# Patient Record
Sex: Male | Born: 1940 | Race: White | Hispanic: No | Marital: Married | Smoking: Former smoker
Health system: Southern US, Community
[De-identification: ages and names within clinical notes are randomized; demographics above are authoritative.]

## PROBLEM LIST (undated history)

## (undated) DIAGNOSIS — K760 Fatty (change of) liver, not elsewhere classified: Secondary | ICD-10-CM

## (undated) DIAGNOSIS — M47814 Spondylosis without myelopathy or radiculopathy, thoracic region: Secondary | ICD-10-CM

## (undated) DIAGNOSIS — D649 Anemia, unspecified: Secondary | ICD-10-CM

## (undated) DIAGNOSIS — R233 Spontaneous ecchymoses: Secondary | ICD-10-CM

## (undated) DIAGNOSIS — Z923 Personal history of irradiation: Secondary | ICD-10-CM

## (undated) DIAGNOSIS — N200 Calculus of kidney: Secondary | ICD-10-CM

## (undated) DIAGNOSIS — Z460 Encounter for fitting and adjustment of spectacles and contact lenses: Secondary | ICD-10-CM

## (undated) DIAGNOSIS — J449 Chronic obstructive pulmonary disease, unspecified: Secondary | ICD-10-CM

## (undated) DIAGNOSIS — G620 Drug-induced polyneuropathy: Secondary | ICD-10-CM

## (undated) DIAGNOSIS — N529 Male erectile dysfunction, unspecified: Secondary | ICD-10-CM

## (undated) DIAGNOSIS — R911 Solitary pulmonary nodule: Secondary | ICD-10-CM

## (undated) DIAGNOSIS — K625 Hemorrhage of anus and rectum: Secondary | ICD-10-CM

## (undated) DIAGNOSIS — R0602 Shortness of breath: Secondary | ICD-10-CM

## (undated) DIAGNOSIS — R238 Other skin changes: Secondary | ICD-10-CM

## (undated) DIAGNOSIS — E785 Hyperlipidemia, unspecified: Secondary | ICD-10-CM

## (undated) DIAGNOSIS — K5790 Diverticulosis of intestine, part unspecified, without perforation or abscess without bleeding: Secondary | ICD-10-CM

## (undated) DIAGNOSIS — Z86010 Personal history of colonic polyps: Secondary | ICD-10-CM

## (undated) DIAGNOSIS — T451X5A Adverse effect of antineoplastic and immunosuppressive drugs, initial encounter: Secondary | ICD-10-CM

## (undated) DIAGNOSIS — C189 Malignant neoplasm of colon, unspecified: Secondary | ICD-10-CM

## (undated) DIAGNOSIS — C801 Malignant (primary) neoplasm, unspecified: Secondary | ICD-10-CM

## (undated) DIAGNOSIS — C3490 Malignant neoplasm of unspecified part of unspecified bronchus or lung: Secondary | ICD-10-CM

## (undated) HISTORY — PX: SUPERFICIAL PERONEAL NERVE RELEASE: SHX6200

## (undated) HISTORY — DX: Anemia, unspecified: D64.9

## (undated) HISTORY — DX: Personal history of colonic polyps: Z86.010

## (undated) HISTORY — DX: Drug-induced polyneuropathy: G62.0

## (undated) HISTORY — DX: Spondylosis without myelopathy or radiculopathy, thoracic region: M47.814

## (undated) HISTORY — DX: Fatty (change of) liver, not elsewhere classified: K76.0

## (undated) HISTORY — PX: COLONOSCOPY: SHX174

## (undated) HISTORY — DX: Chronic obstructive pulmonary disease, unspecified: J44.9

## (undated) HISTORY — DX: Other skin changes: R23.8

## (undated) HISTORY — DX: Solitary pulmonary nodule: R91.1

## (undated) HISTORY — DX: Male erectile dysfunction, unspecified: N52.9

## (undated) HISTORY — DX: Hyperlipidemia, unspecified: E78.5

## (undated) HISTORY — DX: Shortness of breath: R06.02

## (undated) HISTORY — PX: CORNEAL TRANSPLANT: SHX108

## (undated) HISTORY — PX: POLYPECTOMY: SHX149

## (undated) HISTORY — DX: Encounter for fitting and adjustment of spectacles and contact lenses: Z46.0

## (undated) HISTORY — PX: MOUTH SURGERY: SHX715

## (undated) HISTORY — DX: Malignant neoplasm of colon, unspecified: C18.9

## (undated) HISTORY — DX: Diverticulosis of intestine, part unspecified, without perforation or abscess without bleeding: K57.90

## (undated) HISTORY — PX: TONSILLECTOMY: SUR1361

## (undated) HISTORY — DX: Hemorrhage of anus and rectum: K62.5

## (undated) HISTORY — DX: Adverse effect of antineoplastic and immunosuppressive drugs, initial encounter: T45.1X5A

## (undated) HISTORY — DX: Calculus of kidney: N20.0

## (undated) HISTORY — DX: Spontaneous ecchymoses: R23.3

## (undated) HISTORY — DX: Malignant neoplasm of unspecified part of unspecified bronchus or lung: C34.90

---

## 2006-03-18 ENCOUNTER — Ambulatory Visit (HOSPITAL_COMMUNITY): Admission: RE | Admit: 2006-03-18 | Discharge: 2006-03-18 | Payer: Self-pay | Admitting: Urology

## 2006-03-27 ENCOUNTER — Ambulatory Visit (HOSPITAL_BASED_OUTPATIENT_CLINIC_OR_DEPARTMENT_OTHER): Admission: RE | Admit: 2006-03-27 | Discharge: 2006-03-27 | Payer: Self-pay | Admitting: Urology

## 2006-04-16 ENCOUNTER — Ambulatory Visit (HOSPITAL_COMMUNITY): Admission: RE | Admit: 2006-04-16 | Discharge: 2006-04-16 | Payer: Self-pay | Admitting: Thoracic Surgery

## 2006-04-24 ENCOUNTER — Ambulatory Visit: Payer: Self-pay | Admitting: Oncology

## 2006-04-28 ENCOUNTER — Emergency Department (HOSPITAL_COMMUNITY): Admission: EM | Admit: 2006-04-28 | Discharge: 2006-04-28 | Payer: Self-pay | Admitting: Emergency Medicine

## 2006-04-29 LAB — CBC WITH DIFFERENTIAL/PLATELET
Basophils Absolute: 0 10*3/uL (ref 0.0–0.1)
EOS%: 0 % (ref 0.0–7.0)
Eosinophils Absolute: 0 10*3/uL (ref 0.0–0.5)
HGB: 15.6 g/dL (ref 13.0–17.1)
LYMPH%: 6.2 % — ABNORMAL LOW (ref 14.0–48.0)
MCH: 30.3 pg (ref 28.0–33.4)
MCV: 88.4 fL (ref 81.6–98.0)
MONO%: 4.4 % (ref 0.0–13.0)
NEUT#: 16.3 10*3/uL — ABNORMAL HIGH (ref 1.5–6.5)
Platelets: 271 10*3/uL (ref 145–400)

## 2006-04-29 LAB — COMPREHENSIVE METABOLIC PANEL
ALT: 16 U/L (ref 0–40)
AST: 14 U/L (ref 0–37)
Albumin: 4.3 g/dL (ref 3.5–5.2)
Alkaline Phosphatase: 69 U/L (ref 39–117)
Calcium: 9.3 mg/dL (ref 8.4–10.5)
Chloride: 106 mEq/L (ref 96–112)
Potassium: 4 mEq/L (ref 3.5–5.3)

## 2006-05-14 ENCOUNTER — Ambulatory Visit: Admission: RE | Admit: 2006-05-14 | Discharge: 2006-08-12 | Payer: Self-pay | Admitting: Radiation Oncology

## 2006-05-24 HISTORY — PX: LUNG REMOVAL, PARTIAL: SHX233

## 2006-05-28 ENCOUNTER — Encounter (INDEPENDENT_AMBULATORY_CARE_PROVIDER_SITE_OTHER): Payer: Self-pay | Admitting: *Deleted

## 2006-05-28 ENCOUNTER — Inpatient Hospital Stay (HOSPITAL_COMMUNITY): Admission: RE | Admit: 2006-05-28 | Discharge: 2006-06-02 | Payer: Self-pay | Admitting: Thoracic Surgery

## 2006-05-28 ENCOUNTER — Ambulatory Visit: Payer: Self-pay | Admitting: Emergency Medicine

## 2006-05-28 HISTORY — PX: OTHER SURGICAL HISTORY: SHX169

## 2006-06-06 ENCOUNTER — Ambulatory Visit: Payer: Self-pay | Admitting: Emergency Medicine

## 2006-06-11 ENCOUNTER — Encounter: Admission: RE | Admit: 2006-06-11 | Discharge: 2006-06-11 | Payer: Self-pay | Admitting: Thoracic Surgery

## 2006-07-12 ENCOUNTER — Ambulatory Visit: Payer: Self-pay | Admitting: Emergency Medicine

## 2006-07-23 ENCOUNTER — Ambulatory Visit: Payer: Self-pay | Admitting: Oncology

## 2006-07-24 ENCOUNTER — Encounter: Admission: RE | Admit: 2006-07-24 | Discharge: 2006-07-24 | Payer: Self-pay | Admitting: Thoracic Surgery

## 2006-07-24 HISTORY — PX: OTHER SURGICAL HISTORY: SHX169

## 2006-07-24 LAB — CBC WITH DIFFERENTIAL/PLATELET
BASO%: 0.4 % (ref 0.0–2.0)
Basophils Absolute: 0 10*3/uL (ref 0.0–0.1)
EOS%: 5.6 % (ref 0.0–7.0)
MCH: 30.1 pg (ref 28.0–33.4)
MCHC: 34.2 g/dL (ref 32.0–35.9)
MCV: 87.9 fL (ref 81.6–98.0)
MONO%: 6.1 % (ref 0.0–13.0)
RBC: 5.09 10*6/uL (ref 4.20–5.71)
RDW: 14.7 % — ABNORMAL HIGH (ref 11.2–14.6)
lymph#: 0.9 10*3/uL (ref 0.9–3.3)

## 2006-07-24 LAB — COMPREHENSIVE METABOLIC PANEL
ALT: 13 U/L (ref 0–40)
AST: 12 U/L (ref 0–37)
Albumin: 4.2 g/dL (ref 3.5–5.2)
Alkaline Phosphatase: 84 U/L (ref 39–117)
BUN: 15 mg/dL (ref 6–23)
Calcium: 9.3 mg/dL (ref 8.4–10.5)
Chloride: 107 mEq/L (ref 96–112)
Potassium: 3.6 mEq/L (ref 3.5–5.3)

## 2006-07-26 ENCOUNTER — Encounter: Admission: RE | Admit: 2006-07-26 | Discharge: 2006-07-26 | Payer: Self-pay | Admitting: Oncology

## 2006-08-09 ENCOUNTER — Inpatient Hospital Stay (HOSPITAL_COMMUNITY): Admission: RE | Admit: 2006-08-09 | Discharge: 2006-08-11 | Payer: Self-pay | Admitting: Urology

## 2006-09-06 ENCOUNTER — Ambulatory Visit: Payer: Self-pay | Admitting: Emergency Medicine

## 2006-09-06 LAB — PULMONARY FUNCTION TEST

## 2006-10-23 ENCOUNTER — Encounter: Admission: RE | Admit: 2006-10-23 | Discharge: 2006-10-23 | Payer: Self-pay | Admitting: Thoracic Surgery

## 2006-11-01 ENCOUNTER — Ambulatory Visit: Payer: Self-pay | Admitting: Oncology

## 2006-11-05 ENCOUNTER — Ambulatory Visit (HOSPITAL_COMMUNITY): Admission: RE | Admit: 2006-11-05 | Discharge: 2006-11-05 | Payer: Self-pay | Admitting: Oncology

## 2006-11-05 LAB — COMPREHENSIVE METABOLIC PANEL
AST: 20 U/L (ref 0–37)
BUN: 15 mg/dL (ref 6–23)
Calcium: 9.4 mg/dL (ref 8.4–10.5)
Chloride: 106 mEq/L (ref 96–112)
Creatinine, Ser: 0.97 mg/dL (ref 0.40–1.50)
Total Bilirubin: 0.9 mg/dL (ref 0.3–1.2)

## 2006-11-05 LAB — LACTATE DEHYDROGENASE: LDH: 190 U/L (ref 94–250)

## 2006-11-05 LAB — CBC WITH DIFFERENTIAL/PLATELET
Basophils Absolute: 0 10*3/uL (ref 0.0–0.1)
EOS%: 3 % (ref 0.0–7.0)
HCT: 44.9 % (ref 38.7–49.9)
HGB: 15.8 g/dL (ref 13.0–17.1)
LYMPH%: 12.9 % — ABNORMAL LOW (ref 14.0–48.0)
MCH: 30.3 pg (ref 28.0–33.4)
MCV: 86.3 fL (ref 81.6–98.0)
MONO%: 7.7 % (ref 0.0–13.0)
NEUT%: 76.1 % — ABNORMAL HIGH (ref 40.0–75.0)
Platelets: 242 10*3/uL (ref 145–400)

## 2006-11-19 ENCOUNTER — Ambulatory Visit: Payer: Self-pay | Admitting: Emergency Medicine

## 2007-01-22 ENCOUNTER — Encounter: Admission: RE | Admit: 2007-01-22 | Discharge: 2007-01-22 | Payer: Self-pay | Admitting: Thoracic Surgery

## 2007-01-29 ENCOUNTER — Ambulatory Visit (HOSPITAL_COMMUNITY): Admission: RE | Admit: 2007-01-29 | Discharge: 2007-01-29 | Payer: Self-pay | Admitting: Urology

## 2007-02-04 ENCOUNTER — Ambulatory Visit: Payer: Self-pay | Admitting: Oncology

## 2007-02-07 ENCOUNTER — Ambulatory Visit (HOSPITAL_COMMUNITY): Admission: RE | Admit: 2007-02-07 | Discharge: 2007-02-07 | Payer: Self-pay | Admitting: Oncology

## 2007-02-07 LAB — CBC WITH DIFFERENTIAL/PLATELET
Basophils Absolute: 0 10*3/uL (ref 0.0–0.1)
EOS%: 4.2 % (ref 0.0–7.0)
HGB: 16.5 g/dL (ref 13.0–17.1)
LYMPH%: 12.4 % — ABNORMAL LOW (ref 14.0–48.0)
MCH: 30.3 pg (ref 28.0–33.4)
MCV: 85.2 fL (ref 81.6–98.0)
MONO%: 7.8 % (ref 0.0–13.0)
Platelets: 243 10*3/uL (ref 145–400)
RBC: 5.43 10*6/uL (ref 4.20–5.71)
RDW: 14 % (ref 11.2–14.6)

## 2007-02-07 LAB — COMPREHENSIVE METABOLIC PANEL
AST: 18 U/L (ref 0–37)
Albumin: 4.2 g/dL (ref 3.5–5.2)
Alkaline Phosphatase: 71 U/L (ref 39–117)
BUN: 16 mg/dL (ref 6–23)
Glucose, Bld: 149 mg/dL — ABNORMAL HIGH (ref 70–99)
Potassium: 4.3 mEq/L (ref 3.5–5.3)
Total Bilirubin: 0.8 mg/dL (ref 0.3–1.2)

## 2007-04-16 ENCOUNTER — Ambulatory Visit: Payer: Self-pay | Admitting: Gastroenterology

## 2007-04-30 ENCOUNTER — Ambulatory Visit: Payer: Self-pay | Admitting: Gastroenterology

## 2007-04-30 ENCOUNTER — Encounter: Payer: Self-pay | Admitting: Gastroenterology

## 2007-04-30 DIAGNOSIS — Z860101 Personal history of adenomatous and serrated colon polyps: Secondary | ICD-10-CM

## 2007-04-30 DIAGNOSIS — Z86010 Personal history of colonic polyps: Secondary | ICD-10-CM

## 2007-04-30 HISTORY — DX: Personal history of colonic polyps: Z86.010

## 2007-04-30 HISTORY — DX: Personal history of adenomatous and serrated colon polyps: Z86.0101

## 2007-07-23 ENCOUNTER — Encounter: Admission: RE | Admit: 2007-07-23 | Discharge: 2007-07-23 | Payer: Self-pay | Admitting: Thoracic Surgery

## 2007-07-23 ENCOUNTER — Ambulatory Visit: Payer: Self-pay | Admitting: Thoracic Surgery

## 2007-08-04 ENCOUNTER — Ambulatory Visit: Payer: Self-pay | Admitting: Oncology

## 2007-08-06 ENCOUNTER — Ambulatory Visit (HOSPITAL_COMMUNITY): Admission: RE | Admit: 2007-08-06 | Discharge: 2007-08-06 | Payer: Self-pay | Admitting: Oncology

## 2007-08-06 LAB — CBC WITH DIFFERENTIAL/PLATELET
Basophils Absolute: 0 10*3/uL (ref 0.0–0.1)
Eosinophils Absolute: 0.4 10*3/uL (ref 0.0–0.5)
HCT: 44.3 % (ref 38.7–49.9)
HGB: 16.1 g/dL (ref 13.0–17.1)
LYMPH%: 18.6 % (ref 14.0–48.0)
MCV: 83.9 fL (ref 81.6–98.0)
MONO#: 0.7 10*3/uL (ref 0.1–0.9)
MONO%: 8 % (ref 0.0–13.0)
NEUT#: 5.7 10*3/uL (ref 1.5–6.5)
NEUT%: 68.5 % (ref 40.0–75.0)
Platelets: 236 10*3/uL (ref 145–400)
RBC: 5.28 10*6/uL (ref 4.20–5.71)
WBC: 8.3 10*3/uL (ref 4.0–10.0)

## 2007-08-06 LAB — COMPREHENSIVE METABOLIC PANEL
ALT: 45 U/L (ref 0–53)
AST: 30 U/L (ref 0–37)
Calcium: 8.9 mg/dL (ref 8.4–10.5)
Chloride: 105 mEq/L (ref 96–112)
Creatinine, Ser: 0.86 mg/dL (ref 0.40–1.50)
Potassium: 3.7 mEq/L (ref 3.5–5.3)

## 2007-08-20 ENCOUNTER — Ambulatory Visit (HOSPITAL_COMMUNITY): Admission: RE | Admit: 2007-08-20 | Discharge: 2007-08-20 | Payer: Self-pay | Admitting: Oncology

## 2007-10-14 DIAGNOSIS — J309 Allergic rhinitis, unspecified: Secondary | ICD-10-CM | POA: Insufficient documentation

## 2007-10-14 DIAGNOSIS — Z947 Corneal transplant status: Secondary | ICD-10-CM | POA: Insufficient documentation

## 2007-10-14 DIAGNOSIS — Z87891 Personal history of nicotine dependence: Secondary | ICD-10-CM

## 2007-10-14 DIAGNOSIS — R0902 Hypoxemia: Secondary | ICD-10-CM

## 2007-10-14 DIAGNOSIS — Z87442 Personal history of urinary calculi: Secondary | ICD-10-CM | POA: Insufficient documentation

## 2007-10-14 DIAGNOSIS — J439 Emphysema, unspecified: Secondary | ICD-10-CM

## 2007-10-14 DIAGNOSIS — C3490 Malignant neoplasm of unspecified part of unspecified bronchus or lung: Secondary | ICD-10-CM | POA: Insufficient documentation

## 2007-11-06 ENCOUNTER — Ambulatory Visit: Payer: Self-pay | Admitting: Oncology

## 2007-11-18 ENCOUNTER — Ambulatory Visit: Admission: RE | Admit: 2007-11-18 | Discharge: 2007-12-23 | Payer: Self-pay | Admitting: Radiation Oncology

## 2007-11-26 ENCOUNTER — Ambulatory Visit (HOSPITAL_COMMUNITY): Admission: RE | Admit: 2007-11-26 | Discharge: 2007-11-26 | Payer: Self-pay | Admitting: Urology

## 2007-12-22 ENCOUNTER — Ambulatory Visit: Payer: Self-pay | Admitting: Oncology

## 2007-12-22 LAB — CBC WITH DIFFERENTIAL/PLATELET
Basophils Absolute: 0 10*3/uL (ref 0.0–0.1)
EOS%: 2.6 % (ref 0.0–7.0)
HGB: 15.8 g/dL (ref 13.0–17.1)
LYMPH%: 16.7 % (ref 14.0–48.0)
MCH: 29.4 pg (ref 28.0–33.4)
MCV: 85.1 fL (ref 81.6–98.0)
MONO%: 6.6 % (ref 0.0–13.0)
NEUT%: 73.9 % (ref 40.0–75.0)
RDW: 14.7 % — ABNORMAL HIGH (ref 11.2–14.6)

## 2007-12-22 LAB — COMPREHENSIVE METABOLIC PANEL
AST: 28 U/L (ref 0–37)
Alkaline Phosphatase: 59 U/L (ref 39–117)
BUN: 15 mg/dL (ref 6–23)
Creatinine, Ser: 0.97 mg/dL (ref 0.40–1.50)
Potassium: 4.1 mEq/L (ref 3.5–5.3)
Total Bilirubin: 0.8 mg/dL (ref 0.3–1.2)

## 2007-12-31 ENCOUNTER — Ambulatory Visit (HOSPITAL_COMMUNITY): Admission: RE | Admit: 2007-12-31 | Discharge: 2007-12-31 | Payer: Self-pay | Admitting: Internal Medicine

## 2008-01-14 ENCOUNTER — Ambulatory Visit: Payer: Self-pay | Admitting: Thoracic Surgery

## 2008-06-07 ENCOUNTER — Ambulatory Visit: Payer: Self-pay | Admitting: Oncology

## 2008-06-09 ENCOUNTER — Ambulatory Visit (HOSPITAL_COMMUNITY): Admission: RE | Admit: 2008-06-09 | Discharge: 2008-06-09 | Payer: Self-pay | Admitting: Oncology

## 2008-06-09 LAB — COMPREHENSIVE METABOLIC PANEL
ALT: 31 U/L (ref 0–53)
AST: 20 U/L (ref 0–37)
Creatinine, Ser: 0.89 mg/dL (ref 0.40–1.50)
Sodium: 139 mEq/L (ref 135–145)
Total Bilirubin: 0.7 mg/dL (ref 0.3–1.2)
Total Protein: 6.6 g/dL (ref 6.0–8.3)

## 2008-06-09 LAB — CBC WITH DIFFERENTIAL/PLATELET
BASO%: 0.3 % (ref 0.0–2.0)
EOS%: 4.7 % (ref 0.0–7.0)
HCT: 43.3 % (ref 38.7–49.9)
LYMPH%: 19.1 % (ref 14.0–48.0)
MCH: 30.5 pg (ref 28.0–33.4)
MCHC: 34.9 g/dL (ref 32.0–35.9)
NEUT%: 68.8 % (ref 40.0–75.0)
Platelets: 253 10*3/uL (ref 145–400)
RBC: 4.95 10*6/uL (ref 4.20–5.71)
WBC: 8.8 10*3/uL (ref 4.0–10.0)
lymph#: 1.7 10*3/uL (ref 0.9–3.3)

## 2008-06-16 ENCOUNTER — Ambulatory Visit: Payer: Self-pay | Admitting: Thoracic Surgery

## 2008-09-06 ENCOUNTER — Ambulatory Visit: Payer: Self-pay | Admitting: Oncology

## 2008-09-08 ENCOUNTER — Ambulatory Visit (HOSPITAL_COMMUNITY): Admission: RE | Admit: 2008-09-08 | Discharge: 2008-09-08 | Payer: Self-pay | Admitting: Oncology

## 2008-09-08 LAB — CBC WITH DIFFERENTIAL/PLATELET
BASO%: 0.1 % (ref 0.0–2.0)
Eosinophils Absolute: 0.4 10*3/uL (ref 0.0–0.5)
HCT: 45.3 % (ref 38.7–49.9)
HGB: 16 g/dL (ref 13.0–17.1)
LYMPH%: 15.2 % (ref 14.0–48.0)
MCHC: 35.2 g/dL (ref 32.0–35.9)
MONO#: 0.7 10*3/uL (ref 0.1–0.9)
NEUT#: 6.6 10*3/uL — ABNORMAL HIGH (ref 1.5–6.5)
NEUT%: 72.6 % (ref 40.0–75.0)
Platelets: 216 10*3/uL (ref 145–400)
WBC: 9.1 10*3/uL (ref 4.0–10.0)
lymph#: 1.4 10*3/uL (ref 0.9–3.3)

## 2008-09-08 LAB — COMPREHENSIVE METABOLIC PANEL
ALT: 39 U/L (ref 0–53)
CO2: 24 mEq/L (ref 19–32)
Calcium: 9.9 mg/dL (ref 8.4–10.5)
Chloride: 108 mEq/L (ref 96–112)
Creatinine, Ser: 0.9 mg/dL (ref 0.40–1.50)
Glucose, Bld: 155 mg/dL — ABNORMAL HIGH (ref 70–99)
Total Protein: 6.7 g/dL (ref 6.0–8.3)

## 2008-09-08 LAB — LACTATE DEHYDROGENASE: LDH: 144 U/L (ref 94–250)

## 2008-09-15 ENCOUNTER — Ambulatory Visit: Payer: Self-pay | Admitting: Thoracic Surgery

## 2008-11-29 ENCOUNTER — Ambulatory Visit: Payer: Self-pay | Admitting: Thoracic Surgery

## 2008-12-06 ENCOUNTER — Ambulatory Visit: Payer: Self-pay | Admitting: Oncology

## 2008-12-08 ENCOUNTER — Ambulatory Visit (HOSPITAL_COMMUNITY): Admission: RE | Admit: 2008-12-08 | Discharge: 2008-12-08 | Payer: Self-pay | Admitting: Oncology

## 2008-12-08 LAB — BASIC METABOLIC PANEL
Calcium: 9.7 mg/dL (ref 8.4–10.5)
Glucose, Bld: 167 mg/dL — ABNORMAL HIGH (ref 70–99)
Potassium: 4 mEq/L (ref 3.5–5.3)
Sodium: 140 mEq/L (ref 135–145)

## 2008-12-22 ENCOUNTER — Ambulatory Visit: Payer: Self-pay | Admitting: Thoracic Surgery

## 2009-04-25 ENCOUNTER — Ambulatory Visit: Payer: Self-pay | Admitting: Oncology

## 2009-04-27 ENCOUNTER — Ambulatory Visit (HOSPITAL_COMMUNITY): Admission: RE | Admit: 2009-04-27 | Discharge: 2009-04-27 | Payer: Self-pay | Admitting: Oncology

## 2009-04-27 LAB — COMPREHENSIVE METABOLIC PANEL
Albumin: 3.6 g/dL (ref 3.5–5.2)
Alkaline Phosphatase: 52 U/L (ref 39–117)
BUN: 11 mg/dL (ref 6–23)
CO2: 24 mEq/L (ref 19–32)
Calcium: 9.5 mg/dL (ref 8.4–10.5)
Chloride: 107 mEq/L (ref 96–112)
Glucose, Bld: 125 mg/dL — ABNORMAL HIGH (ref 70–99)
Potassium: 4.1 mEq/L (ref 3.5–5.3)
Sodium: 138 mEq/L (ref 135–145)
Total Protein: 6.4 g/dL (ref 6.0–8.3)

## 2009-04-27 LAB — CBC WITH DIFFERENTIAL/PLATELET
Basophils Absolute: 0 10*3/uL (ref 0.0–0.1)
EOS%: 4.8 % (ref 0.0–7.0)
Eosinophils Absolute: 0.4 10*3/uL (ref 0.0–0.5)
HGB: 15 g/dL (ref 13.0–17.1)
MCV: 86.7 fL (ref 79.3–98.0)
MONO%: 6.5 % (ref 0.0–14.0)
NEUT#: 6.2 10*3/uL (ref 1.5–6.5)
RBC: 5.11 10*6/uL (ref 4.20–5.82)
RDW: 14.3 % (ref 11.0–14.6)
WBC: 8.8 10*3/uL (ref 4.0–10.3)
lymph#: 1.6 10*3/uL (ref 0.9–3.3)

## 2009-04-27 LAB — LACTATE DEHYDROGENASE: LDH: 162 U/L (ref 94–250)

## 2009-05-18 ENCOUNTER — Ambulatory Visit: Payer: Self-pay | Admitting: Thoracic Surgery

## 2009-11-07 ENCOUNTER — Ambulatory Visit: Payer: Self-pay | Admitting: Oncology

## 2009-11-09 ENCOUNTER — Ambulatory Visit (HOSPITAL_COMMUNITY): Admission: RE | Admit: 2009-11-09 | Discharge: 2009-11-09 | Payer: Self-pay | Admitting: Oncology

## 2009-11-09 LAB — CBC WITH DIFFERENTIAL/PLATELET
Basophils Absolute: 0 10*3/uL (ref 0.0–0.1)
EOS%: 3.9 % (ref 0.0–7.0)
Eosinophils Absolute: 0.3 10*3/uL (ref 0.0–0.5)
HCT: 45.2 % (ref 38.4–49.9)
HGB: 15.4 g/dL (ref 13.0–17.1)
MCH: 30.4 pg (ref 27.2–33.4)
MONO#: 0.6 10*3/uL (ref 0.1–0.9)
NEUT#: 5.9 10*3/uL (ref 1.5–6.5)
NEUT%: 69.3 % (ref 39.0–75.0)
RDW: 14.4 % (ref 11.0–14.6)
lymph#: 1.7 10*3/uL (ref 0.9–3.3)

## 2009-11-09 LAB — COMPREHENSIVE METABOLIC PANEL
Albumin: 3.8 g/dL (ref 3.5–5.2)
BUN: 15 mg/dL (ref 6–23)
CO2: 22 mEq/L (ref 19–32)
Calcium: 9.6 mg/dL (ref 8.4–10.5)
Chloride: 107 mEq/L (ref 96–112)
Creatinine, Ser: 1.07 mg/dL (ref 0.40–1.50)
Glucose, Bld: 121 mg/dL — ABNORMAL HIGH (ref 70–99)
Potassium: 4 mEq/L (ref 3.5–5.3)

## 2009-11-09 LAB — LACTATE DEHYDROGENASE: LDH: 167 U/L (ref 94–250)

## 2010-05-10 ENCOUNTER — Ambulatory Visit (HOSPITAL_COMMUNITY): Admission: RE | Admit: 2010-05-10 | Discharge: 2010-05-10 | Payer: Self-pay | Admitting: Oncology

## 2010-05-15 ENCOUNTER — Ambulatory Visit: Payer: Self-pay | Admitting: Oncology

## 2010-07-12 ENCOUNTER — Ambulatory Visit (HOSPITAL_COMMUNITY): Admission: RE | Admit: 2010-07-12 | Discharge: 2010-07-12 | Payer: Self-pay | Admitting: Surgery

## 2010-08-02 ENCOUNTER — Ambulatory Visit (HOSPITAL_COMMUNITY): Admission: RE | Admit: 2010-08-02 | Discharge: 2010-08-02 | Payer: Self-pay | Admitting: Surgery

## 2010-11-20 ENCOUNTER — Ambulatory Visit: Payer: Self-pay | Admitting: Oncology

## 2010-11-22 ENCOUNTER — Ambulatory Visit (HOSPITAL_COMMUNITY)
Admission: RE | Admit: 2010-11-22 | Discharge: 2010-11-22 | Payer: Self-pay | Source: Home / Self Care | Admitting: Oncology

## 2010-11-22 LAB — CBC WITH DIFFERENTIAL/PLATELET
Basophils Absolute: 0 10*3/uL (ref 0.0–0.1)
Eosinophils Absolute: 0.3 10*3/uL (ref 0.0–0.5)
HCT: 41.7 % (ref 38.4–49.9)
HGB: 14.1 g/dL (ref 13.0–17.1)
LYMPH%: 17.8 % (ref 14.0–49.0)
MCHC: 33.7 g/dL (ref 32.0–36.0)
MONO#: 0.6 10*3/uL (ref 0.1–0.9)
NEUT#: 5.8 10*3/uL (ref 1.5–6.5)
NEUT%: 71.1 % (ref 39.0–75.0)
Platelets: 266 10*3/uL (ref 140–400)
WBC: 8.1 10*3/uL (ref 4.0–10.3)

## 2010-11-22 LAB — COMPREHENSIVE METABOLIC PANEL
BUN: 13 mg/dL (ref 6–23)
CO2: 27 mEq/L (ref 19–32)
Calcium: 9.8 mg/dL (ref 8.4–10.5)
Chloride: 105 mEq/L (ref 96–112)
Creatinine, Ser: 1.14 mg/dL (ref 0.40–1.50)
Glucose, Bld: 121 mg/dL — ABNORMAL HIGH (ref 70–99)
Total Bilirubin: 0.7 mg/dL (ref 0.3–1.2)

## 2010-11-22 LAB — LACTATE DEHYDROGENASE: LDH: 135 U/L (ref 94–250)

## 2011-01-12 ENCOUNTER — Other Ambulatory Visit: Payer: Self-pay | Admitting: Oncology

## 2011-01-12 DIAGNOSIS — Z85118 Personal history of other malignant neoplasm of bronchus and lung: Secondary | ICD-10-CM

## 2011-01-14 ENCOUNTER — Encounter: Payer: Self-pay | Admitting: Thoracic Surgery

## 2011-01-14 ENCOUNTER — Encounter: Payer: Self-pay | Admitting: Urology

## 2011-03-09 LAB — BUN: BUN: 11 mg/dL (ref 6–23)

## 2011-03-09 LAB — CREATININE, SERUM
Creatinine, Ser: 1.02 mg/dL (ref 0.4–1.5)
GFR calc non Af Amer: >60 mL/min (ref >60)

## 2011-05-08 NOTE — Letter (Signed)
September 15, 2008   [name]. Cyndie Chime, MD  [address] [facility]  [place], [place] [address]   Re:  [name], HOCEVAR                  DOB:  January 26, 1941   Dear [name]:   I saw the patient in the office today and he and I had a long discussion  regarding his PET and CT scan findings.  I am somewhat concerned that he  may have a recurrence in 2 years, it is usually the time we see  recurrences after this procedure.  Since there is a little more  thickening and with the PET scan showing an uptake of 2.7, I think it is  still not for sure.  Whatever it is, it is probably a slow-going  procedure.  I think waiting 3 months to repeat the CT is the right idea.  Should it increase in size in the next 3 months, then I think we need to  do a needle biopsy and then need to consider him for redo surgery or  just external beam radiation to that area.  I appreciate the opportunity  of seeing the patient, and I discussed this in detail with him, and I  think he agrees with the plan.  His blood pressure is 124/77, pulse 90,  respirations 20, and sats were 95%.   Ines Bloomer, M.D.  Electronically Signed   DPB/MEDQ  D:  09/15/2008  T:  09/16/2008  Job:  [id number]

## 2011-05-08 NOTE — Letter (Signed)
July 23, 2007   Cephas Darby, MD  [address] [facility]  [place], [place] [address]   Dear Rosanne Ashing,   Re:  Consepcion Hearing E                  DOB:  [birth year]   I saw this patient here in the office today and we got a chest x-ray.  It says that there was a questionable new focal opacity in the right  upper lobe that looks like atelectasis or possible scar.  You have a CT  scan scheduled for August 13 so  I will just followup on this finding.  The side that we operated on the left side looks good with no evidence  of recurrence.  I did not see the nodule on his chest x-ray in the right  lower lobe.  I will see him back in 6 months with a chest x-ray.  His  blood pressure is 132/79, pulse 100, respirations 18, sats 92%.  Please  let me know the significant findings of this patient's CT scan.   Ines Bloomer, M.D.  Electronically Signed   DPB/MEDQ  D:  07/23/2007  T:  07/24/2007  Job:  [id number]

## 2011-05-08 NOTE — Letter (Signed)
January 14, 2008   [name]. Cyndie Chime, M.D.  [address] [facility]  [place], [place] [address]   Re:  [name], PURDUM                  DOB:  Oct 14, 1941   Dear Rosanne Ashing:   I saw [name] back in the office today.  His blood pressure is 135/81,  pulse 98, respirations 18, saturation 98%.  CT,  scan is as you thought,  that the left side is stable.  On the right side, the area of concern,  is improved and the other area has not changed.  The right lower lobe  nodular density was actually slightly smaller than previously recorded.   I think we need to continue to follow him with CTs, and I will see him  back again in June when he has his next CT scan.  It is almost two years  now since we did his surgery.   Ines Bloomer, M.D.  Electronically Signed   DPB/MEDQ  D:  01/14/2008  T:  01/14/2008  Job:  [id number]

## 2011-05-08 NOTE — Letter (Signed)
December 22, 2008   [name]. Sean Chime, MD  [address] [facility]  [place], [place] [address]   Re:  [name], [name]                  DOB:  1941-11-01   Dear Rosanne Ashing,   I saw the patient back in the office today and reviewed his CT scan  results with him.  I think on the right side everything appears to be  stable and on the left side there is scarring around the area where the  seeds were little worrisome, but I think, since this has been stable, we  can discontinue to watch it.  I agree with getting another CT scan in  May.  His blood pressure was 129/80, pulse 77, respirations 18, and sats  were 93%.  I appreciate the opportunity of seeing the patient.   Sincerely.   Ines Bloomer, M.D.  Electronically Signed   DPB/MEDQ  D:  12/22/2008  T:  12/23/2008  Job:  [id number]

## 2011-05-08 NOTE — Letter (Signed)
June 16, 2008   [name]. Cyndie Chime, MD  [address] [facility]  [place], [place] [address]   Re:  [name], CARRIGAN                  DOB:  07-11-1941   Dear Rosanne Ashing,   I saw [name] back in the office today after his CT scan, and it  unfortunately shows his right lower lobe lesion, which had a SUV of 2.2,  has increased from 1.5 to 1.5 x 1.9.  So, it is a definite increase in  size.  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  The lesion in the right  upper lobe is not changed, so I have discussed in great detail with [name].  I have recommended he have wedge resection and seed implantation  of the right lower lobe lesion if it is cancer and possibly resection of  the right upper lobe lesion.  He will see you in July.  Just let me know  what your thoughts are and other possibilities to follow this for  another 3 months.   Ines Bloomer, M.D.  Electronically Signed   DPB/MEDQ  D:  06/16/2008  T:  06/16/2008  Job:  [id number]

## 2011-05-08 NOTE — Letter (Signed)
May 18, 2009   [name]. Cyndie Chime, MD  [address] [facility]  [place], [place] [address]   Re:  [name], TENBRINK                  DOB:  06-01-1941   Dear Dr. Cyndie Chime:   The patient returns today.  He is now over 2 years since his last  surgery.  His CT scan is stable with no evidence of recurrence.  The  previous area in the left upper lobe as well as right lower lobe  apparently has improved.  He has another CT scan scheduled in 6 months.  All I saw was just post treatment changes in left upper lobe and stable  ill-defined airspace opacities in the right upper and right lower lobe.  We will refer him back to you for longterm followup, but we are happy to  see him again should any other questions develop regarding his lung  nodules.  The one in the right lower lobe that we are following has not  changed in 2 years, would assume is stable.   Sincerely,   Ines Bloomer, M.D.  Electronically Signed   DPB/MEDQ  D:  05/18/2009  T:  05/19/2009  Job:  [id number]

## 2011-05-11 NOTE — Assessment & Plan Note (Signed)
[facility]                             PULMONARY OFFICE NOTE   NAME:Garraway, WINSON EICHORN                         MRN:          [id number]  DATE:11/19/2006                            DOB:          1941-05-09    SUBJECTIVE:  This is a followup visit for Mr. Lage who has a history of  non-small cell lung cancer, status post wedge resection and radioactive  bead implantation by Dr. Edwyna Shell.  He has been seen in Pulmonary Clinic  for mild COPD.  Part of our evaluation included ambulatory oximetry,  which confirmed ambulatory desaturation.  He refused home oxygen, as he  has been completely asymptomatic.  He feels well without any shortness  of breath.  He has been using Spiriva for just over one month, and does  not believe that it has changed his breathing to any degree.   MEDICATIONS:  1. Fexofenadine 180 mg daily.  2. Cosopt drops to the left eye b.i.d.  3. Alphagan drops to the left eye b.i.d.  4. Travatan drops to the left eye b.i.d.  5. Claritin 10 mg daily.  6. Lotemax drops to the left eye daily.  7. Spiriva 1 inhalation daily.  8. Vytorin 5/20 mg daily.  9. Hydralazine p.r.n.   EXAMINATION:  GENERAL:  This is a pleasant, well-appearing man who is in  no distress on room air.  His weight is 206 pounds.  Temperature is 97.6.  Blood pressure 136/84.  Heart rate 93.  SGO2 93% on room air.  LUNGS:  Clear to auscultation bilaterally.  His exam is unchanged from  our previous visits.  Ambulatory oximetry was performed, and he  desaturated to 85% with just 1 lap around the office on room air.   IMPRESSION:  1. Status post wedge resection and radioactive bead implantation for      non-small cell lung cancer, stable.  He will follow up with Dr.      Edwyna Shell.  2. Mild chronic obstructive pulmonary disease and airflow limitation      without any significant change in his oxygenation or in his      clinical status, on Spiriva.  3. Exertional hypoxemia.   PLAN:  1. I will stop his Spiriva since he does not note any improvement in      symptoms on this medication, and it has not changed his oxygenation      and gas exchange.  2. I would like to prescribe supplemental oxygen for Mr. Harrison and to      initiate a workup for his occult hypoxemia, but he is not      interested in wearing supplemental oxygen at this time, as he feels      fine.  I have encouraged him to return to see me should he change      his mind, or      should any other new symptoms evolve that are consistent with      hypoxemia.  We reviewed these today.  Mr. Feagans will otherwise      follow up with me  on an as-needed basis.     Leslye Peer, MD  Electronically Signed    RSB/MedQ  DD: 11/19/2006  DT: 11/19/2006  Job #: [id number]   cc:   Ines Bloomer, M.D.  [name]. Cyndie Chime, M.D.

## 2011-05-11 NOTE — Assessment & Plan Note (Signed)
[facility]                               PULMONARY OFFICE NOTE   NAME:[name], [name]                         MRN:          [id number]  DATE:07/12/2006                            DOB:          1941/05/06    SUBJECTIVE:  [name] was a 70 year old man who has adenocarcinoma.  He  underwent VATS wedge resection and radioactive node seed implantation.  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  He returns today telling me that he  is having no breathing complaints.  He is not using oxygen with exertion  despite the fact that he desaturated on ambulation at our last visit.   MEDICATIONS:  1. Fexofenadine 180 mg daily.  2. Pred Forte eye drops to the left eye, one daily.  3. Cosopt eye drops to the left eye, one b.i.d.  4. Alphagan eye drops to the left eye b.i.d.  5. Travatan eye drops to the left eye b.i.d.  6. Claritin 10 mg every day.   PHYSICAL EXAMINATION:  GENERAL:  This is a pleasant gentleman in no  distress, on room air.  OROPHARYNX:  Clear.  LUNGS:  Clear to auscultation bilaterally.  HEART:  Regular rate and rhythm without murmur.  ABDOMEN:  Soft, nontender.  Positive bowel sounds.  EXTREMITIES:  No clubbing, cyanosis or edema.  VITAL SIGNS:  Weight 194 pounds, temperature 97.8 degrees, blood pressure  116/78, heart rate 84, SPO2 94% on room air at rest and 87% on room air  while walking.   IMPRESSION:  1. Adenocarcinoma of the lungs, status post wedge resection and      radioactive seed implantation.  2. Biopsy with some surrounding BOOP without any evidence for radiographic      BOOP on his repeat CT scan of the chest.  3. Exertional hypoxemia.   PLAN:  1. Full pulmonary function testing.  2. If his follow up CT scan in August 2007 shows infiltrates then we can      consider  repeat biopsy to rule out BOOP.  I suspect that this      represented a reactive fibrotic process around his adenocarcinoma and      that he will not have recurrence.  3. The patient needs to wear oxygen at 2 liters per minute with all      exertion, although I acknowledge that he is likely to refuse.                                   Leslye Peer, MD   RSB/MedQ  DD:  08/27/2006  DT:  08/28/2006  Job #:  [id number]   cc:   Ines Bloomer, M.D.  [name]. Cyndie Chime, M.D.

## 2011-05-11 NOTE — Op Note (Signed)
[name], [name]                  ACCOUNT NO.:  [id number]   MEDICAL RECORD NO.:  [id number]          PATIENT TYPE:  INP   LOCATION:  3312                         FACILITY:  [facility]   PHYSICIAN:  Ines Bloomer, M.D. DATE OF BIRTH:  [birth year]   DATE OF PROCEDURE:  DATE OF DISCHARGE:                                 OPERATIVE REPORT   PREOPERATIVE DIAGNOSIS:  Left upper lobe mass, right lower lobe mass.   POSTOPERATIVE DIAGNOSIS:  Adenocarcinoma, left upper lobe.   OPERATION PERFORMED:  Left video-assisted thoracoscopic surgery, wedge  resection of left upper lobe lesion with node dissection and node seed  implantation.   SURGEON:  Ines Bloomer, M.D.   FIRST ASSISTANT:  Rowe Clack, P.A.-C.   ANESTHESIA:  General.   After percutaneous insertion of all monitoring lines, the patient underwent  general anesthesia.  Was prepped and draped in the usual sterile manner.  He  was turned to the left lateral thoracotomy position.  This patient had a  previous PET scan which showed a left upper lobe, 16 mm lesion that was  positive, and a 17 mm lesion of the right lower lobe that had an SUV of less  than 2.  Because of the bilateral lesions, it was elected to do a wedge  resection with seed implantation of the left upper lobe and node dissection  and follow the right lower lobe lesion closely.  The patient agreed to the  surgery.  Was brought to the operating room and underwent general  anesthesia.  A dual lumen tube was inserted.  He was prepped and draped in  the usual sterile manner.  The left lung was deflated.  Two trocar sites  were made in the anterior and posterior axial line at the 7th intercostal  space.  Two trocars were inserted.  A 0 degree scope was inserted.  The  cancer was seen in the anterior segment of the left upper lobe.  An anterior  mini thoracotomy was made over the fifth intercostal space, and the serratus  were split.  The fifth intercostal space entered  and a small retractor was  placed to retract the tissues with minimal rib-spreading.  The lesion was  then grasped with a Sonda Primes lung clamp and resected with multiple applications  of the EZ45 stapler.  Then CoSeal was applied to the staple line after  checking it for leaks.  Sutures were then placed along the staple line, one  at the area where the cancer was, and one superior and inferiorly.  Then  dissection was started around the hilum, and the anterior hilum was  dissected out, and the 10R node dissected free.  Then dissecting superiorly,  in the aortopulmonary window, dissected out a 7 node and then another 10L  node.  After this had been done, we went posteriorly and found no nodes of  the posterior hilum or in the fissure line.  The nodes were dissected.  An  On-Q catheter was placed sub pleurally, using the scope as guidance, and  secured in place, after removing the sheath  with Steri-Strips.  Then an  intercostal nerve block was done in the usual fashion, and Marcaine was  injected into the pleural catheter.  Then the frozen section came back  adenocarcinoma, and Dr. Kathrynn Running had prepared the seed mesh, so the mesh was  then sutured to the line, placing the first stitch through the center of the  mesh and tying this down, then tying anteriorly, superiorly, and inferiorly  on the mesh, and then on four corners of the mesh to get the mesh seated.  On the four corners, we used Ligaclips to give at least a 1 cm play when the  lung expanded.  After that had been done, two chest tubes were brought in  through the trocar sites.  Pictures were then taken to document the  procedure, and with sutures in place with 0 silk, the chest was closed with  two paracostals, #1 Vicryl on the muscle layer, 2-0 Vicryl on the  subcutaneous tissue, and Dermabond for the skin.  Patient tolerated the  procedure well, was returned to the recovery room in stable condition.            ______________________________  Ines Bloomer, M.D.     DPB/MEDQ  D:  05/28/2006  T:  05/28/2006  Job:  [id number]   cc:   Artist Pais Kathrynn Running, M.D.  Fax: [phone]   Lajuana Matte, MD  Fax: [phone]   Loraine Leriche A. Perini, M.D.  Fax: [phone]

## 2011-05-11 NOTE — Assessment & Plan Note (Signed)
[facility]                               PULMONARY OFFICE NOTE   NAME:Petersen, COURTNEY FENLON                         MRN:          [id number]  DATE:09/06/2006                            DOB:          12/03/1941    SUBJECTIVE:  Mr. Lazarus is a 70 year old gentleman who is status post a lung  resection and seed implantation for left upper lobe lung cancer.  His biopsy  at that time showed some surrounding BOOP on pathology.  He returns today  after having his pulmonary function testing performed and to review the  results of his CT scan from August 2007.  He tells me that his breathing is  doing fine and he does not have any complaints.  On further questioning, he  does say that he has some exertional shortness of breath unless he paces  himself.   MEDICATIONS:  1. Fexofenadine 180 mg daily.  2. Zyrtec 10 mg daily.  3. Cosopt eye drops.  4. Alphagan eye drops.  5. Travatan eye drops.  6. Lotemax eye drops.  7. Percocet p.r.n.   PHYSICAL EXAMINATION:  GENERAL:  This is a very pleasant gentleman who is in  no distress on room air.  VITAL SIGNS:  His weight is 194 pounds.  Temperature 97.8.  blood pressure  124/82.  Heart rate 98.  SPO2 92% on room air.  LUNGS:  Slightly distant but clear.  He has decreased breath sounds at his  right base.  ABDOMEN:  Soft, nontender, nondistended, positive bowel sounds.  CARDIAC:  Heart has a regular rate and rhythm without murmur.  NEUROLOGIC:  He has a grossly nonfocal exam.   CT scan of his chest was performed on July 26, 2006.  This showed changes  consistent with his left upper lobe resection and seed implantation.  He  also has a new small right-sided effusion with associated atelectasis.  I do  not see any interstitial changes or infiltrates consistent with BOOP.  Pulmonary function testing was performed today.  This showed mild air flow  limitation without a bronchodilator response.  His lung volumes were  normal  and he had a decreased DLCO that did no fully correct for alveolar volume.   IMPRESSION:  1. History of lung cancer, status post resection and radioactive seed      implantation.  2. Probable mild chronic obstructive pulmonary disease based on today's      spirometry.  3. Exertional hypoxemia.  The patient has refused oxygen with ambulation      in the past.  4. Allergic rhinitis.   PLAN:  1. Start Spiriva once daily and albuterol p.r.n.  2. He will follow up with Drs. Edwyna Shell and [place] regarding his lung      cancer and follow-up CT scans.  I do not believe that BOOP will be an      important part of his clinical picture.  3. I offered ambulatory oximetry and he again declined.  4. He is on fexofenadine and Zyrtec, and he could be changed to only one  of these medications.  5. He will follow up with me in 3 months to assess his improvement on      Spiriva or sooner should he have any difficulties.                                   Leslye Peer, MD   RSB/MedQ  DD:  09/06/2006  DT:  09/07/2006  Job #:  [id number]   cc:   [name]. Cyndie Chime, M.D.  Ines Bloomer, M.D.

## 2011-05-11 NOTE — Op Note (Signed)
NAME:  [name], [name] NO.:  [id number]   MEDICAL RECORD NO.:  [id number]          PATIENT TYPE:  OIB   LOCATION:  1402                         FACILITY:  [facility]   PHYSICIAN:  Heloise Purpura, MD      DATE OF BIRTH:  1941-08-15   DATE OF PROCEDURE:  08/08/2006  DATE OF DISCHARGE:                                 OPERATIVE REPORT   PREOPERATIVE DIAGNOSIS:  1. Right renal calculus.  2. Right ureteropelvic junction obstruction.   POSTOPERATIVE DIAGNOSIS:  1. Right renal calculus.  2. Right ureteropelvic junction obstruction.   PROCEDURES:  1. Right percutaneous nephrolithotomy (first stage).  2. Holmium laser incision of ureteropelvic junction obstruction  3. Right ureteral stent placement.  4. Intraoperative fluoroscopy   SURGEON:  Dr. Heloise Purpura.   ASSISTANT:  Dr. Cornelious Bryant.   ANESTHESIA:  General.   COMPLICATIONS:  None.   ESTIMATED BLOOD LOSS:  200 mL.   DRAINS:  1. A 22-French Council tip nephrostomy tube.  2. A 18-French Foley catheter.   INDICATIONS:  [name] is a 70 year old gentleman who was recently diagnosed  with a 2.2 cm right renal calculus on a hematuria evaluation.  He  subsequently presents for definitive treatment.  Various options for  management were discussed with the patient, and he elected to proceed with  the above procedures.  In addition, he was noted to have right-sided  hydronephrosis with a possible ureteropelvic junction obstruction.  Although  there did not appear to be any definitive obstruction on the renogram, due  to the patient's anatomic findings, consistent with the ureteropelvic  junction obstruction, as well as the fact that he had formed a large renal  calculus on that side, it was decided to visually inspect the ureteropelvic  junction and, if there did appear to be an anatomic narrowing, to perform  holmium laser incision of this area.  Potential risks and benefits were  discussed with the patient and  he consented.   DESCRIPTION OF PROCEDURE:  The patient was taken to the operating room and a  general anesthetic was administered.  He was placed in the prone position  with care to pad all potential pressure points, and prepped and draped in  the usual sterile fashion.  Right renal access had been obtained by Dr.  Lowella Dandy, prior that day, in interventional radiology.  Dr. Lowella Dandy then performed  dilation of the right nephrostomy tract with balloon dilation.  This will be  dictated as a separate note.  A 30-French Amplatz sheath was then placed  over the balloon.  The rigid nephroscope was then inserted into the right  renal pelvis without difficulty.  The right renal collecting system was  carefully examined and the renal calculus was identified in the renal  pelvis.  The lithoclast ultra was then used to perform fragmentation of the  calculus with a combination of ultrasonic and lithotrite lithotripsy  fragmentation.  The stone fragments were then removed with the grasper.  Once all stone was removed from the kidney, attention turned to the  ureteropelvic junction.  A flexible  nephroscope was used to examine the  ureteropelvic junction and it did appear to be narrowed.  The 550 micron  fiber was then used to perform holmium laser incision of the ureteropelvic  junction laterally.  Injection of contrast demonstrated extravasation at the  level of the UPJ, consistent with a full-thickness incision.  An 8 x 26  double-J ureteral stent was then advanced over one of the safety wires,  which had been previously placed during percutaneous access.  This was  appropriately positioned in the bladder and the kidney.  A 22-French Council  tip catheter was then placed into the renal pelvis and placement was  confirmed under fluoroscopy.  The safety wires were then removed and the  Council catheter was secured at the level of the skin with silk sutures.  The patient appeared to tolerate the procedure well  without complications.  He was able to be extubated and transferred to the recovery unit in  satisfactory condition.           ______________________________  Heloise Purpura, MD  Electronically Signed     LB/MEDQ  D:  08/08/2006  T:  08/09/2006  Job:  [id number]

## 2011-05-11 NOTE — H&P (Signed)
NAME:  [name], [name]                  ACCOUNT NO.:  [id number]   MEDICAL RECORD NO.:  [id number]          PATIENT TYPE:  INP   LOCATION:  NA                           FACILITY:  [facility]   PHYSICIAN:  Ines Bloomer, M.D. DATE OF BIRTH:  [birth year]   DATE OF ADMISSION:  DATE OF DISCHARGE:                                HISTORY & PHYSICAL   CHIEF COMPLAINT:  Lung nodules.   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 Laverle Patter.  During his examination, he was found to have  nephrolithiasis but felt showed a right lower lobe lesion.  He underwent a  full chest CT scan which showed actually 2 lesions, a right lower lobe 17 mm  lesion medially an a 16 mm lesion in the right upper lobe with mediastinal  adenopathy, also changes of chronic obstructive pulmonary disease as well as  pulmonary fibrosis.  He quit smoking 25 years ago, but his pulmonary  function test showed an FVC of 4.62 and FEV1 of 3.09.  In 1979, he was  admitted for bronchitis.  He has been treated for allergic rhinitis, too.  He also has a history of questionable infiltrate in the right lower lobe in  1979.  He has had no hemoptysis, fever, chill, or weight loss. He has had no  problems as far as pneumonia or infection recently.   PAST MEDICAL HISTORY:  He has had several corneal transplants.  He is not  allergic to any medications.   He is on:  1.  Prednisone forte 1 drop daily.  2.  Travoprost Z 1 drop daily.  3.  Alphagan  P one drop in both eyes daily.  4.  Cosopt 1 drop twice a day.   FAMILY HISTORY:  Positive for asthma, and his father had emphysema.  His  father also had leukemia, a brother that had peptic ulcer disease.   SOCIAL HISTORY:  He is married, has 2 children, working as an Pensions consultant.  Quit smoking 25 years ago.  Does not drink alcohol on a regular basis.   REVIEW OF SYSTEMS:  He is 195 pounds.  He is 5 feet 7 inches.  CARDIAC: No  atrial  fibrillation. PULMONARY: See History of Present Illness.  GI: No  nausea, vomiting, constipation, or diarrhea.  GU: See History of Present  Illness.  He has nephrolithiasis.  VASCULAR: Venous stasis disease.  No  claudication or TIAs.  NEUROLOGIC: No headaches, blackouts, or seizures.  SKIN: He has had hives for many years.  ORTHOPEDIC: No problems with  arthritis.  PSYCHIATRIC: No psychiatric illnesses.  EYES:  See Past Medical  History.  HEMATOLOGIC: No problems with bleeding or anemia.   PHYSICAL EXAMINATION:  GENERAL:  He is a slightly obese Caucasian in no  acute distress.  VITAL SIGNS: Blood pressure 124/60, pulse 92, respirations 18, O2 saturation  92%.  HEAD:  Atraumatic.  EYES: Pupils equal, round, and reactive to light and accommodation.  Extraocular movements are normal.  EARS:  Tympanic membranes are intact.  NOSE:  There is no septal deviation.  THROAT:  Without lesion.  NECK: Supple with no thyromegaly.  CHEST: No supraclavicular or axillary adenopathy.  Clear to auscultation and  percussion.  HEART: Regular sinus rhythm, no murmurs.  ABDOMEN: Soft. There is no hepatosplenomegaly.  EXTREMITIES:  Pulses are 2+.  There are bilateral venous stasis changes.  NEUROLOGIC:  He is oriented x3.  Sensory and motor intact.   IMPRESSION:  1.  Bilateral lesions with left lesion being positive on PET scan, right      lesion being negative.  2.  History of changes of chronic obstructive pulmonary disease  3.  History of corneal transplants.   PLAN:  Left VATS, resection of left lesion, possible seed implantation, and  continued following of right lower lobe lesion.           ______________________________  Ines Bloomer, M.D.     DPB/MEDQ  D:  05/27/2006  T:  05/27/2006  Job:  [id number]

## 2011-05-11 NOTE — Discharge Summary (Signed)
[name], [name]                  ACCOUNT NO.:  [id number]   MEDICAL RECORD NO.:  [id number]          PATIENT TYPE:  INP   LOCATION:  3312                         FACILITY:  [facility]   PHYSICIAN:  Ines Bloomer, M.D. DATE OF BIRTH:  [birth year]   DATE OF ADMISSION:  05/28/2006  DATE OF DISCHARGE:                                 DISCHARGE SUMMARY   PRIMARY DIAGNOSIS:  Left upper lobe mass, adenocarcinoma involving the  visceropleura with T2N0MX.   IN HOSPITAL DIAGNOSES:  1.  Status post several corneal transplants.  2.  History of nephrolithiasis.   ALLERGIES:  No known drug allergies.   OPERATIONS AND PROCEDURES:  1.  Left video-assisted thoracoscopic surgery with wedge resection of left      upper lobe lesion with node dissection, node seeds implantation.   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 Laverle Patter.  During his examination, he was found  to have nephrolithiasis but follow up showed a right lower lobe lesion.  He  was then scheduled for a full chest CT scan which showed actually two  lesions, in the right lower lobe measuring 17 mm medially and a 16 mm lesion  in the right upper lobe with mediastinal adenopathy.  Also, changes of  chronic obstructive pulmonary disease as well pulmonary fibrosis.  He quit  smoking 25 years ago but his pulmonary function tests showed an FVC of 4.16  and FEV-1 of 3.09.  He also has a history of questionable infiltrate in the  right lower lobe in 1979.  He has had no hemoptysis, fevers, chills or  weight loss.  There is also to be seen a left upper lobe lesion on CT scan.  PET scan was done, the left lesion with positive and the right lesion was  negative.  The patient was seen and evaluated by Dr. Edwyna Shell.  Dr. Edwyna Shell  discussed with the patient undergoing surgery.  He discussed risks and  benefits with the patient.  The patient acknowledged understanding and  agreed to  proceed.   The patient was taken to operating room on May 28, 2006, where he underwent  left video-assisted thoracoscopic surgery with wedge resection of the left  upper lobe lesion with node dissection and node seed implantation.  The  patient tolerated this procedure well and was transferred up to the  intensive care unit in stable condition.  The patient was seen to be  hemodynamically stable following surgery.  During the patient's  postoperative course, follow-up chest x-rays were obtained.  Posterior chest  tube was discontinued on postop day one.  Chest x-rays were obtained and  seen to be stable.  The patient was switched to water seal postop day two  and remaining chest tube was discontinued postop day three.  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  Pulmonary was consulted at that  time.  They suggested aggressive pulmonary toilet with incentive spirometer,  albuterol nebs, and ambulation.  The patient had a difficult time being  weaned off oxygen.  If unable to the saturate greater than 90% prior to  discharge home, possibility of discharging the patient home with oxygen.  The patient's incisions were clean, dry and intact and healing well.  He was  out of bed ambulating well.  He was seen to be afebrile during his  postoperative course.  He remained in normal sinus rhythm.  Dr. Cyndie Chime  saw the patient on postop day three.  He recommended that no adjuvant  chemotherapy was indicated.  He will follow up as needed.  The patient  remained hemodynamically stable postoperatively.  Last hemoglobin and  hematocrit were seen to be 15 and 43.7.  The patient's cytology report came  back positive for adenocarcinoma involving the visceropleura with T2N0MX.   The patient is tentatively ready for discharge home in the next 1-2 days.  Follow-up appointment has been arranged with Dr. Edwyna Shell for June 11, 2006,  at 3:30 p.m..  The patient is to obtain PA and lateral chest x-ray one hour  prior to this appointment.  He is to follow up with Dr. Delton Coombes at his  discretion as well as Dr. Cyndie Chime.  [name] received instructions on  diet, activity level, and incisional care.  He was told no driving until  released to do so, no heavy lifting over 10 pounds.  The patient is told he  is allowed to shower washing his incisions using soap and water.  He is to  contact the office if he develops any drainage or opening from any of his  incision sites.  The patient was told to ambulate 3-4 times per day,  progress as tolerated, and to continue breathing exercises.  He was educated  on diet to be low-fat, low-salt.  He acknowledged understanding.   DISCHARGE MEDICATIONS:  1.  Tylox 1-2 tabs q.4-6h. p.r.n. pain.  2.  Prednisone Forte 1 drop daily.  3.  Alphagan one drop in both eyes daily.  4.  Cosopt one drop twice a day.      Theda Belfast, PA    ______________________________  Ines Bloomer, M.D.    KMD/MEDQ  D:  05/31/2006  T:  06/01/2006  Job:  [id number]   cc:   Leslye Peer, M.D.   [name]. Cyndie Chime, M.D.  Fax: [phone]

## 2011-05-11 NOTE — Op Note (Signed)
NAME:  [name], [name] NO.:  [id number]   MEDICAL RECORD NO.:  [id number]          PATIENT TYPE:  AMB   LOCATION:  [facility]                         FACILITY:  [facility]   PHYSICIAN:  Heloise Purpura, MD      DATE OF BIRTH:  Sep 18, 1941   DATE OF PROCEDURE:  03/27/2006  DATE OF DISCHARGE:  03/18/2006                                 OPERATIVE REPORT   PREOPERATIVE DIAGNOSES:  1.  Right renal calculus.  2.  Gross hematuria.  3.  Possible right ureteropelvic junction obstruction.   POSTOPERATIVE DIAGNOSES:  1.  Right renal calculus.  2.  Gross hematuria.  3.  Possible right ureteropelvic junction obstruction.   PROCEDURES:  1.  Cystoscopy.  2.  Right retrograde pyelography.   SURGEON:  Dr. Heloise Purpura.   ANESTHESIA:  General.   COMPLICATIONS:  None.   INDICATIONS:  [name] is a 70-year-old gentleman who was initially evaluated  for gross hematuria.  He underwent a full hematuria evaluation including  cystoscopy as well as a CT IVP.  This demonstrated a large 2 cm right renal  pelvic calculus along with a dilated renal pelvis consistent with a possible  ureteropelvic junction obstruction.  It was therefore recommended that he  undergo retrograde pyelography for further evaluation of his anatomy and as  far as consideration for treatment of his stone and possible correction of  his ureteropelvic junction obstruction.  After discussing the risks and  benefits of the above procedures, the patient consented.   DESCRIPTION OF PROCEDURE:  The patient was taken to the operating room and a  general anesthetic was administered.  He was given preoperative antibiotics,  placed in the dorsal lithotomy position, prepped and draped in the usual  sterile fashion.  Next preoperative time-out was performed.  Cystourethroscopy was then performed with the 22-French cystoscope.  This  demonstrated a normal anterior urethra except for a small area on the  ventral aspect of the bulb  urethra.  There was noted to be a small flap of  tissue posteriorly.  This was examined with the scope as well as probed and  appeared to be a blind ending pouch.  This is potentially related to his  recent flexible cystoscopy as this area was not as seen on his initial  cystoscopy.  The posterior urethra demonstrated bilateral lateral lobe  enlargement of the prostate.  The bladder was then systematically examined  and demonstrated the ureteral orifices to be normal anatomic position and  effluxing clear urine.  The bladder was systematically examined.  There was  no evidence of any bladder tumors, stones, or other mucosal pathology.  The  right ureteral orifice was then intubated with a 6-French ureteral catheter  and contrast was injected.  This demonstrated a normal caliber ureter  without filling defects.  There was an obvious narrowing at the level of the  ureteropelvic junction consistent with a congenital UPJ  narrowing.  The  right renal collecting system appeared normal without evidence of filling  defects or other abnormalities aside from the previously mentioned 2 cm  stone in the upper portion of the right renal pelvis.  The open-ended  catheter was then removed and the bladder was emptied.  The patient appeared  to tolerate procedure well without complications.  He was able to be  awakened and transferred to recovery unit in satisfactory condition.   RADIOLOGIC FINDINGS:  Retrograde pyelography demonstrated a normal caliber  ureter without dilation or filling defects.  There was noted to be a  narrowing at the level of the ureteropelvic junction.  The right renal  pelvis did appear distended.  However, the patient did not appear to have  any caliceal dilation and aside from the area of the right renal pelvis  calculus, there was no evidence of any other filling defects aside from the  patient's known 2 cm renal pelvis stone.           ______________________________   Heloise Purpura, MD  Electronically Signed     LB/MEDQ  D:  03/27/2006  T:  03/28/2006  Job:  [id number]

## 2011-05-11 NOTE — Discharge Summary (Signed)
NAMEHARTFORD, MAULDEN NO.:  [id number]   MEDICAL RECORD NO.:  [id number]          PATIENT TYPE:  INP   LOCATION:  1402                         FACILITY:  [facility]   PHYSICIAN:  Heloise Purpura, MD      DATE OF BIRTH:  August 24, 1941   DATE OF ADMISSION:  08/08/2006  DATE OF DISCHARGE:  08/11/2006                                 DISCHARGE SUMMARY   ADMISSION DIAGNOSES:  1. Right renal calculus.  2. Right ureteropelvic junction obstruction.   DISCHARGE DIAGNOSES:  1. Right renal calculus.  2. Right ureteropelvic junction obstruction.   PROCEDURES:  1. Right percutaneous nephrolithotomy.  2. Holmium laser endopyelotomy.   HISTORY AND PHYSICAL:  For full details please see admission history and  physical.  Briefly, [name] is a 70 year old gentleman who was recently  evaluated for gross hematuria and was found to have a 2 cm right renal  calculus.  He was also found to have an incidentally detected pulmonary  nodule.  This was further evaluated and subsequently required surgical  excision.  Following recovery, the patient was counseled regarding options  for treatment of his renal calculus.  He elected to proceed with the above  procedures.  In addition, he was noted to have significant hydronephrosis on  the right side with findings consistent with a ureteropelvic junction  obstruction.  It was therefore decided to perform an antegrade endopyelotomy  at the time of his percutaneous nephrolithotomy.   HOSPITAL COURSE:  On August 08, 2006, the patient was taken to the operating  room and underwent the above procedures.  He tolerated these procedures well  and without complication.  Postoperatively, he was able to be transferred to  a regular hospital room following recovery from anesthesia.  His diet was  gradually advanced and he was able to begin ambulating.  The patient's stone  did appear to be completely cleared at the time of the procedure.  On  postoperative  day #1, a nephrostogram was performed which demonstrated no  residual stone fragments.  The patient's ureteral stent did appear to be  somewhat proximal with the distal end just into the bladder.  Therefore,  flexible cystoscopy was performed and the stent was positioned with more of  the stent down into the bladder.  During the initial postoperative period,  the patient was noted to be somewhat hypoxic with oxygen saturation levels  in the high 80s on 4 liters of nasal cannula oxygen.  He was therefore  carefully monitored and a chest x-ray demonstrated some atelectasis.  Later  on postoperative day #1, the patient was also noted to be tachycardiac with  heart rate in the 120s.  Therefore, a CT angiogram was performed to ensure  no evidence of a pulmonary embolus.  This did return negative for pulmonary  embolus.  He was monitored carefully over the course of the next couple of  days and subsequently improved his oxygenation.  Of note, the patient also  does have some hypoxia at baseline and is currently undergoing an outpatient  evaluation by Dr. Delton Coombes.  He  was felt to be stable for discharge after his  nephrostomy tube was removed.  He was subsequently discharged home in  excellent condition.   DISPOSITION:  Home.   DISCHARGE MEDICATIONS:  The patient was instructed to resume his regular  home medication.  In addition, he was given Vicodin to take as needed for  pain, Cipro as an antibiotic, and told to use Colace as a stool softener.   DISCHARGE INSTRUCTIONS:  The patient has been instructed to ambulate as  tolerated but to refrain from any heavy lifting or strenuous activity.   FOLLOW UP:  He will follow up in next 2 weeks for further evaluation.           ______________________________  Heloise Purpura, MD  Electronically Signed     LB/MEDQ  D:  08/12/2006  T:  08/12/2006  Job:  [id number]

## 2011-06-06 ENCOUNTER — Other Ambulatory Visit: Payer: Self-pay | Admitting: Oncology

## 2011-06-06 ENCOUNTER — Encounter (HOSPITAL_BASED_OUTPATIENT_CLINIC_OR_DEPARTMENT_OTHER): Payer: Medicare Other | Admitting: Oncology

## 2011-06-06 ENCOUNTER — Ambulatory Visit (HOSPITAL_COMMUNITY)
Admission: RE | Admit: 2011-06-06 | Discharge: 2011-06-06 | Disposition: A | Discharge status: Home / Self Care | Payer: Medicare Other | Source: Ambulatory Visit | Attending: Oncology | Admitting: Oncology

## 2011-06-06 DIAGNOSIS — J984 Other disorders of lung: Secondary | ICD-10-CM

## 2011-06-06 DIAGNOSIS — Z85118 Personal history of other malignant neoplasm of bronchus and lung: Secondary | ICD-10-CM

## 2011-06-06 DIAGNOSIS — C3430 Malignant neoplasm of lower lobe, unspecified bronchus or lung: Secondary | ICD-10-CM

## 2011-06-06 DIAGNOSIS — C3490 Malignant neoplasm of unspecified part of unspecified bronchus or lung: Secondary | ICD-10-CM | POA: Insufficient documentation

## 2011-06-06 DIAGNOSIS — D649 Anemia, unspecified: Secondary | ICD-10-CM

## 2011-06-06 DIAGNOSIS — K7689 Other specified diseases of liver: Secondary | ICD-10-CM | POA: Insufficient documentation

## 2011-06-06 LAB — CMP (CANCER CENTER ONLY)
AST: 22 U/L (ref 11–38)
Albumin: 3.3 g/dL (ref 3.3–5.5)
Alkaline Phosphatase: 77 U/L (ref 26–84)
Potassium: 4.5 mEq/L (ref 3.3–4.7)
Sodium: 137 mEq/L (ref 128–145)
Total Protein: 7.1 g/dL (ref 6.4–8.1)

## 2011-06-06 LAB — CBC WITH DIFFERENTIAL/PLATELET
BASO%: 0.2 % (ref 0.0–2.0)
EOS%: 5.4 % (ref 0.0–7.0)
MCH: 26.6 pg — ABNORMAL LOW (ref 27.2–33.4)
MCV: 79.8 fL (ref 79.3–98.0)
MONO%: 7.7 % (ref 0.0–14.0)
RBC: 4.6 10*6/uL (ref 4.20–5.82)
RDW: 15.6 % — ABNORMAL HIGH (ref 11.0–14.6)

## 2011-06-06 MED ORDER — IOHEXOL 300 MG/ML  SOLN
80.0000 mL | Freq: Once | INTRAMUSCULAR | Status: AC | PRN
  Administered 2011-06-06: 80 mL via INTRAVENOUS

## 2011-06-12 ENCOUNTER — Ambulatory Visit: Payer: Medicare Other | Admitting: Gastroenterology

## 2011-06-12 ENCOUNTER — Other Ambulatory Visit: Payer: Self-pay | Admitting: Oncology

## 2011-06-12 ENCOUNTER — Encounter (HOSPITAL_BASED_OUTPATIENT_CLINIC_OR_DEPARTMENT_OTHER): Payer: Medicare Other | Admitting: Oncology

## 2011-06-12 DIAGNOSIS — D509 Iron deficiency anemia, unspecified: Secondary | ICD-10-CM

## 2011-06-12 DIAGNOSIS — D649 Anemia, unspecified: Secondary | ICD-10-CM

## 2011-06-12 DIAGNOSIS — K6389 Other specified diseases of intestine: Secondary | ICD-10-CM

## 2011-06-12 DIAGNOSIS — C184 Malignant neoplasm of transverse colon: Secondary | ICD-10-CM

## 2011-06-12 DIAGNOSIS — J984 Other disorders of lung: Secondary | ICD-10-CM

## 2011-06-12 DIAGNOSIS — C3490 Malignant neoplasm of unspecified part of unspecified bronchus or lung: Secondary | ICD-10-CM

## 2011-06-13 ENCOUNTER — Ambulatory Visit (AMBULATORY_SURGERY_CENTER): Payer: Medicare Other | Admitting: *Deleted

## 2011-06-13 VITALS — Ht 67.0 in | Wt 194.7 lb

## 2011-06-13 DIAGNOSIS — R933 Abnormal findings on diagnostic imaging of other parts of digestive tract: Secondary | ICD-10-CM

## 2011-06-13 MED ORDER — MOVIPREP 100 G PO SOLR: ORAL | Status: DC

## 2011-06-14 ENCOUNTER — Encounter: Payer: Self-pay | Admitting: Gastroenterology

## 2011-06-20 ENCOUNTER — Encounter: Payer: Self-pay | Admitting: Gastroenterology

## 2011-06-20 ENCOUNTER — Other Ambulatory Visit (HOSPITAL_COMMUNITY): Payer: Medicare Other

## 2011-06-20 ENCOUNTER — Other Ambulatory Visit: Payer: Self-pay | Admitting: Gastroenterology

## 2011-06-20 ENCOUNTER — Ambulatory Visit (AMBULATORY_SURGERY_CENTER): Payer: Medicare Other | Admitting: Gastroenterology

## 2011-06-20 DIAGNOSIS — R935 Abnormal findings on diagnostic imaging of other abdominal regions, including retroperitoneum: Secondary | ICD-10-CM | POA: Insufficient documentation

## 2011-06-20 DIAGNOSIS — Z860100 Personal history of colon polyps, unspecified: Secondary | ICD-10-CM | POA: Insufficient documentation

## 2011-06-20 DIAGNOSIS — D509 Iron deficiency anemia, unspecified: Secondary | ICD-10-CM

## 2011-06-20 DIAGNOSIS — D371 Neoplasm of uncertain behavior of stomach: Secondary | ICD-10-CM

## 2011-06-20 DIAGNOSIS — D375 Neoplasm of uncertain behavior of rectum: Secondary | ICD-10-CM

## 2011-06-20 DIAGNOSIS — R933 Abnormal findings on diagnostic imaging of other parts of digestive tract: Secondary | ICD-10-CM

## 2011-06-20 DIAGNOSIS — R195 Other fecal abnormalities: Secondary | ICD-10-CM

## 2011-06-20 DIAGNOSIS — D378 Neoplasm of uncertain behavior of other specified digestive organs: Secondary | ICD-10-CM

## 2011-06-20 DIAGNOSIS — D500 Iron deficiency anemia secondary to blood loss (chronic): Secondary | ICD-10-CM | POA: Insufficient documentation

## 2011-06-20 DIAGNOSIS — C189 Malignant neoplasm of colon, unspecified: Secondary | ICD-10-CM | POA: Insufficient documentation

## 2011-06-20 MED ORDER — SODIUM CHLORIDE 0.9 % IV SOLN: 500.0000 mL | INTRAVENOUS | Status: DC

## 2011-06-20 NOTE — Patient Instructions (Addendum)
We will make a referral to DR. [name] for you.  He is a Careers adviser.  Dr. Jarold Motto will call you with the pathology results.  They have been rushed to the lab for you.  You may resume your routine medications.   Keep your appointment for your CT scan later on this week.  If you have any questions or concerns, please call us at [phone].   Best of luck to you.

## 2011-06-21 ENCOUNTER — Telehealth: Payer: Self-pay

## 2011-06-21 NOTE — Telephone Encounter (Signed)

## 2011-06-22 ENCOUNTER — Ambulatory Visit (HOSPITAL_COMMUNITY)
Admission: RE | Admit: 2011-06-22 | Discharge: 2011-06-22 | Disposition: A | Discharge status: Home / Self Care | Payer: Medicare Other | Source: Ambulatory Visit | Attending: Oncology | Admitting: Oncology

## 2011-06-22 ENCOUNTER — Encounter (HOSPITAL_COMMUNITY): Payer: Self-pay

## 2011-06-22 DIAGNOSIS — K6389 Other specified diseases of intestine: Secondary | ICD-10-CM

## 2011-06-22 DIAGNOSIS — Z85118 Personal history of other malignant neoplasm of bronchus and lung: Secondary | ICD-10-CM | POA: Insufficient documentation

## 2011-06-22 DIAGNOSIS — K639 Disease of intestine, unspecified: Secondary | ICD-10-CM | POA: Insufficient documentation

## 2011-06-22 DIAGNOSIS — J984 Other disorders of lung: Secondary | ICD-10-CM | POA: Insufficient documentation

## 2011-06-22 DIAGNOSIS — D1779 Benign lipomatous neoplasm of other sites: Secondary | ICD-10-CM | POA: Insufficient documentation

## 2011-06-22 DIAGNOSIS — C3490 Malignant neoplasm of unspecified part of unspecified bronchus or lung: Secondary | ICD-10-CM

## 2011-06-22 DIAGNOSIS — K5730 Diverticulosis of large intestine without perforation or abscess without bleeding: Secondary | ICD-10-CM | POA: Insufficient documentation

## 2011-06-22 MED ORDER — IOHEXOL 300 MG/ML  SOLN
100.0000 mL | Freq: Once | INTRAMUSCULAR | Status: AC | PRN
  Administered 2011-06-22: 100 mL via INTRAVENOUS

## 2011-06-24 HISTORY — PX: COLON SURGERY: SHX602

## 2011-06-25 ENCOUNTER — Encounter (INDEPENDENT_AMBULATORY_CARE_PROVIDER_SITE_OTHER): Payer: Self-pay | Admitting: Surgery

## 2011-06-25 ENCOUNTER — Ambulatory Visit (INDEPENDENT_AMBULATORY_CARE_PROVIDER_SITE_OTHER): Payer: Medicare Other | Admitting: Surgery

## 2011-06-25 VITALS — BP 146/70 | HR 100 | Temp 97.6°F | Wt 194.0 lb

## 2011-06-25 DIAGNOSIS — C189 Malignant neoplasm of colon, unspecified: Secondary | ICD-10-CM

## 2011-06-25 NOTE — Progress Notes (Signed)
HISTORY: The patient is a 70 year old white male well known to my surgical practice. He had previously been evaluated for hyperparathyroidism. Patient underwent a recent followup CT scan for his left lung carcinoma. An incidental finding was a mass at the splenic flexure of the colon. Patient subsequently underwent a CT scan of the abdomen and pelvis confirming a 3 cm mass at the splenic flexure without obvious evidence of metastatic disease. The patient underwent a colonoscopy last week by Dr. [name]. This showed a near obstructing mass at the splenic flexure. Biopsies showed atypia but no definite malignancy. The gastroenterologist however felt that this was definitely a malignant tumor of the colon. Patient has been seen by his oncologist. He is now referred for consideration for partial colectomy.   Past Medical History  Diagnosis Date  . COPD (chronic obstructive pulmonary disease)   . Nephrolithiasis   . Diverticulosis   . Hx of adenomatous colonic polyps 04/30/07  . Glaucoma   . Hyperlipidemia   . ED (erectile dysfunction)   . Lung nodule   . Hepatic steatosis   . Thoracic spondylosis   . Non-small cell lung cancer    Current Outpatient Prescriptions on File Prior to Visit  Medication Sig Dispense Refill  . aspirin 81 MG tablet Take 81 mg by mouth daily.        . Cholecalciferol (VITAMIN D3) 1000 UNITS CAPS Take 2 capsules by mouth daily.        Marland Kitchen ezetimibe-simvastatin (VYTORIN) 10-40 MG per tablet Take 1 tablet by mouth at bedtime.        Marland Kitchen loratadine (CLARITIN) 10 MG tablet Take 10 mg by mouth 2 (two) times daily.        Marland Kitchen loteprednol (LOTEMAX) 0.5 % ophthalmic suspension Place 1 drop into the left eye daily.        . metFORMIN (GLUMETZA) 1000 MG (MOD) 24 hr tablet Take 1,000 mg by mouth 2 (two) times daily with a meal.        . Omega-3 Fatty Acids (FISH OIL) 1000 MG CAPS Take 6 capsules by mouth 2 (two) times daily.        . timolol (BETIMOL) 0.25 % ophthalmic solution  Place 1-2 drops into the left eye 2 (two) times daily.        . Alum & Mag Hydroxide-Simeth (MAALOX ADVANCED PO) Take by mouth as needed.        . linagliptin (TRADJENTA) 5 MG TABS tablet Take 5 mg by mouth daily.         Current Facility-Administered Medications on File Prior to Visit  Medication Dose Route Frequency Provider Last Rate Last Dose  . 0.9 %  sodium chloride infusion  500 mL Intravenous Continuous [name], MD       No Known Allergies Family History  Problem Relation Age of Onset  . Asthma      aunt  . Asthma Cousin   . Emphysema Father   . Leukemia Father   . Ulcers Brother     of the stomach  . Esophageal cancer Mother   . Lung cancer Father    History  Substance Use Topics  . Smoking status: Former Smoker    Types: Cigarettes    Quit date: 01/12/1979  . Smokeless tobacco: Never Used  . Alcohol Use: 0.6 oz/week    1 Cans of beer per week     occasional beer     PERTINENT REVIEW OF SYSTEMS: Patient notes a decrease in the  caliber of his stools. He also notes a recent dark color to the stools. He has not had any significant recent change in weight.   EXAM: HEENT shows him to be normocephalic atraumatic sclerae clear conjunctivae clear pupils equal and reactive dentition is good mucous membranes are moist Neck is supple nontender without masses. No thyroid nodules. No lymphadenopathy. Chest is clear to auscultation bilaterally without rales rhonchi or wheezes. Well-healed surgical wounds left chest wall. Cardiac exam shows regular rate and rhythm without murmur. Abdomen is soft without distention. No palpable masses. No hepatosplenomegaly. No tenderness. No sign of hernia. Extremities are nontender without edema Neurologically the patient is alert and oriented. There are no focal deficits. There is no sign of tumor.   IMPRESSION: Splenic flexure mass worrisome for adenocarcinoma of the colon. No obvious evidence of metastatic disease.   PLAN: I  had a lengthy discussion with the patient and his wife. We discussed the findings on colonoscopy and on CT scan. We discussed the biopsy results. This is almost certainly an adenocarcinoma of the colon. He will require partial colectomy with primary anastomosis. We discussed possible laparoscopic approach versus open surgery. Given the location of the tumor, I prefer open laparotomy. We discussed the risk of surgery including bleeding and infection. We discussed the hospital stay to be anticipated. We discussed his recovery after leaving the hospital. We discussed the potential need for adjuvant chemotherapy. He understands and wishes to proceed.  We will attempt to get him into the surgical schedule at [facility] in the next one to 2 weeks.  The risks and benefits of the procedure have been discussed at length with the patient.  The patient understands the proposed procedure, potential alternatives to surgery, and the course of recovery to be expected.  All of the patient's questions have been answered at this time.  The patient wishes to proceed with surgery and will schedule a date for their procedure through our office staff.

## 2011-06-28 ENCOUNTER — Other Ambulatory Visit: Payer: Self-pay | Admitting: Oncology

## 2011-06-28 DIAGNOSIS — C189 Malignant neoplasm of colon, unspecified: Secondary | ICD-10-CM

## 2011-07-02 ENCOUNTER — Other Ambulatory Visit (INDEPENDENT_AMBULATORY_CARE_PROVIDER_SITE_OTHER): Payer: Self-pay | Admitting: Surgery

## 2011-07-02 ENCOUNTER — Other Ambulatory Visit (HOSPITAL_COMMUNITY): Payer: Medicare Other

## 2011-07-02 ENCOUNTER — Encounter (HOSPITAL_COMMUNITY): Payer: Medicare Other

## 2011-07-02 LAB — CBC
Hemoglobin: 11.3 g/dL — ABNORMAL LOW (ref 13.0–17.0)
MCV: 80.5 fL (ref 78.0–100.0)
Platelets: 299 10*3/uL (ref 150–400)
RBC: 4.57 MIL/uL (ref 4.22–5.81)
WBC: 10.6 10*3/uL — ABNORMAL HIGH (ref 4.0–10.5)

## 2011-07-02 LAB — COMPREHENSIVE METABOLIC PANEL
ALT: 18 U/L (ref 0–53)
Alkaline Phosphatase: 71 U/L (ref 39–117)
CO2: 27 mEq/L (ref 19–32)
GFR calc Af Amer: >60 mL/min (ref >60)
GFR calc non Af Amer: >60 mL/min (ref >60)
Glucose, Bld: 174 mg/dL — ABNORMAL HIGH (ref 70–99)
Potassium: 4.3 mEq/L (ref 3.5–5.1)
Sodium: 135 mEq/L (ref 135–145)

## 2011-07-02 LAB — DIFFERENTIAL
Eosinophils Relative: 4 % (ref 0–5)
Lymphocytes Relative: 11 % — ABNORMAL LOW (ref 12–46)
Lymphs Abs: 1.2 10*3/uL (ref 0.7–4.0)

## 2011-07-02 LAB — URINALYSIS, ROUTINE W REFLEX MICROSCOPIC
Glucose, UA: NEGATIVE mg/dL
Hgb urine dipstick: NEGATIVE
Specific Gravity, Urine: 1.024 (ref 1.005–1.030)
Urobilinogen, UA: 0.2 mg/dL (ref 0.0–1.0)
pH: 5.5 (ref 5.0–8.0)

## 2011-07-02 LAB — SURGICAL PCR SCREEN: MRSA, PCR: NEGATIVE

## 2011-07-03 ENCOUNTER — Encounter (HOSPITAL_COMMUNITY): Payer: Self-pay

## 2011-07-03 ENCOUNTER — Encounter (HOSPITAL_COMMUNITY)
Admission: RE | Admit: 2011-07-03 | Discharge: 2011-07-03 | Disposition: A | Discharge status: Home / Self Care | Payer: Medicare Other | Source: Ambulatory Visit | Attending: Oncology | Admitting: Oncology

## 2011-07-03 ENCOUNTER — Other Ambulatory Visit: Payer: Self-pay | Admitting: Oncology

## 2011-07-03 DIAGNOSIS — C189 Malignant neoplasm of colon, unspecified: Secondary | ICD-10-CM

## 2011-07-03 MED ORDER — FLUDEOXYGLUCOSE F - 18 (FDG) INJECTION
17.9000 | Freq: Once | INTRAVENOUS | Status: AC | PRN
  Administered 2011-07-03: 17.9 via INTRAVENOUS

## 2011-07-09 ENCOUNTER — Other Ambulatory Visit (INDEPENDENT_AMBULATORY_CARE_PROVIDER_SITE_OTHER): Payer: Self-pay | Admitting: Surgery

## 2011-07-09 ENCOUNTER — Inpatient Hospital Stay (HOSPITAL_COMMUNITY)
Admission: RE | Admit: 2011-07-09 | Discharge: 2011-07-14 | DRG: 330 | Disposition: A | Discharge status: Home / Self Care | Payer: Medicare Other | Source: Ambulatory Visit | Attending: Surgery | Admitting: Surgery

## 2011-07-09 DIAGNOSIS — J4489 Other specified chronic obstructive pulmonary disease: Secondary | ICD-10-CM | POA: Diagnosis present

## 2011-07-09 DIAGNOSIS — Z902 Acquired absence of lung [part of]: Secondary | ICD-10-CM

## 2011-07-09 DIAGNOSIS — C786 Secondary malignant neoplasm of retroperitoneum and peritoneum: Secondary | ICD-10-CM | POA: Diagnosis present

## 2011-07-09 DIAGNOSIS — J449 Chronic obstructive pulmonary disease, unspecified: Secondary | ICD-10-CM | POA: Diagnosis present

## 2011-07-09 DIAGNOSIS — E119 Type 2 diabetes mellitus without complications: Secondary | ICD-10-CM | POA: Diagnosis present

## 2011-07-09 DIAGNOSIS — C189 Malignant neoplasm of colon, unspecified: Secondary | ICD-10-CM

## 2011-07-09 DIAGNOSIS — C185 Malignant neoplasm of splenic flexure: Principal | ICD-10-CM | POA: Diagnosis present

## 2011-07-09 DIAGNOSIS — Z85118 Personal history of other malignant neoplasm of bronchus and lung: Secondary | ICD-10-CM

## 2011-07-09 DIAGNOSIS — K560 Paralytic ileus: Secondary | ICD-10-CM | POA: Diagnosis not present

## 2011-07-09 LAB — TYPE AND SCREEN
ABO/RH(D): O POS
Antibody Screen: NEGATIVE

## 2011-07-09 LAB — GLUCOSE, CAPILLARY
Glucose-Capillary: 184 mg/dL — ABNORMAL HIGH (ref 70–99)
Glucose-Capillary: 122 mg/dL — ABNORMAL HIGH (ref 70–99)

## 2011-07-10 DIAGNOSIS — C189 Malignant neoplasm of colon, unspecified: Secondary | ICD-10-CM

## 2011-07-10 LAB — CBC
HCT: 34.9 % — ABNORMAL LOW (ref 39.0–52.0)
Hemoglobin: 11.1 g/dL — ABNORMAL LOW (ref 13.0–17.0)
MCH: 25.1 pg — ABNORMAL LOW (ref 26.0–34.0)
MCHC: 31.8 g/dL (ref 30.0–36.0)
MCV: 78.8 fL (ref 78.0–100.0)

## 2011-07-10 LAB — GLUCOSE, CAPILLARY: Glucose-Capillary: 183 mg/dL — ABNORMAL HIGH (ref 70–99)

## 2011-07-10 LAB — BASIC METABOLIC PANEL
BUN: 6 mg/dL (ref 6–23)
Calcium: 9 mg/dL (ref 8.4–10.5)
GFR calc non Af Amer: >60 mL/min (ref >60)
Glucose, Bld: 173 mg/dL — ABNORMAL HIGH (ref 70–99)

## 2011-07-10 NOTE — Op Note (Signed)
NAMEARIEN, BENINCASA NO.:  [id number]  MEDICAL RECORD NO.:  [id number]  LOCATION:  X006                         FACILITY:  [facility]  PHYSICIAN:  Velora Heckler, MD      DATE OF BIRTH:  1941/10/12  DATE OF PROCEDURE:  07/09/2011                               OPERATIVE REPORT   PREOPERATIVE DIAGNOSIS:  Adenocarcinoma of the colon.  POSTOPERATIVE DIAGNOSIS:  Adenocarcinoma of the colon.  PROCEDURE:  Left colectomy.  SURGEON:  Velora Heckler, MD, FACS  ANESTHESIA:  General per Dr. Eilene Ghazi.  ESTIMATED BLOOD LOSS:  Less than 100 mL.  PREPARATION:  ChloraPrep.  COMPLICATIONS:  None.  INDICATIONS:  The patient is a 70 year old white male recently diagnosed with adenocarcinoma of the colon.  The patient was being followed for a history of left lung carcinoma.  An incidental finding on CT scan of the chest included a mass at the splenic flexure of the colon.  The patient subsequently underwent colonoscopy by Dr. [name].  He was found to have a near obstructing mass at the splenic flexure.  Biopsies showed significant atypia, suspicious for malignancy.  The patient now comes to surgery for resection.  BODY OF REPORT:  Procedure was done in OR #11 at the [facility].  The patient was brought to the operating room and placed in a supine position on the operating room table.  Following administration of general anesthesia, the patient is prepped and draped in the usual strict aseptic fashion.  After ascertaining that an adequate level of anesthesia had been achieved, a midline abdominal incision is made with a #10 blade.  Dissection was carried through subcutaneous tissues and fascia is incised in the midline and the peritoneal cavity was entered cautiously.  Balfour retractor was placed for exposure.  Abdomen was explored.  Liver was grossly normal.  No sign of metastatic disease.  Gallbladder is distended but does not appear  to contain stones.  Stomach appears grossly normal.  Small bowel appears normal.  Palpation in the pelvis shows no abnormality.  There is a large mass in the splenic flexure of the colon.  This is quite firm and consistent with carcinoma.  Omentum was divided with LigaSure.  Dissection was carried down to the transverse colon.  A point in the mid to distal transverse colon was selected and then transected with a GIA stapler.  Mesentery was divided with the LigaSure.  Left colon was mobilized along its lateral peritoneal attachments by incising the white line with electrocautery.  Dissection both from the direction of the transverse colon and the direction of the descending colon allows for complete mobilization of the splenic flexure.  Care was taken to avoid injury to the spleen, pancreas, or stomach.  Splenic flexure is completely mobilized by dividing tissue with the LigaSure. Large vessels were divided between right-angle hemostats and ligated with 2-0 silk ties.  Dissection along the mesentery posterior to the tumor is difficult due to the tumor involvement with the mesentery, shortening of the mesentery, and the presence of the tumor overlying the ligament of Treitz.  Vessels were divided at the level of the ligament  of Treitz between Hagerman clamps and divided with the LigaSure.  They are then ligated with 2-0 silk ties.  Dissection was then carried around the splenic flexure and into the descending colon.  A point in the mid descending colon is selected and transected with a GIA stapler. Remaining mesentery was divided with the LigaSure and the specimen was removed from the peritoneal cavity.  Specimen was marked with a single suture on the transverse colon and a double suture on the descending colon and the specimen was submitted fresh to pathology.  It was examined by Dr. Jimmy Picket.  It appears all margins were widely negative.  There appears to be a large tumor with  possibly a second satellite tumor present.  Next, the colon is aligned for anastomosis.  An end-to-end anastomosis is performed.  This was performed by placing 2-0 silk stay sutures. Staple lines were excised with electrocautery.  An end-to-end anastomosis is created in a single layer with interrupted 3-0 silk sutures.  Good hemostasis was noted.  No tension is present on the anastomosis.  Both ends of the bowel appear to be well vascularized. Packs were removed and gloves were changed.  Peritoneal cavity is copiously irrigated with several liters of warm normal saline.  Good hemostasis was noted throughout.  The omentum is used to cover the anastomosis and small bowel.  Midline incision was closed with interrupted #1 Novafil simple sutures.  Subcutaneous tissues were irrigated.  Abdominal wall was infiltrated around the incision with Exparel for local field block.  Skin was then closed with stainless steel staples.  Wound is washed and dried and sterile dressings were applied.  The patient is awakened from anesthesia and brought to the recovery room.  The patient tolerated the procedure well.   Velora Heckler, MD, FACS     TMG/MEDQ  D:  07/09/2011  T:  07/09/2011  Job:  [id number]  cc:   Loraine Leriche A. Perini, M.D. Fax: [phone]  Vania Rea. Jarold Motto, MD, FACG, FACP, FAGA [address] [place] [address]  Levert Feinstein, M.D., F.A.C.P. Fax: [phone]  Electronically Signed by [name] MD on 07/10/2011 12:50:58 PM

## 2011-07-11 LAB — GLUCOSE, CAPILLARY
Glucose-Capillary: 172 mg/dL — ABNORMAL HIGH (ref 70–99)
Glucose-Capillary: 158 mg/dL — ABNORMAL HIGH (ref 70–99)
Glucose-Capillary: 153 mg/dL — ABNORMAL HIGH (ref 70–99)

## 2011-07-12 LAB — GLUCOSE, CAPILLARY
Glucose-Capillary: 130 mg/dL — ABNORMAL HIGH (ref 70–99)
Glucose-Capillary: 140 mg/dL — ABNORMAL HIGH (ref 70–99)
Glucose-Capillary: 176 mg/dL — ABNORMAL HIGH (ref 70–99)
Glucose-Capillary: 184 mg/dL — ABNORMAL HIGH (ref 70–99)

## 2011-07-12 LAB — HEMOGLOBIN A1C: Mean Plasma Glucose: 180 mg/dL — ABNORMAL HIGH (ref <117)

## 2011-07-13 LAB — GLUCOSE, CAPILLARY
Glucose-Capillary: 153 mg/dL — ABNORMAL HIGH (ref 70–99)
Glucose-Capillary: 178 mg/dL — ABNORMAL HIGH (ref 70–99)
Glucose-Capillary: 306 mg/dL — ABNORMAL HIGH (ref 70–99)
Glucose-Capillary: 155 mg/dL — ABNORMAL HIGH (ref 70–99)

## 2011-07-14 LAB — BASIC METABOLIC PANEL
Chloride: 103 mEq/L (ref 96–112)
Creatinine, Ser: 0.99 mg/dL (ref 0.50–1.35)
GFR calc Af Amer: >60 mL/min (ref >60)

## 2011-07-14 LAB — CBC
MCV: 78.1 fL (ref 78.0–100.0)
Platelets: 258 10*3/uL (ref 150–400)
RDW: 14.8 % (ref 11.5–15.5)
WBC: 7.2 10*3/uL (ref 4.0–10.5)

## 2011-07-14 LAB — GLUCOSE, CAPILLARY: Glucose-Capillary: 143 mg/dL — ABNORMAL HIGH (ref 70–99)

## 2011-07-15 NOTE — Discharge Summary (Signed)
  [name], Sean NO.:  [id number]  MEDICAL RECORD NO.:  [id number]  LOCATION:  1534                         FACILITY:  [facility]  PHYSICIAN:  Thornton Park. Daphine Deutscher, MD  DATE OF BIRTH:  1941/06/03  DATE OF ADMISSION:  07/09/2011 DATE OF DISCHARGE:  07/14/2011                              DISCHARGE SUMMARY   ADMITTING DIAGNOSIS:  Mass in the splenic flexure.  DISCHARGE DIAGNOSIS:  Poorly-differentiated adenocarcinoma of the colon with negative nodes but a transmural penetration of the pericolonic tissue and a separate 1.2 cm satellite nodule in the mesentery.  (T4, N0).  COURSE IN HOSPITAL:  This 70 year old white male was admitted and underwent left colectomy by Dr. Gerrit Friends on July 09, 2011. Postoperatively he was on Entereg.  His diabetes was managed.  He began with flatus and bowel function by postop day #3, was advanced to a low carbohydrate and full liquid diet which was subsequently advanced on postop day #4 to a solid diet.  CBGs were controlled.  He was seen by Dr. Cyndie Chime in the hospital and arrangements were made for his postoperative chemotherapy.  He was discharged on postop day #5 doing well, given Vicodin for pain.  His staples were removed.  He was tolerating room air and appeared to be back to his baseline with regard to that.  CONDITION:  Improved.     Thornton Park Daphine Deutscher, MD     MBM/MEDQ  D:  07/14/2011  T:  07/14/2011  Job:  [id number]  cc:   Velora Heckler, MD [address] [place] [address]  Levert Feinstein, M.D., F.A.C.P. Fax: [phone]  Loraine Leriche A. Perini, M.D. Fax: [phone]  Ines Bloomer, M.D. [address] [place], [place] [address]  Barry Dienes. Eloise Harman, M.D. Fax: [phone]  Oneita Hurt, M.D. Fax: [phone]  Heloise Purpura, MD Fax: [phone]  Electronically Signed by Luretha Murphy MD on 07/15/2011 10:43:36 PM

## 2011-07-16 NOTE — Consult Note (Signed)
[name], [name] NO.:  [id number]  MEDICAL RECORD NO.:  [id number]  LOCATION:  1534                         FACILITY:  [facility]  PHYSICIAN:  Levert Feinstein, M.D., F.A.C.P.DATE OF BIRTH: 1941/10/02  DATE OF CONSULTATION:  07/12/2011 DATE OF DISCHARGE:                                CONSULTATION   HISTORY:  This is a medical oncology consultation for this 70 year old man with newly diagnosed colon cancer.  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  He has been followed since that time with observation alone. He has a very slowly enlarging right lower lobe nodule.  He was seen in our office for a routine visit on June 12, 2011.  CT scan done in anticipation of that visit to follow his lung nodule incidentally showed a suspicious mass in the transverse colon at the splenic flexure.  This was confirmed on a CT scan of the abdomen and pelvis.  Also of note, his hemoglobin which was 14 just a few months ago had fallen to 12 g at time of the June 12, 2011, visit.  On exam stool was guaiac positive.  A CEA tumor marker was elevated at 33 units.  A PET scan was done on July 03, 2011.  This showed significant uptake in the area of the mass in the splenic flexure SUV 12.7.  No evidence of any other intra-abdominal disease including local lymph nodes or liver. The right lower lobe lung nodule did have low-grade uptake at 3.2.  He was referred to Dr. Gerrit Friends and underwent a left colectomy on July 09, 2011.  Operative findings were a large primary tumor at the splenic flexure.  Possible adjacent satellite nodule.  Liver appeared grossly normal.  No evidence of any intra-abdominal spread of tumor.  Pathology shows a  4.5-cm moderate to poorly differentiated adenocarcinoma.  No gross perforation, transmural penetration with involvement of pericolonic adipose tissue and the visceral peritoneum.  A separate 1.2 cm satellite nodule in the mesentery also invading the visceral peritoneum.  No vascular or lymphatic invasion but positive perineural involvement.  None of 9 lymph nodes involved.  IMPRESSION:  It is difficult to assign a precise stage to this tumor. Primary tumor is T4, N0.  The satellite nodule must be considered an incontinuity metastasis although there is no gross spread of tumor outside the local resection margins.  I would consider this to be higher than a stage II.  I reviewed the pathology results with the patient and his wife.  I do think that he would benefit from adjuvant chemotherapy.  I have set up a meeting to discuss this in more detail next week when he has healed from his surgery.  Thank you for this consultation.     Levert Feinstein, M.D., F.A.C.P.     JMG/MEDQ  D:  07/12/2011  T:  07/12/2011  Job:  [id number]  cc:   Velora Heckler, MD [address]  [place] [address]  Loraine Leriche A. Perini, M.D. Fax: [phone]  Vania Rea. Jarold Motto, MD, FACG, FACP, FAGA [address] [place] [address]  Ines Bloomer, M.D. [address] [place], [place] [address]  Oneita Hurt, M.D. Fax: [phone]  Heloise Purpura, MD Fax: [phone]  Electronically Signed by Cephas Darby M.D. on 07/16/2011 01:52:19 PM

## 2011-07-20 ENCOUNTER — Encounter (INDEPENDENT_AMBULATORY_CARE_PROVIDER_SITE_OTHER): Payer: Self-pay | Admitting: Surgery

## 2011-07-23 ENCOUNTER — Ambulatory Visit (INDEPENDENT_AMBULATORY_CARE_PROVIDER_SITE_OTHER): Payer: Medicare Other | Admitting: Surgery

## 2011-07-23 ENCOUNTER — Encounter (INDEPENDENT_AMBULATORY_CARE_PROVIDER_SITE_OTHER): Payer: Self-pay | Admitting: Surgery

## 2011-07-23 VITALS — BP 124/70 | HR 84 | Temp 97.0°F

## 2011-07-23 DIAGNOSIS — C189 Malignant neoplasm of colon, unspecified: Secondary | ICD-10-CM

## 2011-07-23 NOTE — Progress Notes (Signed)
HISTORY: Patient returns for followup having undergone resection of the splenic flexure of the colon for adenocarcinoma. Primary tumor measured 4.5 cm. There was a satellite tumor measuring 1.2 cm. 9 lymph nodes were negative for metastatic disease. Surgical margins were negative.  Patient has done well since discharge. He is tolerating a regular diet. He is having one to 2 bowel movements daily. He is having minimal pain.   PERTINENT REVIEW OF SYSTEMS: Patient denies nausea or vomiting. He denies significant pain. He is having 1-2 soft bowel movements daily. He has not seen any bleeding.   EXAM: Abdomen is soft nontender without distention. Steri-Strips are removed. Surgical wound is healing nicely without complication. Upper abdomen is soft and nontender without mass.   IMPRESSION: Status post resection of splenic flexure of colon, adenocarcinoma.   PLAN: Patient will see his medical oncologist tomorrow. He is considering adjuvant chemotherapy. Patient will begin applying topical creams to his incision. He is restricted to 25 pounds lifting. He will return to see me in 6 weeks for followup.

## 2011-07-24 ENCOUNTER — Other Ambulatory Visit: Payer: Self-pay | Admitting: Oncology

## 2011-07-24 ENCOUNTER — Encounter (HOSPITAL_BASED_OUTPATIENT_CLINIC_OR_DEPARTMENT_OTHER): Payer: Medicare Other | Admitting: Oncology

## 2011-07-24 DIAGNOSIS — C3430 Malignant neoplasm of lower lobe, unspecified bronchus or lung: Secondary | ICD-10-CM

## 2011-07-24 DIAGNOSIS — D509 Iron deficiency anemia, unspecified: Secondary | ICD-10-CM

## 2011-07-24 DIAGNOSIS — J984 Other disorders of lung: Secondary | ICD-10-CM

## 2011-07-24 DIAGNOSIS — C184 Malignant neoplasm of transverse colon: Secondary | ICD-10-CM

## 2011-07-24 LAB — COMPREHENSIVE METABOLIC PANEL WITH GFR
ALT: 16 U/L (ref 0–53)
AST: 16 U/L (ref 0–37)
Albumin: 3.5 g/dL (ref 3.5–5.2)
Alkaline Phosphatase: 71 U/L (ref 39–117)
BUN: 12 mg/dL (ref 6–23)
CO2: 27 meq/L (ref 19–32)
Calcium: 10.3 mg/dL (ref 8.4–10.5)
Chloride: 101 meq/L (ref 96–112)
Creatinine, Ser: 0.98 mg/dL (ref 0.50–1.35)
Glucose, Bld: 178 mg/dL — ABNORMAL HIGH (ref 70–99)
Potassium: 4.2 meq/L (ref 3.5–5.3)
Sodium: 136 meq/L (ref 135–145)
Total Bilirubin: 0.3 mg/dL (ref 0.3–1.2)
Total Protein: 6.7 g/dL (ref 6.0–8.3)

## 2011-07-24 LAB — CBC WITH DIFFERENTIAL/PLATELET
BASO%: 0.4 % (ref 0.0–2.0)
Basophils Absolute: 0 10*3/uL (ref 0.0–0.1)
EOS%: 9.5 % — ABNORMAL HIGH (ref 0.0–7.0)
HGB: 11.3 g/dL — ABNORMAL LOW (ref 13.0–17.1)
MCH: 25.2 pg — ABNORMAL LOW (ref 27.2–33.4)
MCHC: 32.7 g/dL (ref 32.0–36.0)
RDW: 15.2 % — ABNORMAL HIGH (ref 11.0–14.6)
lymph#: 1.2 10*3/uL (ref 0.9–3.3)

## 2011-08-13 ENCOUNTER — Encounter (HOSPITAL_BASED_OUTPATIENT_CLINIC_OR_DEPARTMENT_OTHER): Payer: Medicare Other | Admitting: Oncology

## 2011-08-13 ENCOUNTER — Other Ambulatory Visit: Payer: Self-pay | Admitting: Oncology

## 2011-08-13 DIAGNOSIS — C184 Malignant neoplasm of transverse colon: Secondary | ICD-10-CM

## 2011-08-13 DIAGNOSIS — D509 Iron deficiency anemia, unspecified: Secondary | ICD-10-CM

## 2011-08-13 DIAGNOSIS — C3430 Malignant neoplasm of lower lobe, unspecified bronchus or lung: Secondary | ICD-10-CM

## 2011-08-13 DIAGNOSIS — J984 Other disorders of lung: Secondary | ICD-10-CM

## 2011-08-13 DIAGNOSIS — D649 Anemia, unspecified: Secondary | ICD-10-CM

## 2011-08-13 DIAGNOSIS — C3490 Malignant neoplasm of unspecified part of unspecified bronchus or lung: Secondary | ICD-10-CM

## 2011-08-13 LAB — COMPREHENSIVE METABOLIC PANEL
Alkaline Phosphatase: 45 U/L (ref 39–117)
Creatinine, Ser: 1.16 mg/dL (ref 0.50–1.35)
Glucose, Bld: 164 mg/dL — ABNORMAL HIGH (ref 70–99)
Sodium: 139 mEq/L (ref 135–145)
Total Bilirubin: 0.5 mg/dL (ref 0.3–1.2)
Total Protein: 6 g/dL (ref 6.0–8.3)

## 2011-08-13 LAB — CBC WITH DIFFERENTIAL/PLATELET
BASO%: 0.4 % (ref 0.0–2.0)
Eosinophils Absolute: 0.7 10*3/uL — ABNORMAL HIGH (ref 0.0–0.5)
LYMPH%: 16.5 % (ref 14.0–49.0)
MCHC: 32.9 g/dL (ref 32.0–36.0)
MCV: 77.6 fL — ABNORMAL LOW (ref 79.3–98.0)
MONO%: 7.5 % (ref 0.0–14.0)
NEUT%: 65.5 % (ref 39.0–75.0)
Platelets: 209 10*3/uL (ref 140–400)
RBC: 4.35 10*6/uL (ref 4.20–5.82)

## 2011-08-13 LAB — CEA: CEA: 4.4 ng/mL (ref 0.0–5.0)

## 2011-09-12 ENCOUNTER — Other Ambulatory Visit: Payer: Self-pay | Admitting: Oncology

## 2011-09-12 ENCOUNTER — Encounter (HOSPITAL_BASED_OUTPATIENT_CLINIC_OR_DEPARTMENT_OTHER): Payer: Medicare Other | Admitting: Oncology

## 2011-09-12 DIAGNOSIS — D509 Iron deficiency anemia, unspecified: Secondary | ICD-10-CM

## 2011-09-12 DIAGNOSIS — C184 Malignant neoplasm of transverse colon: Secondary | ICD-10-CM

## 2011-09-12 DIAGNOSIS — J984 Other disorders of lung: Secondary | ICD-10-CM

## 2011-09-12 DIAGNOSIS — C3430 Malignant neoplasm of lower lobe, unspecified bronchus or lung: Secondary | ICD-10-CM

## 2011-09-12 LAB — CBC WITH DIFFERENTIAL/PLATELET
Basophils Absolute: 0 10*3/uL (ref 0.0–0.1)
EOS%: 5 % (ref 0.0–7.0)
Eosinophils Absolute: 0.3 10*3/uL (ref 0.0–0.5)
HCT: 38.8 % (ref 38.4–49.9)
HGB: 12.9 g/dL — ABNORMAL LOW (ref 13.0–17.1)
MCH: 27.1 pg — ABNORMAL LOW (ref 27.2–33.4)
MONO#: 0.5 10*3/uL (ref 0.1–0.9)
NEUT#: 4.6 10*3/uL (ref 1.5–6.5)
NEUT%: 72.8 % (ref 39.0–75.0)
RDW: 26 % — ABNORMAL HIGH (ref 11.0–14.6)
WBC: 6.3 10*3/uL (ref 4.0–10.3)
lymph#: 0.8 10*3/uL — ABNORMAL LOW (ref 0.9–3.3)

## 2011-09-12 LAB — COMPREHENSIVE METABOLIC PANEL
AST: 18 U/L (ref 0–37)
Albumin: 4.2 g/dL (ref 3.5–5.2)
BUN: 14 mg/dL (ref 6–23)
CO2: 19 mEq/L (ref 19–32)
Calcium: 9.8 mg/dL (ref 8.4–10.5)
Chloride: 106 mEq/L (ref 96–112)
Creatinine, Ser: 1.11 mg/dL (ref 0.50–1.35)
Potassium: 4.3 mEq/L (ref 3.5–5.3)

## 2011-09-12 LAB — LACTATE DEHYDROGENASE: LDH: 178 U/L (ref 94–250)

## 2011-09-19 ENCOUNTER — Ambulatory Visit (INDEPENDENT_AMBULATORY_CARE_PROVIDER_SITE_OTHER): Payer: Medicare Other | Admitting: Surgery

## 2011-09-19 ENCOUNTER — Encounter (INDEPENDENT_AMBULATORY_CARE_PROVIDER_SITE_OTHER): Payer: Self-pay | Admitting: Surgery

## 2011-09-19 VITALS — BP 112/68 | HR 80 | Temp 97.4°F | Resp 20 | Ht 67.0 in | Wt 187.6 lb

## 2011-09-19 DIAGNOSIS — C189 Malignant neoplasm of colon, unspecified: Secondary | ICD-10-CM

## 2011-09-19 NOTE — Progress Notes (Signed)
Visit Diagnoses: 1. Colon cancer     HISTORY: Patient returns for second postoperative visit having undergone segmental colectomy in July 2012 for adenocarcinoma of the colon. He has been evaluated by his medical oncologist. He is taking Xeloda for the next 6 months. He has a followup CT scan of the chest abdomen and pelvis scheduled for November 2012.  Patient has no complaints. He is tolerating a regular diet. He is having one to 2 formed bowel movements daily. He denies abdominal pain. He denies any problems with his incision. He is taking iron so his stools are dark. His hemoglobin levels have returned to normal.   EXAM: Chest is clear to auscultation. Cardiac exam shows regular rate and rhythm. Abdomen is soft without distention. Midline surgical wound is well healed. No sign of herniation with Valsalva. No palpable masses. No tenderness.   IMPRESSION: Status post segmental colectomy for adenocarcinoma of the colon   PLAN: Patient will continue on adjuvant chemotherapy with Xeloda under the direction of his medical oncologist. He will have followup CT scans later this fall. He will require a followup colonoscopy in the late spring or early summer of 2013  Patient will return to see me for followup in 6 months.   Velora Heckler, MD, FACS General & Endocrine Surgery [facility]

## 2011-09-20 LAB — CREATININE, SERUM: GFR calc Af Amer: >60

## 2011-09-20 LAB — BUN: BUN: 10

## 2011-09-24 LAB — GLUCOSE, CAPILLARY: Glucose-Capillary: 138 — ABNORMAL HIGH

## 2011-10-15 ENCOUNTER — Other Ambulatory Visit: Payer: Self-pay | Admitting: Oncology

## 2011-10-15 ENCOUNTER — Ambulatory Visit (HOSPITAL_COMMUNITY)
Admission: RE | Admit: 2011-10-15 | Discharge: 2011-10-15 | Disposition: A | Discharge status: Home / Self Care | Payer: Medicare Other | Source: Ambulatory Visit | Attending: Oncology | Admitting: Oncology

## 2011-10-15 ENCOUNTER — Encounter (HOSPITAL_BASED_OUTPATIENT_CLINIC_OR_DEPARTMENT_OTHER): Payer: Medicare Other | Admitting: Oncology

## 2011-10-15 DIAGNOSIS — K439 Ventral hernia without obstruction or gangrene: Secondary | ICD-10-CM | POA: Insufficient documentation

## 2011-10-15 DIAGNOSIS — C3430 Malignant neoplasm of lower lobe, unspecified bronchus or lung: Secondary | ICD-10-CM

## 2011-10-15 DIAGNOSIS — C189 Malignant neoplasm of colon, unspecified: Secondary | ICD-10-CM | POA: Insufficient documentation

## 2011-10-15 DIAGNOSIS — N400 Enlarged prostate without lower urinary tract symptoms: Secondary | ICD-10-CM | POA: Insufficient documentation

## 2011-10-15 DIAGNOSIS — J984 Other disorders of lung: Secondary | ICD-10-CM

## 2011-10-15 DIAGNOSIS — D509 Iron deficiency anemia, unspecified: Secondary | ICD-10-CM

## 2011-10-15 DIAGNOSIS — C184 Malignant neoplasm of transverse colon: Secondary | ICD-10-CM

## 2011-10-15 DIAGNOSIS — C3490 Malignant neoplasm of unspecified part of unspecified bronchus or lung: Secondary | ICD-10-CM

## 2011-10-15 LAB — CBC WITH DIFFERENTIAL/PLATELET
BASO%: 0.2 % (ref 0.0–2.0)
EOS%: 3.9 % (ref 0.0–7.0)
HCT: 41.5 % (ref 38.4–49.9)
LYMPH%: 18.8 % (ref 14.0–49.0)
MCH: 29.9 pg (ref 27.2–33.4)
MCHC: 33.9 g/dL (ref 32.0–36.0)
MCV: 88.1 fL (ref 79.3–98.0)
MONO%: 8.1 % (ref 0.0–14.0)
NEUT%: 69 % (ref 39.0–75.0)
Platelets: 210 10*3/uL (ref 140–400)
lymph#: 1.5 10*3/uL (ref 0.9–3.3)

## 2011-10-15 LAB — CMP (CANCER CENTER ONLY)
ALT(SGPT): 37 U/L (ref 10–47)
AST: 35 U/L (ref 11–38)
Albumin: 3.6 g/dL (ref 3.3–5.5)
Alkaline Phosphatase: 59 U/L (ref 26–84)
BUN, Bld: 17 mg/dL (ref 7–22)
Calcium: 9.3 mg/dL (ref 8.0–10.3)
Chloride: 101 mEq/L (ref 98–108)
Potassium: 4.3 mEq/L (ref 3.3–4.7)
Sodium: 139 mEq/L (ref 128–145)
Total Protein: 7.1 g/dL (ref 6.4–8.1)

## 2011-10-15 MED ORDER — IOHEXOL 300 MG/ML  SOLN
100.0000 mL | Freq: Once | INTRAMUSCULAR | Status: AC | PRN
  Administered 2011-10-15: 100 mL via INTRAVENOUS

## 2011-10-19 ENCOUNTER — Encounter (HOSPITAL_BASED_OUTPATIENT_CLINIC_OR_DEPARTMENT_OTHER): Payer: Medicare Other | Admitting: Oncology

## 2011-10-19 ENCOUNTER — Other Ambulatory Visit: Payer: Self-pay | Admitting: Oncology

## 2011-10-19 DIAGNOSIS — D509 Iron deficiency anemia, unspecified: Secondary | ICD-10-CM

## 2011-10-19 DIAGNOSIS — C189 Malignant neoplasm of colon, unspecified: Secondary | ICD-10-CM

## 2011-10-30 ENCOUNTER — Ambulatory Visit (HOSPITAL_COMMUNITY)
Admission: RE | Admit: 2011-10-30 | Discharge: 2011-10-30 | Disposition: A | Discharge status: Home / Self Care | Payer: Medicare Other | Source: Ambulatory Visit | Attending: Oncology | Admitting: Oncology

## 2011-10-30 DIAGNOSIS — R911 Solitary pulmonary nodule: Secondary | ICD-10-CM | POA: Insufficient documentation

## 2011-10-30 DIAGNOSIS — C189 Malignant neoplasm of colon, unspecified: Secondary | ICD-10-CM | POA: Insufficient documentation

## 2011-10-30 DIAGNOSIS — Z79899 Other long term (current) drug therapy: Secondary | ICD-10-CM | POA: Insufficient documentation

## 2011-10-30 DIAGNOSIS — D509 Iron deficiency anemia, unspecified: Secondary | ICD-10-CM

## 2011-10-30 DIAGNOSIS — Z980 Intestinal bypass and anastomosis status: Secondary | ICD-10-CM | POA: Insufficient documentation

## 2011-10-30 DIAGNOSIS — R1909 Other intra-abdominal and pelvic swelling, mass and lump: Secondary | ICD-10-CM | POA: Insufficient documentation

## 2011-10-30 DIAGNOSIS — K669 Disorder of peritoneum, unspecified: Secondary | ICD-10-CM | POA: Insufficient documentation

## 2011-10-30 LAB — GLUCOSE, CAPILLARY: Glucose-Capillary: 117 mg/dL — ABNORMAL HIGH (ref 70–99)

## 2011-10-30 MED ORDER — FLUDEOXYGLUCOSE F - 18 (FDG) INJECTION
16.6000 | Freq: Once | INTRAVENOUS | Status: AC | PRN
  Administered 2011-10-30: 16.6 via INTRAVENOUS

## 2011-11-02 ENCOUNTER — Other Ambulatory Visit (HOSPITAL_BASED_OUTPATIENT_CLINIC_OR_DEPARTMENT_OTHER): Payer: Medicare Other | Admitting: Lab

## 2011-11-02 ENCOUNTER — Other Ambulatory Visit: Payer: Self-pay | Admitting: Oncology

## 2011-11-02 ENCOUNTER — Ambulatory Visit (HOSPITAL_BASED_OUTPATIENT_CLINIC_OR_DEPARTMENT_OTHER): Payer: Medicare Other | Admitting: Oncology

## 2011-11-02 VITALS — BP 136/78 | HR 86 | Temp 97.7°F | Ht 67.0 in | Wt 187.7 lb

## 2011-11-02 DIAGNOSIS — C185 Malignant neoplasm of splenic flexure: Secondary | ICD-10-CM

## 2011-11-02 DIAGNOSIS — D509 Iron deficiency anemia, unspecified: Secondary | ICD-10-CM

## 2011-11-02 DIAGNOSIS — C189 Malignant neoplasm of colon, unspecified: Secondary | ICD-10-CM

## 2011-11-02 DIAGNOSIS — E119 Type 2 diabetes mellitus without complications: Secondary | ICD-10-CM | POA: Insufficient documentation

## 2011-11-02 DIAGNOSIS — D649 Anemia, unspecified: Secondary | ICD-10-CM

## 2011-11-02 DIAGNOSIS — C3430 Malignant neoplasm of lower lobe, unspecified bronchus or lung: Secondary | ICD-10-CM | POA: Insufficient documentation

## 2011-11-02 DIAGNOSIS — J984 Other disorders of lung: Secondary | ICD-10-CM

## 2011-11-02 DIAGNOSIS — C3410 Malignant neoplasm of upper lobe, unspecified bronchus or lung: Secondary | ICD-10-CM | POA: Insufficient documentation

## 2011-11-02 DIAGNOSIS — R935 Abnormal findings on diagnostic imaging of other abdominal regions, including retroperitoneum: Secondary | ICD-10-CM

## 2011-11-02 DIAGNOSIS — C184 Malignant neoplasm of transverse colon: Secondary | ICD-10-CM

## 2011-11-02 LAB — HEPATIC FUNCTION PANEL
ALT: 34 U/L (ref 0–53)
AST: 26 U/L (ref 0–37)
Alkaline Phosphatase: 54 U/L (ref 39–117)
Bilirubin, Direct: 0.3 mg/dL (ref 0.0–0.3)
Indirect Bilirubin: 0.7 mg/dL (ref 0.0–0.9)
Total Protein: 6.5 g/dL (ref 6.0–8.3)

## 2011-11-02 NOTE — Progress Notes (Signed)
CC: [name], M.D.  [name], M.D.  Sean Heckler, MD  [name], M.D.  Sean Rea. Jarold Motto, MD, [name], FAGA   FOLLOWUP VISIT  70 year old man initially followed here for bronchioloalveolar carcinoma of the lung.  He underwent a left upper lobe wedge resection with brachytherapy to the tumor bed in June 2007 by Dr. Edwyna Shell and Dr. Kathrynn Running.  He had an additional ill-defined area of abnormality in his right lower lung, which was actually visible on chest x-ray and the first thing that brought him to medical attention.  This area was not PET-avid.  It has been followed over time.  There were some indications from the most recent CT lung when compared to multiple prior studies that this area was slowly enlarging.  Unfortunately, right around that same time, he was incidentally found to have a suspicious mass on cuts through the upper abdomen on a CT scan of the chest done 06/06/2011.  Further evaluation showed a microcytic anemia, guaiac positive stool and elevated CEA.  He was found to have a lesion at the splenic flexure of his colon.  He underwent surgical resection by Dr. [name] 07/09/2011.  Findings were a 4.5 cm moderately to poorly differentiated adenocarcinoma with transmural penetration.  Although 9 lymph nodes were negative, there was a 1.2 cm satellite nodule 3 cm from the main tumor mass and technically this was a stage III lesion, T4 N1c.   He was started on an adjuvant chemotherapy program with single-agent Xeloda at time of visit here in late July.  Postoperative CEA normalized down from preop value of 33 units to 4.4 units by 08/13/2011.     He had a routine restaging evaluation prior to this visit.  Unfortunately, there are some suspicious changes in the abdomen with at least 2 small focal soft tissue areas of abnormality, one in the left upper quadrant of the abdomen between the liver and spleen and one anterior to the distal stomach.  Impossible to tell  whether these are reactive from recent surgery or malignant but they appear more conspicuous than they were on the preop CT scans of 06/06/2011 and a PET scan of 07/03/2011.  Laboratory data now shows a rise in the CEA to 10.5 units as of 10/15/2011.     [name] is asymptomatic.  There are no new findings on physical examination today.  Head and neck:  Normal.  Lungs:  Clear.  Heart:  Regular cardiac rhythm.  No lymphadenopathy.  No mass, tenderness or organomegaly in the abdomen.  Extremities:  No edema.     Additional lab remarkable for a new mild elevation of serum LDH of 290, normal up to 250, which compares with 178 done on 09/12/2011.  Remainder of liver functions are normal.  IMPRESSION:  Unexplained rise in CEA and early soft tissue abnormalities on CT abdomen in a 70 year old man who is just 3 months post resection of a T4 N1c lesion of the transverse colon.  Situation further complicated by a persistent right lower lobe infiltrate in a man with known bronchoalveolar carcinoma.   Disease in his lung is stable and although CEA can be up in lung cancer it is more likely we are seeing some changes related to the recent colon cancer.  This is particularly distressing since they have occurred while he is on adjuvant Xeloda chemotherapy.   I discussed his situation with Dr. Edwyna Shell.  We had originally planned to have Dr. Edwyna Shell do an  interval right lower lobe resection.  I think we have to re-evaluate that decision at present.  I am going to go ahead and get a PET scan to see if in fact we are dealing with more disease than is obvious from the CT scan and to assist in making a clinical decision about what we do next.  I think if the PET scan is positive in the abdomen and his CEA continues to rise, I would certainly defer any lung cancer surgery and initiate a more aggressive chemotherapy regimen.   This is a most unfortunate occurrence in this very nice  man.    ______________________________ Levert Feinstein, M.D., F.A.C.P. JMG/MEDQ  D:  10/20/2011  T:  10/20/2011  Job:  [id number]  A progress note on [name] spelled [id number] date 11/02/2011  Short interval follow up visit for this 70 year old man initially diagnosed with stage I lung cancer status post left upper lobe resection. We have been following a patchy area in the right lower lung likely a small focus of bronchial lower alveolar carcinoma. He was incidentally found to have a mass in the transverse colon on a routine CT scan of the chest which included the upper abdomen. He underwent surgical resection. (07/09/2011). He was found to have a T4 lesion and a small tumor nodule distant from the primary with a final stage III. T4N1c.  He was started on single agent Xeloda current dose 2000 mg twice a day. Please see recent notes for full details.  Recent CT scan showed 2 small areas of abnormality both less than 1.5 cm one adjacent to the stomach likely involving the peritoneum and the other adjacent to the liver. CEA which had normalized after surgery it began to rise to 10 units.  I ordered a PET scan for further evaluation and I reviewed this in person with the patient today.  There is significant uptake and a hypermetabolic lymph node versus peritoneal implant along the greater curvature of the stomach SUV 6.9 with a smaller 6 mm nodule adjacent to the spleen which also has increased metabolic activity. No additional areas of abnormal abnormality in the abdomen or pelvis. The area we have been following in the right lung base has mild activity at 3.0.   Impression  early progression versus minimal residual disease 4 months post resection of the primary tumor in the transverse colon with these changes occurring while he is on adjuvant chemotherapy.    Plan  treatment strategies discussed at length with [name] today. Peritoneal lesions are very small and I don't think it would be  worthwhile to put him through another laparotomy at this time. I will discuss this possibility however with his surgeon. I am inclined to intensify his chemotherapy regimen with the addition of either oxaliplatin or irinotecan Avastin. We discussed the use of both of these drugs with the all of their potential side effects today. He is a diabetic but to date has not had any signs of neuropathy. I'm going to check and see if he would qualify for the maverick trial. This trial randomizes patients to receive either oxaliplatin and or Irinotecan . Both arms of the study received Avastin.  This is a most unfortunate situation for this very nice gentleman.

## 2011-11-12 ENCOUNTER — Telehealth: Payer: Self-pay | Admitting: *Deleted

## 2011-11-16 ENCOUNTER — Other Ambulatory Visit: Payer: Self-pay | Admitting: Oncology

## 2011-11-16 DIAGNOSIS — C189 Malignant neoplasm of colon, unspecified: Secondary | ICD-10-CM

## 2011-11-19 ENCOUNTER — Other Ambulatory Visit: Payer: Self-pay | Admitting: *Deleted

## 2011-11-19 ENCOUNTER — Ambulatory Visit: Payer: Medicare Other

## 2011-11-19 ENCOUNTER — Telehealth: Payer: Self-pay | Admitting: *Deleted

## 2011-11-19 ENCOUNTER — Other Ambulatory Visit: Payer: Medicare Other

## 2011-11-19 ENCOUNTER — Other Ambulatory Visit: Payer: Self-pay | Admitting: Oncology

## 2011-11-19 NOTE — Telephone Encounter (Signed)
Called pt. [phone].  He c/o aches, pain, fever of 103.   His whole family has had this and he is the last one to get it. . It seems to last about 24hrs.   He is on xeloda and is scheduled for his first chemotherapy this am.   He has not been to chemo class.   Let pt. Know he does not need to come to get chemotherapy today.  Will discuss with Dr. Cyndie Chime and r/s lab and chemotherapy and try and get him into a chemo class before his first chemotherapy.

## 2011-11-21 ENCOUNTER — Telehealth: Payer: Self-pay | Admitting: Oncology

## 2011-11-21 ENCOUNTER — Other Ambulatory Visit: Payer: Self-pay | Admitting: *Deleted

## 2011-11-21 ENCOUNTER — Telehealth: Payer: Self-pay | Admitting: *Deleted

## 2011-11-21 NOTE — Telephone Encounter (Signed)
Talked to pt gave him appt for chemo class

## 2011-11-22 ENCOUNTER — Other Ambulatory Visit: Payer: Medicare Other

## 2011-11-22 ENCOUNTER — Encounter: Payer: Self-pay | Admitting: *Deleted

## 2011-11-26 ENCOUNTER — Other Ambulatory Visit (HOSPITAL_BASED_OUTPATIENT_CLINIC_OR_DEPARTMENT_OTHER): Payer: Medicare Other | Admitting: Oncology

## 2011-11-26 ENCOUNTER — Other Ambulatory Visit: Payer: Self-pay

## 2011-11-26 ENCOUNTER — Other Ambulatory Visit: Payer: Self-pay | Admitting: *Deleted

## 2011-11-26 ENCOUNTER — Ambulatory Visit (HOSPITAL_BASED_OUTPATIENT_CLINIC_OR_DEPARTMENT_OTHER): Payer: Medicare Other

## 2011-11-26 VITALS — BP 128/73 | HR 81 | Temp 97.2°F

## 2011-11-26 DIAGNOSIS — C189 Malignant neoplasm of colon, unspecified: Secondary | ICD-10-CM

## 2011-11-26 DIAGNOSIS — C185 Malignant neoplasm of splenic flexure: Secondary | ICD-10-CM

## 2011-11-26 DIAGNOSIS — C3430 Malignant neoplasm of lower lobe, unspecified bronchus or lung: Secondary | ICD-10-CM

## 2011-11-26 DIAGNOSIS — Z5111 Encounter for antineoplastic chemotherapy: Secondary | ICD-10-CM

## 2011-11-26 DIAGNOSIS — Z5112 Encounter for antineoplastic immunotherapy: Secondary | ICD-10-CM

## 2011-11-26 LAB — CBC WITH DIFFERENTIAL/PLATELET
BASO%: 0.2 % (ref 0.0–2.0)
HCT: 36.8 % — ABNORMAL LOW (ref 38.4–49.9)
LYMPH%: 20.8 % (ref 14.0–49.0)
MCHC: 35.3 g/dL (ref 32.0–36.0)
MCV: 89.8 fL (ref 79.3–98.0)
MONO#: 0.5 10*3/uL (ref 0.1–0.9)
MONO%: 5.8 % (ref 0.0–14.0)
NEUT%: 68.8 % (ref 39.0–75.0)
Platelets: 170 10*3/uL (ref 140–400)
RBC: 4.1 10*6/uL — ABNORMAL LOW (ref 4.20–5.82)
WBC: 8.1 10*3/uL (ref 4.0–10.3)

## 2011-11-26 MED ORDER — OXALIPLATIN CHEMO INJECTION 100 MG/20ML
85.0000 mg/m2 | Freq: Once | INTRAVENOUS | Status: AC
  Administered 2011-11-26: 170 mg via INTRAVENOUS
  Filled 2011-11-26: qty 34

## 2011-11-26 MED ORDER — ONDANSETRON 8 MG/50ML IVPB (CHCC)
8.0000 mg | Freq: Once | INTRAVENOUS | Status: AC
  Administered 2011-11-26: 8 mg via INTRAVENOUS

## 2011-11-26 MED ORDER — CAPECITABINE 500 MG PO TABS: 2000.0000 mg | ORAL_TABLET | Freq: Two times a day (BID) | ORAL | Status: DC

## 2011-11-26 MED ORDER — SODIUM CHLORIDE 0.9 % IV SOLN
5.0000 mg/kg | Freq: Once | INTRAVENOUS | Status: AC
  Administered 2011-11-26: 425 mg via INTRAVENOUS
  Filled 2011-11-26: qty 17

## 2011-11-26 MED ORDER — DEXAMETHASONE SODIUM PHOSPHATE 10 MG/ML IJ SOLN
10.0000 mg | Freq: Once | INTRAMUSCULAR | Status: AC
  Administered 2011-11-26: 10 mg via INTRAVENOUS

## 2011-11-26 MED ORDER — DEXTROSE 5 % IV SOLN
Freq: Once | INTRAVENOUS | Status: AC
  Administered 2011-11-26: 15:00:00 via INTRAVENOUS

## 2011-11-26 NOTE — Patient Instructions (Signed)
Pt education regarding s/s oxaliplatin and avastin and when to notify nurse during infusion and when to call after discharge.PT voices understanding

## 2011-11-26 NOTE — Telephone Encounter (Signed)
RECEIVED A FAX FROM [employer] CONCERNING A PRESCRIPTION REFILL REQUEST FOR XELODA. THIS REQUEST WAS PLACED IN DR.GRANFORTUNA'S ACTIVE WORK BOX.

## 2011-11-29 ENCOUNTER — Encounter: Payer: Self-pay | Admitting: *Deleted

## 2011-11-29 NOTE — Progress Notes (Signed)
Received fax confirmation from [employer] that xeloda was shipped on 11/28/11 for scheduled delivery next business day.

## 2011-12-03 ENCOUNTER — Other Ambulatory Visit: Payer: Self-pay | Admitting: Certified Registered Nurse Anesthetist

## 2011-12-05 ENCOUNTER — Ambulatory Visit (HOSPITAL_BASED_OUTPATIENT_CLINIC_OR_DEPARTMENT_OTHER): Payer: Medicare Other | Admitting: Oncology

## 2011-12-05 ENCOUNTER — Other Ambulatory Visit (HOSPITAL_BASED_OUTPATIENT_CLINIC_OR_DEPARTMENT_OTHER): Payer: Medicare Other | Admitting: Lab

## 2011-12-05 VITALS — BP 123/75 | HR 88 | Temp 96.7°F | Ht 67.0 in | Wt 184.4 lb

## 2011-12-05 DIAGNOSIS — E119 Type 2 diabetes mellitus without complications: Secondary | ICD-10-CM

## 2011-12-05 DIAGNOSIS — R970 Elevated carcinoembryonic antigen [CEA]: Secondary | ICD-10-CM

## 2011-12-05 DIAGNOSIS — C185 Malignant neoplasm of splenic flexure: Secondary | ICD-10-CM

## 2011-12-05 DIAGNOSIS — C3430 Malignant neoplasm of lower lobe, unspecified bronchus or lung: Secondary | ICD-10-CM

## 2011-12-05 DIAGNOSIS — C189 Malignant neoplasm of colon, unspecified: Secondary | ICD-10-CM

## 2011-12-05 DIAGNOSIS — Z85118 Personal history of other malignant neoplasm of bronchus and lung: Secondary | ICD-10-CM

## 2011-12-05 DIAGNOSIS — C3410 Malignant neoplasm of upper lobe, unspecified bronchus or lung: Secondary | ICD-10-CM

## 2011-12-05 LAB — COMPREHENSIVE METABOLIC PANEL
Albumin: 4 g/dL (ref 3.5–5.2)
Alkaline Phosphatase: 58 U/L (ref 39–117)
BUN: 11 mg/dL (ref 6–23)
Calcium: 9.8 mg/dL (ref 8.4–10.5)
Chloride: 105 mEq/L (ref 96–112)
Creatinine, Ser: 1.01 mg/dL (ref 0.50–1.35)
Glucose, Bld: 172 mg/dL — ABNORMAL HIGH (ref 70–99)
Potassium: 4.3 mEq/L (ref 3.5–5.3)

## 2011-12-05 LAB — CBC WITH DIFFERENTIAL/PLATELET
Basophils Absolute: 0 10*3/uL (ref 0.0–0.1)
EOS%: 4.3 % (ref 0.0–7.0)
Eosinophils Absolute: 0.4 10*3/uL (ref 0.0–0.5)
HCT: 41.2 % (ref 38.4–49.9)
HGB: 14.1 g/dL (ref 13.0–17.1)
MCH: 33.1 pg (ref 27.2–33.4)
MONO#: 0.8 10*3/uL (ref 0.1–0.9)
NEUT#: 6.1 10*3/uL (ref 1.5–6.5)
NEUT%: 72.4 % (ref 39.0–75.0)
RDW: 23.8 % — ABNORMAL HIGH (ref 11.0–14.6)
WBC: 8.4 10*3/uL (ref 4.0–10.3)
lymph#: 1.2 10*3/uL (ref 0.9–3.3)

## 2011-12-05 LAB — CEA: CEA: 10.5 ng/mL — ABNORMAL HIGH (ref 0.0–5.0)

## 2011-12-05 NOTE — Progress Notes (Signed)
Hematology and Oncology Follow Up Visit  [name] [id number] 02-Mar-1941 70 y.o. 12/05/2011 1:09 PM   Principle Diagnosis: 1. Metastatic Colon cancer  Interim History:   Due to a rise in CEA tumor marker and CT scan and PET scan findings of 2 areas of peritoneal disease, I changed his chemotherapy program from single agent Xeloda to a combination of Xeloda, . Avastin, and oxaliplatin. We are still out of sync with the Xeloda. I am going to change the schedule to a 1 week on 1 week off to coincide with day 1 and day 14 of oxaliplatin and Avastin. He would like to move his treatments to Wednesdays so her next scheduled treatment will be on 12/19. He is getting a sore throat from the combined chemotherapy drugs but no oral ulcers. He has lost about 4 pounds but appetite is still good. Low grade diarrhea not requiring medications. No skin changes. No paresthesias. He denies any dyspnea or cough. No pain. He did experience local pain running up his forearm at the site of the oxaliplatin infusion mild after the first dose but more significant after the second dose and his symptoms lasted for a few days. He is not getting any cold intolerance on the oxaliplatin.  Medications: I have reviewed the patient's current medications.  Allergies: No Known Allergies  Past Medical History, Surgical history, Social history, and Family History were reviewed and updated.  Review of Systems: Constitutional:  Negative for fever, chills, night sweats, anorexia, weight loss, pain. Cardiovascular: no chest pain or dyspnea on exertion Respiratory: no cough, shortness of breath, or wheezing Neurological: no TIA or stroke symptoms Dermatological: negative ENT: negative Skin Gastrointestinal: no abdominal pain, change in bowel habits, or black or bloody stools Genito-Urinary: no dysuria, trouble voiding, or hematuria Hematological and Lymphatic: negative Breast: not examined Musculoskeletal: negative Remaining  ROS negative.  Physical Exam: Blood pressure 123/75, pulse 88, temperature 96.7 F (35.9 C), temperature source Oral, height 5\' 7"  (1.702 m), weight 184 lb 6.4 oz (83.643 kg). ECOG:  General appearance: alert, cooperative and appears stated age Head: atraumatic Neck: no adenopathy, no carotid bruit, no JVD, supple, symmetrical, trachea midline and thyroid not enlarged, symmetric, no tenderness/mass/nodules Lymph nodes: Cervical, supraclavicular, and axillary nodes normal. Lung exam: Clear to auscultation resonant to percussion. Cardiac exam: Regular cardiac rhythm no murmur gallop or rub. Abdominal exam: Soft nontender no mass no organomegaly. Neurologic exam: Chronic visual changes from the corneal transplants. Chronic injection of the conjunctivae. Motor strength is 5 over 5. Reflexes absent symmetric at the knees 1+ symmetric at the biceps. Extremities: No edema or calf tenderness.  Lab Results: Lab Results  Component Value Date   WBC 8.4 12/05/2011   HGB 14.1 12/05/2011   HCT 41.2 12/05/2011   MCV 96.8 12/05/2011   PLT 231 12/05/2011     Chemistry      Component Value Date/Time   NA 139 10/15/2011 1601   NA 140 09/12/2011 1034   NA 140 09/12/2011 1034   K 4.3 10/15/2011 1601   K 4.3 09/12/2011 1034   K 4.3 09/12/2011 1034   CL 101 10/15/2011 1601   CL 106 09/12/2011 1034   CL 106 09/12/2011 1034   CO2 30 10/15/2011 1601   CO2 19 09/12/2011 1034   CO2 19 09/12/2011 1034   BUN 17 10/15/2011 1601   BUN 14 09/12/2011 1034   BUN 14 09/12/2011 1034   CREATININE 1.1 10/15/2011 1601   CREATININE 1.11 09/12/2011 1034  CREATININE 1.11 09/12/2011 1034      Component Value Date/Time   CALCIUM 9.3 10/15/2011 1601   CALCIUM 9.8 09/12/2011 1034   CALCIUM 9.8 09/12/2011 1034   ALKPHOS 54 11/02/2011 1136   ALKPHOS 54 11/02/2011 1136   ALKPHOS 59 10/15/2011 1601   AST 26 11/02/2011 1136   AST 26 11/02/2011 1136   AST 35 10/15/2011 1601   ALT 34 11/02/2011 1136   ALT 34 11/02/2011 1136    BILITOT 1.0 11/02/2011 1136   BILITOT 1.0 11/02/2011 1136   BILITOT 1.30 10/15/2011 1601       Radiological Studies: chest X-ray   Impression and Plan: 1.  colon cancer initial stage III with a single area of disease in the soft tissues outside the primary with otherwise negative lymph nodes. Rise in CEA and development of 2 small areas of intra-abdominal involvement on CT and PET scan while on oral Xeloda chemotherapy for about 2 months. More aggressive chemotherapy regimen now and progress as outlined above.  2. status post resected adenocarcinoma left upper lobe with postoperative chemotherapy.  #3. 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   #4. Type 2 diabetes  #5. History of kidney stones.  #6. Status post bilateral corneal transplants.   Levert Feinstein, MD 12/12/20121:09 PM

## 2011-12-06 ENCOUNTER — Other Ambulatory Visit: Payer: Self-pay | Admitting: Oncology

## 2011-12-10 ENCOUNTER — Ambulatory Visit: Payer: Medicare Other

## 2011-12-11 ENCOUNTER — Ambulatory Visit: Payer: Medicare Other

## 2011-12-12 ENCOUNTER — Ambulatory Visit (HOSPITAL_BASED_OUTPATIENT_CLINIC_OR_DEPARTMENT_OTHER): Payer: Medicare Other

## 2011-12-12 ENCOUNTER — Other Ambulatory Visit (HOSPITAL_BASED_OUTPATIENT_CLINIC_OR_DEPARTMENT_OTHER): Payer: Medicare Other | Admitting: Lab

## 2011-12-12 VITALS — BP 130/79 | HR 79 | Temp 97.5°F

## 2011-12-12 DIAGNOSIS — C772 Secondary and unspecified malignant neoplasm of intra-abdominal lymph nodes: Secondary | ICD-10-CM

## 2011-12-12 DIAGNOSIS — C189 Malignant neoplasm of colon, unspecified: Secondary | ICD-10-CM

## 2011-12-12 DIAGNOSIS — Z5112 Encounter for antineoplastic immunotherapy: Secondary | ICD-10-CM

## 2011-12-12 DIAGNOSIS — Z5111 Encounter for antineoplastic chemotherapy: Secondary | ICD-10-CM

## 2011-12-12 DIAGNOSIS — C185 Malignant neoplasm of splenic flexure: Secondary | ICD-10-CM

## 2011-12-12 LAB — COMPREHENSIVE METABOLIC PANEL
ALT: 48 U/L (ref 0–53)
CO2: 20 mEq/L (ref 19–32)
Calcium: 9.8 mg/dL (ref 8.4–10.5)
Chloride: 105 mEq/L (ref 96–112)
Creatinine, Ser: 0.96 mg/dL (ref 0.50–1.35)
Glucose, Bld: 121 mg/dL — ABNORMAL HIGH (ref 70–99)
Sodium: 140 mEq/L (ref 135–145)
Total Protein: 6.2 g/dL (ref 6.0–8.3)

## 2011-12-12 LAB — CBC WITH DIFFERENTIAL/PLATELET
BASO%: 0.4 % (ref 0.0–2.0)
Eosinophils Absolute: 0.4 10*3/uL (ref 0.0–0.5)
HCT: 40.2 % (ref 38.4–49.9)
MCHC: 35.1 g/dL (ref 32.0–36.0)
MONO#: 0.6 10*3/uL (ref 0.1–0.9)
NEUT#: 4.8 10*3/uL (ref 1.5–6.5)
NEUT%: 63.4 % (ref 39.0–75.0)
Platelets: 166 10*3/uL (ref 140–400)
WBC: 7.5 10*3/uL (ref 4.0–10.3)
lymph#: 1.7 10*3/uL (ref 0.9–3.3)

## 2011-12-12 LAB — LACTATE DEHYDROGENASE: LDH: 253 U/L — ABNORMAL HIGH (ref 94–250)

## 2011-12-12 MED ORDER — DEXTROSE 5 % IV SOLN: Freq: Once | INTRAVENOUS | Status: DC

## 2011-12-12 MED ORDER — DEXAMETHASONE SODIUM PHOSPHATE 10 MG/ML IJ SOLN
10.0000 mg | Freq: Once | INTRAMUSCULAR | Status: AC
  Administered 2011-12-12: 10 mg via INTRAVENOUS

## 2011-12-12 MED ORDER — OXALIPLATIN CHEMO INJECTION 100 MG/20ML
85.0000 mg/m2 | Freq: Once | INTRAVENOUS | Status: AC
  Administered 2011-12-12: 170 mg via INTRAVENOUS
  Filled 2011-12-12: qty 34

## 2011-12-12 MED ORDER — SODIUM CHLORIDE 0.9 % IV SOLN
5.0000 mg/kg | Freq: Once | INTRAVENOUS | Status: AC
  Administered 2011-12-12: 425 mg via INTRAVENOUS
  Filled 2011-12-12: qty 17

## 2011-12-12 MED ORDER — ONDANSETRON 8 MG/50ML IVPB (CHCC)
8.0000 mg | Freq: Once | INTRAVENOUS | Status: AC
  Administered 2011-12-12: 8 mg via INTRAVENOUS

## 2011-12-12 MED ORDER — SODIUM CHLORIDE 0.9 % IV SOLN: Freq: Once | INTRAVENOUS | Status: DC

## 2011-12-13 ENCOUNTER — Other Ambulatory Visit: Payer: Self-pay | Admitting: Oncology

## 2011-12-13 DIAGNOSIS — C189 Malignant neoplasm of colon, unspecified: Secondary | ICD-10-CM

## 2011-12-17 ENCOUNTER — Other Ambulatory Visit: Payer: Self-pay | Admitting: Certified Registered Nurse Anesthetist

## 2011-12-20 ENCOUNTER — Telehealth: Payer: Self-pay | Admitting: Oncology

## 2011-12-20 ENCOUNTER — Telehealth: Payer: Self-pay | Admitting: *Deleted

## 2011-12-20 ENCOUNTER — Ambulatory Visit: Payer: Medicare Other | Admitting: Nurse Practitioner

## 2011-12-20 ENCOUNTER — Encounter: Payer: Self-pay | Admitting: *Deleted

## 2011-12-20 NOTE — Telephone Encounter (Signed)
Received call from pt. Stating that he does not have a f/u appt with MD & has called twice but no one has called him back.  He also reports hoarseness without other symptoms & wants to know if this could be from his chemo.  He is on xeloda, avastin, & oxaliplatin. Message given to scheduler/Rose to schedule appt with Lonna Cobb NP for 01/08/11 with chemo per order & to notify pt.  Will discuss hoarseness with Lonna Cobb NP.

## 2011-12-20 NOTE — Progress Notes (Signed)
Received confirmation of prescription shipment on 12/19/11 for delivery next business day for xeloda from [facility].

## 2011-12-20 NOTE — Telephone Encounter (Signed)
Called pt,left message on cell phone regarding appt for 12/26/11 and ML visit on 01/09/12

## 2011-12-21 ENCOUNTER — Telehealth: Payer: Self-pay | Admitting: *Deleted

## 2011-12-24 ENCOUNTER — Other Ambulatory Visit: Payer: Self-pay | Admitting: Certified Registered Nurse Anesthetist

## 2011-12-24 ENCOUNTER — Ambulatory Visit: Payer: Medicare Other

## 2011-12-25 ENCOUNTER — Other Ambulatory Visit: Payer: Self-pay | Admitting: Oncology

## 2011-12-25 DIAGNOSIS — C189 Malignant neoplasm of colon, unspecified: Secondary | ICD-10-CM

## 2011-12-26 ENCOUNTER — Other Ambulatory Visit: Payer: Medicare Other | Admitting: Lab

## 2011-12-26 ENCOUNTER — Ambulatory Visit (HOSPITAL_BASED_OUTPATIENT_CLINIC_OR_DEPARTMENT_OTHER): Payer: Medicare Other

## 2011-12-26 ENCOUNTER — Other Ambulatory Visit: Payer: Self-pay

## 2011-12-26 DIAGNOSIS — C189 Malignant neoplasm of colon, unspecified: Secondary | ICD-10-CM

## 2011-12-26 DIAGNOSIS — Z5111 Encounter for antineoplastic chemotherapy: Secondary | ICD-10-CM

## 2011-12-26 DIAGNOSIS — C801 Malignant (primary) neoplasm, unspecified: Secondary | ICD-10-CM

## 2011-12-26 LAB — CBC WITH DIFFERENTIAL/PLATELET
BASO%: 0.4 % (ref 0.0–2.0)
EOS%: 5.3 % (ref 0.0–7.0)
HGB: 14.4 g/dL (ref 13.0–17.1)
MCH: 32.7 pg (ref 27.2–33.4)
MCV: 92.5 fL (ref 79.3–98.0)
MONO%: 11.4 % (ref 0.0–14.0)
RBC: 4.41 10*6/uL (ref 4.20–5.82)
RDW: 18.1 % — ABNORMAL HIGH (ref 11.0–14.6)
lymph#: 1.2 10*3/uL (ref 0.9–3.3)
nRBC: 0 % (ref 0–0)

## 2011-12-26 LAB — COMPREHENSIVE METABOLIC PANEL
ALT: 34 U/L (ref 0–53)
AST: 34 U/L (ref 0–37)
Albumin: 4.4 g/dL (ref 3.5–5.2)
Alkaline Phosphatase: 54 U/L (ref 39–117)
Potassium: 4.6 mEq/L (ref 3.5–5.3)
Sodium: 140 mEq/L (ref 135–145)
Total Bilirubin: 1.2 mg/dL (ref 0.3–1.2)
Total Protein: 6 g/dL (ref 6.0–8.3)

## 2011-12-26 LAB — CEA: CEA: 9.8 ng/mL — ABNORMAL HIGH (ref 0.0–5.0)

## 2011-12-26 MED ORDER — SODIUM CHLORIDE 0.9 % IV SOLN
5.0000 mg/kg | Freq: Once | INTRAVENOUS | Status: AC
  Administered 2011-12-26: 425 mg via INTRAVENOUS
  Filled 2011-12-26: qty 17

## 2011-12-26 MED ORDER — SODIUM CHLORIDE 0.9 % IV SOLN
Freq: Once | INTRAVENOUS | Status: AC
  Administered 2011-12-26: 14:00:00 via INTRAVENOUS

## 2011-12-26 MED ORDER — ONDANSETRON 8 MG/50ML IVPB (CHCC)
8.0000 mg | Freq: Once | INTRAVENOUS | Status: AC
  Administered 2011-12-26: 8 mg via INTRAVENOUS

## 2011-12-26 MED ORDER — DEXTROSE 5 % IV SOLN
Freq: Once | INTRAVENOUS | Status: AC
  Administered 2011-12-26: 50 mL via INTRAVENOUS

## 2011-12-26 MED ORDER — OXALIPLATIN CHEMO INJECTION 100 MG/20ML
85.0000 mg/m2 | Freq: Once | INTRAVENOUS | Status: AC
  Administered 2011-12-26: 170 mg via INTRAVENOUS
  Filled 2011-12-26: qty 34

## 2011-12-26 MED ORDER — DEXAMETHASONE SODIUM PHOSPHATE 10 MG/ML IJ SOLN
10.0000 mg | Freq: Once | INTRAMUSCULAR | Status: AC
  Administered 2011-12-26: 10 mg via INTRAVENOUS

## 2011-12-28 ENCOUNTER — Telehealth: Payer: Self-pay | Admitting: *Deleted

## 2011-12-28 DIAGNOSIS — C189 Malignant neoplasm of colon, unspecified: Secondary | ICD-10-CM

## 2011-12-28 MED ORDER — AMOXICILLIN-POT CLAVULANATE 500-125 MG PO TABS: 1.0000 | ORAL_TABLET | Freq: Two times a day (BID) | ORAL | Status: AC

## 2011-12-28 NOTE — Telephone Encounter (Addendum)
Pt. Called stating that he is going to have some dental work/remove crown/fix cavity & wants to know if he is cleared for this.  Discussed with Dr. Cyndie Chime & pt will need to have a cbc & may need ATB before any dental work.  Pt. was notified of this. ATB sent to pt's pharmacy & pt. Notified to start.  We will see him 01/04/12 & see what counts are.  In the meantime he can contact his dentist but no dental work until we know what counts are.

## 2012-01-04 ENCOUNTER — Ambulatory Visit (HOSPITAL_BASED_OUTPATIENT_CLINIC_OR_DEPARTMENT_OTHER): Payer: Medicare Other | Admitting: Oncology

## 2012-01-04 ENCOUNTER — Telehealth: Payer: Self-pay | Admitting: Oncology

## 2012-01-04 ENCOUNTER — Other Ambulatory Visit (HOSPITAL_BASED_OUTPATIENT_CLINIC_OR_DEPARTMENT_OTHER): Payer: Medicare Other | Admitting: Lab

## 2012-01-04 DIAGNOSIS — C189 Malignant neoplasm of colon, unspecified: Secondary | ICD-10-CM

## 2012-01-04 DIAGNOSIS — J984 Other disorders of lung: Secondary | ICD-10-CM

## 2012-01-04 DIAGNOSIS — Z79899 Other long term (current) drug therapy: Secondary | ICD-10-CM

## 2012-01-04 DIAGNOSIS — C3430 Malignant neoplasm of lower lobe, unspecified bronchus or lung: Secondary | ICD-10-CM

## 2012-01-04 DIAGNOSIS — C779 Secondary and unspecified malignant neoplasm of lymph node, unspecified: Secondary | ICD-10-CM

## 2012-01-04 LAB — COMPREHENSIVE METABOLIC PANEL
ALT: 29 U/L (ref 0–53)
CO2: 25 mEq/L (ref 19–32)
Calcium: 9.7 mg/dL (ref 8.4–10.5)
Chloride: 106 mEq/L (ref 96–112)
Creatinine, Ser: 1.01 mg/dL (ref 0.50–1.35)
Glucose, Bld: 201 mg/dL — ABNORMAL HIGH (ref 70–99)
Total Bilirubin: 0.7 mg/dL (ref 0.3–1.2)
Total Protein: 6.1 g/dL (ref 6.0–8.3)

## 2012-01-04 LAB — CBC WITH DIFFERENTIAL/PLATELET
BASO%: 0.1 % (ref 0.0–2.0)
EOS%: 5.7 % (ref 0.0–7.0)
MCH: 33.4 pg (ref 27.2–33.4)
MCV: 96.6 fL (ref 79.3–98.0)
MONO%: 10.1 % (ref 0.0–14.0)
RBC: 4.21 10*6/uL (ref 4.20–5.82)
RDW: 19.5 % — ABNORMAL HIGH (ref 11.0–14.6)
lymph#: 1.3 10*3/uL (ref 0.9–3.3)

## 2012-01-04 LAB — CEA: CEA: 8.6 ng/mL — ABNORMAL HIGH (ref 0.0–5.0)

## 2012-01-04 NOTE — Progress Notes (Signed)
Hematology and Oncology Follow Up Visit  [name] [id number] [birth year] 71 y.o. 01/04/2012 5:48 PM   Principle Diagnosis: Encounter Diagnoses  Name Primary?  . Colon adenocarcinoma   . Lung cancer, lower lobe      Interim History:   [name] tolerated the first 2 doses of oxaliplatin with no side effects but the third dose car up with him. He had nausea, diarrhea for a few days, distal paresthesias more pronounced in his thumbs. Increased fatigue. No mucositis. We have not seen a significant decline in his CEA tumor marker yet although trend is in the right direction. Initial preop value 7.8 in July. Felt to 4.4 postop. Started to rise in October to 10.5 with subsequent rise to 12.6 on November 9. Evaluation at that time showing some early peritoneal nodules. Regimen changed to Xelox. Of note CEA started to rise while he was on single agent Xeloda. First third 2012.dose of Xeloda given December third.most recent. third dose given 12/26/2011.   Medications: reviewed  Allergies: No Known Allergies  Review of Systems: Constitutional:    Increased side effects with recent dose of chemotherapy Respiratory: no dyspnea or cough  Cardiovascular:   no chest pain pressure or  Gastrointestinal: low-grade diarrhea from recent chemotherapy resolved no abdominal pain  Genito-Urinary:  No dysuria or frequent Musculoskeletal:no bone pain  Neurologic: no headache or change in vision Skin: Remaining ROS negative.  Physical Exam: Blood pressure 138/80, pulse 85, temperature 96.9 F (36.1 C), temperature source Oral, height 5\' 7"  (1.702 m), weight 182 lb 9.6 oz (82.827 kg). Wt Readings from Last 3 Encounters:  01/04/12 182 lb 9.6 oz (82.827 kg)  12/05/11 184 lb 6.4 oz (83.643 kg)  11/02/11 187 lb 11.2 oz (85.14 kg)     General appearance:  Well-nourished HENNT:  no erythema exudate or ulcer in the pharynx Lymph nodes:  no adenopathy  Breasts: Lungs: Clear to auscultation resonant to  percussion Heart: regular cardiac rhythm no murmur  Abdomen: soft nontender no mass no organomegaly  Extremities:No edema no calf tenderness Vascular: no cyanosis  Neurologic: mental status intact cranial nerves grossly normal motor strength 5 over 5 reflexes 2+ symmetric  Skin:No rash or ecchymosis   Lab Results: Lab Results  Component Value Date   WBC 9.1 01/04/2012   HGB 14.1 01/04/2012   HCT 40.7 01/04/2012   MCV 96.6 01/04/2012   PLT 170 01/04/2012     Chemistry      Component Value Date/Time   NA 140 12/26/2011 1315   NA 139 10/15/2011 1601   K 4.6 12/26/2011 1315   K 4.3 10/15/2011 1601   CL 106 12/26/2011 1315   CL 101 10/15/2011 1601   CO2 21 12/26/2011 1315   CO2 30 10/15/2011 1601   BUN 16 12/26/2011 1315   BUN 17 10/15/2011 1601   CREATININE 1.09 12/26/2011 1315   CREATININE 1.1 10/15/2011 1601      Component Value Date/Time   CALCIUM 9.7 12/26/2011 1315   CALCIUM 9.3 10/15/2011 1601   ALKPHOS 54 12/26/2011 1315   ALKPHOS 59 10/15/2011 1601   AST 34 12/26/2011 1315   AST 35 10/15/2011 1601   ALT 34 12/26/2011 1315   BILITOT 1.2 12/26/2011 1315   BILITOT 1.30 10/15/2011 1601       Radiological Studies:    Impression and Plan: #1 stage I adenocarcinoma left lung status post left upper lobectomy with brachytherapy radiation June 2007.  #2. 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 We are currently just monitoring this in view of the active chemotherapy treatments for his colon cancer.  #3. Initial stage III T4 N1c no gross nodal metastases but soft tissue involvement adjacent to lymph nodes. Primary tumor at splenic flexure surgically resected 07/09/2011. 4.5 cm moderate to poorly differentiated adenocarcinoma with transmural penetration. 1.2 cm satellite nodule in soft tissue 3 cm from the primary tumor. Rise in CEA with CT findings of 2 small focal soft tissue densities left upper quadrant between liver and spleen and distal stomach with  increased PET activity. Plan: Continue current chemotherapy regimen with Xeloda plus oxaliplatin. Toxicities may become rapidly dose limiting.  #4. Status post bilateral corneal transplants  #5. Type 2 diabetes.  #5. History of kidney stones   CC:. [name]; [name]; [name]; [name]; [name]   Sean Feinstein, MD 1/11/20135:48 PM

## 2012-01-04 NOTE — Telephone Encounter (Signed)
gve the pt his jan,feb 2013 appt calendar °

## 2012-01-07 ENCOUNTER — Telehealth: Payer: Self-pay

## 2012-01-07 NOTE — Telephone Encounter (Signed)
Message copied by Albertha Ghee on Mon Jan 07, 2012 11:38 AM ------      Message from: Levert Feinstein      Created: Sun Jan 06, 2012 10:31 AM       Call pt CEA slowly coming down now 8.6 from peak 12.6

## 2012-01-07 NOTE — Telephone Encounter (Signed)
Message left for pt to call our office for lab results per Dr Patsy Lager note.  dph

## 2012-01-08 ENCOUNTER — Telehealth: Payer: Self-pay | Admitting: *Deleted

## 2012-01-08 ENCOUNTER — Telehealth: Payer: Self-pay | Admitting: Internal Medicine

## 2012-01-08 NOTE — Telephone Encounter (Signed)
Pt left message to call . I called him back and left him a message to return my call.

## 2012-01-08 NOTE — Telephone Encounter (Signed)
Pt returned call today & reported to pt that cea is gradually coming down from 12.6 at peak to 8.6 per Dr. Cyndie Chime.

## 2012-01-08 NOTE — Telephone Encounter (Signed)
Error, no note.

## 2012-01-09 ENCOUNTER — Ambulatory Visit: Payer: Medicare Other | Admitting: Nurse Practitioner

## 2012-01-09 ENCOUNTER — Ambulatory Visit (HOSPITAL_BASED_OUTPATIENT_CLINIC_OR_DEPARTMENT_OTHER): Payer: Medicare Other

## 2012-01-09 ENCOUNTER — Other Ambulatory Visit: Payer: Self-pay | Admitting: Pharmacist

## 2012-01-09 ENCOUNTER — Other Ambulatory Visit: Payer: Medicare Other | Admitting: Lab

## 2012-01-09 VITALS — BP 134/81 | HR 87 | Temp 97.0°F

## 2012-01-09 DIAGNOSIS — C189 Malignant neoplasm of colon, unspecified: Secondary | ICD-10-CM

## 2012-01-09 DIAGNOSIS — Z5111 Encounter for antineoplastic chemotherapy: Secondary | ICD-10-CM

## 2012-01-09 LAB — TECHNOLOGIST REVIEW

## 2012-01-09 LAB — CBC WITH DIFFERENTIAL/PLATELET
BASO%: 0.4 % (ref 0.0–2.0)
EOS%: 5.2 % (ref 0.0–7.0)
HGB: 13.6 g/dL (ref 13.0–17.1)
MCH: 32.9 pg (ref 27.2–33.4)
MCHC: 35.6 g/dL (ref 32.0–36.0)
MONO#: 0.7 10*3/uL (ref 0.1–0.9)
RDW: 17.2 % — ABNORMAL HIGH (ref 11.0–14.6)
WBC: 9.1 10*3/uL (ref 4.0–10.3)
lymph#: 1.5 10*3/uL (ref 0.9–3.3)

## 2012-01-09 LAB — BASIC METABOLIC PANEL
CO2: 25 mEq/L (ref 19–32)
Calcium: 9.5 mg/dL (ref 8.4–10.5)
Creatinine, Ser: 1.1 mg/dL (ref 0.50–1.35)
Sodium: 138 mEq/L (ref 135–145)

## 2012-01-09 MED ORDER — DEXTROSE 5 % IV SOLN
Freq: Once | INTRAVENOUS | Status: AC
  Administered 2012-01-09: 14:00:00 via INTRAVENOUS

## 2012-01-09 MED ORDER — ONDANSETRON 8 MG/50ML IVPB (CHCC)
8.0000 mg | Freq: Once | INTRAVENOUS | Status: AC
  Administered 2012-01-09: 8 mg via INTRAVENOUS

## 2012-01-09 MED ORDER — SODIUM CHLORIDE 0.9 % IV SOLN
Freq: Once | INTRAVENOUS | Status: AC
  Administered 2012-01-09: 50 mL via INTRAVENOUS

## 2012-01-09 MED ORDER — OXALIPLATIN CHEMO INJECTION 100 MG/20ML
85.0000 mg/m2 | Freq: Once | INTRAVENOUS | Status: AC
  Administered 2012-01-09: 170 mg via INTRAVENOUS
  Filled 2012-01-09: qty 34

## 2012-01-09 MED ORDER — DEXAMETHASONE SODIUM PHOSPHATE 10 MG/ML IJ SOLN
10.0000 mg | Freq: Once | INTRAMUSCULAR | Status: AC
  Administered 2012-01-09: 10 mg via INTRAVENOUS

## 2012-01-09 MED ORDER — SODIUM CHLORIDE 0.9 % IV SOLN
5.0000 mg/kg | Freq: Once | INTRAVENOUS | Status: AC
  Administered 2012-01-09: 425 mg via INTRAVENOUS
  Filled 2012-01-09: qty 17

## 2012-01-09 NOTE — Patient Instructions (Addendum)
[facility] Discharge Instructions for Patients Receiving Chemotherapy  Today you received the following chemotherapy agents  Oxaliplatin/Avastin To help prevent nausea and vomiting after your treatment, we encourage you to take your nausea medication per Dr. Arbutus Ped.  If you develop nausea and vomiting that is not controlled by your nausea medication, call the clinic. If it is after clinic hours your family physician or the after hours number for the clinic or go to the Emergency Department.   BELOW ARE SYMPTOMS THAT SHOULD BE REPORTED IMMEDIATELY:  *FEVER GREATER THAN 100.5 F  *CHILLS WITH OR WITHOUT FEVER  NAUSEA AND VOMITING THAT IS NOT CONTROLLED WITH YOUR NAUSEA MEDICATION  *UNUSUAL SHORTNESS OF BREATH  *UNUSUAL BRUISING OR BLEEDING  TENDERNESS IN MOUTH AND THROAT WITH OR WITHOUT PRESENCE OF ULCERS  *URINARY PROBLEMS  *BOWEL PROBLEMS  UNUSUAL RASH Items with * indicate a potential emergency and should be followed up as soon as possible.   Please let the nurse know about any problems that you may have experienced. Feel free to call the clinic you have any questions or concerns. The clinic phone number is [phone].   I have been informed and understand all the instructions given to me. I know to contact the clinic, my physician, or go to the Emergency Department if any problems should occur. I do not have any questions at this time, but understand that I may call the clinic during office hours   should I have any questions or need assistance in obtaining follow up care.    __________________________________________  _____________  __________ Signature of Patient or Authorized Representative            Date                   Time    __________________________________________ Nurse's Signature   Pt aware of next appt.  Ambulates out of dept without difficulty.  dph

## 2012-01-10 ENCOUNTER — Telehealth: Payer: Self-pay | Admitting: *Deleted

## 2012-01-10 NOTE — Telephone Encounter (Signed)
Received confirmation of xeloda shipment 01/09/12 for delivery next business day from [facility].

## 2012-01-11 ENCOUNTER — Other Ambulatory Visit: Payer: Self-pay | Admitting: Certified Registered Nurse Anesthetist

## 2012-01-23 ENCOUNTER — Other Ambulatory Visit (HOSPITAL_BASED_OUTPATIENT_CLINIC_OR_DEPARTMENT_OTHER): Payer: Medicare Other | Admitting: Lab

## 2012-01-23 ENCOUNTER — Other Ambulatory Visit: Payer: Self-pay | Admitting: Oncology

## 2012-01-23 ENCOUNTER — Ambulatory Visit (HOSPITAL_BASED_OUTPATIENT_CLINIC_OR_DEPARTMENT_OTHER): Payer: Medicare Other

## 2012-01-23 VITALS — BP 120/69 | HR 97 | Temp 97.1°F

## 2012-01-23 DIAGNOSIS — Z5112 Encounter for antineoplastic immunotherapy: Secondary | ICD-10-CM

## 2012-01-23 DIAGNOSIS — C185 Malignant neoplasm of splenic flexure: Secondary | ICD-10-CM

## 2012-01-23 DIAGNOSIS — Z5111 Encounter for antineoplastic chemotherapy: Secondary | ICD-10-CM

## 2012-01-23 DIAGNOSIS — C189 Malignant neoplasm of colon, unspecified: Secondary | ICD-10-CM

## 2012-01-23 DIAGNOSIS — C3430 Malignant neoplasm of lower lobe, unspecified bronchus or lung: Secondary | ICD-10-CM

## 2012-01-23 LAB — COMPREHENSIVE METABOLIC PANEL
ALT: 21 U/L (ref 0–53)
AST: 23 U/L (ref 0–37)
Albumin: 4.1 g/dL (ref 3.5–5.2)
Alkaline Phosphatase: 57 U/L (ref 39–117)
BUN: 17 mg/dL (ref 6–23)
Calcium: 9.7 mg/dL (ref 8.4–10.5)
Glucose, Bld: 166 mg/dL — ABNORMAL HIGH (ref 70–99)
Sodium: 141 mEq/L (ref 135–145)
Total Bilirubin: 1.2 mg/dL (ref 0.3–1.2)
Total Protein: 6 g/dL (ref 6.0–8.3)

## 2012-01-23 LAB — CBC WITH DIFFERENTIAL/PLATELET
BASO%: 0.4 % (ref 0.0–2.0)
Basophils Absolute: 0 10*3/uL (ref 0.0–0.1)
EOS%: 4.9 % (ref 0.0–7.0)
HGB: 13.5 g/dL (ref 13.0–17.1)
MCH: 32.5 pg (ref 27.2–33.4)
MCHC: 35.2 g/dL (ref 32.0–36.0)
MCV: 92.1 fL (ref 79.3–98.0)
MONO%: 7.9 % (ref 0.0–14.0)
NEUT%: 66.3 % (ref 39.0–75.0)
RDW: 16.9 % — ABNORMAL HIGH (ref 11.0–14.6)

## 2012-01-23 LAB — LACTATE DEHYDROGENASE: LDH: 227 U/L (ref 94–250)

## 2012-01-23 MED ORDER — ONDANSETRON 8 MG/50ML IVPB (CHCC)
8.0000 mg | Freq: Once | INTRAVENOUS | Status: AC
  Administered 2012-01-23: 8 mg via INTRAVENOUS

## 2012-01-23 MED ORDER — DEXAMETHASONE SODIUM PHOSPHATE 10 MG/ML IJ SOLN
10.0000 mg | Freq: Once | INTRAMUSCULAR | Status: AC
  Administered 2012-01-23: 10 mg via INTRAVENOUS

## 2012-01-23 MED ORDER — OXALIPLATIN CHEMO INJECTION 100 MG/20ML
85.0000 mg/m2 | Freq: Once | INTRAVENOUS | Status: AC
  Administered 2012-01-23: 170 mg via INTRAVENOUS
  Filled 2012-01-23: qty 34

## 2012-01-23 MED ORDER — DEXTROSE 5 % IV SOLN
Freq: Once | INTRAVENOUS | Status: AC
  Administered 2012-01-23: 15:00:00 via INTRAVENOUS

## 2012-01-23 MED ORDER — SODIUM CHLORIDE 0.9 % IV SOLN
5.0000 mg/kg | Freq: Once | INTRAVENOUS | Status: AC
  Administered 2012-01-23: 425 mg via INTRAVENOUS
  Filled 2012-01-23: qty 17

## 2012-01-23 MED ORDER — SODIUM CHLORIDE 0.9 % IV SOLN
Freq: Once | INTRAVENOUS | Status: AC
  Administered 2012-01-23: 14:00:00 via INTRAVENOUS

## 2012-01-29 ENCOUNTER — Other Ambulatory Visit: Payer: Self-pay | Admitting: *Deleted

## 2012-01-29 NOTE — Telephone Encounter (Signed)
THIS REQUEST WAS GIVEN TO DR.GRANFORTUNA'S NURSE, MYRTLE HARDIN,RN. 

## 2012-02-03 ENCOUNTER — Other Ambulatory Visit: Payer: Self-pay | Admitting: Oncology

## 2012-02-03 DIAGNOSIS — C189 Malignant neoplasm of colon, unspecified: Secondary | ICD-10-CM

## 2012-02-04 ENCOUNTER — Other Ambulatory Visit: Payer: Self-pay

## 2012-02-04 DIAGNOSIS — C189 Malignant neoplasm of colon, unspecified: Secondary | ICD-10-CM

## 2012-02-04 MED ORDER — CAPECITABINE 500 MG PO TABS: 2000.0000 mg | ORAL_TABLET | Freq: Two times a day (BID) | ORAL | Status: DC

## 2012-02-05 ENCOUNTER — Encounter: Payer: Self-pay | Admitting: *Deleted

## 2012-02-05 NOTE — Progress Notes (Signed)
ON 02/04/12 RECEIVED A FAX FROM [facility] CONCERNING A CONFIRMATION OF FACSIMILE RECEIPT. 

## 2012-02-06 ENCOUNTER — Other Ambulatory Visit: Payer: Self-pay | Admitting: Oncology

## 2012-02-06 ENCOUNTER — Ambulatory Visit (HOSPITAL_BASED_OUTPATIENT_CLINIC_OR_DEPARTMENT_OTHER): Payer: Medicare Other | Admitting: Nurse Practitioner

## 2012-02-06 ENCOUNTER — Ambulatory Visit (HOSPITAL_BASED_OUTPATIENT_CLINIC_OR_DEPARTMENT_OTHER): Payer: Medicare Other

## 2012-02-06 ENCOUNTER — Other Ambulatory Visit (HOSPITAL_BASED_OUTPATIENT_CLINIC_OR_DEPARTMENT_OTHER): Payer: Medicare Other | Admitting: Lab

## 2012-02-06 ENCOUNTER — Other Ambulatory Visit: Payer: Self-pay | Admitting: Nurse Practitioner

## 2012-02-06 ENCOUNTER — Telehealth: Payer: Self-pay | Admitting: Oncology

## 2012-02-06 VITALS — BP 138/82 | HR 91 | Temp 96.7°F | Ht 67.0 in | Wt 179.0 lb

## 2012-02-06 VITALS — BP 123/72 | HR 93

## 2012-02-06 DIAGNOSIS — C3410 Malignant neoplasm of upper lobe, unspecified bronchus or lung: Secondary | ICD-10-CM

## 2012-02-06 DIAGNOSIS — C189 Malignant neoplasm of colon, unspecified: Secondary | ICD-10-CM

## 2012-02-06 DIAGNOSIS — C3430 Malignant neoplasm of lower lobe, unspecified bronchus or lung: Secondary | ICD-10-CM

## 2012-02-06 DIAGNOSIS — C185 Malignant neoplasm of splenic flexure: Secondary | ICD-10-CM

## 2012-02-06 DIAGNOSIS — Z5111 Encounter for antineoplastic chemotherapy: Secondary | ICD-10-CM

## 2012-02-06 LAB — CBC WITH DIFFERENTIAL/PLATELET
Basophils Absolute: 0 10*3/uL (ref 0.0–0.1)
EOS%: 3.4 % (ref 0.0–7.0)
HCT: 38 % — ABNORMAL LOW (ref 38.4–49.9)
HGB: 13.7 g/dL (ref 13.0–17.1)
MCH: 33.2 pg (ref 27.2–33.4)
MONO#: 0.6 10*3/uL (ref 0.1–0.9)
NEUT%: 68.7 % (ref 39.0–75.0)
lymph#: 1.3 10*3/uL (ref 0.9–3.3)

## 2012-02-06 LAB — BASIC METABOLIC PANEL
BUN: 15 mg/dL (ref 6–23)
CO2: 23 mEq/L (ref 19–32)
Chloride: 106 mEq/L (ref 96–112)
Glucose, Bld: 154 mg/dL — ABNORMAL HIGH (ref 70–99)
Potassium: 4 mEq/L (ref 3.5–5.3)

## 2012-02-06 MED ORDER — DEXTROSE 5 % IV SOLN
Freq: Once | INTRAVENOUS | Status: AC
  Administered 2012-02-06: 14:00:00 via INTRAVENOUS

## 2012-02-06 MED ORDER — SODIUM CHLORIDE 0.9 % IJ SOLN
3.0000 mL | INTRAMUSCULAR | Status: DC | PRN
  Filled 2012-02-06: qty 10

## 2012-02-06 MED ORDER — DEXAMETHASONE SODIUM PHOSPHATE 10 MG/ML IJ SOLN
10.0000 mg | Freq: Once | INTRAMUSCULAR | Status: AC
Start: 2012-02-06 — End: 2012-02-06
  Administered 2012-02-06: 10 mg via INTRAVENOUS

## 2012-02-06 MED ORDER — ONDANSETRON 8 MG/50ML IVPB (CHCC)
8.0000 mg | Freq: Once | INTRAVENOUS | Status: AC
  Administered 2012-02-06: 8 mg via INTRAVENOUS

## 2012-02-06 MED ORDER — SODIUM CHLORIDE 0.9 % IV SOLN
5.0000 mg/kg | Freq: Once | INTRAVENOUS | Status: AC
  Administered 2012-02-06: 425 mg via INTRAVENOUS
  Filled 2012-02-06: qty 17

## 2012-02-06 MED ORDER — OXALIPLATIN CHEMO INJECTION 100 MG/20ML
85.0000 mg/m2 | Freq: Once | INTRAVENOUS | Status: AC
  Administered 2012-02-06: 170 mg via INTRAVENOUS
  Filled 2012-02-06: qty 34

## 2012-02-06 MED ORDER — SODIUM CHLORIDE 0.9 % IV SOLN
Freq: Once | INTRAVENOUS | Status: AC
  Administered 2012-02-06: 17:00:00 via INTRAVENOUS

## 2012-02-06 NOTE — Patient Instructions (Signed)
[facility] Discharge Instructions for Patients Receiving Chemotherapy  Today you received the following chemotherapy agents OXALIPLATIN, AVASTIN To help prevent nausea and vomiting after your treatment, we encourage you to take your nausea medication Begin taking it at  as prescribed   If you develop nausea and vomiting that is not controlled by your nausea medication, call the clinic. If it is after clinic hours your family physician or the after hours number for the clinic or go to the Emergency Department.   BELOW ARE SYMPTOMS THAT SHOULD BE REPORTED IMMEDIATELY:  *FEVER GREATER THAN 100.5 F  *CHILLS WITH OR WITHOUT FEVER  NAUSEA AND VOMITING THAT IS NOT CONTROLLED WITH YOUR NAUSEA MEDICATION  *UNUSUAL SHORTNESS OF BREATH  *UNUSUAL BRUISING OR BLEEDING  TENDERNESS IN MOUTH AND THROAT WITH OR WITHOUT PRESENCE OF ULCERS  *URINARY PROBLEMS  *BOWEL PROBLEMS  UNUSUAL RASH Items with * indicate a potential emergency and should be followed up as soon as possible.  One of the nurses will contact you 24 hours after your treatment. Please let the nurse know about any problems that you may have experienced. Feel free to call the clinic you have any questions or concerns. The clinic phone number is [phone].   I have been informed and understand all the instructions given to me. I know to contact the clinic, my physician, or go to the Emergency Department if any problems should occur. I do not have any questions at this time, but understand that I may call the clinic during office hours   should I have any questions or need assistance in obtaining follow up care.    __________________________________________  _____________  __________ Signature of Patient or Authorized Representative            Date                   Time    __________________________________________ Nurse's Signature

## 2012-02-06 NOTE — Telephone Encounter (Signed)
gv pt appt schedule for feb thru march including ct for 2/20.

## 2012-02-06 NOTE — Progress Notes (Signed)
OFFICE PROGRESS NOTE  Interval history:  [name] is a 71 year old man with colon cancer. Restaging CT evaluation 10/15/2011 showed stable focal groundglass opacity in the right lower lobe; slight enlargement of a few soft tissue nodules in the left upper quadrant. There was no evidence of hepatic metastatic disease. PET scan 10/30/2011 showed the nodule at the right lung base with mild to moderate FDG activity concerning for a low-grade adenocarcinoma; uptake at the colonic anastomosis felt to be postsurgical inflammation; intensely enhancing peritoneal nodule along the greater curvature of the stomach concerning for residual active metastasis; a small nodule adjacent to the spleen also concerning for metastasis. CEA on 11/02/2011 was elevated at 12.6.  He began treatment with XELOX plus Avastin on a 2 week schedule 11/26/2011. The CEA has steadily declined since beginning this regimen to a most recent value of 5.2 on 01/23/2012.  [name] reports cold sensitivity lasting approximately 10 days after the most recent chemotherapy treatment. He denies persistent neuropathy symptoms. He did note tingling in the left arm for approximately 2 days after the oxaliplatin was administered. The chemotherapy is administered peripherally. He does not have a Port-A-Cath. He denies mouth sores. No diarrhea. No hand or foot pain or redness. He notes an alteration in taste. He denies bleeding. No abdominal pain. No shortness of breath or chest pain. He denies leg swelling. He has occasional cramps in the calves.   Objective: Blood pressure 138/82, pulse 91, temperature 96.7 F (35.9 C), temperature source Oral, height 5\' 7"  (1.702 m), weight 179 lb (81.194 kg).  Oropharynx is without thrush or ulceration. Lungs are clear. No wheezes or rales. Regular cardiac rhythm. Abdomen is soft and nontender. No hepatomegaly. Extremities are without edema. Calves soft and nontender. Motor strength is 5 over 5. Vibratory sense is  mildly decreased over the fingertips bilaterally.  Lab Results: Lab Results  Component Value Date   WBC 7.0 02/06/2012   HGB 13.7 02/06/2012   HCT 38.0* 02/06/2012   MCV 92.0 02/06/2012   PLT 135* 02/06/2012    Chemistry:    Chemistry      Component Value Date/Time   NA 141 01/23/2012 1310   NA 139 10/15/2011 1601   K 4.0 01/23/2012 1310   K 4.3 10/15/2011 1601   CL 106 01/23/2012 1310   CL 101 10/15/2011 1601   CO2 21 01/23/2012 1310   CO2 30 10/15/2011 1601   BUN 17 01/23/2012 1310   BUN 17 10/15/2011 1601   CREATININE 1.05 01/23/2012 1310   CREATININE 1.1 10/15/2011 1601      Component Value Date/Time   CALCIUM 9.7 01/23/2012 1310   CALCIUM 9.3 10/15/2011 1601   ALKPHOS 57 01/23/2012 1310   ALKPHOS 59 10/15/2011 1601   AST 23 01/23/2012 1310   AST 35 10/15/2011 1601   ALT 21 01/23/2012 1310   BILITOT 1.2 01/23/2012 1310   BILITOT 1.30 10/15/2011 1601       Studies/Results: No results found.  Medications: I have reviewed the patient's current medications.  Assessment/Plan:  1. Stage III colon cancer (T4 N1c) status post left colectomy 07/09/2011.  He began adjuvant Xeloda chemotherapy 07/30/2011.  He had a rise in the CEA while on Xeloda. Restaging CT scans 10/15/2011 showed stable focal groundglass opacity in the right lower lobe; a few soft tissue nodules in the left upper quadrant of the abdomen had slightly enlarged compared with the prior studies; a small nodule medial to the spleen also appeared slightly more prominent. PET scan  10/30/2011 showed an intensely enhancing peritoneal nodule along the greater curvature of the stomach concerning for residual active metastasis; the small nodule adjacent to the spleen was also concerning for metastasis; uptake at the colonic anastomosis was felt to be postsurgical inflammation; a nodule at the right lung base showed mild to moderate FDG uptake and was concerning for a low-grade adenocarcinoma. He began XELOX plus Avastin on a 2 week  schedule on 11/26/2011. He has completed 5 cycles. The CEA was improved on 01/23/2012. 2. 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 3. 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 Stable on chest CT 10/15/2011. 4. Type 2 diabetes.  5.  History of kidney stones. 6. Status post bilateral corneal transplants. 7. IV access. We discussed placement of a Port-A-Cath. He will consider this and let us know his decision.  Disposition-[name] appears stable. Plan to proceed with cycle 6 XELOX plus Avastin today as scheduled. We are referring him for a restaging CT evaluation in the next 2 weeks. We will tentatively schedule him for the next cycle of chemotherapy 02/20/2012. He will return for a followup visit with Dr. Cyndie Chime 03/14/2012. He will contact the office the interim with any problems.  Plan reviewed with Dr. Cyndie Chime.  Lonna Cobb ANP/GNP-BC

## 2012-02-06 NOTE — Progress Notes (Signed)
Reports skin sensitivity to touch along vein that oxaliplatin is infusing. States arm tingles along vein above IV site. He states this happens with each IV oxaliplatin treatment. I decreased rate of infusion. No sign of infiltration . He still feels symptom with decreased rate.  He also reports he gets hoarse at end of oxaliplatin.

## 2012-02-12 NOTE — Progress Notes (Signed)
Received fax notification from [facility] that pt's Xeloda shipped on 2/18 for delivery today 2/19.  dph

## 2012-02-13 ENCOUNTER — Ambulatory Visit (HOSPITAL_COMMUNITY)
Admission: RE | Admit: 2012-02-13 | Discharge: 2012-02-13 | Disposition: A | Discharge status: Home / Self Care | Payer: Medicare Other | Source: Ambulatory Visit | Attending: Nurse Practitioner | Admitting: Nurse Practitioner

## 2012-02-13 DIAGNOSIS — Z79899 Other long term (current) drug therapy: Secondary | ICD-10-CM | POA: Insufficient documentation

## 2012-02-13 DIAGNOSIS — K4090 Unilateral inguinal hernia, without obstruction or gangrene, not specified as recurrent: Secondary | ICD-10-CM | POA: Insufficient documentation

## 2012-02-13 DIAGNOSIS — R911 Solitary pulmonary nodule: Secondary | ICD-10-CM | POA: Insufficient documentation

## 2012-02-13 DIAGNOSIS — K5730 Diverticulosis of large intestine without perforation or abscess without bleeding: Secondary | ICD-10-CM | POA: Insufficient documentation

## 2012-02-13 DIAGNOSIS — C786 Secondary malignant neoplasm of retroperitoneum and peritoneum: Secondary | ICD-10-CM | POA: Insufficient documentation

## 2012-02-13 DIAGNOSIS — C189 Malignant neoplasm of colon, unspecified: Secondary | ICD-10-CM | POA: Insufficient documentation

## 2012-02-13 DIAGNOSIS — I712 Thoracic aortic aneurysm, without rupture, unspecified: Secondary | ICD-10-CM | POA: Insufficient documentation

## 2012-02-13 DIAGNOSIS — J438 Other emphysema: Secondary | ICD-10-CM | POA: Insufficient documentation

## 2012-02-13 DIAGNOSIS — Z85118 Personal history of other malignant neoplasm of bronchus and lung: Secondary | ICD-10-CM | POA: Insufficient documentation

## 2012-02-13 DIAGNOSIS — Z902 Acquired absence of lung [part of]: Secondary | ICD-10-CM | POA: Insufficient documentation

## 2012-02-13 MED ORDER — IOHEXOL 300 MG/ML  SOLN
100.0000 mL | Freq: Once | INTRAMUSCULAR | Status: AC | PRN
  Administered 2012-02-13: 100 mL via INTRAVENOUS

## 2012-02-14 ENCOUNTER — Telehealth: Payer: Self-pay | Admitting: *Deleted

## 2012-02-14 NOTE — Telephone Encounter (Signed)
Notified pt that recent scan good per Dr. Cyndie Chime & abd. Nodules which were small to begin with are smaller, no new nodules, & nodules in lungs have not grown either.  He expressed understanding & appreciation.

## 2012-02-14 NOTE — Telephone Encounter (Signed)
Message copied by Sabino Snipes on Thu Feb 14, 2012 12:41 PM ------      Message from: Levert Feinstein      Created: Thu Feb 14, 2012 10:30 AM       Call pt - scan good - abdominal nodules which were small too begin with, are smaller.  No new nodules.  Nodule in lungs has not grown either.

## 2012-02-19 ENCOUNTER — Other Ambulatory Visit: Payer: Self-pay | Admitting: Oncology

## 2012-02-20 ENCOUNTER — Other Ambulatory Visit: Payer: Medicare Other

## 2012-02-20 ENCOUNTER — Ambulatory Visit (HOSPITAL_BASED_OUTPATIENT_CLINIC_OR_DEPARTMENT_OTHER): Payer: Medicare Other

## 2012-02-20 VITALS — BP 120/76 | HR 84 | Temp 97.0°F

## 2012-02-20 DIAGNOSIS — C189 Malignant neoplasm of colon, unspecified: Secondary | ICD-10-CM

## 2012-02-20 DIAGNOSIS — Z5111 Encounter for antineoplastic chemotherapy: Secondary | ICD-10-CM

## 2012-02-20 DIAGNOSIS — Z5112 Encounter for antineoplastic immunotherapy: Secondary | ICD-10-CM

## 2012-02-20 LAB — CBC WITH DIFFERENTIAL/PLATELET
BASO%: 0.6 % (ref 0.0–2.0)
Eosinophils Absolute: 0.3 10*3/uL (ref 0.0–0.5)
LYMPH%: 17.8 % (ref 14.0–49.0)
MCHC: 35.5 g/dL (ref 32.0–36.0)
MONO#: 0.9 10*3/uL (ref 0.1–0.9)
NEUT#: 4.5 10*3/uL (ref 1.5–6.5)
Platelets: 132 10*3/uL — ABNORMAL LOW (ref 140–400)
RBC: 4.13 10*6/uL — ABNORMAL LOW (ref 4.20–5.82)
WBC: 7 10*3/uL (ref 4.0–10.3)
lymph#: 1.2 10*3/uL (ref 0.9–3.3)
nRBC: 0 % (ref 0–0)

## 2012-02-20 LAB — COMPREHENSIVE METABOLIC PANEL
ALT: 20 U/L (ref 0–53)
AST: 25 U/L (ref 0–37)
Albumin: 3.3 g/dL — ABNORMAL LOW (ref 3.5–5.2)
CO2: 27 mEq/L (ref 19–32)
Calcium: 9.9 mg/dL (ref 8.4–10.5)
Chloride: 104 mEq/L (ref 96–112)
Potassium: 4.1 mEq/L (ref 3.5–5.3)
Sodium: 138 mEq/L (ref 135–145)
Total Protein: 6.3 g/dL (ref 6.0–8.3)

## 2012-02-20 LAB — UA PROTEIN, DIPSTICK - CHCC: Protein, Urine: <30 mg/dL

## 2012-02-20 MED ORDER — DEXTROSE 5 % IV SOLN
85.0000 mg/m2 | Freq: Once | INTRAVENOUS | Status: AC
  Administered 2012-02-20: 170 mg via INTRAVENOUS
  Filled 2012-02-20: qty 34

## 2012-02-20 MED ORDER — ONDANSETRON 8 MG/50ML IVPB (CHCC)
8.0000 mg | Freq: Once | INTRAVENOUS | Status: AC
  Administered 2012-02-20: 8 mg via INTRAVENOUS

## 2012-02-20 MED ORDER — SODIUM CHLORIDE 0.9 % IV SOLN
5.0000 mg/kg | Freq: Once | INTRAVENOUS | Status: AC
  Administered 2012-02-20: 425 mg via INTRAVENOUS
  Filled 2012-02-20: qty 17

## 2012-02-20 MED ORDER — DEXTROSE 5 % IV SOLN
Freq: Once | INTRAVENOUS | Status: AC
  Administered 2012-02-20: 14:00:00 via INTRAVENOUS

## 2012-02-20 MED ORDER — DEXAMETHASONE SODIUM PHOSPHATE 10 MG/ML IJ SOLN
10.0000 mg | Freq: Once | INTRAMUSCULAR | Status: AC
  Administered 2012-02-20: 10 mg via INTRAVENOUS

## 2012-02-20 MED ORDER — SODIUM CHLORIDE 0.9 % IJ SOLN
10.0000 mL | INTRAMUSCULAR | Status: DC | PRN
  Filled 2012-02-20: qty 10

## 2012-02-20 MED ORDER — SODIUM CHLORIDE 0.9 % IV SOLN
Freq: Once | INTRAVENOUS | Status: AC
  Administered 2012-02-20: 14:00:00 via INTRAVENOUS

## 2012-02-20 NOTE — Patient Instructions (Signed)
Pt aware of future appts.  Ambulatory, no complaints.  SLJ

## 2012-02-20 NOTE — Progress Notes (Signed)
During assessment pt c/o continued cold sensitivity that has lingered longer then usual.  Denies neuropathy.  Per Dr Cyndie Chime, okay to proceed with treatment.  SLJ

## 2012-02-21 ENCOUNTER — Other Ambulatory Visit: Payer: Self-pay | Admitting: Certified Registered Nurse Anesthetist

## 2012-03-05 ENCOUNTER — Other Ambulatory Visit (HOSPITAL_BASED_OUTPATIENT_CLINIC_OR_DEPARTMENT_OTHER): Payer: Medicare Other | Admitting: Lab

## 2012-03-05 ENCOUNTER — Ambulatory Visit (HOSPITAL_BASED_OUTPATIENT_CLINIC_OR_DEPARTMENT_OTHER): Payer: Medicare Other

## 2012-03-05 VITALS — BP 125/70 | HR 87 | Temp 97.9°F

## 2012-03-05 DIAGNOSIS — Z5111 Encounter for antineoplastic chemotherapy: Secondary | ICD-10-CM

## 2012-03-05 DIAGNOSIS — C189 Malignant neoplasm of colon, unspecified: Secondary | ICD-10-CM

## 2012-03-05 DIAGNOSIS — C185 Malignant neoplasm of splenic flexure: Secondary | ICD-10-CM

## 2012-03-05 LAB — COMPREHENSIVE METABOLIC PANEL
ALT: 19 U/L (ref 0–53)
AST: 23 U/L (ref 0–37)
Albumin: 3.4 g/dL — ABNORMAL LOW (ref 3.5–5.2)
Alkaline Phosphatase: 43 U/L (ref 39–117)
Calcium: 10.1 mg/dL (ref 8.4–10.5)
Chloride: 107 mEq/L (ref 96–112)
Creatinine, Ser: 1.13 mg/dL (ref 0.50–1.35)
Potassium: 3.9 mEq/L (ref 3.5–5.3)

## 2012-03-05 LAB — CBC WITH DIFFERENTIAL/PLATELET
BASO%: 0.5 % (ref 0.0–2.0)
EOS%: 4.5 % (ref 0.0–7.0)
MCH: 33.4 pg (ref 27.2–33.4)
MCHC: 35.8 g/dL (ref 32.0–36.0)
MONO%: 9.3 % (ref 0.0–14.0)
RDW: 17.1 % — ABNORMAL HIGH (ref 11.0–14.6)
lymph#: 1.2 10*3/uL (ref 0.9–3.3)

## 2012-03-05 MED ORDER — SODIUM CHLORIDE 0.9 % IV SOLN
5.0000 mg/kg | Freq: Once | INTRAVENOUS | Status: AC
  Administered 2012-03-05: 425 mg via INTRAVENOUS
  Filled 2012-03-05: qty 17

## 2012-03-05 MED ORDER — DEXTROSE 5 % IV SOLN
Freq: Once | INTRAVENOUS | Status: AC
  Administered 2012-03-05: 15:00:00 via INTRAVENOUS

## 2012-03-05 MED ORDER — OXALIPLATIN CHEMO INJECTION 100 MG/20ML
85.0000 mg/m2 | Freq: Once | INTRAVENOUS | Status: AC
  Administered 2012-03-05: 170 mg via INTRAVENOUS
  Filled 2012-03-05: qty 34

## 2012-03-05 MED ORDER — SODIUM CHLORIDE 0.9 % IV SOLN
Freq: Once | INTRAVENOUS | Status: AC
  Administered 2012-03-05: 14:00:00 via INTRAVENOUS

## 2012-03-05 MED ORDER — DEXAMETHASONE SODIUM PHOSPHATE 10 MG/ML IJ SOLN
10.0000 mg | Freq: Once | INTRAMUSCULAR | Status: AC
  Administered 2012-03-05: 10 mg via INTRAVENOUS

## 2012-03-05 MED ORDER — ONDANSETRON 8 MG/50ML IVPB (CHCC)
8.0000 mg | Freq: Once | INTRAVENOUS | Status: AC
  Administered 2012-03-05: 8 mg via INTRAVENOUS

## 2012-03-05 NOTE — Progress Notes (Signed)
Patient reports pain in arms near end of Oxaliplatin infusion each treatment. Requests RN start 2nd IV in his other arm and infuse 2nd half of oxaliplatin into this site to see if this helps the pain. RN agreed to this plan and also wrapped arm in warm towel during infusion. When infusion completed he reports feeling slight discomfort in both arms, but much less than with prior infusions. Strongly encouraged him to pursue the [facility].

## 2012-03-05 NOTE — Patient Instructions (Addendum)
[facility] Discharge Instructions for Patients Receiving Chemotherapy  Today you received the following chemotherapy agents: Avastin & Oxaliplatin   To help prevent nausea and vomiting after your treatment, we encourage you to take your nausea medication as needed- Zofran 8 mg ODT every 8 hours if needed    If you develop nausea and vomiting that is not controlled by your nausea medication, call the clinic. If it is after clinic hours your family physician or the after hours number for the clinic or go to the Emergency Department.   BELOW ARE SYMPTOMS THAT SHOULD BE REPORTED IMMEDIATELY:  *FEVER GREATER THAN 100.5 F  *CHILLS WITH OR WITHOUT FEVER  NAUSEA AND VOMITING THAT IS NOT CONTROLLED WITH YOUR NAUSEA MEDICATION  *UNUSUAL SHORTNESS OF BREATH  *UNUSUAL BRUISING OR BLEEDING  TENDERNESS IN MOUTH AND THROAT WITH OR WITHOUT PRESENCE OF ULCERS  *URINARY PROBLEMS  *BOWEL PROBLEMS  UNUSUAL RASH Items with * indicate a potential emergency and should be followed up as soon as possible.  Remember to avoid cold  Feel free to call the clinic you have any questions or concerns. The clinic phone number is [phone].   I have been informed and understand all the instructions given to me. I know to contact the clinic, my physician, or go to the Emergency Department if any problems should occur. I do not have any questions at this time, but understand that I may call the clinic during office hours   should I have any questions or need assistance in obtaining follow up care.    __________________________________________  _____________  __________ Signature of Patient or Authorized Representative            Date                   Time    __________________________________________ Nurse's Signature

## 2012-03-13 ENCOUNTER — Encounter (INDEPENDENT_AMBULATORY_CARE_PROVIDER_SITE_OTHER): Payer: Self-pay | Admitting: Surgery

## 2012-03-13 DIAGNOSIS — Z460 Encounter for fitting and adjustment of spectacles and contact lenses: Secondary | ICD-10-CM | POA: Insufficient documentation

## 2012-03-14 ENCOUNTER — Other Ambulatory Visit (HOSPITAL_BASED_OUTPATIENT_CLINIC_OR_DEPARTMENT_OTHER): Payer: Medicare Other

## 2012-03-14 ENCOUNTER — Encounter: Payer: Self-pay | Admitting: *Deleted

## 2012-03-14 ENCOUNTER — Telehealth: Payer: Self-pay | Admitting: Oncology

## 2012-03-14 ENCOUNTER — Ambulatory Visit (HOSPITAL_BASED_OUTPATIENT_CLINIC_OR_DEPARTMENT_OTHER): Payer: Medicare Other | Admitting: Oncology

## 2012-03-14 VITALS — BP 110/70 | HR 85 | Temp 96.6°F | Ht 67.0 in | Wt 170.7 lb

## 2012-03-14 DIAGNOSIS — C185 Malignant neoplasm of splenic flexure: Secondary | ICD-10-CM

## 2012-03-14 DIAGNOSIS — C189 Malignant neoplasm of colon, unspecified: Secondary | ICD-10-CM

## 2012-03-14 DIAGNOSIS — R911 Solitary pulmonary nodule: Secondary | ICD-10-CM

## 2012-03-14 DIAGNOSIS — C342 Malignant neoplasm of middle lobe, bronchus or lung: Secondary | ICD-10-CM

## 2012-03-14 DIAGNOSIS — Z85038 Personal history of other malignant neoplasm of large intestine: Secondary | ICD-10-CM

## 2012-03-14 DIAGNOSIS — C3410 Malignant neoplasm of upper lobe, unspecified bronchus or lung: Secondary | ICD-10-CM

## 2012-03-14 DIAGNOSIS — E119 Type 2 diabetes mellitus without complications: Secondary | ICD-10-CM

## 2012-03-14 DIAGNOSIS — D649 Anemia, unspecified: Secondary | ICD-10-CM

## 2012-03-14 LAB — CBC WITH DIFFERENTIAL/PLATELET
Eosinophils Absolute: 0.2 10*3/uL (ref 0.0–0.5)
LYMPH%: 18.3 % (ref 14.0–49.0)
MONO#: 0.7 10*3/uL (ref 0.1–0.9)
NEUT#: 3.9 10*3/uL (ref 1.5–6.5)
Platelets: 145 10*3/uL (ref 140–400)
RBC: 3.94 10*6/uL — ABNORMAL LOW (ref 4.20–5.82)
RDW: 16.9 % — ABNORMAL HIGH (ref 11.0–14.6)
WBC: 6 10*3/uL (ref 4.0–10.3)

## 2012-03-14 LAB — COMPREHENSIVE METABOLIC PANEL
Albumin: 3.5 g/dL (ref 3.5–5.2)
CO2: 23 mEq/L (ref 19–32)
Glucose, Bld: 128 mg/dL — ABNORMAL HIGH (ref 70–99)
Potassium: 3.9 mEq/L (ref 3.5–5.3)
Sodium: 137 mEq/L (ref 135–145)
Total Protein: 6.7 g/dL (ref 6.0–8.3)

## 2012-03-14 LAB — LACTATE DEHYDROGENASE: LDH: 245 U/L (ref 94–250)

## 2012-03-14 NOTE — Telephone Encounter (Signed)
gv pt appt schedule for march/may including ct for 5/21. Lm for michelle to add tx for 4/10. Pt aware and will get appt for 4/10 when he comes in 3/27.

## 2012-03-14 NOTE — Progress Notes (Signed)
Received fax from [employer] that pt's rx has been shipped on 03/13/12.  SLJ 

## 2012-03-15 ENCOUNTER — Other Ambulatory Visit: Payer: Self-pay | Admitting: Oncology

## 2012-03-15 NOTE — Progress Notes (Signed)
Hematology and Oncology Follow Up Visit  [name] [id number] 04-Apr-1941 71 y.o. 03/15/2012 1:53 PM   Principle Diagnosis: Encounter Diagnoses  Name Primary?  . H/O colon cancer, stage IV Yes  . Lung cancer, upper lobe   . Lung cancer, middle lobe   . Colon adenocarcinoma      Interim History:   A followup visit for this 71 year old man with local intra-abdominal recurrence of colon cancer initial stage III and diagnosed in in July 2012 status post left colectomy 07/09/2011. He developed a rise in CEA tumor marker and 2 intra-abdominal soft tissue densities on followup CT scan done in November 2012 while on adjuvant Xeloda chemotherapy. Oxaliplatin and Avastin were added to his regimen. Oxaliplatinum was started 11/26/2011. He has had 8 every 2 week treatments through 03/05/2012. Overall he is tolerating treatments well with minimal nausea, no significant neuropathy, no paresthesias, no diarrhea, no mucositis. His main side effect has been cold intolerance and a peculiar infusion reaction where he gets significant pain in his arm where the drug is infused which radiates for the entire arm and is not influenced by slowing down the infusion. He asked the nurses to split the infusion for his last treatment and give one hour and 1 arm one hour and the other arm. This did seem to make a significant difference and minimize the discomfort.  Medications: reviewed  Allergies: No Known Allergies  Review of Systems: Constitutional:   Some fatigue but he is still working full-time Respiratory: No cough or dyspnea Cardiovascular:  No chest pain or palpitations Gastrointestinal: Occasional loose bowel movement but no diarrhea no hematochezia Genito-Urinary: No urinary tract symptoms Musculoskeletal: No muscle or bone pain Neurologic: No headache or change in vision Skin: No rash Remaining ROS negative.  Physical Exam: Blood pressure 110/70, pulse 85, temperature 96.6 F (35.9 C), temperature  source Oral, height 5\' 7"  (1.702 m), weight 170 lb 11.2 oz (77.429 kg). Wt Readings from Last 3 Encounters:  03/14/12 170 lb 11.2 oz (77.429 kg)  02/06/12 179 lb (81.194 kg)  01/04/12 182 lb 9.6 oz (82.827 kg)     General appearance: He has lost some weight HENNT: Pharynx no erythema exudate or ulcer Lymph nodes: No adenopathy Breasts: Lungs: Clear to auscultation resonant to percussion Heart: Regular rhythm no murmur Abdomen: Soft nontender no mass no organomegaly Extremities: No edema no calf tenderness Vascular: No cyanosis Neurologic: No focal deficit. Vibration sense is intact over the fingertips by tuning fork exam Skin: No rash or ecchymosis  Lab Results: Lab Results  Component Value Date   WBC 6.0 03/14/2012   HGB 13.4 03/14/2012   HCT 38.8 03/14/2012   MCV 98.5* 03/14/2012   PLT 145 03/14/2012     Chemistry      Component Value Date/Time   NA 137 03/14/2012 1533   NA 139 10/15/2011 1601   K 3.9 03/14/2012 1533   K 4.3 10/15/2011 1601   CL 104 03/14/2012 1533   CL 101 10/15/2011 1601   CO2 23 03/14/2012 1533   CO2 30 10/15/2011 1601   BUN 14 03/14/2012 1533   BUN 17 10/15/2011 1601   CREATININE 1.06 03/14/2012 1533   CREATININE 1.1 10/15/2011 1601      Component Value Date/Time   CALCIUM 10.2 03/14/2012 1533   CALCIUM 9.3 10/15/2011 1601   ALKPHOS 47 03/14/2012 1533   ALKPHOS 59 10/15/2011 1601   AST 26 03/14/2012 1533   AST 35 10/15/2011 1601   ALT 23 03/14/2012 1533  BILITOT 0.6 03/14/2012 1533   BILITOT 1.30 10/15/2011 1601    CEA: Has now normalized in his 4 units!   Impression and Plan: #1. Intra-abdominal recurrence of colon cancer. Objective and chemical response to a combination of Xeloda, oxaliplatin him, and Avastin. In view of the fact that he is diabetic and that I anticipate additive neurotoxicity from oxaliplatin him, I plan to give 4 additional cycles with the oxaliplatin him and then delete this drug from the regimen and just continue Xeloda plus  Avastin maintenance.  #2. 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 Stable on chest CT 10/15/2011.   #3.Type 2 diabetes.   #4.History of kidney stones.  #5. Status post bilateral corneal transplants.    CC:.    Levert Feinstein, MD 3/23/20131:53 PM

## 2012-03-18 ENCOUNTER — Encounter (INDEPENDENT_AMBULATORY_CARE_PROVIDER_SITE_OTHER): Payer: Self-pay | Admitting: Surgery

## 2012-03-18 ENCOUNTER — Ambulatory Visit (INDEPENDENT_AMBULATORY_CARE_PROVIDER_SITE_OTHER): Payer: Medicare Other | Admitting: Surgery

## 2012-03-18 VITALS — BP 134/78 | HR 92 | Temp 97.8°F | Resp 18 | Ht 67.0 in | Wt 171.4 lb

## 2012-03-18 DIAGNOSIS — C189 Malignant neoplasm of colon, unspecified: Secondary | ICD-10-CM

## 2012-03-18 NOTE — Progress Notes (Signed)
Visit Diagnoses: 1. Colon cancer     HISTORY: The patient is a 70 year old white male followed for metastatic colorectal carcinoma. Patient had undergone a segmental resection of it it locally advanced colon cancer. Shortly following his procedure he was identified with 2 intra-abdominal metastases. Patient has been receiving adjuvant chemotherapy. There has been a nice response with a significant fall in his CEA level and radiographic evidence of response to chemotherapy. No new intra-abdominal lesions have been identified.  Patient remains active. He continues to Audiological scientist.  He is currently on chemotherapy every other week. Plans are to complete chemotherapy in May 2013 and then restage radiographically at that point.  PERTINENT REVIEW OF SYSTEMS: Patient has noted significant weight loss. His energy level has been good. He denies abdominal pain. He notes regular bowel movements. He has not seen any sign of hernia.  EXAM: HEENT: normocephalic; pupils equal and reactive; sclerae clear; dentition good; mucous membranes moist NECK:  symmetric on extension; no palpable anterior or posterior cervical lymphadenopathy; no supraclavicular masses; no tenderness CHEST: clear to auscultation bilaterally without rales, rhonchi, or wheezes ABDOMEN: Abdomen is soft without distention. Midline incision is well-healed. With Valsalva there is no sign of herniation. Palpation in the upper abdomen shows no palpable masses. There is no hepatosplenomegaly. There is no tenderness. CARDIAC: regular rate and rhythm without significant murmur; peripheral pulses are full EXT:  non-tender without edema; no deformity NEURO: no gross focal deficits; no sign of tremor   IMPRESSION: Colorectal carcinoma with two intra-abdominal metastases  PLAN: The patient and I had a lengthy discussion regarding all of the above findings. Certainly it is good news that he has had a significant response to chemotherapy and that  there have been no new lesions identified in the abdomen. Patient is scheduled to complete adjuvant chemotherapy in May 2013. He will have staging radiographic studies after that time. After these studies are complete, I would like to present his case at the gastroenterology oncology conference in late May. At this point we have not ruled out the possibility of additional surgery to resect the 2 remaining metastases. Patient my discuss this today at length and he is open to that idea. I will contact him in late May or early June after I have had a chance to discuss this further with my colleagues.  Velora Heckler, MD, FACS General & Endocrine Surgery [facility]

## 2012-03-19 ENCOUNTER — Other Ambulatory Visit (HOSPITAL_BASED_OUTPATIENT_CLINIC_OR_DEPARTMENT_OTHER): Payer: Medicare Other | Admitting: Lab

## 2012-03-19 ENCOUNTER — Ambulatory Visit (HOSPITAL_BASED_OUTPATIENT_CLINIC_OR_DEPARTMENT_OTHER): Payer: Medicare Other

## 2012-03-19 VITALS — BP 107/69 | HR 93 | Temp 97.1°F

## 2012-03-19 DIAGNOSIS — C801 Malignant (primary) neoplasm, unspecified: Secondary | ICD-10-CM

## 2012-03-19 DIAGNOSIS — Z5112 Encounter for antineoplastic immunotherapy: Secondary | ICD-10-CM

## 2012-03-19 DIAGNOSIS — C189 Malignant neoplasm of colon, unspecified: Secondary | ICD-10-CM

## 2012-03-19 DIAGNOSIS — Z5111 Encounter for antineoplastic chemotherapy: Secondary | ICD-10-CM

## 2012-03-19 LAB — COMPREHENSIVE METABOLIC PANEL
ALT: 19 U/L (ref 0–53)
CO2: 26 mEq/L (ref 19–32)
Calcium: 9.9 mg/dL (ref 8.4–10.5)
Chloride: 110 mEq/L (ref 96–112)
Glucose, Bld: 137 mg/dL — ABNORMAL HIGH (ref 70–99)
Sodium: 143 mEq/L (ref 135–145)
Total Bilirubin: 1.1 mg/dL (ref 0.3–1.2)
Total Protein: 6.4 g/dL (ref 6.0–8.3)

## 2012-03-19 LAB — CBC WITH DIFFERENTIAL/PLATELET
Basophils Absolute: 0 10*3/uL (ref 0.0–0.1)
Eosinophils Absolute: 0.3 10*3/uL (ref 0.0–0.5)
HGB: 13.9 g/dL (ref 13.0–17.1)
LYMPH%: 16 % (ref 14.0–49.0)
MCV: 94.2 fL (ref 79.3–98.0)
MONO#: 0.6 10*3/uL (ref 0.1–0.9)
MONO%: 6.8 % (ref 0.0–14.0)
NEUT#: 6 10*3/uL (ref 1.5–6.5)
Platelets: 112 10*3/uL — ABNORMAL LOW (ref 140–400)
RBC: 4.14 10*6/uL — ABNORMAL LOW (ref 4.20–5.82)
RDW: 17 % — ABNORMAL HIGH (ref 11.0–14.6)
WBC: 8.2 10*3/uL (ref 4.0–10.3)
nRBC: 0 % (ref 0–0)

## 2012-03-19 MED ORDER — SODIUM CHLORIDE 0.9 % IV SOLN
5.0000 mg/kg | Freq: Once | INTRAVENOUS | Status: AC
  Administered 2012-03-19: 425 mg via INTRAVENOUS
  Filled 2012-03-19: qty 17

## 2012-03-19 MED ORDER — OXALIPLATIN CHEMO INJECTION 100 MG/20ML
85.0000 mg/m2 | Freq: Once | INTRAVENOUS | Status: AC
  Administered 2012-03-19: 170 mg via INTRAVENOUS
  Filled 2012-03-19: qty 34

## 2012-03-19 MED ORDER — DEXTROSE 5 % IV SOLN: Freq: Once | INTRAVENOUS | Status: DC

## 2012-03-19 MED ORDER — DEXAMETHASONE SODIUM PHOSPHATE 10 MG/ML IJ SOLN
10.0000 mg | Freq: Once | INTRAMUSCULAR | Status: AC
  Administered 2012-03-19: 10 mg via INTRAVENOUS

## 2012-03-19 MED ORDER — ONDANSETRON 8 MG/50ML IVPB (CHCC)
8.0000 mg | Freq: Once | INTRAVENOUS | Status: AC
  Administered 2012-03-19: 8 mg via INTRAVENOUS

## 2012-03-19 NOTE — Patient Instructions (Signed)
[facility] Discharge Instructions for Patients Receiving Chemotherapy  Today you received the following chemotherapy agents oxacilaplatin/ avastin  To help prevent nausea and vomiting after your treatment, we encourage you to take your nausea medication * and take it as often as prescribed for the next  If you develop nausea and vomiting that is not controlled by your nausea medication, call the clinic. If it is after clinic hours your family physician or the after hours number for the clinic or go to the Emergency Department.   BELOW ARE SYMPTOMS THAT SHOULD BE REPORTED IMMEDIATELY:  *FEVER GREATER THAN 100.5 F  *CHILLS WITH OR WITHOUT FEVER  NAUSEA AND VOMITING THAT IS NOT CONTROLLED WITH YOUR NAUSEA MEDICATION  *UNUSUAL SHORTNESS OF BREATH  *UNUSUAL BRUISING OR BLEEDING  TENDERNESS IN MOUTH AND THROAT WITH OR WITHOUT PRESENCE OF ULCERS  *URINARY PROBLEMS  *BOWEL PROBLEMS  UNUSUAL RASH Items with * indicate a potential emergency and should be followed up as soon as possible.  One of the nurses will contact you 24 hours after your treatment. Please let the nurse know about any problems that you may have experienced. Feel free to call the clinic you have any questions or concerns. The clinic phone number is [phone].   I have been informed and understand all the instructions given to me. I know to contact the clinic, my physician, or go to the Emergency Department if any problems should occur. I do not have any questions at this time, but understand that I may call the clinic during office hours   should I have any questions or need assistance in obtaining follow up care.    __________________________________________  _____________  __________ Signature of Patient or Authorized Representative            Date                   Time    __________________________________________ Nurse's Signature

## 2012-03-20 ENCOUNTER — Telehealth: Payer: Self-pay | Admitting: Oncology

## 2012-03-20 NOTE — Telephone Encounter (Signed)
Pt called today to see if appt that was moved from 4/10 to 4/11 could be moved back to 4/10 as he cannot rearrange his schedule on 4/11 as he thought he would and will come 4/10 in the AM if he has to. Marcelino Duster has changed appt and I added lb and gv pt next appt for 4/10 @ 12:30 pm. Also added appts for 5/29 and 6/12. Pt to get schedule when he comes in.

## 2012-03-25 ENCOUNTER — Encounter: Payer: Self-pay | Admitting: Oncology

## 2012-03-25 NOTE — Progress Notes (Signed)
Patient stop by today,because he had questions about his bills and needed copies of his EOB to mail to cancer care co-pay [employer],so we talk with Synetta Fail at [facility] and she said she would mail them out to the patient this week,and then he would bring them to me to fax or mail.

## 2012-04-02 ENCOUNTER — Ambulatory Visit (HOSPITAL_BASED_OUTPATIENT_CLINIC_OR_DEPARTMENT_OTHER): Payer: Medicare Other

## 2012-04-02 ENCOUNTER — Other Ambulatory Visit (HOSPITAL_BASED_OUTPATIENT_CLINIC_OR_DEPARTMENT_OTHER): Payer: Medicare Other | Admitting: Lab

## 2012-04-02 VITALS — BP 113/69 | HR 80 | Temp 97.7°F

## 2012-04-02 DIAGNOSIS — C189 Malignant neoplasm of colon, unspecified: Secondary | ICD-10-CM

## 2012-04-02 DIAGNOSIS — C3410 Malignant neoplasm of upper lobe, unspecified bronchus or lung: Secondary | ICD-10-CM

## 2012-04-02 DIAGNOSIS — C185 Malignant neoplasm of splenic flexure: Secondary | ICD-10-CM

## 2012-04-02 DIAGNOSIS — Z5112 Encounter for antineoplastic immunotherapy: Secondary | ICD-10-CM

## 2012-04-02 DIAGNOSIS — Z85038 Personal history of other malignant neoplasm of large intestine: Secondary | ICD-10-CM

## 2012-04-02 DIAGNOSIS — Z5111 Encounter for antineoplastic chemotherapy: Secondary | ICD-10-CM

## 2012-04-02 DIAGNOSIS — C342 Malignant neoplasm of middle lobe, bronchus or lung: Secondary | ICD-10-CM

## 2012-04-02 LAB — CBC WITH DIFFERENTIAL/PLATELET
BASO%: 0.2 % (ref 0.0–2.0)
EOS%: 4.3 % (ref 0.0–7.0)
MCH: 33.4 pg (ref 27.2–33.4)
MCHC: 35.4 g/dL (ref 32.0–36.0)
NEUT%: 72.6 % (ref 39.0–75.0)
RDW: 16.7 % — ABNORMAL HIGH (ref 11.0–14.6)
lymph#: 1.1 10*3/uL (ref 0.9–3.3)

## 2012-04-02 MED ORDER — DEXTROSE 5 % IV SOLN
Freq: Once | INTRAVENOUS | Status: AC
  Administered 2012-04-02: 14:00:00 via INTRAVENOUS

## 2012-04-02 MED ORDER — SODIUM CHLORIDE 0.9 % IV SOLN
5.0000 mg/kg | Freq: Once | INTRAVENOUS | Status: AC
  Administered 2012-04-02: 425 mg via INTRAVENOUS
  Filled 2012-04-02: qty 17

## 2012-04-02 MED ORDER — SODIUM CHLORIDE 0.9 % IV SOLN
Freq: Once | INTRAVENOUS | Status: AC
  Administered 2012-04-02: 13:00:00 via INTRAVENOUS

## 2012-04-02 MED ORDER — ONDANSETRON 8 MG/50ML IVPB (CHCC)
8.0000 mg | Freq: Once | INTRAVENOUS | Status: AC
  Administered 2012-04-02: 8 mg via INTRAVENOUS

## 2012-04-02 MED ORDER — DEXAMETHASONE SODIUM PHOSPHATE 10 MG/ML IJ SOLN
10.0000 mg | Freq: Once | INTRAMUSCULAR | Status: AC
  Administered 2012-04-02: 10 mg via INTRAVENOUS

## 2012-04-02 MED ORDER — OXALIPLATIN CHEMO INJECTION 100 MG/20ML
85.0000 mg/m2 | Freq: Once | INTRAVENOUS | Status: AC
  Administered 2012-04-02: 170 mg via INTRAVENOUS
  Filled 2012-04-02: qty 34

## 2012-04-02 NOTE — Patient Instructions (Signed)
Patient aware of next appointment; discharged home with no complaints. 

## 2012-04-03 ENCOUNTER — Telehealth: Payer: Self-pay | Admitting: *Deleted

## 2012-04-03 ENCOUNTER — Ambulatory Visit: Payer: Medicare Other

## 2012-04-03 LAB — COMPREHENSIVE METABOLIC PANEL
AST: 23 U/L (ref 0–37)
Albumin: 4.2 g/dL (ref 3.5–5.2)
Alkaline Phosphatase: 39 U/L (ref 39–117)
BUN: 17 mg/dL (ref 6–23)
Potassium: 4.2 mEq/L (ref 3.5–5.3)
Total Bilirubin: 1 mg/dL (ref 0.3–1.2)

## 2012-04-03 NOTE — Telephone Encounter (Signed)
Pt notified of CEA result per Dr. Cyndie Chime.

## 2012-04-03 NOTE — Telephone Encounter (Signed)
Message copied by Sabino Snipes on Thu Apr 03, 2012 10:48 AM ------      Message from: Levert Feinstein      Created: Thu Apr 03, 2012  9:58 AM       Call pt CEA now normal

## 2012-04-07 ENCOUNTER — Other Ambulatory Visit: Payer: Self-pay | Admitting: Certified Registered Nurse Anesthetist

## 2012-04-11 ENCOUNTER — Encounter: Payer: Self-pay | Admitting: *Deleted

## 2012-04-11 NOTE — Progress Notes (Signed)
RECEIVED A FAX FROM [employer] CONCERNING A CONFIRMATION OF PRESCRIPTION SHIPMENT FOR XELODA. 

## 2012-04-16 ENCOUNTER — Encounter: Payer: Self-pay | Admitting: Oncology

## 2012-04-16 NOTE — Progress Notes (Signed)
Fax patient EOB to cancer care fax# [phone].

## 2012-04-23 ENCOUNTER — Ambulatory Visit (HOSPITAL_BASED_OUTPATIENT_CLINIC_OR_DEPARTMENT_OTHER): Payer: Medicare Other

## 2012-04-23 ENCOUNTER — Other Ambulatory Visit: Payer: Self-pay | Admitting: Oncology

## 2012-04-23 ENCOUNTER — Other Ambulatory Visit (HOSPITAL_BASED_OUTPATIENT_CLINIC_OR_DEPARTMENT_OTHER): Payer: Medicare Other

## 2012-04-23 VITALS — BP 118/65 | HR 89 | Temp 97.0°F

## 2012-04-23 DIAGNOSIS — Z5111 Encounter for antineoplastic chemotherapy: Secondary | ICD-10-CM

## 2012-04-23 DIAGNOSIS — C3410 Malignant neoplasm of upper lobe, unspecified bronchus or lung: Secondary | ICD-10-CM

## 2012-04-23 DIAGNOSIS — C189 Malignant neoplasm of colon, unspecified: Secondary | ICD-10-CM

## 2012-04-23 DIAGNOSIS — C185 Malignant neoplasm of splenic flexure: Secondary | ICD-10-CM

## 2012-04-23 LAB — CBC WITH DIFFERENTIAL/PLATELET
Eosinophils Absolute: 0.3 10*3/uL (ref 0.0–0.5)
HCT: 36.2 % — ABNORMAL LOW (ref 38.4–49.9)
LYMPH%: 18.1 % (ref 14.0–49.0)
MONO#: 0.4 10*3/uL (ref 0.1–0.9)
NEUT#: 4.7 10*3/uL (ref 1.5–6.5)
NEUT%: 70.9 % (ref 39.0–75.0)
Platelets: 149 10*3/uL (ref 140–400)
WBC: 6.6 10*3/uL (ref 4.0–10.3)
lymph#: 1.2 10*3/uL (ref 0.9–3.3)
nRBC: 0 % (ref 0–0)

## 2012-04-23 LAB — COMPREHENSIVE METABOLIC PANEL
ALT: 14 U/L (ref 0–53)
AST: 22 U/L (ref 0–37)
Albumin: 4.1 g/dL (ref 3.5–5.2)
CO2: 25 mEq/L (ref 19–32)
Calcium: 9.9 mg/dL (ref 8.4–10.5)
Chloride: 107 mEq/L (ref 96–112)
Creatinine, Ser: 1.09 mg/dL (ref 0.50–1.35)
Potassium: 4.2 mEq/L (ref 3.5–5.3)

## 2012-04-23 MED ORDER — SODIUM CHLORIDE 0.9 % IV SOLN
5.0000 mg/kg | Freq: Once | INTRAVENOUS | Status: AC
  Administered 2012-04-23: 425 mg via INTRAVENOUS
  Filled 2012-04-23: qty 17

## 2012-04-23 MED ORDER — ONDANSETRON 8 MG/50ML IVPB (CHCC)
8.0000 mg | Freq: Once | INTRAVENOUS | Status: AC
  Administered 2012-04-23: 8 mg via INTRAVENOUS

## 2012-04-23 MED ORDER — SODIUM CHLORIDE 0.9 % IV SOLN
Freq: Once | INTRAVENOUS | Status: AC
  Administered 2012-04-23: 18:00:00 via INTRAVENOUS

## 2012-04-23 MED ORDER — DEXTROSE 5 % IV SOLN
Freq: Once | INTRAVENOUS | Status: AC
  Administered 2012-04-23: 15:00:00 via INTRAVENOUS

## 2012-04-23 MED ORDER — OXALIPLATIN CHEMO INJECTION 100 MG/20ML
85.0000 mg/m2 | Freq: Once | INTRAVENOUS | Status: AC
  Administered 2012-04-23: 170 mg via INTRAVENOUS
  Filled 2012-04-23: qty 34

## 2012-04-23 MED ORDER — DEXAMETHASONE SODIUM PHOSPHATE 10 MG/ML IJ SOLN
10.0000 mg | Freq: Once | INTRAMUSCULAR | Status: AC
  Administered 2012-04-23: 10 mg via INTRAVENOUS

## 2012-04-23 NOTE — Progress Notes (Signed)
oxali began in IV on rt arm, then switched to IV in lt arm per patient request. dph

## 2012-04-23 NOTE — Patient Instructions (Signed)
[facility] Discharge Instructions for Patients Receiving Chemotherapy  Today you received the following chemotherapy agents; Oxaliplatin and Avastin. To help prevent nausea and vomiting after your treatment, we encourage you to take your nausea medication. Begin taking it as often as prescribed.   If you develop nausea and vomiting that is not controlled by your nausea medication, call the clinic  [phone]. If it is after clinic hours your family physician or the after hours number for the clinic or go to the Emergency Department.   BELOW ARE SYMPTOMS THAT SHOULD BE REPORTED IMMEDIATELY:  *FEVER GREATER THAN 100.5 F  *CHILLS WITH OR WITHOUT FEVER  NAUSEA AND VOMITING THAT IS NOT CONTROLLED WITH YOUR NAUSEA MEDICATION  *UNUSUAL SHORTNESS OF BREATH  *UNUSUAL BRUISING OR BLEEDING  TENDERNESS IN MOUTH AND THROAT WITH OR WITHOUT PRESENCE OF ULCERS  *URINARY PROBLEMS  *BOWEL PROBLEMS  UNUSUAL RASH Items with * indicate a potential emergency and should be followed up as soon as possible.  One of the nurses will contact you 24 hours after your treatment. Please let the nurse know about any problems that you may have experienced. Feel free to call the clinic you have any questions or concerns. The clinic phone number is [phone].   I have been informed and understand all the instructions given to me. I know to contact the clinic, my physician, or go to the Emergency Department if any problems should occur. I do not have any questions at this time, but understand that I may call the clinic during office hours   should I have any questions or need assistance in obtaining follow up care.    __________________________________________  _____________  __________ Signature of Patient or Authorized Representative            Date                   Time    __________________________________________ Nurse's Signature

## 2012-05-05 ENCOUNTER — Other Ambulatory Visit: Payer: Self-pay

## 2012-05-05 ENCOUNTER — Telehealth: Payer: Self-pay

## 2012-05-05 NOTE — Telephone Encounter (Signed)
Received call from pt reporting neuropathy to bilateral fingertips and feet.  Pt reports sx started suddenly "about 4 days after my last treatment on 5/1."  Pt reports it is difficult to button his shirt, but "not impossible."  Was on vacation last week and states "when I was fishing it was difficult to bait the hook, but not impossible."  Pt is able to hold a pen and write without problem, but states "typing is aggravating."  Reports numbness to bottoms of both feet, but denies tripping or difficulty walking.  Denies pain.  Sx do NOT seem to be decreasing with time. Pt dnies having neuropathy with any past tx.    Pt is scheduled for 12th Oxaliplatin tx on Wednesday 5/15.  Questions if he should keep this appt or if dose needs to be changed.  Also questions if there is "anything I can do about this substantial numbness." Note to Dr Cyndie Chime

## 2012-05-05 NOTE — Telephone Encounter (Signed)
Pt notified per Dr Cyndie Chime - Oxaliplatin will be held at appt on 5/15.  Pt will still get Avastin.   Pt is aware and verbalizes understanding to keep infusion appt on Wednesday, but that he will only receive Avastin.  Herbert Seta [facility] pharmacist notified. dph

## 2012-05-07 ENCOUNTER — Other Ambulatory Visit (HOSPITAL_BASED_OUTPATIENT_CLINIC_OR_DEPARTMENT_OTHER): Payer: Medicare Other | Admitting: Lab

## 2012-05-07 ENCOUNTER — Other Ambulatory Visit: Payer: Self-pay | Admitting: Oncology

## 2012-05-07 ENCOUNTER — Ambulatory Visit (HOSPITAL_BASED_OUTPATIENT_CLINIC_OR_DEPARTMENT_OTHER): Payer: Medicare Other

## 2012-05-07 ENCOUNTER — Other Ambulatory Visit: Payer: Self-pay

## 2012-05-07 VITALS — BP 102/63 | HR 82 | Temp 98.7°F

## 2012-05-07 DIAGNOSIS — C3410 Malignant neoplasm of upper lobe, unspecified bronchus or lung: Secondary | ICD-10-CM

## 2012-05-07 DIAGNOSIS — C189 Malignant neoplasm of colon, unspecified: Secondary | ICD-10-CM

## 2012-05-07 DIAGNOSIS — Z5112 Encounter for antineoplastic immunotherapy: Secondary | ICD-10-CM

## 2012-05-07 DIAGNOSIS — C185 Malignant neoplasm of splenic flexure: Secondary | ICD-10-CM

## 2012-05-07 LAB — CBC WITH DIFFERENTIAL/PLATELET
Eosinophils Absolute: 0.6 10*3/uL — ABNORMAL HIGH (ref 0.0–0.5)
HCT: 38.3 % — ABNORMAL LOW (ref 38.4–49.9)
HGB: 13.4 g/dL (ref 13.0–17.1)
LYMPH%: 15.6 % (ref 14.0–49.0)
MONO#: 0.6 10*3/uL (ref 0.1–0.9)
NEUT#: 6.1 10*3/uL (ref 1.5–6.5)
Platelets: 161 10*3/uL (ref 140–400)
RBC: 4.05 10*6/uL — ABNORMAL LOW (ref 4.20–5.82)
WBC: 8.7 10*3/uL (ref 4.0–10.3)
nRBC: 0 % (ref 0–0)

## 2012-05-07 MED ORDER — SODIUM CHLORIDE 0.9 % IV SOLN
Freq: Once | INTRAVENOUS | Status: AC
  Administered 2012-05-07: 14:00:00 via INTRAVENOUS

## 2012-05-07 MED ORDER — SODIUM CHLORIDE 0.9 % IV SOLN
5.0000 mg/kg | Freq: Once | INTRAVENOUS | Status: AC
  Administered 2012-05-07: 425 mg via INTRAVENOUS
  Filled 2012-05-07: qty 17

## 2012-05-07 NOTE — Patient Instructions (Signed)
[facility] Discharge Instructions for Patients Receiving Chemotherapy  Today you received the following chemotherapy agents Avastin To help prevent nausea and vomiting after your treatment, we encourage you to take your nausea medication as prescribed.  If you develop nausea and vomiting that is not controlled by your nausea medication, call the clinic. If it is after clinic hours your family physician or the after hours number for the clinic or go to the Emergency Department.   BELOW ARE SYMPTOMS THAT SHOULD BE REPORTED IMMEDIATELY:  *FEVER GREATER THAN 100.5 F  *CHILLS WITH OR WITHOUT FEVER  NAUSEA AND VOMITING THAT IS NOT CONTROLLED WITH YOUR NAUSEA MEDICATION  *UNUSUAL SHORTNESS OF BREATH  *UNUSUAL BRUISING OR BLEEDING  TENDERNESS IN MOUTH AND THROAT WITH OR WITHOUT PRESENCE OF ULCERS  *URINARY PROBLEMS  *BOWEL PROBLEMS  UNUSUAL RASH Items with * indicate a potential emergency and should be followed up as soon as possible.  One of the nurses will contact you 24 hours after your treatment. Please let the nurse know about any problems that you may have experienced. Feel free to call the clinic you have any questions or concerns. The clinic phone number is [phone].   I have been informed and understand all the instructions given to me. I know to contact the clinic, my physician, or go to the Emergency Department if any problems should occur. I do not have any questions at this time, but understand that I may call the clinic during office hours   should I have any questions or need assistance in obtaining follow up care.    __________________________________________  _____________  __________ Signature of Patient or Authorized Representative            Date                   Time    __________________________________________ Nurse's Signature    

## 2012-05-13 ENCOUNTER — Ambulatory Visit (HOSPITAL_COMMUNITY)
Admission: RE | Admit: 2012-05-13 | Discharge: 2012-05-13 | Disposition: A | Discharge status: Home / Self Care | Payer: Medicare Other | Source: Ambulatory Visit | Attending: Oncology | Admitting: Oncology

## 2012-05-13 DIAGNOSIS — C342 Malignant neoplasm of middle lobe, bronchus or lung: Secondary | ICD-10-CM | POA: Insufficient documentation

## 2012-05-13 DIAGNOSIS — R1909 Other intra-abdominal and pelvic swelling, mass and lump: Secondary | ICD-10-CM | POA: Insufficient documentation

## 2012-05-13 DIAGNOSIS — Z85038 Personal history of other malignant neoplasm of large intestine: Secondary | ICD-10-CM

## 2012-05-13 DIAGNOSIS — C3410 Malignant neoplasm of upper lobe, unspecified bronchus or lung: Secondary | ICD-10-CM

## 2012-05-13 DIAGNOSIS — C189 Malignant neoplasm of colon, unspecified: Secondary | ICD-10-CM | POA: Insufficient documentation

## 2012-05-13 MED ORDER — IOHEXOL 300 MG/ML  SOLN
100.0000 mL | Freq: Once | INTRAMUSCULAR | Status: AC | PRN
  Administered 2012-05-13: 100 mL via INTRAVENOUS

## 2012-05-15 ENCOUNTER — Other Ambulatory Visit: Payer: Self-pay | Admitting: Oncology

## 2012-05-16 ENCOUNTER — Ambulatory Visit (HOSPITAL_BASED_OUTPATIENT_CLINIC_OR_DEPARTMENT_OTHER): Payer: Medicare Other | Admitting: Oncology

## 2012-05-16 ENCOUNTER — Encounter: Payer: Self-pay | Admitting: Oncology

## 2012-05-16 ENCOUNTER — Telehealth: Payer: Self-pay | Admitting: Oncology

## 2012-05-16 VITALS — BP 121/70 | HR 89 | Temp 96.7°F | Ht 67.0 in | Wt 167.9 lb

## 2012-05-16 DIAGNOSIS — C3410 Malignant neoplasm of upper lobe, unspecified bronchus or lung: Secondary | ICD-10-CM

## 2012-05-16 DIAGNOSIS — C787 Secondary malignant neoplasm of liver and intrahepatic bile duct: Secondary | ICD-10-CM

## 2012-05-16 DIAGNOSIS — G620 Drug-induced polyneuropathy: Secondary | ICD-10-CM

## 2012-05-16 DIAGNOSIS — T451X5A Adverse effect of antineoplastic and immunosuppressive drugs, initial encounter: Secondary | ICD-10-CM | POA: Insufficient documentation

## 2012-05-16 DIAGNOSIS — C189 Malignant neoplasm of colon, unspecified: Secondary | ICD-10-CM

## 2012-05-16 HISTORY — DX: Drug-induced polyneuropathy: G62.0

## 2012-05-16 NOTE — Telephone Encounter (Signed)
gv pt appt for [id number] 

## 2012-05-16 NOTE — Progress Notes (Signed)
Hematology and Oncology Follow Up Visit  [name] [id number] [birth year] 71 y.o. 05/16/2012 3:30 PM   Principle Diagnosis: Encounter Diagnoses  Name Primary?  . Colon cancer metastasized to liver Yes  . Lung cancer, right   . Cancer of lung, upper lobe, left   . Neuropathy due to chemotherapeutic drug      Interim History:   A followup visit for this 71 year old man with local intra-abdominal recurrence of colon cancer initial stage III  diagnosed in in July 2012 status post left colectomy 07/09/2011. He developed a rise in CEA tumor marker and 2 intra-abdominal soft tissue densities on followup CT scan done in November 2012 while on adjuvant Xeloda chemotherapy. Oxaliplatin and Avastin were added to his regimen. Oxaliplatinum was started 11/26/2011. He has had a study fall in his CEA tumor marker from peak value of 12.6 on 11/02/2011 2 most recent value of 2.3 on 05/07/2012. Unfortunately, he has developed cumulative neurotoxicity from the Oxaliplatinum. We were following his symptoms closely but recently he had a sudden worsening of neuropathy, hands greater than feet, and was having trouble buttoning buttons and uncomfortable dysesthesias. I had to stop the Oxaliplatinum.  CT scans done in anticipation of today's visit on 5/21 which I personally reviewed, continues to show multiple tiny up to 5 mm peritoneal nodules. I don't think the extent of these nodules was ever reported on previous scans but I went back and looked at the study done in October at time of peritoneal nodules were first mentioned. At that time only 2 soft tissue nodules weren't reported but there  clearly were more small lesions which the radiologist did not mention. On the current study, none of these lesions is  larger and the majority are smaller. No new obvious areas of abnormality.  Of interest is the fact that since we added  Avastin to his regimen, the right lower lobe lung lesion has decreased in size and is  barely obvious on the current study. This represents a second primary lung cancer unrelated to his colon cancer.  He has lost considerable strength and weight. I attribute this primarily to the addition of the Oxaliplatinum. He has no obvious side effects from the Avastin. He denies any mouth sores, no diarrhea.   Medications: reviewed  Allergies: No Known Allergies  Review of Systems: Constitutional:  Progressive fatigue and weight loss  Respiratory: No cough or dyspnea Cardiovascular:  No chest pain or palpitations Gastrointestinal: No abdominal pain no change in bowel habit Genito-Urinary: No urinary tract symptoms Musculoskeletal: No bone pain Neurologic: See above Skin: No rash Remaining ROS negative.  Physical Exam: Blood pressure 121/70, pulse 89, temperature 96.7 F (35.9 C), temperature source Oral, height 5\' 7"  (1.702 m), weight 167 lb 14.4 oz (76.159 kg). Wt Readings from Last 3 Encounters:  05/16/12 167 lb 14.4 oz (76.159 kg)  03/18/12 171 lb 6.4 oz (77.747 kg)  03/14/12 170 lb 11.2 oz (77.429 kg)     General appearance: He is now starting to appear chronically ill HENNT: Pharynx no erythema exudate or ulcer Lymph nodes: No adenopathy Breasts: Lungs: Clear to auscultation resonant to percussion Heart: Regular rhythm no gallop no murmur Abdomen: Soft nontender no mass no organomegaly Extremities: No edema no calf tenderness Vascular: No cyanosis Neurologic: Moderate to severe decrease in vibration sensation over the fingertips by tuning fork exam; motor strength remains 5 over 5 all extremities Skin: No rash or ecchymosis  Lab Results: Lab Results  Component Value Date  WBC 8.7 05/07/2012   HGB 13.4 05/07/2012   HCT 38.3* 05/07/2012   MCV 94.6 05/07/2012   PLT 161 05/07/2012     Chemistry      Component Value Date/Time   NA 140 04/23/2012 1308   NA 139 10/15/2011 1601   K 4.2 04/23/2012 1308   K 4.3 10/15/2011 1601   CL 107 04/23/2012 1308   CL 101  10/15/2011 1601   CO2 25 04/23/2012 1308   CO2 30 10/15/2011 1601   BUN 15 04/23/2012 1308   BUN 17 10/15/2011 1601   CREATININE 1.09 04/23/2012 1308   CREATININE 1.1 10/15/2011 1601      Component Value Date/Time   CALCIUM 9.9 04/23/2012 1308   CALCIUM 9.3 10/15/2011 1601   ALKPHOS 51 04/23/2012 1308   ALKPHOS 59 10/15/2011 1601   AST 22 04/23/2012 1308   AST 35 10/15/2011 1601   ALT 14 04/23/2012 1308   BILITOT 0.8 04/23/2012 1308   BILITOT 1.30 10/15/2011 1601       Radiological Studies: See discussion above    Impression and Plan: #1. 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 Laboratory and radiographic evidence that we have disease under control at present with normalization of CEA and decreased prominence of multiple small peritoneal nodules. We have had to stop his Oxaliplatinum due to neuropathy. Due to overall decline in his performance status concomitant with the development of neuropathy, I'm going to continue Xeloda plus A. Avastin but not add  Irenotecan. I will reserve this drug for the future if disease progresses.  #2. Oxaliplatinum induced neuropathy. Hopefully this will improve over time. He is not having any pain at present so I will not put him on a lyrica.  #3. 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 We have been following a right lower lobe lesion now for a few years and it appears to be shrinking with addition of Avastin to his colon cancer regimen.  #4. Type 2 diabetes  #5. History of nephrolithiasis.  #6. Status post bilateral corneal transplants.   CC:. Dr. Serita Grit; Dr. [name];   Levert Feinstein, MD 5/24/20133:30 PM

## 2012-05-21 ENCOUNTER — Telehealth: Payer: Self-pay | Admitting: *Deleted

## 2012-05-21 ENCOUNTER — Other Ambulatory Visit (HOSPITAL_BASED_OUTPATIENT_CLINIC_OR_DEPARTMENT_OTHER): Payer: Medicare Other | Admitting: Lab

## 2012-05-21 ENCOUNTER — Ambulatory Visit (HOSPITAL_BASED_OUTPATIENT_CLINIC_OR_DEPARTMENT_OTHER): Payer: Medicare Other

## 2012-05-21 VITALS — BP 118/67 | HR 83 | Temp 97.7°F

## 2012-05-21 DIAGNOSIS — Z5112 Encounter for antineoplastic immunotherapy: Secondary | ICD-10-CM

## 2012-05-21 DIAGNOSIS — C801 Malignant (primary) neoplasm, unspecified: Secondary | ICD-10-CM

## 2012-05-21 DIAGNOSIS — C189 Malignant neoplasm of colon, unspecified: Secondary | ICD-10-CM

## 2012-05-21 LAB — CBC WITH DIFFERENTIAL/PLATELET
Basophils Absolute: 0.1 10*3/uL (ref 0.0–0.1)
Eosinophils Absolute: 0.3 10*3/uL (ref 0.0–0.5)
HCT: 35.9 % — ABNORMAL LOW (ref 38.4–49.9)
HGB: 12.5 g/dL — ABNORMAL LOW (ref 13.0–17.1)
LYMPH%: 38.1 % (ref 14.0–49.0)
MCV: 94.5 fL (ref 79.3–98.0)
MONO%: 8.8 % (ref 0.0–14.0)
NEUT#: 3 10*3/uL (ref 1.5–6.5)
NEUT%: 47.3 % (ref 39.0–75.0)
Platelets: 106 10*3/uL — ABNORMAL LOW (ref 140–400)
RBC: 3.8 10*6/uL — ABNORMAL LOW (ref 4.20–5.82)

## 2012-05-21 LAB — COMPREHENSIVE METABOLIC PANEL
Alkaline Phosphatase: 57 U/L (ref 39–117)
BUN: 16 mg/dL (ref 6–23)
CO2: 24 mEq/L (ref 19–32)
Creatinine, Ser: 0.91 mg/dL (ref 0.50–1.35)
Glucose, Bld: 129 mg/dL — ABNORMAL HIGH (ref 70–99)
Total Bilirubin: 1.3 mg/dL — ABNORMAL HIGH (ref 0.3–1.2)

## 2012-05-21 MED ORDER — SODIUM CHLORIDE 0.9 % IV SOLN
400.0000 mg | Freq: Once | INTRAVENOUS | Status: AC
  Administered 2012-05-21: 400 mg via INTRAVENOUS
  Filled 2012-05-21: qty 16

## 2012-05-21 MED ORDER — SODIUM CHLORIDE 0.9 % IV SOLN
Freq: Once | INTRAVENOUS | Status: AC
  Administered 2012-05-21: 14:00:00 via INTRAVENOUS

## 2012-05-21 NOTE — Telephone Encounter (Signed)
[employer] faxed confirmation of Xeloda shipment.  Shipped Xeloda on 05-20-12 with next business day delivery.

## 2012-05-22 ENCOUNTER — Telehealth: Payer: Self-pay

## 2012-05-22 NOTE — Telephone Encounter (Signed)
Called and spoke with [name], [facility] for Dr Waynard Edwards. She states that Mr. [name] needs to re evaluated for his COPD asap. He has seen RB in the past, but not since 07' so will be new consult. He is having increased DOE. Pt's schedule only allows him to come in for appts on Wed am only due to chemo therapy. She would like to have him seen next wk if possible. No opening with any provider. VS, you are in the office on next Wed 05/29/12 am- would you be willing to [facility] your schedule to see this pt? Please advise, thanks!

## 2012-05-22 NOTE — Telephone Encounter (Signed)
That is okay with me 

## 2012-05-22 NOTE — Telephone Encounter (Signed)
Called, spoke with Malachi Bonds with Dr. Laurey Morale office.  Pt scheduled to see Dr. Craige Cotta on May 28, 2012 at 9 am.  Pt to arrive at 8:45 am and bring all medications with him to visit.  Malachi Bonds will inform pt and fax records to [phone].

## 2012-05-26 ENCOUNTER — Other Ambulatory Visit: Payer: Self-pay | Admitting: Internal Medicine

## 2012-05-26 DIAGNOSIS — K831 Obstruction of bile duct: Secondary | ICD-10-CM

## 2012-05-28 ENCOUNTER — Encounter: Payer: Self-pay | Admitting: Pulmonary Disease

## 2012-05-28 ENCOUNTER — Telehealth: Payer: Self-pay | Admitting: Pulmonary Disease

## 2012-05-28 ENCOUNTER — Ambulatory Visit (INDEPENDENT_AMBULATORY_CARE_PROVIDER_SITE_OTHER): Payer: Medicare Other | Admitting: Pulmonary Disease

## 2012-05-28 VITALS — BP 120/68 | HR 88 | Temp 97.4°F | Ht 67.0 in | Wt 165.2 lb

## 2012-05-28 DIAGNOSIS — J439 Emphysema, unspecified: Secondary | ICD-10-CM

## 2012-05-28 DIAGNOSIS — R0600 Dyspnea, unspecified: Secondary | ICD-10-CM | POA: Insufficient documentation

## 2012-05-28 DIAGNOSIS — C3490 Malignant neoplasm of unspecified part of unspecified bronchus or lung: Secondary | ICD-10-CM

## 2012-05-28 DIAGNOSIS — R0902 Hypoxemia: Secondary | ICD-10-CM

## 2012-05-28 DIAGNOSIS — R0989 Other specified symptoms and signs involving the circulatory and respiratory systems: Secondary | ICD-10-CM

## 2012-05-28 DIAGNOSIS — J438 Other emphysema: Secondary | ICD-10-CM

## 2012-05-28 MED ORDER — TIOTROPIUM BROMIDE MONOHYDRATE 18 MCG IN CAPS: 18.0000 ug | ORAL_CAPSULE | Freq: Every day | RESPIRATORY_TRACT | Status: DC

## 2012-05-28 NOTE — Assessment & Plan Note (Signed)
He has prior history of smoking.  PFT from 2007 showed mild obstruction with moderate diffusion defect.  He has progressive dyspnea and fatigue.  He reports have low oxygen with exertion.  He had ONO through PCP.  Will give therapeutic trial of spiriva.  He is up to date with his vaccinations.  Will repeat PFT.

## 2012-05-28 NOTE — Assessment & Plan Note (Signed)
He has borderline low oxygen level with exertion in office today (SpO2 88% with 3 laps).  He had recent ONO with PCP.  Will get copy of this, and then determine if he needs supplemental oxygen.

## 2012-05-28 NOTE — Assessment & Plan Note (Signed)
Followed by Dr. Cyndie Chime for hx of NSCLC and colon cancer.

## 2012-05-28 NOTE — Progress Notes (Signed)
Chief Complaint  Patient presents with  . Pulmonary Consult    Pt c/o increase SOB w/ exertion and fatigue--saw RB in 2000's    History of Present Illness: [name] is a 71 y.o. male former smoker for evaluation of COPD and progressive dyspnea.  Hx of NSCLC s/p Lt upper lobe wedge resection 2007, and second primary lung cancer Rt lower lobe 2012.  He was previously seen by Dr. Delton Coombes in 2007 prior to surgery of lung cancer.  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  He is currently under therapy for metastatic colon cancer with Dr. Cyndie Chime.  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  He has also been on avastin for second primary lung cancer in right lower lobe with good response on f/u CT chest.  He has noticed more trouble with his breathing over the past several weeks.  He now feels totally wiped out if he does any kind of activity.  He did not notice any improvement after his chemotherapy regimen was changed.  He can keep up on level ground, but going up stairs and lifting anything is a problem.  He denies much cough.  He is not having sputum, hemoptysis, chest tightness, or wheezing.  He develops idiopathic hives, but is not aware of any other allergies.  He denies leg swelling, or thrombo-embolic disease.  He has lost about 40 lbs since starting chemotherapy for his colon cancer.  He gets episodes of hoarseness after his chemotherapy sessions, but his usually gets better after a few weeks.  He was tried on spiriva in 2007, but did not feel like this helped.  He has not tried any other inhaler medicine.  He was told that his oxygen level may be low.  He had an overnight oximetry through his PCP earlier this week.  He has not heard the results of this yet.  In review of records from 2007, he was advised to start supplemental oxygen with exertion then, but declined this.   Past Medical History  Diagnosis  Date  . COPD (chronic obstructive pulmonary disease)   . Nephrolithiasis   . Diverticulosis   . Hx of adenomatous colonic polyps 04/30/07  . Glaucoma   . Hyperlipidemia   . ED (erectile dysfunction)   . Lung nodule   . Hepatic steatosis   . Thoracic spondylosis   . Non-small cell lung cancer   . SOB (shortness of breath)   . Diabetes mellitus     type II  . Bruises easily   . Anemia   . Hearing loss     slight  . Contact lens/glasses fitting   . Rectal bleeding   . Hemorrhoids   . Glaucoma   . 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 Colon cancer     Past Surgical History  Procedure Date  . 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 Lung removal, partial 05/2006  . Tonsillectomy   . Kidney stone removal 07/2006  . Colonoscopy   . Polypectomy   . Colon surgery 06/2011    Current Outpatient Prescriptions on File Prior to Visit  Medication Sig Dispense Refill  . Alum & Mag Hydroxide-Simeth (MAALOX ADVANCED PO) Take by mouth as needed.        Marland Kitchen aspirin 81 MG tablet Take 81 mg by mouth daily.        . Bevacizumab (AVASTIN IV)  Inject into the vein.      . capecitabine (XELODA) 500 MG tablet Take 4 tablets (2,000 mg total) by mouth 2 (two) times daily after a meal. 7 days on; 7 days off.  Fax to [facility]  112 tablet  6  . Cholecalciferol (VITAMIN D3) 2000 UNITS TABS Take by mouth daily.        . cyanocobalamin 500 MCG tablet Take 1,000 mcg by mouth daily. Also taking B12 injections through PCP office.      Marland Kitchen dextrose 5 % SOLN 500 mL with oxaliplatin 100 MG/20ML SOLN Inject into the vein once.      . ezetimibe-simvastatin (VYTORIN) 10-40 MG per tablet 1/2 tab 2-3 times per week      . loratadine (CLARITIN) 10 MG tablet Take 10 mg by mouth 2 (two) times daily.        Marland Kitchen loteprednol (LOTEMAX) 0.5 % ophthalmic suspension Place 1 drop into the left eye daily.        . metFORMIN (GLUMETZA) 1000 MG (MOD) 24 hr tablet Take 1,000 mg by mouth 2 (two) times daily  with a meal.        . Omega-3 Fatty Acids (FISH OIL) 1000 MG CAPS Take 6 capsules by mouth 2 (two) times daily.        . sitaGLIPtin (JANUVIA) 100 MG tablet Take 50 mg by mouth daily. Dose needs to be updated on next office visit.      Marland Kitchen timolol (BETIMOL) 0.25 % ophthalmic solution Place 1 drop into the left eye 2 (two) times daily.       Marland Kitchen tiotropium (SPIRIVA) 18 MCG inhalation capsule Place 1 capsule (18 mcg total) into inhaler and inhale daily.  30 capsule  6   Current Facility-Administered Medications on File Prior to Visit  Medication Dose Route Frequency Provider Last Rate Last Dose  . 0.9 %  sodium chloride infusion   Intravenous Once Levert Feinstein, MD      . dextrose 5 % solution   Intravenous Once Levert Feinstein, MD        No Known Allergies  Family History  Problem Relation Age of Onset  . Asthma      aunt  . Asthma Cousin   . Emphysema Father   . Leukemia Father   . Lung cancer Father   . Ulcers Brother     of the stomach  . Esophageal cancer Mother   . Cancer Paternal Aunt     breast  . Cancer Daughter     History  Substance Use Topics  . Smoking status: Former Smoker -- 2.0 packs/day for 25 years    Types: Cigarettes    Quit date: 03/13/1981  . Smokeless tobacco: Never Used  . Alcohol Use: 0.6 oz/week    1 Cans of beer per week     occasional beer   Review of Systems  Constitutional: Positive for appetite change, fatigue and unexpected weight change. Negative for fever.  HENT: Positive for congestion. Negative for ear pain, sore throat, sneezing, trouble swallowing and dental problem.   Respiratory: Positive for shortness of breath. Negative for cough.   Cardiovascular: Negative for chest pain, palpitations and leg swelling.  Gastrointestinal: Negative for anal bleeding.  Musculoskeletal: Negative for joint swelling.  Skin: Positive for rash.  Neurological: Negative for numbness.  Psychiatric/Behavioral: Negative for dysphoric mood. The patient  is not nervous/anxious.     Physical Exam: Filed Vitals:   05/28/12 0912 05/28/12 0913  BP:  120/68  Pulse:  88  Temp: 97.4 F (36.3 C)   TempSrc: Oral   Height: 5\' 7"  (1.702 m)   Weight: 165 lb 3.2 oz (74.934 kg)   SpO2:  92%  ,  Current Encounter SPO2  05/28/12 0913 92%  06/20/11 1605 87%   Wt Readings from Last 3 Encounters:  05/28/12 165 lb 3.2 oz (74.934 kg)  05/16/12 167 lb 14.4 oz (76.159 kg)  03/18/12 171 lb 6.4 oz (77.747 kg)   Body mass index is 25.87 kg/(m^2).  General - No distress, thin ENT - Ears normal. Throat and pharynx normal. Neck supple. No adenopathy or masses in the neck or supraclavicular regions. Nasal septum midline, no deformities, nares patent, normal mucosa without swelling, no polyps, no bleeding. Paranasal sinuses are normal. No tenderness to the maxillary, frontal, ethmoids or mastoids. Cardiac - S1 and S2 normal, no murmurs, clicks, gallops or rubs. Regular rate and rhythm.  No edema or JVD. Chest - Chest is clear, no wheezing or rales. Normal symmetric air entry throughout both lung fields. No chest wall deformities or tenderness. Back - Cervical, thoracic and lumbar spine exam is normal without tenderness, masses or kyphoscoliosis. Full range of motion without pain is noted. Abd - soft without tenderness, guarding, mass, rebound or organomegaly. Bowel sounds are normal. No CVA tenderness noted. Ext - good pulses, motor strength and sensation. Neuro - Cranial nerves are normal. PERLA. EOM's intact. Skin - no discernible active dermatitis, erythema, urticaria or inflammatory process. Psych - normal mood, and behavior.  PFT 09/06/06>>FEV1 3.24(115%), FEV1% 63, TLC 6.70(114%), DLCO 53%, no BD  Ct Chest W Contrast  05/13/2012  *RADIOLOGY REPORT*  Clinical Data:  Metastatic colon cancer.  Additional history of lung cancer.   CT CHEST, ABDOMEN AND PELVIS WITH CONTRAST   Technique:  Multidetector CT imaging of the chest, abdomen and pelvis was  performed following the standard protocol during bolus administration of intravenous contrast.   Contrast: OMNIPAQUE IOHEXOL 300 MG/ML  SOLN   Comparison:  CT 02/13/2012  CT CHEST   Findings:  No axillary or supraclavicular lymphadenopathy.  10 mm subcarinal node and 10 mm left  adjacent inferior bronchial node measure slightly larger than 7 mm and 8 mm on prior (image 31 and 34).  A small AP window node is 10 mm (image 25) compared to 9 mm on prior.  Small 5 mm prevascular node is unchanged (image 16).  Review of the lung parenchyma demonstrates central lobular emphysema.  Postsurgical change in left upper lobe is stable without new nodularity.  Right lower lobe ground-glass nodule is decreased in conspicuity and is difficult to detect on the background of peripheral reticular fibrosis seen throughout the lungs. IMPRESSION:  1.  Minimal increase in subcarinal lymph nodes.  Recommend attention on follow-up to exclude progression of metastatic adenopathy. 2.  Continued decrease in conspicuity of right lower lobe ground- glass nodule. 3.  Stable postoperative change in the left upper lobe.    CT ABDOMEN AND PELVIS   Findings:  No focal hepatic lesion.  The gallbladder, pancreas, spleen, adrenal glands are normal.  Kidneys are normal.  The stomach, small bowel, appendix, cecum are normal.  No evidence of colon obstruction nodularity.  There is again demonstrated peritoneal nodularity.  These small nodules are essentially stable compared to prior including 5 mm nodule in the greater curvature of the stomach (image 53), 5 mm nodule adjacent the spleen image 54, and 5 mm nodule adjacent to the IVC and  left renal vein (image 65).  There is nodular along the inferior aspect the right hepatic lobe (image 70) unchanged.   One nodule may be increased adjacent to the celiac trunk measuring 5 mm (image 58).  No new nodules present.  Within the pelvis, no new nodularity.  Prostate gland bladder normal.  No pelvic  lymphadenopathy. Review of  bone windows demonstrates no aggressive osseous lesions.   IMPRESSION:  1.  Stable peritoneal studding with small nodules within the left and right upper abdomen. 2.  One nodule adjacent to the celiac trunk measures slightly larger.  Recommend attention on follow-up.   Original Report Authenticated By: Genevive Bi, M.D.     CBC    Component Value Date/Time   WBC 6.3 05/21/2012 1313   WBC 7.2 07/14/2011 0406   RBC 3.80* 05/21/2012 1313   RBC 4.15* 07/14/2011 0406   HGB 12.5* 05/21/2012 1313   HGB 10.1* 07/14/2011 0406   HCT 35.9* 05/21/2012 1313   HCT 32.4* 07/14/2011 0406   PLT 106* 05/21/2012 1313   PLT 258 07/14/2011 0406   MCV 94.5 05/21/2012 1313   MCV 78.1 07/14/2011 0406   MCH 32.9 05/21/2012 1313   MCH 24.3* 07/14/2011 0406   MCHC 34.8 05/21/2012 1313   MCHC 31.2 07/14/2011 0406   RDW 17.1* 05/21/2012 1313   RDW 14.8 07/14/2011 0406   LYMPHSABS 2.4 05/21/2012 1313   LYMPHSABS PENDING 07/02/2011 1100   LYMPHSABS 1.2 07/02/2011 1100   MONOABS 0.6 05/21/2012 1313   MONOABS PENDING 07/02/2011 1100   MONOABS 0.5 07/02/2011 1100   EOSABS 0.3 05/21/2012 1313   EOSABS PENDING 07/02/2011 1100   EOSABS 0.4 07/02/2011 1100   BASOSABS 0.1 05/21/2012 1313   BASOSABS PENDING 07/02/2011 1100   BASOSABS 0.0 07/02/2011 1100    BMET    Component Value Date/Time   NA 140 05/21/2012 1313   NA 139 10/15/2011 1601   K 4.3 05/21/2012 1313   K 4.3 10/15/2011 1601   CL 107 05/21/2012 1313   CL 101 10/15/2011 1601   CO2 24 05/21/2012 1313   CO2 30 10/15/2011 1601   GLUCOSE 129* 05/21/2012 1313   GLUCOSE 122* 10/15/2011 1601   BUN 16 05/21/2012 1313   BUN 17 10/15/2011 1601   CREATININE 0.91 05/21/2012 1313   CREATININE 1.1 10/15/2011 1601   CALCIUM 9.7 05/21/2012 1313   CALCIUM 9.3 10/15/2011 1601   GFRNONAA >60 07/14/2011 0406   GFRAA >60 07/14/2011 0406    Lab Results  Component Value Date   ALT 17 05/21/2012   AST 21 05/21/2012   ALKPHOS 57 05/21/2012   BILITOT 1.3* 05/21/2012     Assessment/Plan:  Coralyn Helling, MD [facility] Pager:  [phone] 05/28/2012, 9:56 AM  Patient Instructions  Spiriva one puff daily Will arrange for breathing test (PFT) Will get report of overnight oxygen test from Dr. Waynard Edwards Follow up in one month

## 2012-05-28 NOTE — Patient Instructions (Addendum)
Spiriva one puff daily Will arrange for breathing test (PFT) Will get report of overnight oxygen test from Dr. Waynard Edwards Follow up in one month

## 2012-05-28 NOTE — Progress Notes (Deleted)
  Subjective:    Patient ID: [name], male    DOB: [birth year], 71 y.o.   MRN: [id number]  HPI    Review of Systems  Constitutional: Positive for appetite change, fatigue and unexpected weight change. Negative for fever.  HENT: Positive for congestion. Negative for ear pain, sore throat, sneezing, trouble swallowing and dental problem.   Respiratory: Positive for shortness of breath. Negative for cough.   Cardiovascular: Negative for chest pain, palpitations and leg swelling.  Gastrointestinal: Negative for anal bleeding.  Musculoskeletal: Negative for joint swelling.  Skin: Positive for rash.  Neurological: Negative for numbness.  Psychiatric/Behavioral: Negative for dysphoric mood. The patient is not nervous/anxious.        Objective:   Physical Exam        Assessment & Plan:

## 2012-05-28 NOTE — Assessment & Plan Note (Addendum)
This is likely related to COPD/emphysema possible hypoxemia.  If his symptoms do not improve with inhaler therapy and supplemental oxygen, then he may need to have further assessment for pulmonary hypertension and cardiac disease.  There is nothing in history or exam to suggest thrombo-embolic disease.

## 2012-05-28 NOTE — Telephone Encounter (Signed)
I called [facility] and spoke with Amy and advised that rx was sent for spiriva today at time of OV but this was in error and needs to be cancelled. She states it has not come through yet but she will make a note to cancel once it does come through. I have sent to refills to pt local pharmacy. Pt is aware. Carron Curie, CMA

## 2012-06-02 ENCOUNTER — Other Ambulatory Visit: Payer: Medicare Other

## 2012-06-03 ENCOUNTER — Ambulatory Visit
Admission: RE | Admit: 2012-06-03 | Discharge: 2012-06-03 | Disposition: A | Discharge status: Home / Self Care | Payer: Medicare Other | Source: Ambulatory Visit | Attending: Internal Medicine | Admitting: Internal Medicine

## 2012-06-03 ENCOUNTER — Encounter: Payer: Self-pay | Admitting: Pulmonary Disease

## 2012-06-03 ENCOUNTER — Telehealth: Payer: Self-pay | Admitting: Pulmonary Disease

## 2012-06-03 ENCOUNTER — Encounter: Payer: Self-pay | Admitting: Emergency Medicine

## 2012-06-03 DIAGNOSIS — K831 Obstruction of bile duct: Secondary | ICD-10-CM

## 2012-06-03 NOTE — Telephone Encounter (Signed)
Room air ONO 05/28/12>>Test time 7 hrs 39 min.  Baseline SpO2 85%, low SpO2 70%.  Spent 5 hrs 21 min with SpO2 < 88%.  D/w pt.  He has been started on oxygen by PCP.

## 2012-06-04 ENCOUNTER — Other Ambulatory Visit (HOSPITAL_BASED_OUTPATIENT_CLINIC_OR_DEPARTMENT_OTHER): Payer: Medicare Other | Admitting: Lab

## 2012-06-04 ENCOUNTER — Other Ambulatory Visit: Payer: Medicare Other | Admitting: Lab

## 2012-06-04 ENCOUNTER — Ambulatory Visit: Payer: Medicare Other

## 2012-06-04 ENCOUNTER — Ambulatory Visit (HOSPITAL_BASED_OUTPATIENT_CLINIC_OR_DEPARTMENT_OTHER): Payer: Medicare Other

## 2012-06-04 VITALS — BP 114/66 | HR 81 | Temp 97.2°F

## 2012-06-04 DIAGNOSIS — Z5112 Encounter for antineoplastic immunotherapy: Secondary | ICD-10-CM

## 2012-06-04 DIAGNOSIS — C3410 Malignant neoplasm of upper lobe, unspecified bronchus or lung: Secondary | ICD-10-CM

## 2012-06-04 DIAGNOSIS — C185 Malignant neoplasm of splenic flexure: Secondary | ICD-10-CM

## 2012-06-04 DIAGNOSIS — C189 Malignant neoplasm of colon, unspecified: Secondary | ICD-10-CM

## 2012-06-04 LAB — CBC WITH DIFFERENTIAL/PLATELET
BASO%: 0.6 % (ref 0.0–2.0)
MCHC: 35 g/dL (ref 32.0–36.0)
MONO#: 0.5 10*3/uL (ref 0.1–0.9)
RBC: 3.82 10*6/uL — ABNORMAL LOW (ref 4.20–5.82)
WBC: 6.7 10*3/uL (ref 4.0–10.3)
lymph#: 2 10*3/uL (ref 0.9–3.3)

## 2012-06-04 LAB — COMPREHENSIVE METABOLIC PANEL
ALT: 15 U/L (ref 0–53)
CO2: 25 mEq/L (ref 19–32)
Calcium: 10.4 mg/dL (ref 8.4–10.5)
Chloride: 104 mEq/L (ref 96–112)
Sodium: 137 mEq/L (ref 135–145)
Total Protein: 6.9 g/dL (ref 6.0–8.3)

## 2012-06-04 MED ORDER — SODIUM CHLORIDE 0.9 % IV SOLN
Freq: Once | INTRAVENOUS | Status: AC
  Administered 2012-06-04: 16:00:00 via INTRAVENOUS

## 2012-06-04 MED ORDER — SODIUM CHLORIDE 0.9 % IV SOLN
4.7000 mg/kg | Freq: Once | INTRAVENOUS | Status: AC
  Administered 2012-06-04: 400 mg via INTRAVENOUS
  Filled 2012-06-04: qty 16

## 2012-06-04 NOTE — Patient Instructions (Signed)
[facility] Discharge Instructions for Patients Receiving Chemotherapy  Today you received the following chemotherapy agents Avastin.  To help prevent nausea and vomiting after your treatment, we encourage you to take your nausea medication.  If you develop nausea and vomiting that is not controlled by your nausea medication, call the clinic. If it is after clinic hours your family physician or the after hours number for the clinic or go to the Emergency Department.   BELOW ARE SYMPTOMS THAT SHOULD BE REPORTED IMMEDIATELY:  *FEVER GREATER THAN 100.5 F  *CHILLS WITH OR WITHOUT FEVER  NAUSEA AND VOMITING THAT IS NOT CONTROLLED WITH YOUR NAUSEA MEDICATION  *UNUSUAL SHORTNESS OF BREATH  *UNUSUAL BRUISING OR BLEEDING  TENDERNESS IN MOUTH AND THROAT WITH OR WITHOUT PRESENCE OF ULCERS  *URINARY PROBLEMS  *BOWEL PROBLEMS  UNUSUAL RASH Items with * indicate a potential emergency and should be followed up as soon as possible.  One of the nurses will contact you 24 hours after your treatment. Please let the nurse know about any problems that you may have experienced. Feel free to call the clinic you have any questions or concerns. The clinic phone number is [phone].   I have been informed and understand all the instructions given to me. I know to contact the clinic, my physician, or go to the Emergency Department if any problems should occur. I do not have any questions at this time, but understand that I may call the clinic during office hours   should I have any questions or need assistance in obtaining follow up care.    __________________________________________  _____________  __________ Signature of Patient or Authorized Representative            Date                   Time    __________________________________________ Nurse's Signature    

## 2012-06-11 ENCOUNTER — Other Ambulatory Visit: Payer: Self-pay | Admitting: *Deleted

## 2012-06-11 DIAGNOSIS — J439 Emphysema, unspecified: Secondary | ICD-10-CM

## 2012-06-11 MED ORDER — TIOTROPIUM BROMIDE MONOHYDRATE 18 MCG IN CAPS: 18.0000 ug | ORAL_CAPSULE | Freq: Every day | RESPIRATORY_TRACT | Status: DC

## 2012-06-12 ENCOUNTER — Telehealth: Payer: Self-pay | Admitting: Pulmonary Disease

## 2012-06-12 NOTE — Telephone Encounter (Signed)
I spoke with pt and advised him his sprivia was sent for 90 day supply. Nothing further was needed

## 2012-06-17 ENCOUNTER — Other Ambulatory Visit: Payer: Self-pay | Admitting: Pharmacist

## 2012-06-17 ENCOUNTER — Telehealth: Payer: Self-pay | Admitting: *Deleted

## 2012-06-17 DIAGNOSIS — C189 Malignant neoplasm of colon, unspecified: Secondary | ICD-10-CM

## 2012-06-17 NOTE — Telephone Encounter (Signed)
[facility] faxed confirmation of prescription shipment.  Shipped Xeloda 06-16-2012 with next business day delivery.

## 2012-06-18 ENCOUNTER — Ambulatory Visit (HOSPITAL_BASED_OUTPATIENT_CLINIC_OR_DEPARTMENT_OTHER): Payer: Medicare Other

## 2012-06-18 ENCOUNTER — Other Ambulatory Visit (HOSPITAL_BASED_OUTPATIENT_CLINIC_OR_DEPARTMENT_OTHER): Payer: Medicare Other | Admitting: Lab

## 2012-06-18 VITALS — BP 113/63 | HR 69 | Temp 97.0°F

## 2012-06-18 DIAGNOSIS — Z5112 Encounter for antineoplastic immunotherapy: Secondary | ICD-10-CM

## 2012-06-18 DIAGNOSIS — C185 Malignant neoplasm of splenic flexure: Secondary | ICD-10-CM

## 2012-06-18 DIAGNOSIS — C189 Malignant neoplasm of colon, unspecified: Secondary | ICD-10-CM

## 2012-06-18 DIAGNOSIS — C3410 Malignant neoplasm of upper lobe, unspecified bronchus or lung: Secondary | ICD-10-CM

## 2012-06-18 LAB — CBC WITH DIFFERENTIAL/PLATELET
Basophils Absolute: 0 10*3/uL (ref 0.0–0.1)
EOS%: 6.5 % (ref 0.0–7.0)
Eosinophils Absolute: 0.5 10*3/uL (ref 0.0–0.5)
HCT: 38.1 % — ABNORMAL LOW (ref 38.4–49.9)
HGB: 13.4 g/dL (ref 13.0–17.1)
MCH: 33.3 pg (ref 27.2–33.4)
MCV: 94.8 fL (ref 79.3–98.0)
NEUT#: 4.5 10*3/uL (ref 1.5–6.5)
NEUT%: 59.1 % (ref 39.0–75.0)
RDW: 17.8 % — ABNORMAL HIGH (ref 11.0–14.6)
lymph#: 2.1 10*3/uL (ref 0.9–3.3)

## 2012-06-18 LAB — COMPREHENSIVE METABOLIC PANEL
Albumin: 3.6 g/dL (ref 3.5–5.2)
Alkaline Phosphatase: 49 U/L (ref 39–117)
BUN: 13 mg/dL (ref 6–23)
CO2: 23 mEq/L (ref 19–32)
Calcium: 10.2 mg/dL (ref 8.4–10.5)
Chloride: 106 mEq/L (ref 96–112)
Glucose, Bld: 87 mg/dL (ref 70–99)
Potassium: 4 mEq/L (ref 3.5–5.3)
Sodium: 140 mEq/L (ref 135–145)
Total Protein: 7.1 g/dL (ref 6.0–8.3)

## 2012-06-18 LAB — UA PROTEIN, DIPSTICK - CHCC: Protein, ur: NEGATIVE mg/dL

## 2012-06-18 MED ORDER — SODIUM CHLORIDE 0.9 % IV SOLN
4.7000 mg/kg | Freq: Once | INTRAVENOUS | Status: AC
  Administered 2012-06-18: 400 mg via INTRAVENOUS
  Filled 2012-06-18: qty 16

## 2012-06-18 NOTE — Patient Instructions (Addendum)
[facility] Discharge Instructions for Patients Receiving Chemotherapy  Today you received the following chemotherapy agents avastin  To help prevent nausea and vomiting after your treatment, we encourage you to take your nausea medication      If you develop nausea and vomiting that is not controlled by your nausea medication, call the clinic. If it is after clinic hours your family physician or the after hours number for the clinic or go to the Emergency Department.   BELOW ARE SYMPTOMS THAT SHOULD BE REPORTED IMMEDIATELY:  *FEVER GREATER THAN 100.5 F  *CHILLS WITH OR WITHOUT FEVER  NAUSEA AND VOMITING THAT IS NOT CONTROLLED WITH YOUR NAUSEA MEDICATION  *UNUSUAL SHORTNESS OF BREATH  *UNUSUAL BRUISING OR BLEEDING  TENDERNESS IN MOUTH AND THROAT WITH OR WITHOUT PRESENCE OF ULCERS  *URINARY PROBLEMS  *BOWEL PROBLEMS  UNUSUAL RASH Items with * indicate a potential emergency and should be followed up as soon as possible.   . The clinic phone number is [phone].   I have been informed and understand all the instructions given to me. I know to contact the clinic, my physician, or go to the Emergency Department if any problems should occur. I do not have any questions at this time, but understand that I may call the clinic during office hours   should I have any questions or need assistance in obtaining follow up care.    __________________________________________  _____________  __________ Signature of Patient or Authorized Representative            Date                   Time    __________________________________________ Nurse's Signature    

## 2012-06-26 ENCOUNTER — Other Ambulatory Visit: Payer: Self-pay | Admitting: Oncology

## 2012-07-02 ENCOUNTER — Encounter: Payer: Self-pay | Admitting: Pulmonary Disease

## 2012-07-02 ENCOUNTER — Other Ambulatory Visit (HOSPITAL_BASED_OUTPATIENT_CLINIC_OR_DEPARTMENT_OTHER): Payer: Medicare Other | Admitting: Lab

## 2012-07-02 ENCOUNTER — Ambulatory Visit: Payer: Medicare Other

## 2012-07-02 ENCOUNTER — Ambulatory Visit (HOSPITAL_BASED_OUTPATIENT_CLINIC_OR_DEPARTMENT_OTHER): Payer: Medicare Other

## 2012-07-02 VITALS — BP 107/61 | HR 71 | Temp 97.8°F

## 2012-07-02 DIAGNOSIS — Z5112 Encounter for antineoplastic immunotherapy: Secondary | ICD-10-CM

## 2012-07-02 DIAGNOSIS — C185 Malignant neoplasm of splenic flexure: Secondary | ICD-10-CM

## 2012-07-02 DIAGNOSIS — C189 Malignant neoplasm of colon, unspecified: Secondary | ICD-10-CM

## 2012-07-02 DIAGNOSIS — C787 Secondary malignant neoplasm of liver and intrahepatic bile duct: Secondary | ICD-10-CM

## 2012-07-02 DIAGNOSIS — C3410 Malignant neoplasm of upper lobe, unspecified bronchus or lung: Secondary | ICD-10-CM

## 2012-07-02 LAB — CBC WITH DIFFERENTIAL/PLATELET
BASO%: 0.5 % (ref 0.0–2.0)
Eosinophils Absolute: 0.4 10*3/uL (ref 0.0–0.5)
LYMPH%: 25 % (ref 14.0–49.0)
MCHC: 34.8 g/dL (ref 32.0–36.0)
MONO#: 0.5 10*3/uL (ref 0.1–0.9)
NEUT#: 4.1 10*3/uL (ref 1.5–6.5)
Platelets: 136 10*3/uL — ABNORMAL LOW (ref 140–400)
RBC: 4.02 10*6/uL — ABNORMAL LOW (ref 4.20–5.82)
RDW: 17.6 % — ABNORMAL HIGH (ref 11.0–14.6)
WBC: 6.6 10*3/uL (ref 4.0–10.3)
lymph#: 1.7 10*3/uL (ref 0.9–3.3)
nRBC: 0 % (ref 0–0)

## 2012-07-02 LAB — COMPREHENSIVE METABOLIC PANEL
CO2: 24 mEq/L (ref 19–32)
Creatinine, Ser: 1.04 mg/dL (ref 0.50–1.35)
Glucose, Bld: 103 mg/dL — ABNORMAL HIGH (ref 70–99)
Total Bilirubin: 0.8 mg/dL (ref 0.3–1.2)

## 2012-07-02 LAB — LACTATE DEHYDROGENASE: LDH: 206 U/L (ref 94–250)

## 2012-07-02 MED ORDER — SODIUM CHLORIDE 0.9 % IV SOLN
4.7000 mg/kg | Freq: Once | INTRAVENOUS | Status: AC
  Administered 2012-07-02: 400 mg via INTRAVENOUS
  Filled 2012-07-02: qty 16

## 2012-07-02 MED ORDER — SODIUM CHLORIDE 0.9 % IV SOLN
Freq: Once | INTRAVENOUS | Status: AC
  Administered 2012-07-02: 16:00:00 via INTRAVENOUS

## 2012-07-02 NOTE — Patient Instructions (Signed)
[facility] Discharge Instructions for Patients Receiving Chemotherapy  Today you received the following chemotherapy agents Avastin.  To help prevent nausea and vomiting after your treatment, we encourage you to take your nausea medication.   s.If you develop nausea and vomiting that is not controlled by your nausea medication, call the clinic. If it is after clinic hours your family physician or the after hours number for the clinic or go to the Emergency Department.   BELOW ARE SYMPTOMS THAT SHOULD BE REPORTED IMMEDIATELY:  *FEVER GREATER THAN 100.5 F  *CHILLS WITH OR WITHOUT FEVER  NAUSEA AND VOMITING THAT IS NOT CONTROLLED WITH YOUR NAUSEA MEDICATION  *UNUSUAL SHORTNESS OF BREATH  *UNUSUAL BRUISING OR BLEEDING  TENDERNESS IN MOUTH AND THROAT WITH OR WITHOUT PRESENCE OF ULCERS  *URINARY PROBLEMS  *BOWEL PROBLEMS  UNUSUAL RASH Items with * indicate a potential emergency and should be followed up as soon as possible.  One of the nurses will contact you 24 hours after your treatment. Please let the nurse know about any problems that you may have experienced. Feel free to call the clinic you have any questions or concerns. The clinic phone number is [phone].   I have been informed and understand all the instructions given to me. I know to contact the clinic, my physician, or go to the Emergency Department if any problems should occur. I do not have any questions at this time, but understand that I may call the clinic during office hours   should I have any questions or need assistance in obtaining follow up care.    __________________________________________  _____________  __________ Signature of Patient or Authorized Representative            Date                   Time    __________________________________________ Nurse's Signature

## 2012-07-03 LAB — CEA: CEA: 3.1 ng/mL (ref 0.0–5.0)

## 2012-07-04 ENCOUNTER — Telehealth: Payer: Self-pay | Admitting: Oncology

## 2012-07-04 ENCOUNTER — Ambulatory Visit: Payer: Medicare Other

## 2012-07-04 ENCOUNTER — Ambulatory Visit (HOSPITAL_BASED_OUTPATIENT_CLINIC_OR_DEPARTMENT_OTHER): Payer: Medicare Other | Admitting: Nurse Practitioner

## 2012-07-04 ENCOUNTER — Other Ambulatory Visit: Payer: Medicare Other | Admitting: Lab

## 2012-07-04 VITALS — BP 122/68 | HR 74 | Temp 97.0°F | Ht 67.0 in | Wt 171.1 lb

## 2012-07-04 DIAGNOSIS — C189 Malignant neoplasm of colon, unspecified: Secondary | ICD-10-CM

## 2012-07-04 DIAGNOSIS — C801 Malignant (primary) neoplasm, unspecified: Secondary | ICD-10-CM

## 2012-07-04 NOTE — Progress Notes (Signed)
OFFICE PROGRESS NOTE  Interval history:  [name] is a 71 year old man with colon cancer metastatic to the peritoneum. He is on active treatment with Xeloda 7 days on/7 days off and every 2 week Avastin. He is seen today for scheduled followup.  [name] reports that overall he is feeling well. His energy level and appetite have improved. He denies nausea/vomiting. No mouth sores. No diarrhea. No hand or foot pain or redness. He denies bleeding. Abdominal pain. No leg swelling or calf pain. No shortness of breath or chest pain.  He notes "hoarseness" for several days after each treatment with Avastin.  Neuropathy symptoms in the hands and feet are unchanged.  Over the past 2-3 weeks he has had intermittent low back pain. He thinks he may have strained a muscle. He takes Aleve with relief of the pain.   Objective: Blood pressure 122/68, pulse 74, temperature 97 F (36.1 C), temperature source Oral, height 5\' 7"  (1.702 m), weight 171 lb 1.6 oz (77.61 kg).  Oropharynx is without thrush or ulceration. Mucous membranes are pink and moist. No palpable cervical, supraclavicular or axillary lymph nodes. Lungs are clear. No wheezes or rales. Regular cardiac rhythm. Abdomen is soft and nontender. Extremities are without edema. Motor strength 5 over 5. Palms are without erythema.  Lab Results: Lab Results  Component Value Date   WBC 6.6 07/02/2012   HGB 13.2 07/02/2012   HCT 37.9* 07/02/2012   MCV 94.3 07/02/2012   PLT 136* 07/02/2012    Chemistry:    Chemistry      Component Value Date/Time   NA 139 07/02/2012 1538   NA 139 10/15/2011 1601   K 3.9 07/02/2012 1538   K 4.3 10/15/2011 1601   CL 106 07/02/2012 1538   CL 101 10/15/2011 1601   CO2 24 07/02/2012 1538   CO2 30 10/15/2011 1601   BUN 17 07/02/2012 1538   BUN 17 10/15/2011 1601   CREATININE 1.04 07/02/2012 1538   CREATININE 1.1 10/15/2011 1601      Component Value Date/Time   CALCIUM 10.1 07/02/2012 1538   CALCIUM 9.3 10/15/2011 1601    ALKPHOS 42 07/02/2012 1538   ALKPHOS 59 10/15/2011 1601   AST 17 07/02/2012 1538   AST 35 10/15/2011 1601   ALT 12 07/02/2012 1538   BILITOT 0.8 07/02/2012 1538   BILITOT 1.30 10/15/2011 1601     CEA 3.1 on 07/02/2012.  Studies/Results: No results found.  Medications: I have reviewed the patient's current medications.  Assessment/Plan:  1. 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 He is currently being treated with Xeloda on a 7 day on/7 day off schedule and every 2 week Avastin. The CEA remains in normal range. 2. Oxaliplatin neuropathy. Unchanged. 3. 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  4. Right lower lobe lung nodule. Less conspicuous on CT 05/13/2012. 5. 2-3 week history of intermittent low back pain. He will contact the office if the pain worsens or he develops any new symptoms. 6. Type 2 diabetes. 7. History of nephrolithiasis. 8. Status post bilateral corneal transplants.  Disposition-[name] appears stable. Plan to continue Xeloda and Avastin as above. Next restaging CT evaluation will be scheduled at a three-month interval from the previous. He will return for a followup visit with Dr. Cyndie Chime 08/08/2012. He will contact the office in the interim with any problems.  Lonna Cobb ANP/GNP-BC

## 2012-07-04 NOTE — Telephone Encounter (Signed)
appts made and printed for pt pt aware that i will call when me adds tx,email to mw

## 2012-07-09 ENCOUNTER — Encounter: Payer: Self-pay | Admitting: Pulmonary Disease

## 2012-07-09 ENCOUNTER — Ambulatory Visit (INDEPENDENT_AMBULATORY_CARE_PROVIDER_SITE_OTHER): Payer: Medicare Other | Admitting: Pulmonary Disease

## 2012-07-09 ENCOUNTER — Telehealth: Payer: Self-pay | Admitting: Oncology

## 2012-07-09 VITALS — BP 110/70 | HR 86 | Temp 97.3°F | Ht 67.0 in | Wt 166.0 lb

## 2012-07-09 DIAGNOSIS — R0600 Dyspnea, unspecified: Secondary | ICD-10-CM

## 2012-07-09 DIAGNOSIS — J438 Other emphysema: Secondary | ICD-10-CM

## 2012-07-09 DIAGNOSIS — C3490 Malignant neoplasm of unspecified part of unspecified bronchus or lung: Secondary | ICD-10-CM

## 2012-07-09 DIAGNOSIS — R0609 Other forms of dyspnea: Secondary | ICD-10-CM

## 2012-07-09 DIAGNOSIS — J439 Emphysema, unspecified: Secondary | ICD-10-CM

## 2012-07-09 DIAGNOSIS — R0902 Hypoxemia: Secondary | ICD-10-CM

## 2012-07-09 LAB — PULMONARY FUNCTION TEST

## 2012-07-09 MED ORDER — ALBUTEROL SULFATE HFA 108 (90 BASE) MCG/ACT IN AERS: 2.0000 | INHALATION_SPRAY | Freq: Four times a day (QID) | RESPIRATORY_TRACT | Status: DC | PRN

## 2012-07-09 NOTE — Assessment & Plan Note (Signed)
He is followed by Dr. Cyndie Chime.

## 2012-07-09 NOTE — Assessment & Plan Note (Signed)
He has been reluctant to use nocturnal oxygen due to the inconvenience.  I have d/w him the rationale for home oxygen use in relation to his cardiovascular health.  He will consider this, and discuss further with his PCP.

## 2012-07-09 NOTE — Assessment & Plan Note (Signed)
He has mild obstruction with moderate diffusion defect on PFT today.  He has not noticed improvement with spiriva.  Will have him stop this.  I have given him a script for proair to use as needed.  He would like to have further follow up with his PCP, and return to pulmonary only as needed.  I think this is reasonable.

## 2012-07-09 NOTE — Patient Instructions (Addendum)
Follow up with pulmonary as needed 

## 2012-07-09 NOTE — Progress Notes (Signed)
PFT done today. 

## 2012-07-09 NOTE — Telephone Encounter (Signed)
l/m for him to call for appt  sch  aom

## 2012-07-09 NOTE — Progress Notes (Signed)
Chief Complaint  Patient presents with  . Follow-up    w/ PFT. Breathing has unchanged since last OV. slight hoarseness and cough w/ chemo tx. denies any wheezing, chest tx    History of Present Illness: [name] is a 71 y.o. male male former smoker for evaluation of COPD and progressive dyspnea. Hx of NSCLC s/p Lt upper lobe wedge resection 2007, and second primary lung cancer Rt lower lobe 2012.  He is here to review his PFT and response to spiriva.  He does not feel spiriva helped any.  He ran out of his script 3 days ago, and has not felt any worsening of his symptoms.  He thinks his dyspnea was related to fatigue from chemotherapy.  This has improved.  He was sent up for home oxygen, but his PCP.  He has been reluctant to wear this.  The tubing gets tangled, and causes trouble with his sleep.    Past Medical History  Diagnosis Date  . COPD (chronic obstructive pulmonary disease)   . Nephrolithiasis   . Diverticulosis   . Hx of adenomatous colonic polyps 04/30/07  . Glaucoma   . Hyperlipidemia   . ED (erectile dysfunction)   . Lung nodule   . Hepatic steatosis   . Thoracic spondylosis   . Non-small cell lung cancer   . SOB (shortness of breath)   . Diabetes mellitus     type II  . Bruises easily   . Anemia   . Hearing loss     slight  . Contact lens/glasses fitting   . Rectal bleeding   . Hemorrhoids   . Glaucoma   . 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 Colon cancer     Past Surgical History  Procedure Date  . 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 Lung removal, partial 05/2006    Lt upper lobe wedge resection  . Tonsillectomy   . Kidney stone removal 07/2006  . Colonoscopy   . Polypectomy   . Colon surgery 06/2011    Outpatient Encounter Prescriptions as of 07/09/2012  Medication Sig Dispense Refill  . Alum & Mag Hydroxide-Simeth (MAALOX ADVANCED PO) Take by mouth as needed.        Marland Kitchen aspirin 81 MG tablet Take 81 mg by  mouth daily.        . Bevacizumab (AVASTIN IV) Inject into the vein. Every 2 weeks      . capecitabine (XELODA) 500 MG tablet Take 4 tablets (2,000 mg total) by mouth 2 (two) times daily after a meal. 7 days on; 7 days off.  Fax to [facility]  112 tablet  6  . Cholecalciferol (VITAMIN D3) 2000 UNITS TABS Take by mouth daily.        . cyanocobalamin 500 MCG tablet Take 1,000 mcg by mouth daily.       Marland Kitchen ezetimibe-simvastatin (VYTORIN) 10-40 MG per tablet 1/2 tab 2-3 times per week      . loratadine (CLARITIN) 10 MG tablet Take 10 mg by mouth 2 (two) times daily.        Marland Kitchen loteprednol (LOTEMAX) 0.5 % ophthalmic suspension Place 1 drop into the left eye daily.        . metFORMIN (GLUMETZA) 1000 MG (MOD) 24 hr tablet Take 1,000 mg by mouth 2 (two) times daily with a meal.        . Omega-3 Fatty Acids (FISH OIL) 1000 MG CAPS Take 6 capsules by mouth  2 (two) times daily.        . sitaGLIPtin (JANUVIA) 100 MG tablet Take 50 mg by mouth daily. Dose needs to be updated on next office visit.      Marland Kitchen testosterone enanthate (DELATESTRYL) 200 MG/ML injection Every 3 weeks currently      . timolol (BETIMOL) 0.25 % ophthalmic solution Place 1 drop into the left eye 2 (two) times daily.       Marland Kitchen tiotropium (SPIRIVA) 18 MCG inhalation capsule Place 1 capsule (18 mcg total) into inhaler and inhale daily.  90 capsule  3   Facility-Administered Encounter Medications as of 07/09/2012  Medication Dose Route Frequency Provider Last Rate Last Dose  . 0.9 %  sodium chloride infusion   Intravenous Once Levert Feinstein, MD      . dextrose 5 % solution   Intravenous Once Levert Feinstein, MD        No Known Allergies  Physical Exam:  Blood pressure 110/70, pulse 86, temperature 97.3 F (36.3 C), temperature source Oral, height 5\' 7"  (1.702 m), weight 166 lb (75.297 kg), SpO2 94.00%. Body mass index is 26.00 kg/(m^2). Wt Readings from Last 2 Encounters:  07/09/12 166 lb (75.297 kg)  07/04/12 171 lb 1.6 oz (77.61  kg)    General - No distress, thin  ENT - Ears normal. Throat and pharynx normal. Neck supple. No adenopathy or masses in the neck or supraclavicular regions. Nasal septum midline, no deformities, nares patent, normal mucosa without swelling, no polyps, no bleeding. Paranasal sinuses are normal. No tenderness to the maxillary, frontal, ethmoids or mastoids.  Cardiac - S1 and S2 normal, no murmurs, clicks, gallops or rubs. Regular rate and rhythm. No edema or JVD.  Chest - Chest is clear, no wheezing or rales. Normal symmetric air entry throughout both lung fields. No chest wall deformities or tenderness.  Back - Cervical, thoracic and lumbar spine exam is normal without tenderness, masses or kyphoscoliosis. Full range of motion without pain is noted.  Abd - soft without tenderness, guarding, mass, rebound or organomegaly. Bowel sounds are normal. No CVA tenderness noted.  Ext - good pulses, motor strength and sensation.  Neuro - Cranial nerves are normal. PERLA. EOM's intact.  Skin - no discernible active dermatitis, erythema, urticaria or inflammatory process.  Psych - normal mood, and behavior.  Room air ONO 05/28/12>>Test time 7 hrs 39 min. Baseline SpO2 85%, low SpO2 70%. Spent 5 hrs 21 min with SpO2 < 88%.  PFT 07/09/12>>FEV1 3.04 (116%), FEV1% 59, TLC 7.19 (124%), DLCO 44%, no BD  Assessment/Plan:  Coralyn Helling, MD [facility] Pager:  [phone] 07/09/2012, 11:21 AM

## 2012-07-09 NOTE — Telephone Encounter (Signed)
pt called and appts were given to pt aom

## 2012-07-09 NOTE — Assessment & Plan Note (Signed)
He thinks his dyspnea and fatigue were related to chemotherapy.  He feels this has improved.

## 2012-07-16 ENCOUNTER — Other Ambulatory Visit (HOSPITAL_BASED_OUTPATIENT_CLINIC_OR_DEPARTMENT_OTHER): Payer: Medicare Other | Admitting: Lab

## 2012-07-16 ENCOUNTER — Ambulatory Visit (HOSPITAL_BASED_OUTPATIENT_CLINIC_OR_DEPARTMENT_OTHER): Payer: Medicare Other

## 2012-07-16 VITALS — BP 111/72 | HR 79 | Temp 97.5°F

## 2012-07-16 DIAGNOSIS — C801 Malignant (primary) neoplasm, unspecified: Secondary | ICD-10-CM

## 2012-07-16 DIAGNOSIS — C189 Malignant neoplasm of colon, unspecified: Secondary | ICD-10-CM

## 2012-07-16 DIAGNOSIS — Z5112 Encounter for antineoplastic immunotherapy: Secondary | ICD-10-CM

## 2012-07-16 DIAGNOSIS — Z5111 Encounter for antineoplastic chemotherapy: Secondary | ICD-10-CM

## 2012-07-16 DIAGNOSIS — C3410 Malignant neoplasm of upper lobe, unspecified bronchus or lung: Secondary | ICD-10-CM

## 2012-07-16 DIAGNOSIS — C185 Malignant neoplasm of splenic flexure: Secondary | ICD-10-CM

## 2012-07-16 LAB — COMPREHENSIVE METABOLIC PANEL
AST: 16 U/L (ref 0–37)
Alkaline Phosphatase: 45 U/L (ref 39–117)
BUN: 14 mg/dL (ref 6–23)
Glucose, Bld: 88 mg/dL (ref 70–99)
Total Bilirubin: 0.8 mg/dL (ref 0.3–1.2)

## 2012-07-16 LAB — CBC WITH DIFFERENTIAL/PLATELET
Basophils Absolute: 0 10*3/uL (ref 0.0–0.1)
EOS%: 4.8 % (ref 0.0–7.0)
Eosinophils Absolute: 0.4 10*3/uL (ref 0.0–0.5)
HGB: 13.9 g/dL (ref 13.0–17.1)
LYMPH%: 20.3 % (ref 14.0–49.0)
MCH: 33 pg (ref 27.2–33.4)
MCV: 93.1 fL (ref 79.3–98.0)
MONO%: 5.7 % (ref 0.0–14.0)
NEUT#: 5.6 10*3/uL (ref 1.5–6.5)
Platelets: 161 10*3/uL (ref 140–400)
RBC: 4.21 10*6/uL (ref 4.20–5.82)
RDW: 17.6 % — ABNORMAL HIGH (ref 11.0–14.6)

## 2012-07-16 MED ORDER — SODIUM CHLORIDE 0.9 % IV SOLN
4.7000 mg/kg | Freq: Once | INTRAVENOUS | Status: AC
  Administered 2012-07-16: 400 mg via INTRAVENOUS
  Filled 2012-07-16: qty 16

## 2012-07-16 MED ORDER — SODIUM CHLORIDE 0.9 % IV SOLN
Freq: Once | INTRAVENOUS | Status: AC
  Administered 2012-07-16: 15:00:00 via INTRAVENOUS

## 2012-07-16 NOTE — Patient Instructions (Signed)
[facility] Discharge Instructions for Patients Receiving Chemotherapy  Today you received the following chemotherapy agents Avastin To help prevent nausea and vomiting after your treatment, we encourage you to take your nausea medication as prescribed.  If you develop nausea and vomiting that is not controlled by your nausea medication, call the clinic. If it is after clinic hours your family physician or the after hours number for the clinic or go to the Emergency Department.   BELOW ARE SYMPTOMS THAT SHOULD BE REPORTED IMMEDIATELY:  *FEVER GREATER THAN 100.5 F  *CHILLS WITH OR WITHOUT FEVER  NAUSEA AND VOMITING THAT IS NOT CONTROLLED WITH YOUR NAUSEA MEDICATION  *UNUSUAL SHORTNESS OF BREATH  *UNUSUAL BRUISING OR BLEEDING  TENDERNESS IN MOUTH AND THROAT WITH OR WITHOUT PRESENCE OF ULCERS  *URINARY PROBLEMS  *BOWEL PROBLEMS  UNUSUAL RASH Items with * indicate a potential emergency and should be followed up as soon as possible.  One of the nurses will contact you 24 hours after your treatment. Please let the nurse know about any problems that you may have experienced. Feel free to call the clinic you have any questions or concerns. The clinic phone number is [phone].   I have been informed and understand all the instructions given to me. I know to contact the clinic, my physician, or go to the Emergency Department if any problems should occur. I do not have any questions at this time, but understand that I may call the clinic during office hours   should I have any questions or need assistance in obtaining follow up care.    __________________________________________  _____________  __________ Signature of Patient or Authorized Representative            Date                   Time    __________________________________________ Nurse's Signature    

## 2012-07-17 ENCOUNTER — Encounter: Payer: Self-pay | Admitting: *Deleted

## 2012-07-17 NOTE — Progress Notes (Signed)
RECEIVED A FAX FROM [facility] CONCERNING A CONFIRMATION OF PRESCRIPTION SHIPMENT FOR XELODA ON 07/16/12.

## 2012-07-30 ENCOUNTER — Other Ambulatory Visit (HOSPITAL_BASED_OUTPATIENT_CLINIC_OR_DEPARTMENT_OTHER): Payer: Medicare Other

## 2012-07-30 ENCOUNTER — Ambulatory Visit (HOSPITAL_BASED_OUTPATIENT_CLINIC_OR_DEPARTMENT_OTHER): Payer: Medicare Other

## 2012-07-30 VITALS — BP 109/68 | HR 78 | Temp 97.2°F | Resp 20

## 2012-07-30 DIAGNOSIS — C189 Malignant neoplasm of colon, unspecified: Secondary | ICD-10-CM

## 2012-07-30 DIAGNOSIS — Z5112 Encounter for antineoplastic immunotherapy: Secondary | ICD-10-CM

## 2012-07-30 DIAGNOSIS — C3410 Malignant neoplasm of upper lobe, unspecified bronchus or lung: Secondary | ICD-10-CM

## 2012-07-30 DIAGNOSIS — C185 Malignant neoplasm of splenic flexure: Secondary | ICD-10-CM

## 2012-07-30 LAB — CBC WITH DIFFERENTIAL/PLATELET
Basophils Absolute: 0 10*3/uL (ref 0.0–0.1)
EOS%: 6.4 % (ref 0.0–7.0)
Eosinophils Absolute: 0.5 10*3/uL (ref 0.0–0.5)
HGB: 15.1 g/dL (ref 13.0–17.1)
LYMPH%: 21.9 % (ref 14.0–49.0)
MCH: 32.8 pg (ref 27.2–33.4)
MCV: 92.2 fL (ref 79.3–98.0)
MONO%: 8.7 % (ref 0.0–14.0)
NEUT#: 4.6 10*3/uL (ref 1.5–6.5)
Platelets: 106 10*3/uL — ABNORMAL LOW (ref 140–400)
RBC: 4.6 10*6/uL (ref 4.20–5.82)
RDW: 17.8 % — ABNORMAL HIGH (ref 11.0–14.6)

## 2012-07-30 LAB — UA PROTEIN, DIPSTICK - CHCC: Protein, ur: NEGATIVE mg/dL

## 2012-07-30 MED ORDER — SODIUM CHLORIDE 0.9 % IV SOLN
Freq: Once | INTRAVENOUS | Status: AC
  Administered 2012-07-30: 15:00:00 via INTRAVENOUS

## 2012-07-30 MED ORDER — SODIUM CHLORIDE 0.9 % IV SOLN
4.7000 mg/kg | Freq: Once | INTRAVENOUS | Status: AC
  Administered 2012-07-30: 400 mg via INTRAVENOUS
  Filled 2012-07-30: qty 16

## 2012-07-30 NOTE — Patient Instructions (Signed)
[facility] Discharge Instructions for Patients Receiving Chemotherapy  Today you received the following chemotherapy agents Avastin  To help prevent nausea and vomiting after your treatment, we encourage you to take your nausea medication  Begin taking it at 7 pm and take it as often as prescribed for the next 24 to 72 hours.   If you develop nausea and vomiting that is not controlled by your nausea medication, call the clinic. If it is after clinic hours your family physician or the after hours number for the clinic or go to the Emergency Department.   BELOW ARE SYMPTOMS THAT SHOULD BE REPORTED IMMEDIATELY:  *FEVER GREATER THAN 100.5 F  *CHILLS WITH OR WITHOUT FEVER  NAUSEA AND VOMITING THAT IS NOT CONTROLLED WITH YOUR NAUSEA MEDICATION  *UNUSUAL SHORTNESS OF BREATH  *UNUSUAL BRUISING OR BLEEDING  TENDERNESS IN MOUTH AND THROAT WITH OR WITHOUT PRESENCE OF ULCERS  *URINARY PROBLEMS  *BOWEL PROBLEMS  UNUSUAL RASH Items with * indicate a potential emergency and should be followed up as soon as possible.  One of the nurses will contact you 24 hours after your treatment. Please let the nurse know about any problems that you may have experienced. Feel free to call the clinic you have any questions or concerns. The clinic phone number is [phone].   I have been informed and understand all the instructions given to me. I know to contact the clinic, my physician, or go to the Emergency Department if any problems should occur. I do not have any questions at this time, but understand that I may call the clinic during office hours   should I have any questions or need assistance in obtaining follow up care.    __________________________________________  _____________  __________ Signature of Patient or Authorized Representative            Date                   Time    __________________________________________ Nurse's Signature    

## 2012-08-06 ENCOUNTER — Ambulatory Visit (HOSPITAL_COMMUNITY)
Admission: RE | Admit: 2012-08-06 | Discharge: 2012-08-06 | Disposition: A | Discharge status: Home / Self Care | Payer: Medicare Other | Source: Ambulatory Visit | Attending: Nurse Practitioner | Admitting: Nurse Practitioner

## 2012-08-06 DIAGNOSIS — Z79899 Other long term (current) drug therapy: Secondary | ICD-10-CM | POA: Insufficient documentation

## 2012-08-06 DIAGNOSIS — C189 Malignant neoplasm of colon, unspecified: Secondary | ICD-10-CM | POA: Insufficient documentation

## 2012-08-06 DIAGNOSIS — K669 Disorder of peritoneum, unspecified: Secondary | ICD-10-CM | POA: Insufficient documentation

## 2012-08-06 DIAGNOSIS — Z85118 Personal history of other malignant neoplasm of bronchus and lung: Secondary | ICD-10-CM | POA: Insufficient documentation

## 2012-08-06 MED ORDER — IOHEXOL 300 MG/ML  SOLN
100.0000 mL | Freq: Once | INTRAMUSCULAR | Status: AC | PRN
  Administered 2012-08-06: 100 mL via INTRAVENOUS

## 2012-08-08 ENCOUNTER — Ambulatory Visit (HOSPITAL_BASED_OUTPATIENT_CLINIC_OR_DEPARTMENT_OTHER): Payer: Medicare Other | Admitting: Oncology

## 2012-08-08 VITALS — BP 108/69 | HR 76 | Temp 97.8°F | Resp 18 | Ht 67.0 in | Wt 169.7 lb

## 2012-08-08 DIAGNOSIS — C3410 Malignant neoplasm of upper lobe, unspecified bronchus or lung: Secondary | ICD-10-CM

## 2012-08-08 DIAGNOSIS — C779 Secondary and unspecified malignant neoplasm of lymph node, unspecified: Secondary | ICD-10-CM

## 2012-08-08 DIAGNOSIS — C185 Malignant neoplasm of splenic flexure: Secondary | ICD-10-CM

## 2012-08-08 DIAGNOSIS — C786 Secondary malignant neoplasm of retroperitoneum and peritoneum: Secondary | ICD-10-CM

## 2012-08-08 DIAGNOSIS — C3490 Malignant neoplasm of unspecified part of unspecified bronchus or lung: Secondary | ICD-10-CM

## 2012-08-08 DIAGNOSIS — C189 Malignant neoplasm of colon, unspecified: Secondary | ICD-10-CM

## 2012-08-08 NOTE — Progress Notes (Signed)
Hematology and Oncology Follow Up Visit  [name] [id number] 1941-05-26 71 y.o. 08/08/2012 6:49 PM   Principle Diagnosis: Encounter Diagnoses  Name Primary?  . 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 Lung cancer      Interim History:   A followup visit for this 71 year old man with local intra-abdominal recurrence of colon cancer initial stage III diagnosed in in July 2012 status post left colectomy 07/09/2011. He developed a rise in CEA tumor marker and 2 intra-abdominal soft tissue densities on followup CT scan done in November 2012 while on adjuvant Xeloda chemotherapy. Oxaliplatin and Avastin were added to his regimen. Oxaliplatinum was started 11/26/2011. He has had a steady fall in his CEA tumor marker from peak value of 12.6 on 11/02/2011 to   2.3 on 05/07/2012. Most recent value 3.1 on July 10. 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 We were following his symptoms closely but recently he had a sudden worsening of neuropathy, hands greater than feet, and was having trouble buttoning buttons and uncomfortable dysesthesias. I had to stop the Oxaliplatinum. He continues on Xeloda one week on 1 week off and every two-week Avastin. .  Of interest is the fact that since we added Avastin to his regimen, the right lower lobe lung lesion has decreased in size and is no longer obvious on the current study. This represents a second primary lung cancer unrelated to his colon cancer.  Clinically he is stable. Neuropathy has not progressed but has not reversed either. Appetite has improved. He has gained back some of the weight that he lost up from 166 pounds in July to 170 pounds today since stopping Oxaliplatinum.   Medications: reviewed  Allergies: No Known Allergies  Review of Systems: Constitutional:   Improving constitutional symptoms Respiratory: He denies cough or dyspnea she Cardiovascular:  No chest pain or palpitations Gastrointestinal: No  abdominal pain, no change in bowel habit, no hematochezia or melena Genito-Urinary: No urinary tract symptoms Musculoskeletal: No muscle or bone pain Neurologic: Persistent neuropathy hands and soles of his feet Skin: No rash Remaining ROS negative.  Physical Exam: Blood pressure 108/69, pulse 76, temperature 97.8 F (36.6 C), temperature source Oral, resp. rate 18, height 5\' 7"  (1.702 m), weight 169 lb 11.2 oz (76.975 kg). Wt Readings from Last 3 Encounters:  08/08/12 169 lb 11.2 oz (76.975 kg)  07/09/12 166 lb (75.297 kg)  07/04/12 171 lb 1.6 oz (77.61 kg)     General appearance: Thin Caucasian man HENNT: Pharynx no erythema exudate or ulcer Lymph nodes: No adenopathy Breasts: Lungs: Clear to auscultation resonant to percussion Heart: Regular rhythm no murmur Abdomen: Soft nontender no mass no organomegaly Extremities: No edema no calf tenderness Vascular: No cyanosis Neurologic: No focal deficit. Sensory exam not done today. Skin: No rash or ecchymosis. Feet not examined.  Lab Results: Lab Results  Component Value Date   WBC 7.4 07/30/2012   HGB 15.1 07/30/2012   HCT 42.4 07/30/2012   MCV 92.2 07/30/2012   PLT 106* 07/30/2012     Chemistry      Component Value Date/Time   NA 141 07/16/2012 1354   NA 139 10/15/2011 1601   K 4.3 07/16/2012 1354   K 4.3 10/15/2011 1601   CL 105 07/16/2012 1354   CL 101 10/15/2011 1601   CO2 24 07/16/2012 1354   CO2 30 10/15/2011 1601   BUN 14 07/16/2012 1354   BUN 17 10/15/2011 1601   CREATININE 0.92 07/16/2012 1354  CREATININE 1.1 10/15/2011 1601      Component Value Date/Time   CALCIUM 10.3 07/16/2012 1354   CALCIUM 9.3 10/15/2011 1601   ALKPHOS 45 07/16/2012 1354   ALKPHOS 59 10/15/2011 1601   AST 16 07/16/2012 1354   AST 35 10/15/2011 1601   ALT 10 07/16/2012 1354   BILITOT 0.8 07/16/2012 1354   BILITOT 1.30 10/15/2011 1601       Radiological Studies: Ct Chest W Contrast  08/06/2012  *RADIOLOGY REPORT*  Clinical Data:  Colon  cancer diagnosed 2012.  History lung cancer. Chemotherapy ongoing.  CT CHEST, ABDOMEN AND PELVIS WITH CONTRAST  Technique:  Multidetector CT imaging of the chest, abdomen and pelvis was performed following the standard protocol during bolus administration of intravenous contrast.  Contrast: OMNIPAQUE IOHEXOL 300 MG/ML  SOLN  Comparison:   CT 05/13/2012  CT CHEST  Findings:  No axillary or supraclavicular lymphadenopathy.  There are small mediastinal lymph nodes not appreciably changed in size compared to prior.  For example subcarinal 10 mm lymph node is not changed from 10 mm on prior.  No new adenopathy.  Review lung parenchyma demonstrates postsurgical change in the left upper lobe with scarring and thickening.  There is severe emphysematous change within both lungs.  There are no new or suspicious pulmonary nodules.  IMPRESSION:  No evidence of metastasis or local recurrence within the thorax.  CT ABDOMEN AND PELVIS  Findings:  There are multiple peritoneal nodules some which are slightly more prominent than prior.  For example nodule adjacent transverse colon measuring 5 mm (image 63)  compared to approximately 3 mm on prior.  Nodule adjacent to the splenic flexure of the  transverse colon measures 9 mm compared to approximately 6 mm on prior.  Nodule adjacent to the right hepatic lobe measures 7 mm (image 69) not changed 7 mm on prior.  Small celiac lymph node measuring 5 mm is stable.  Central mesentery 11 mm lymph node (image 69 ) may be increased from 10 mm on prior.  No focal hepatic lesion.  The gallbladder, pancreas, spleen, adrenal glands and kidneys are normal.  The stomach demonstrates diffuse wall thickening which may relate to nondistension.  The bowel obstruction.  Moderate volume stool throughout the colon without obstructing lesion.  Abdominal aorta normal caliber.  No retroperitoneal periportal lymphadenopathy.  No free fluid the pelvis.  Prostate gland bladder normal.  No pelvic  lymphadenopathy. Review of  bone windows demonstrates no aggressive osseous lesions.  IMPRESSION:  1.  Mild progression of nodular implants within the peritoneal space associated with the transverse colon and along the liver margin. 2.  Small central mesenteric lymph nodes are stable to slightly increased. 3.  Moderate volume stool without obstructing lesion.  Original Report Authenticated By: Genevive Bi, M.D.   Ct Abdomen Pelvis W Contrast  08/06/2012  *RADIOLOGY REPORT*  Clinical Data:  Colon cancer diagnosed 2012.  History lung cancer. Chemotherapy ongoing.  CT CHEST, ABDOMEN AND PELVIS WITH CONTRAST  Technique:  Multidetector CT imaging of the chest, abdomen and pelvis was performed following the standard protocol during bolus administration of intravenous contrast.  Contrast: OMNIPAQUE IOHEXOL 300 MG/ML  SOLN  Comparison:   CT 05/13/2012  CT CHEST  Findings:  No axillary or supraclavicular lymphadenopathy.  There are small mediastinal lymph nodes not appreciably changed in size compared to prior.  For example subcarinal 10 mm lymph node is not changed from 10 mm on prior.  No new adenopathy.  Review lung  parenchyma demonstrates postsurgical change in the left upper lobe with scarring and thickening.  There is severe emphysematous change within both lungs.  There are no new or suspicious pulmonary nodules.  IMPRESSION:  No evidence of metastasis or local recurrence within the thorax.  CT ABDOMEN AND PELVIS  Findings:  There are multiple peritoneal nodules some which are slightly more prominent than prior.  For example nodule adjacent transverse colon measuring 5 mm (image 63)  compared to approximately 3 mm on prior.  Nodule adjacent to the splenic flexure of the  transverse colon measures 9 mm compared to approximately 6 mm on prior.  Nodule adjacent to the right hepatic lobe measures 7 mm (image 69) not changed 7 mm on prior.  Small celiac lymph node measuring 5 mm is stable.  Central mesentery  11 mm lymph node (image 69 ) may be increased from 10 mm on prior.  No focal hepatic lesion.  The gallbladder, pancreas, spleen, adrenal glands and kidneys are normal.  The stomach demonstrates diffuse wall thickening which may relate to nondistension.  The bowel obstruction.  Moderate volume stool throughout the colon without obstructing lesion.  Abdominal aorta normal caliber.  No retroperitoneal periportal lymphadenopathy.  No free fluid the pelvis.  Prostate gland bladder normal.  No pelvic lymphadenopathy. Review of  bone windows demonstrates no aggressive osseous lesions.  IMPRESSION:  1.  Mild progression of nodular implants within the peritoneal space associated with the transverse colon and along the liver margin. 2.  Small central mesenteric lymph nodes are stable to slightly increased. 3.  Moderate volume stool without obstructing lesion.  Original Report Authenticated By: Genevive Bi, M.D.    Impression and Plan: #1. 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 I personally reviewed current CT scans. He has multiple tiny subcentimeter peritoneal nodules. Although scan read as slight progression by the radiologist, I am not impressed that any lesion has grown more than 25% and there are still no lesions larger than 9 mm. His CEA remains in the normal range. He is tolerating the Avastin and Xeloda. I am going to continue the current regimen until there are clear signs of progression.  #2. Status post resection of a stage I non-small cell cancer left upper lung with postop brachitherapy June 2007.  #3. Vague, alveolar density right lower lung felt to be a second primary bronchioloalveolar lung cancer found at the same time as the initial cancer in the left upper lung no longer visible on current CT and likely represents therapeutic effect of the Avastin.  #4. Oxaliplatinum related neuropathy  #5. Type 2 diabetes  #6. History of nephrolithiasis  #7. Status post bilateral corneal  transplants   CC:. Dr. Rodrigo Ran; Dr. Forde Radon, MD 8/16/20136:49 PM

## 2012-08-09 ENCOUNTER — Telehealth: Payer: Self-pay | Admitting: *Deleted

## 2012-08-09 NOTE — Telephone Encounter (Signed)
Per staff message and POF I have schedueld appts.  JMW  

## 2012-08-11 ENCOUNTER — Telehealth: Payer: Self-pay | Admitting: Oncology

## 2012-08-11 NOTE — Telephone Encounter (Signed)
Talked to pt, he is aware of appt and he will pick up oral contrast for CT on 08/13/12

## 2012-08-11 NOTE — Telephone Encounter (Signed)
Talked to pt , gave him appt for CT, lab and chemo, he wants to r/s CT and gave him number to radiology scheduling, also left Marcelino Duster a message to change appt for later in the day

## 2012-08-13 ENCOUNTER — Other Ambulatory Visit: Payer: Medicare Other | Admitting: Lab

## 2012-08-13 ENCOUNTER — Telehealth: Payer: Self-pay | Admitting: Oncology

## 2012-08-13 ENCOUNTER — Other Ambulatory Visit: Payer: Self-pay | Admitting: *Deleted

## 2012-08-13 ENCOUNTER — Other Ambulatory Visit (HOSPITAL_BASED_OUTPATIENT_CLINIC_OR_DEPARTMENT_OTHER): Payer: Medicare Other | Admitting: Lab

## 2012-08-13 ENCOUNTER — Ambulatory Visit: Payer: Medicare Other

## 2012-08-13 ENCOUNTER — Ambulatory Visit (HOSPITAL_BASED_OUTPATIENT_CLINIC_OR_DEPARTMENT_OTHER): Payer: Medicare Other

## 2012-08-13 VITALS — BP 111/67 | HR 75 | Temp 97.1°F | Resp 18

## 2012-08-13 DIAGNOSIS — C3410 Malignant neoplasm of upper lobe, unspecified bronchus or lung: Secondary | ICD-10-CM

## 2012-08-13 DIAGNOSIS — C7951 Secondary malignant neoplasm of bone: Secondary | ICD-10-CM

## 2012-08-13 DIAGNOSIS — C189 Malignant neoplasm of colon, unspecified: Secondary | ICD-10-CM

## 2012-08-13 DIAGNOSIS — C185 Malignant neoplasm of splenic flexure: Secondary | ICD-10-CM

## 2012-08-13 DIAGNOSIS — Z5112 Encounter for antineoplastic immunotherapy: Secondary | ICD-10-CM

## 2012-08-13 DIAGNOSIS — C3490 Malignant neoplasm of unspecified part of unspecified bronchus or lung: Secondary | ICD-10-CM

## 2012-08-13 DIAGNOSIS — Z5111 Encounter for antineoplastic chemotherapy: Secondary | ICD-10-CM

## 2012-08-13 LAB — CBC WITH DIFFERENTIAL/PLATELET
BASO%: 0.4 % (ref 0.0–2.0)
EOS%: 5.5 % (ref 0.0–7.0)
MCH: 32.6 pg (ref 27.2–33.4)
MCHC: 35 g/dL (ref 32.0–36.0)
MONO#: 0.4 10*3/uL (ref 0.1–0.9)
RBC: 4.48 10*6/uL (ref 4.20–5.82)
RDW: 17.6 % — ABNORMAL HIGH (ref 11.0–14.6)
WBC: 7.6 10*3/uL (ref 4.0–10.3)
lymph#: 1.7 10*3/uL (ref 0.9–3.3)
nRBC: 0 % (ref 0–0)

## 2012-08-13 LAB — COMPREHENSIVE METABOLIC PANEL
ALT: 11 U/L (ref 0–53)
AST: 16 U/L (ref 0–37)
Albumin: 3.5 g/dL (ref 3.5–5.2)
Alkaline Phosphatase: 42 U/L (ref 39–117)
BUN: 14 mg/dL (ref 6–23)
Calcium: 10.1 mg/dL (ref 8.4–10.5)
Chloride: 105 mEq/L (ref 96–112)
Potassium: 4.3 mEq/L (ref 3.5–5.3)
Sodium: 138 mEq/L (ref 135–145)
Total Protein: 6.6 g/dL (ref 6.0–8.3)

## 2012-08-13 LAB — UA PROTEIN, DIPSTICK - CHCC: Protein, ur: NEGATIVE mg/dL

## 2012-08-13 MED ORDER — BEVACIZUMAB CHEMO INJECTION 400 MG/16ML
4.7000 mg/kg | Freq: Once | INTRAVENOUS | Status: AC
  Administered 2012-08-13: 400 mg via INTRAVENOUS
  Filled 2012-08-13: qty 16

## 2012-08-13 MED ORDER — SODIUM CHLORIDE 0.9 % IV SOLN
Freq: Once | INTRAVENOUS | Status: AC
  Administered 2012-08-13: 16:00:00 via INTRAVENOUS

## 2012-08-13 NOTE — Patient Instructions (Signed)
[facility] Discharge Instructions for Patients Receiving Chemotherapy  Today you received the following chemotherapy agents Avastin  To help prevent nausea and vomiting after your treatment, we encourage you to take your nausea medication  Begin taking it at 7 pm and take it as often as prescribed for the next 24 to 72 hours.   If you develop nausea and vomiting that is not controlled by your nausea medication, call the clinic. If it is after clinic hours your family physician or the after hours number for the clinic or go to the Emergency Department.   BELOW ARE SYMPTOMS THAT SHOULD BE REPORTED IMMEDIATELY:  *FEVER GREATER THAN 100.5 F  *CHILLS WITH OR WITHOUT FEVER  NAUSEA AND VOMITING THAT IS NOT CONTROLLED WITH YOUR NAUSEA MEDICATION  *UNUSUAL SHORTNESS OF BREATH  *UNUSUAL BRUISING OR BLEEDING  TENDERNESS IN MOUTH AND THROAT WITH OR WITHOUT PRESENCE OF ULCERS  *URINARY PROBLEMS  *BOWEL PROBLEMS  UNUSUAL RASH Items with * indicate a potential emergency and should be followed up as soon as possible.  One of the nurses will contact you 24 hours after your treatment. Please let the nurse know about any problems that you may have experienced. Feel free to call the clinic you have any questions or concerns. The clinic phone number is [phone].   I have been informed and understand all the instructions given to me. I know to contact the clinic, my physician, or go to the Emergency Department if any problems should occur. I do not have any questions at this time, but understand that I may call the clinic during office hours   should I have any questions or need assistance in obtaining follow up care.    __________________________________________  _____________  __________ Signature of Patient or Authorized Representative            Date                   Time    __________________________________________ Nurse's Signature    

## 2012-08-13 NOTE — Telephone Encounter (Signed)
Gave pt oral contrast for CT, NPO 4 hours prior to CT, also gave pt appt calendar for September 2013 lab, chemo and ML

## 2012-08-14 ENCOUNTER — Encounter: Payer: Self-pay | Admitting: *Deleted

## 2012-08-14 NOTE — Progress Notes (Signed)
RECEIVED A FAX FROM [employer] CONCERNING A CONFIRMATION OF PRESCRIPTION SHIPMENT FOR XELODA ON 08/12/12.

## 2012-08-19 ENCOUNTER — Ambulatory Visit: Payer: Medicare Other | Admitting: Oncology

## 2012-08-21 ENCOUNTER — Other Ambulatory Visit: Payer: Self-pay | Admitting: Orthopedic Surgery

## 2012-08-27 ENCOUNTER — Encounter (HOSPITAL_BASED_OUTPATIENT_CLINIC_OR_DEPARTMENT_OTHER): Payer: Self-pay | Admitting: *Deleted

## 2012-08-27 ENCOUNTER — Other Ambulatory Visit (HOSPITAL_BASED_OUTPATIENT_CLINIC_OR_DEPARTMENT_OTHER): Payer: Medicare Other | Admitting: Lab

## 2012-08-27 ENCOUNTER — Ambulatory Visit (HOSPITAL_BASED_OUTPATIENT_CLINIC_OR_DEPARTMENT_OTHER): Payer: Medicare Other

## 2012-08-27 VITALS — BP 116/65 | HR 76 | Temp 97.8°F

## 2012-08-27 DIAGNOSIS — C189 Malignant neoplasm of colon, unspecified: Secondary | ICD-10-CM

## 2012-08-27 DIAGNOSIS — Z5111 Encounter for antineoplastic chemotherapy: Secondary | ICD-10-CM

## 2012-08-27 DIAGNOSIS — C3410 Malignant neoplasm of upper lobe, unspecified bronchus or lung: Secondary | ICD-10-CM

## 2012-08-27 DIAGNOSIS — C185 Malignant neoplasm of splenic flexure: Secondary | ICD-10-CM

## 2012-08-27 DIAGNOSIS — Z5112 Encounter for antineoplastic immunotherapy: Secondary | ICD-10-CM

## 2012-08-27 LAB — CBC WITH DIFFERENTIAL/PLATELET
Basophils Absolute: 0 10*3/uL (ref 0.0–0.1)
Eosinophils Absolute: 0.5 10*3/uL (ref 0.0–0.5)
HCT: 41.1 % (ref 38.4–49.9)
HGB: 14.6 g/dL (ref 13.0–17.1)
MCV: 91.5 fL (ref 79.3–98.0)
MONO%: 7.2 % (ref 0.0–14.0)
NEUT#: 4.8 10*3/uL (ref 1.5–6.5)
NEUT%: 64.9 % (ref 39.0–75.0)
RDW: 18.2 % — ABNORMAL HIGH (ref 11.0–14.6)

## 2012-08-27 MED ORDER — SODIUM CHLORIDE 0.9 % IV SOLN
Freq: Once | INTRAVENOUS | Status: AC
  Administered 2012-08-27: 16:00:00 via INTRAVENOUS

## 2012-08-27 MED ORDER — SODIUM CHLORIDE 0.9 % IV SOLN
4.7000 mg/kg | Freq: Once | INTRAVENOUS | Status: AC
  Administered 2012-08-27: 400 mg via INTRAVENOUS
  Filled 2012-08-27: qty 16

## 2012-08-27 NOTE — Patient Instructions (Signed)
[facility] Discharge Instructions for Patients Receiving Chemotherapy  Today you received the following chemotherapy agents Avastin To help prevent nausea and vomiting after your treatment, we encourage you to take your nausea medication as prescribed.  If you develop nausea and vomiting that is not controlled by your nausea medication, call the clinic. If it is after clinic hours your family physician or the after hours number for the clinic or go to the Emergency Department.   BELOW ARE SYMPTOMS THAT SHOULD BE REPORTED IMMEDIATELY:  *FEVER GREATER THAN 100.5 F  *CHILLS WITH OR WITHOUT FEVER  NAUSEA AND VOMITING THAT IS NOT CONTROLLED WITH YOUR NAUSEA MEDICATION  *UNUSUAL SHORTNESS OF BREATH  *UNUSUAL BRUISING OR BLEEDING  TENDERNESS IN MOUTH AND THROAT WITH OR WITHOUT PRESENCE OF ULCERS  *URINARY PROBLEMS  *BOWEL PROBLEMS  UNUSUAL RASH Items with * indicate a potential emergency and should be followed up as soon as possible.  One of the nurses will contact you 24 hours after your treatment. Please let the nurse know about any problems that you may have experienced. Feel free to call the clinic you have any questions or concerns. The clinic phone number is [phone].   I have been informed and understand all the instructions given to me. I know to contact the clinic, my physician, or go to the Emergency Department if any problems should occur. I do not have any questions at this time, but understand that I may call the clinic during office hours   should I have any questions or need assistance in obtaining follow up care.    __________________________________________  _____________  __________ Signature of Patient or Authorized Representative            Date                   Time    __________________________________________ Nurse's Signature    

## 2012-08-27 NOTE — Progress Notes (Signed)
Pre-Procedure: Pt states oncology recommended calling Dr. Stark Jock office to ensure Dr. Teressa Senter was aware of pt's Avastatin treatment taken today. Recommended to pt that he inform Dr. Teressa Senter that he takes ASA 81 mg.

## 2012-08-28 ENCOUNTER — Encounter (HOSPITAL_BASED_OUTPATIENT_CLINIC_OR_DEPARTMENT_OTHER): Payer: Self-pay | Admitting: *Deleted

## 2012-08-28 ENCOUNTER — Encounter (HOSPITAL_BASED_OUTPATIENT_CLINIC_OR_DEPARTMENT_OTHER)
Admission: RE | Admit: 2012-08-28 | Discharge: 2012-08-28 | Disposition: A | Discharge status: Home / Self Care | Payer: Medicare Other | Source: Ambulatory Visit | Attending: Orthopedic Surgery | Admitting: Orthopedic Surgery

## 2012-08-28 ENCOUNTER — Other Ambulatory Visit: Payer: Self-pay

## 2012-08-28 LAB — BASIC METABOLIC PANEL
BUN: 10 mg/dL (ref 6–23)
Chloride: 104 mEq/L (ref 96–112)
Creatinine, Ser: 1.01 mg/dL (ref 0.50–1.35)
Glucose, Bld: 106 mg/dL — ABNORMAL HIGH (ref 70–99)
Potassium: 4.3 mEq/L (ref 3.5–5.1)

## 2012-08-28 NOTE — H&P (Signed)
[name] is an 71 y.o. male.   Chief Complaint: C/o chronic and progressive STS symptoms left long finger. HPI: .  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  He has had triggering of his left long finger dating back to the first week in November. His past history is complex.  He is 5'7" tall and weighs 187 pounds.  He has had multiple cancer episodes and is under the care of Riley Churches at the [facility].  He had lung cancer diagnosed in 2007 with lung surgery. He subsequently had colon cancer treated in 2012.  He has had visual impairment, he has had corneal transplants.   After informed consent during which we discussed transient elevation of blood glucose he is injected with Depo Medrol and Lidocaine into his left long finger flexor sheath.  This was well tolerated.    [name] returned in mid-August for follow-up examination of his locking left long finger.  He had complete relief following injection in mid-December 2012.  After several months he developed recurrent triggering. He now has 10 degree flexion contracture of the long finger PIP joint.  He does not show signs of significant stenosing tenosynovitis of his thumb or other fingers, left or right.   I have recommended that [name] proceed with release of the A-1 pulley under local anesthesia and sedation.  The surgery and aftercare were described in detail.    Past Medical History  Diagnosis Date  . COPD (chronic obstructive pulmonary disease)   . Nephrolithiasis   . Diverticulosis   . Hx of adenomatous colonic polyps 04/30/07  . Glaucoma   . Hyperlipidemia   . ED (erectile dysfunction)   . Lung nodule   . Hepatic steatosis   . Thoracic spondylosis   . Non-small cell lung cancer   . SOB (shortness of breath)   . Bruises easily   . Hearing loss     slight  . Contact lens/glasses fitting   . Rectal bleeding   . Hemorrhoids   . Glaucoma   . 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 Colon cancer   .  Diabetes mellitus     type II  . Anemia     Past Surgical History  Procedure Date  . 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 Lung removal, partial 05/2006    Lt upper lobe wedge resection  . Tonsillectomy   . Kidney stone removal 07/2006  . Colonoscopy   . Polypectomy   . Colon surgery 06/2011  . Corneal transplant 7-8 yrs ago    X 3; Lt eye    Family History  Problem Relation Age of Onset  . Asthma      aunt  . Asthma Cousin   . Emphysema Father   . Leukemia Father   . Lung cancer Father   . Ulcers Brother     of the stomach  . Esophageal cancer Mother   . Cancer Paternal Aunt     breast  . Cancer Daughter    Social History:  reports that he quit smoking about 31 years ago. His smoking use included Cigarettes. He has a 50 pack-year smoking history. He has never used smokeless tobacco. He reports that he drinks about .6 ounces of alcohol per week. He reports that he does not use illicit drugs.  Allergies: No Known Allergies  No prescriptions prior to admission    Results for orders placed in visit on 08/27/12 (from  the past 48 hour(s))  UA PROTEIN, DIPSTICK - [facility]     Status: Normal   Collection Time   08/27/12  3:32 PM      Component Value Range Comment   Protein, ur < 30  Negative- <30 mg/dL   CBC WITH DIFFERENTIAL     Status: Abnormal   Collection Time   08/27/12  3:32 PM      Component Value Range Comment   WBC 7.4  4.0 - 10.3 10e3/uL    NEUT# 4.8  1.5 - 6.5 10e3/uL    HGB 14.6  13.0 - 17.1 g/dL    HCT 11.9  14.7 - 82.9 %    Platelets 138 (*) 140 - 400 10e3/uL    MCV 91.5  79.3 - 98.0 fL    MCH 32.5  27.2 - 33.4 pg    MCHC 35.5  32.0 - 36.0 g/dL    RBC 5.62  1.30 - 8.65 10e6/uL    RDW 18.2 (*) 11.0 - 14.6 %    lymph# 1.6  0.9 - 3.3 10e3/uL    MONO# 0.5  0.1 - 0.9 10e3/uL    Eosinophils Absolute 0.5  0.0 - 0.5 10e3/uL    Basophils Absolute 0.0  0.0 - 0.1 10e3/uL    NEUT% 64.9  39.0 - 75.0 %    LYMPH% 21.4  14.0 - 49.0 %    MONO% 7.2  0.0 -  14.0 %    EOS% 6.1  0.0 - 7.0 %    BASO% 0.4  0.0 - 2.0 %     No results found.   Pertinent items are noted in HPI.  Height 5\' 7"  (1.702 m), weight 75.751 kg (167 lb).  General appearance: alert Head: Normocephalic, without obvious abnormality Neck: supple, symmetrical, trachea midline Resp: clear to auscultation bilaterally Cardio: regular rate and rhythm GI: normal findings: bowel sounds normal Extremities:  Inspection of his hand reveals no swelling or rubor. He has a 10 degree flexion contracture of his left long finger PIP joint vs. hyperextension of the right long finger PIP joint of 3 degrees.  He has active further flexion bringing all fingertips to the palm except the left long finger which lacks contact by one centimeter due to less than full flexion with locking of his stenosing tenosynovitis.  He is tender over the A-1 pulley.  He has stigmata of osteoarthritis including mild Heberden's nodes.  Pulse and cap refill are intact.   X-ray of his finger, four views, demonstrates mild narrowing at the PIP joint. He has squaring of the condyles of his proximal phalangeal head.    Pulses: 2+ and symmetric Skin: normal Neurologic: Grossly normal    Assessment/Plan Impression:Chronic STS left long finger  Plan: To the OR for release A-1 pulley left long finger.The procedure, risks,benefits and post-op course were discussed with the patient at length and they were in agreement with the plan.   DASNOIT,[name] 08/28/2012, 4:20 PM    H&P documentation: 08/29/2012  -History and Physical Reviewed  -Patient has been re-examined  -No change in the plan of care  Sean Forster, MD

## 2012-08-29 ENCOUNTER — Encounter (HOSPITAL_BASED_OUTPATIENT_CLINIC_OR_DEPARTMENT_OTHER): Payer: Self-pay | Admitting: Anesthesiology

## 2012-08-29 ENCOUNTER — Ambulatory Visit (HOSPITAL_BASED_OUTPATIENT_CLINIC_OR_DEPARTMENT_OTHER): Payer: Medicare Other | Admitting: Anesthesiology

## 2012-08-29 ENCOUNTER — Encounter (HOSPITAL_BASED_OUTPATIENT_CLINIC_OR_DEPARTMENT_OTHER)
Admission: RE | Disposition: A | Discharge status: Home / Self Care | Payer: Self-pay | Source: Ambulatory Visit | Attending: Orthopedic Surgery

## 2012-08-29 ENCOUNTER — Encounter (HOSPITAL_BASED_OUTPATIENT_CLINIC_OR_DEPARTMENT_OTHER): Payer: Self-pay | Admitting: Certified Registered"

## 2012-08-29 ENCOUNTER — Encounter (HOSPITAL_BASED_OUTPATIENT_CLINIC_OR_DEPARTMENT_OTHER): Payer: Self-pay | Admitting: *Deleted

## 2012-08-29 ENCOUNTER — Ambulatory Visit (HOSPITAL_BASED_OUTPATIENT_CLINIC_OR_DEPARTMENT_OTHER)
Admission: RE | Admit: 2012-08-29 | Discharge: 2012-08-29 | Disposition: A | Discharge status: Home / Self Care | Payer: Medicare Other | Source: Ambulatory Visit | Attending: Orthopedic Surgery | Admitting: Orthopedic Surgery

## 2012-08-29 DIAGNOSIS — M659 Unspecified synovitis and tenosynovitis, unspecified site: Secondary | ICD-10-CM | POA: Insufficient documentation

## 2012-08-29 DIAGNOSIS — Z85038 Personal history of other malignant neoplasm of large intestine: Secondary | ICD-10-CM | POA: Insufficient documentation

## 2012-08-29 DIAGNOSIS — J449 Chronic obstructive pulmonary disease, unspecified: Secondary | ICD-10-CM | POA: Insufficient documentation

## 2012-08-29 DIAGNOSIS — M47814 Spondylosis without myelopathy or radiculopathy, thoracic region: Secondary | ICD-10-CM | POA: Insufficient documentation

## 2012-08-29 DIAGNOSIS — E119 Type 2 diabetes mellitus without complications: Secondary | ICD-10-CM | POA: Insufficient documentation

## 2012-08-29 DIAGNOSIS — Z87442 Personal history of urinary calculi: Secondary | ICD-10-CM | POA: Insufficient documentation

## 2012-08-29 DIAGNOSIS — E785 Hyperlipidemia, unspecified: Secondary | ICD-10-CM | POA: Insufficient documentation

## 2012-08-29 DIAGNOSIS — H409 Unspecified glaucoma: Secondary | ICD-10-CM | POA: Insufficient documentation

## 2012-08-29 DIAGNOSIS — M6530 Trigger finger, unspecified finger: Secondary | ICD-10-CM | POA: Insufficient documentation

## 2012-08-29 DIAGNOSIS — J4489 Other specified chronic obstructive pulmonary disease: Secondary | ICD-10-CM | POA: Insufficient documentation

## 2012-08-29 DIAGNOSIS — Z85118 Personal history of other malignant neoplasm of bronchus and lung: Secondary | ICD-10-CM | POA: Insufficient documentation

## 2012-08-29 HISTORY — PX: TRIGGER FINGER RELEASE: SHX641

## 2012-08-29 LAB — POCT HEMOGLOBIN-HEMACUE: Hemoglobin: 15.1 g/dL (ref 13.0–17.0)

## 2012-08-29 SURGERY — RELEASE, A1 PULLEY, FOR TRIGGER FINGER
Anesthesia: Monitor Anesthesia Care | Site: Hand | Laterality: Left | Wound class: Clean

## 2012-08-29 MED ORDER — ONDANSETRON HCL 4 MG/2ML IJ SOLN
INTRAMUSCULAR | Status: DC | PRN
  Administered 2012-08-29: 4 mg via INTRAVENOUS

## 2012-08-29 MED ORDER — FENTANYL CITRATE 0.05 MG/ML IJ SOLN: 50.0000 ug | INTRAMUSCULAR | Status: DC | PRN

## 2012-08-29 MED ORDER — LIDOCAINE HCL (CARDIAC) 20 MG/ML IV SOLN
INTRAVENOUS | Status: DC | PRN
  Administered 2012-08-29: 40 mg via INTRAVENOUS

## 2012-08-29 MED ORDER — PROMETHAZINE HCL 25 MG/ML IJ SOLN: 6.2500 mg | INTRAMUSCULAR | Status: DC | PRN

## 2012-08-29 MED ORDER — CHLORHEXIDINE GLUCONATE 4 % EX LIQD: 60.0000 mL | Freq: Once | CUTANEOUS | Status: DC

## 2012-08-29 MED ORDER — PROPOFOL 10 MG/ML IV EMUL
INTRAVENOUS | Status: DC | PRN
  Administered 2012-08-29: 50 ug/kg/min via INTRAVENOUS

## 2012-08-29 MED ORDER — DEXAMETHASONE SODIUM PHOSPHATE 10 MG/ML IJ SOLN
INTRAMUSCULAR | Status: DC | PRN
  Administered 2012-08-29: 10 mg via INTRAVENOUS

## 2012-08-29 MED ORDER — SODIUM CHLORIDE 0.9 % IV SOLN
INTRAVENOUS | Status: DC
  Administered 2012-08-29: 10:00:00 via INTRAVENOUS

## 2012-08-29 MED ORDER — LACTATED RINGERS IV SOLN
INTRAVENOUS | Status: DC | PRN
  Administered 2012-08-29: 11:00:00 via INTRAVENOUS

## 2012-08-29 MED ORDER — MIDAZOLAM HCL 2 MG/2ML IJ SOLN: 1.0000 mg | INTRAMUSCULAR | Status: DC | PRN

## 2012-08-29 MED ORDER — FENTANYL CITRATE 0.05 MG/ML IJ SOLN
INTRAMUSCULAR | Status: DC | PRN
  Administered 2012-08-29: 50 ug via INTRAVENOUS

## 2012-08-29 MED ORDER — TRAMADOL HCL 50 MG PO TABS: ORAL_TABLET | ORAL | Status: AC

## 2012-08-29 MED ORDER — MIDAZOLAM HCL 5 MG/5ML IJ SOLN
INTRAMUSCULAR | Status: DC | PRN
  Administered 2012-08-29: 1 mg via INTRAVENOUS

## 2012-08-29 MED ORDER — LIDOCAINE HCL 2 % IJ SOLN
INTRAMUSCULAR | Status: DC | PRN
  Administered 2012-08-29: 3 mL

## 2012-08-29 MED ORDER — FENTANYL CITRATE 0.05 MG/ML IJ SOLN: 25.0000 ug | INTRAMUSCULAR | Status: DC | PRN

## 2012-08-29 SURGICAL SUPPLY — 39 items
BLADE SURG 15 STRL LF DISP TIS (BLADE) ×1 IMPLANT
BLADE SURG 15 STRL SS (BLADE) ×2
BNDG CMPR 9X4 STRL LF SNTH (GAUZE/BANDAGES/DRESSINGS) ×1
BNDG CMPR MD 5X2 ELC HKLP STRL (GAUZE/BANDAGES/DRESSINGS) ×1
BNDG ELASTIC 2 VLCR STRL LF (GAUZE/BANDAGES/DRESSINGS) ×2 IMPLANT
BNDG ESMARK 4X9 LF (GAUZE/BANDAGES/DRESSINGS) ×1 IMPLANT
BRUSH SCRUB EZ PLAIN DRY (MISCELLANEOUS) ×2 IMPLANT
CLOTH BEACON ORANGE TIMEOUT ST (SAFETY) ×2 IMPLANT
CORDS BIPOLAR (ELECTRODE) ×1 IMPLANT
COVER MAYO STAND STRL (DRAPES) ×2 IMPLANT
COVER TABLE BACK 60X90 (DRAPES) ×2 IMPLANT
CUFF TOURNIQUET SINGLE 18IN (TOURNIQUET CUFF) ×2 IMPLANT
DECANTER SPIKE VIAL GLASS SM (MISCELLANEOUS) IMPLANT
DRAPE EXTREMITY T 121X128X90 (DRAPE) ×2 IMPLANT
DRAPE SURG 17X23 STRL (DRAPES) ×2 IMPLANT
GAUZE SPONGE 4X4 12PLY STRL LF (GAUZE/BANDAGES/DRESSINGS) IMPLANT
GAUZE XEROFORM 1X8 LF (GAUZE/BANDAGES/DRESSINGS) IMPLANT
GLOVE BIO SURGEON STRL SZ 6.5 (GLOVE) ×2 IMPLANT
GLOVE BIOGEL M STRL SZ7.5 (GLOVE) ×2 IMPLANT
GLOVE BIOGEL PI IND STRL 7.0 (GLOVE) ×1 IMPLANT
GLOVE BIOGEL PI INDICATOR 7.0 (GLOVE) ×1
GLOVE EXAM NITRILE PF MED BLUE (GLOVE) ×2 IMPLANT
GLOVE ORTHO TXT STRL SZ7.5 (GLOVE) ×2 IMPLANT
GOWN PREVENTION PLUS XLARGE (GOWN DISPOSABLE) ×2 IMPLANT
GOWN STRL REIN XL XLG (GOWN DISPOSABLE) ×4 IMPLANT
NDL SAFETY ECLIPSE 18X1.5 (NEEDLE) IMPLANT
NEEDLE 27GAX1X1/2 (NEEDLE) ×2 IMPLANT
NEEDLE HYPO 18GX1.5 SHARP (NEEDLE) ×2
PACK BASIN DAY SURGERY FS (CUSTOM PROCEDURE TRAY) ×2 IMPLANT
PAD CAST 4YDX4 CTTN HI CHSV (CAST SUPPLIES) ×1 IMPLANT
PADDING CAST COTTON 4X4 STRL (CAST SUPPLIES) ×2
SPONGE GAUZE 4X4 12PLY (GAUZE/BANDAGES/DRESSINGS) ×1 IMPLANT
STOCKINETTE 4X48 STRL (DRAPES) ×2 IMPLANT
STRIP CLOSURE SKIN 1/2X4 (GAUZE/BANDAGES/DRESSINGS) ×1 IMPLANT
SUT PROLENE 4 0 P 3 18 (SUTURE) ×1 IMPLANT
SYR CONTROL 10ML LL (SYRINGE) ×2 IMPLANT
TOWEL OR 17X24 6PK STRL BLUE (TOWEL DISPOSABLE) ×2 IMPLANT
UNDERPAD 30X30 INCONTINENT (UNDERPADS AND DIAPERS) ×2 IMPLANT
WATER STERILE IRR 1000ML POUR (IV SOLUTION) IMPLANT

## 2012-08-29 NOTE — Op Note (Signed)
[id number] 

## 2012-08-29 NOTE — Brief Op Note (Signed)
08/29/2012  11:42 AM  PATIENT:  [name]  71 y.o. male  PRE-OPERATIVE DIAGNOSIS:  trigger finger left long finger  POST-OPERATIVE DIAGNOSIS:  trigger finger left long finger  PROCEDURE:  Procedure(s) (LRB) with comments: RELEASE TRIGGER FINGER/A-1 PULLEY (Left) - release a1 pulley left long  SURGEON:  Surgeon(s) and Role:    * Wyn Forster., MD - Primary  PHYSICIAN ASSISTANT:   ASSISTANTS:Kmarion Rawl Dasnoit,P.A-C   ANESTHESIA:   MAC  EBL:     BLOOD ADMINISTERED:none  DRAINS: none   LOCAL MEDICATIONS USED:  XYLOCAINE   SPECIMEN:  No Specimen  DISPOSITION OF SPECIMEN:  N/A  COUNTS:  YES  TOURNIQUET:  * Missing tourniquet times found for documented tourniquets in log:  [id number] *  DICTATION: .Other Dictation: Dictation Number [id number]  PLAN OF CARE: Discharge to home after PACU  PATIENT DISPOSITION:  PACU - hemodynamically stable.

## 2012-08-29 NOTE — Anesthesia Postprocedure Evaluation (Signed)
  Anesthesia Post-op Note  Patient: [name]  Procedure(s) Performed: Procedure(s) (LRB) with comments: RELEASE TRIGGER FINGER/A-1 PULLEY (Left) - release a1 pulley left long  Patient Location: PACU  Anesthesia Type: MAC  Level of Consciousness: awake  Airway and Oxygen Therapy: Patient Spontanous Breathing  Post-op Pain: mild   Post-op Assessment: Post-op Vital signs reviewed, Patient's Cardiovascular Status Stable, Respiratory Function Stable, Patent Airway, No signs of Nausea or vomiting and Pain level controlled  Post-op Vital Signs: stable  Complications: No apparent anesthesia complications

## 2012-08-29 NOTE — Anesthesia Procedure Notes (Signed)
Procedure Name: MAC Performed by: Gabriele Zwilling W Pre-anesthesia Checklist: Patient identified, Timeout performed, Emergency Drugs available, Suction available and Patient being monitored Patient Re-evaluated:Patient Re-evaluated prior to induction Oxygen Delivery Method: Simple face mask       

## 2012-08-29 NOTE — Transfer of Care (Signed)
Immediate Anesthesia Transfer of Care Note  Patient: [name]  Procedure(s) Performed: Procedure(s) (LRB) with comments: RELEASE TRIGGER FINGER/A-1 PULLEY (Left) - release a1 pulley left long  Patient Location: PACU  Anesthesia Type: MAC  Level of Consciousness: awake, alert , oriented and patient cooperative  Airway & Oxygen Therapy: Patient Spontanous Breathing and Patient connected to face mask oxygen  Post-op Assessment: Report given to PACU RN and Post -op Vital signs reviewed and stable  Post vital signs: Reviewed and stable  Complications: No apparent anesthesia complications

## 2012-08-29 NOTE — Discharge Instructions (Signed)

## 2012-08-29 NOTE — Anesthesia Preprocedure Evaluation (Signed)
Anesthesia Evaluation  Patient identified by MRN, date of birth, ID band Patient awake    Reviewed: Allergy & Precautions, H&P , NPO status , Patient's Chart, lab work & pertinent test results  Airway Mallampati: I TM Distance: >3 FB Neck ROM: Full    Dental   Pulmonary shortness of breath, COPD Lung ca + rhonchi         Cardiovascular Rhythm:Regular Rate:Normal     Neuro/Psych    GI/Hepatic H/o colon ca   Endo/Other  diabetes  Renal/GU      Musculoskeletal   Abdominal   Peds  Hematology   Anesthesia Other Findings   Reproductive/Obstetrics                           Anesthesia Physical Anesthesia Plan  ASA: III  Anesthesia Plan: MAC   Post-op Pain Management:    Induction: Intravenous  Airway Management Planned: Simple Face Mask  Additional Equipment:   Intra-op Plan:   Post-operative Plan:   Informed Consent: I have reviewed the patients History and Physical, chart, labs and discussed the procedure including the risks, benefits and alternatives for the proposed anesthesia with the patient or authorized representative who has indicated his/her understanding and acceptance.     Plan Discussed with: CRNA and Surgeon  Anesthesia Plan Comments:         Anesthesia Quick Evaluation

## 2012-09-01 ENCOUNTER — Encounter (HOSPITAL_BASED_OUTPATIENT_CLINIC_OR_DEPARTMENT_OTHER): Payer: Self-pay | Admitting: Orthopedic Surgery

## 2012-09-01 NOTE — Op Note (Signed)
NAMERATHANA, VIVEROS NO.:  [id number]  MEDICAL RECORD NO.:  [id number]  LOCATION:                                 FACILITY:  PHYSICIAN:  Katy Fitch. Deetta Siegmann, M.D. 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  OPERATION:  Release of left long finger A1 pulley with synovectomy of superficialis profundus tendon.  OPERATING SURGEON:  Katy Fitch. Stephanee Barcomb, MD  ASSISTANT:  Jonni Sanger, PA-C  SUPERVISING ANESTHESIOLOGIST:  Bedelia Person, MD  INDICATIONS:  Laksh Hinners is a 71 year old gentleman referred through the courtesy of Dr. Rodrigo Ran for evaluation and management of chronically locked left long trigger finger.  Mr. Mcduffee has multiple background medical problems including diabetes and several cancer diagnoses.  He has had chronic stenosing tenosynovitis of his left long finger for months.  He developed a flexion contracture at the PIP joint.  He requested a Hand Surgery consult for correction of this predicament.  After informed consent, he is brought to the operating room at this time.  PROCEDURE:  Marquail Bradwell is brought to room #1 of the [facility] and placed in supine position on the operating table.  Following IV sedation, the left arm was prepped with Betadine soap and solution, sterilely draped.  A 2% lidocaine was infiltrated in the path intended incision and flexor sheath of the left long finger.  The left hand and arm were then prepped with  Betadine soap and solution, sterilely draped.  Following exsanguination of the left arm with Esmarch bandage, an arterial tourniquet on the proximal left brachium was inflated to 220 mmHg.  Following routine surgical time-out, procedure commenced with a short oblique incision in the distal palmar crease. Subcutaneous tissues were carefully divided releasing subcutaneous fibrotic tissues consistent with early Dupuytren's palmar fibromatosis. The A1 pulley was isolated and split with scalpel and scissors.  There was abundant fibrotic tenosynovium proximal to the A1 pulley.  This was meticulously removed from the superficialis profundus tendons by delivering the tendons with a Ragnell retractor and using scissors, and a microrongeur to remove the synovium.  After complete synovectomy, full extension at the PIP joint was recovered.  There were no apparent complications.  Mr. Mcbryar tolerated the procedure well.  The wound was then repaired with intradermal 4-0 Prolene.  A compressive dressing was applied with Steri-Strips, sterile gauze and Ace wrap.  We will encourage Mr. Bartoli to begin immediate range of motion exercises. I will see him back for followup in 1 week for suture removal.  For aftercare, he is provided prescription for Ultram 50 mg 1 p.o. q.4 hours p.r.n. pain, 20 tablets without refill.     Katy Fitch Iolani Twilley, M.D.     RVS/MEDQ  D:  08/29/2012  T:  08/30/2012  Job:  [id number]

## 2012-09-04 ENCOUNTER — Other Ambulatory Visit: Payer: Self-pay | Admitting: *Deleted

## 2012-09-04 NOTE — Telephone Encounter (Signed)
THIS REFILL REQUEST FOR XELODA WAS PLACED IN DR.GRANFORTUNA'S ACTIVE WORK BOX. 

## 2012-09-05 ENCOUNTER — Telehealth: Payer: Self-pay | Admitting: *Deleted

## 2012-09-05 NOTE — Telephone Encounter (Signed)
[facility] faxed prescription refill request for Xeloda.  Request to MD for review. 

## 2012-09-09 ENCOUNTER — Other Ambulatory Visit: Payer: Self-pay | Admitting: *Deleted

## 2012-09-09 DIAGNOSIS — C189 Malignant neoplasm of colon, unspecified: Secondary | ICD-10-CM

## 2012-09-09 MED ORDER — CAPECITABINE 500 MG PO TABS: 2000.0000 mg | ORAL_TABLET | Freq: Two times a day (BID) | ORAL | Status: DC

## 2012-09-09 NOTE — Telephone Encounter (Signed)
RECEIVED A FAX FROM [facility] CONCERNING A CONFIRMATION OF FACSIMILE RECEIPT FOR PT.'S REFERRAL. 

## 2012-09-10 ENCOUNTER — Ambulatory Visit (HOSPITAL_BASED_OUTPATIENT_CLINIC_OR_DEPARTMENT_OTHER): Payer: Medicare Other

## 2012-09-10 ENCOUNTER — Other Ambulatory Visit (HOSPITAL_BASED_OUTPATIENT_CLINIC_OR_DEPARTMENT_OTHER): Payer: Medicare Other

## 2012-09-10 ENCOUNTER — Other Ambulatory Visit: Payer: Medicare Other | Admitting: Lab

## 2012-09-10 VITALS — BP 126/68 | HR 80 | Temp 97.6°F | Resp 20

## 2012-09-10 DIAGNOSIS — Z5112 Encounter for antineoplastic immunotherapy: Secondary | ICD-10-CM

## 2012-09-10 DIAGNOSIS — C3410 Malignant neoplasm of upper lobe, unspecified bronchus or lung: Secondary | ICD-10-CM

## 2012-09-10 DIAGNOSIS — D649 Anemia, unspecified: Secondary | ICD-10-CM

## 2012-09-10 DIAGNOSIS — C185 Malignant neoplasm of splenic flexure: Secondary | ICD-10-CM

## 2012-09-10 DIAGNOSIS — C189 Malignant neoplasm of colon, unspecified: Secondary | ICD-10-CM

## 2012-09-10 LAB — CBC WITH DIFFERENTIAL/PLATELET
Basophils Absolute: 0 10*3/uL (ref 0.0–0.1)
Eosinophils Absolute: 0.5 10*3/uL (ref 0.0–0.5)
LYMPH%: 12.4 % — ABNORMAL LOW (ref 14.0–49.0)
MCV: 94.6 fL (ref 79.3–98.0)
MONO%: 6.5 % (ref 0.0–14.0)
NEUT#: 8.8 10*3/uL — ABNORMAL HIGH (ref 1.5–6.5)
Platelets: 168 10*3/uL (ref 140–400)
RBC: 4.62 10*6/uL (ref 4.20–5.82)
nRBC: 0 % (ref 0–0)

## 2012-09-10 LAB — COMPREHENSIVE METABOLIC PANEL (CC13)
CO2: 23 mEq/L (ref 22–29)
Creatinine: 1 mg/dL (ref 0.7–1.3)
Glucose: 120 mg/dl — ABNORMAL HIGH (ref 70–99)
Total Bilirubin: 1.3 mg/dL — ABNORMAL HIGH (ref 0.20–1.20)

## 2012-09-10 MED ORDER — SODIUM CHLORIDE 0.9 % IV SOLN
4.7000 mg/kg | Freq: Once | INTRAVENOUS | Status: AC
  Administered 2012-09-10: 400 mg via INTRAVENOUS
  Filled 2012-09-10: qty 16

## 2012-09-10 MED ORDER — SODIUM CHLORIDE 0.9 % IV SOLN
Freq: Once | INTRAVENOUS | Status: AC
  Administered 2012-09-10: 10:00:00 via INTRAVENOUS

## 2012-09-11 ENCOUNTER — Other Ambulatory Visit: Payer: Self-pay | Admitting: *Deleted

## 2012-09-11 NOTE — Telephone Encounter (Signed)
Xeloda shipped 09/10/12

## 2012-09-12 ENCOUNTER — Telehealth: Payer: Self-pay | Admitting: *Deleted

## 2012-09-12 ENCOUNTER — Ambulatory Visit (HOSPITAL_BASED_OUTPATIENT_CLINIC_OR_DEPARTMENT_OTHER): Payer: Medicare Other | Admitting: Nurse Practitioner

## 2012-09-12 ENCOUNTER — Telehealth: Payer: Self-pay | Admitting: Oncology

## 2012-09-12 VITALS — BP 124/69 | HR 79 | Temp 97.0°F | Resp 20 | Ht 67.0 in | Wt 169.9 lb

## 2012-09-12 DIAGNOSIS — C189 Malignant neoplasm of colon, unspecified: Secondary | ICD-10-CM

## 2012-09-12 DIAGNOSIS — C3410 Malignant neoplasm of upper lobe, unspecified bronchus or lung: Secondary | ICD-10-CM

## 2012-09-12 DIAGNOSIS — C786 Secondary malignant neoplasm of retroperitoneum and peritoneum: Secondary | ICD-10-CM

## 2012-09-12 DIAGNOSIS — C185 Malignant neoplasm of splenic flexure: Secondary | ICD-10-CM

## 2012-09-12 DIAGNOSIS — E119 Type 2 diabetes mellitus without complications: Secondary | ICD-10-CM

## 2012-09-12 NOTE — Telephone Encounter (Signed)
Per staff message and POF I have scheduled.  JMW  

## 2012-09-12 NOTE — Telephone Encounter (Signed)
Gave pt appt for October and September, 2013, emailed [place] regarding chemo, pt will come by and get another calendar

## 2012-09-12 NOTE — Progress Notes (Signed)
OFFICE PROGRESS NOTE  Interval history:  [name] is a 71 year old man with local intra-abdominal recurrence of colon cancer currently on active treatment with Xeloda 2000 mg twice daily 7 days on/7 days off and every 2 week Avastin. He is seen today for scheduled followup.  [name] reports that overall he feels well. He denies any nausea or vomiting. No mouth sores. He has occasional loose stools. No hand or foot pain or redness. He continues to note numbness in the hands and feet. The numbness does not interfere with activity. He denies bleeding. No shortness of breath or chest pain. No leg swelling or calf pain.   Objective: Blood pressure 124/69, pulse 79, temperature 97 F (36.1 C), temperature source Oral, resp. rate 20, height 5\' 7"  (1.702 m), weight 169 lb 14.4 oz (77.066 kg).  Oropharynx is without thrush or ulceration. No palpable cervical, supraclavicular or axillary lymph nodes. Lungs are clear. No wheezes or rales. Regular cardiac rhythm. Abdomen is soft and nontender. No hepatomegaly. Extremities are without edema. Calves are soft and nontender. Motor strength is 5 over 5. Vibratory sense is mildly decreased over the fingertips per tuning fork exam.  Lab Results: Lab Results  Component Value Date   WBC 11.5* 09/10/2012   HGB 14.9 09/10/2012   HCT 43.7 09/10/2012   MCV 94.6 09/10/2012   PLT 168 09/10/2012    Chemistry:    Chemistry      Component Value Date/Time   NA 139 09/10/2012 0857   NA 139 08/28/2012 1130   NA 139 10/15/2011 1601   K 4.2 09/10/2012 0857   K 4.3 08/28/2012 1130   K 4.3 10/15/2011 1601   CL 107 09/10/2012 0857   CL 104 08/28/2012 1130   CL 101 10/15/2011 1601   CO2 23 09/10/2012 0857   CO2 25 08/28/2012 1130   CO2 30 10/15/2011 1601   BUN 14.0 09/10/2012 0857   BUN 10 08/28/2012 1130   BUN 17 10/15/2011 1601   CREATININE 1.0 09/10/2012 0857   CREATININE 1.01 08/28/2012 1130   CREATININE 1.1 10/15/2011 1601      Component Value Date/Time   CALCIUM 9.9  09/10/2012 0857   CALCIUM 10.3 08/28/2012 1130   CALCIUM 9.3 10/15/2011 1601   ALKPHOS 60 09/10/2012 0857   ALKPHOS 42 08/13/2012 1455   ALKPHOS 59 10/15/2011 1601   AST 11 09/10/2012 0857   AST 16 08/13/2012 1455   AST 35 10/15/2011 1601   ALT 10 09/10/2012 0857   ALT 11 08/13/2012 1455   BILITOT 1.30* 09/10/2012 0857   BILITOT 0.9 08/13/2012 1455   BILITOT 1.30 10/15/2011 1601       Studies/Results: No results found.  Medications: I have reviewed the patient's current medications.  Assessment/Plan:  1. Colon cancer metastatic to peritoneum currently on active treatment with Xeloda 2000 mg twice daily 7 days on/7 days off and Avastin every 2 weeks. He appears to be tolerating treatment well. 2. Status post resection of a stage I non-small cell lung cancer left upper lung with postop brachytherapy June 2007. 3. 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 The density was no longer visible on recent restaging CT evaluation. 4. Oxaliplatin neuropathy. 5. Type 2 diabetes. 6. History of nephrolithiasis. 7. Status post bilateral corneal transplants.  Disposition-[name] appears stable. Plan to continue Xeloda/Avastin as above. The next restaging CT evaluation  is scheduled for 10/17/2012. He has a followup visit with Dr. Cyndie Chime on 10/24/2012 to review the results. He will contact the office in the interim with any problems.  Lonna Cobb ANP/GNP-BC

## 2012-09-15 ENCOUNTER — Telehealth: Payer: Self-pay | Admitting: Oncology

## 2012-09-15 NOTE — Telephone Encounter (Signed)
Called pt and left message regarding lab and infusion in October advised patient to get calendar

## 2012-09-18 ENCOUNTER — Other Ambulatory Visit: Payer: Self-pay | Admitting: Oncology

## 2012-09-24 ENCOUNTER — Telehealth: Payer: Self-pay | Admitting: Oncology

## 2012-09-24 ENCOUNTER — Ambulatory Visit (HOSPITAL_BASED_OUTPATIENT_CLINIC_OR_DEPARTMENT_OTHER): Payer: Medicare Other

## 2012-09-24 ENCOUNTER — Other Ambulatory Visit (HOSPITAL_BASED_OUTPATIENT_CLINIC_OR_DEPARTMENT_OTHER): Payer: Medicare Other | Admitting: Lab

## 2012-09-24 VITALS — BP 125/71 | HR 86 | Temp 97.4°F

## 2012-09-24 DIAGNOSIS — C786 Secondary malignant neoplasm of retroperitoneum and peritoneum: Secondary | ICD-10-CM

## 2012-09-24 DIAGNOSIS — C189 Malignant neoplasm of colon, unspecified: Secondary | ICD-10-CM

## 2012-09-24 DIAGNOSIS — Z85118 Personal history of other malignant neoplasm of bronchus and lung: Secondary | ICD-10-CM

## 2012-09-24 DIAGNOSIS — C185 Malignant neoplasm of splenic flexure: Secondary | ICD-10-CM

## 2012-09-24 DIAGNOSIS — Z5112 Encounter for antineoplastic immunotherapy: Secondary | ICD-10-CM

## 2012-09-24 LAB — CBC WITH DIFFERENTIAL/PLATELET
Eosinophils Absolute: 0.4 10*3/uL (ref 0.0–0.5)
HCT: 43.5 % (ref 38.4–49.9)
LYMPH%: 20.9 % (ref 14.0–49.0)
MONO#: 0.4 10*3/uL (ref 0.1–0.9)
NEUT#: 6 10*3/uL (ref 1.5–6.5)
NEUT%: 69.9 % (ref 39.0–75.0)
Platelets: 195 10*3/uL (ref 140–400)
WBC: 8.6 10*3/uL (ref 4.0–10.3)
nRBC: 0 % (ref 0–0)

## 2012-09-24 MED ORDER — SODIUM CHLORIDE 0.9 % IV SOLN
Freq: Once | INTRAVENOUS | Status: AC
  Administered 2012-09-24: 16:00:00 via INTRAVENOUS

## 2012-09-24 MED ORDER — SODIUM CHLORIDE 0.9 % IV SOLN
4.7000 mg/kg | Freq: Once | INTRAVENOUS | Status: AC
  Administered 2012-09-24: 400 mg via INTRAVENOUS
  Filled 2012-09-24: qty 16

## 2012-09-24 NOTE — Telephone Encounter (Signed)
Pt came by today to get appt for October and November 2013l lab,chemo and CT

## 2012-09-24 NOTE — Patient Instructions (Signed)
Cancer Center Discharge Instructions for Patients Receiving Chemotherapy  Today you received the following chemotherapy agents Avastin.  To help prevent nausea and vomiting after your treatment, we encourage you to take your nausea medication as prescribed.   If you develop nausea and vomiting that is not controlled by your nausea medication, call the clinic. If it is after clinic hours your family physician or the after hours number for the clinic or go to the Emergency Department.   BELOW ARE SYMPTOMS THAT SHOULD BE REPORTED IMMEDIATELY:  *FEVER GREATER THAN 100.5 F  *CHILLS WITH OR WITHOUT FEVER  NAUSEA AND VOMITING THAT IS NOT CONTROLLED WITH YOUR NAUSEA MEDICATION  *UNUSUAL SHORTNESS OF BREATH  *UNUSUAL BRUISING OR BLEEDING  TENDERNESS IN MOUTH AND THROAT WITH OR WITHOUT PRESENCE OF ULCERS  *URINARY PROBLEMS  *BOWEL PROBLEMS  UNUSUAL RASH Items with * indicate a potential emergency and should be followed up as soon as possible.  One of the nurses will contact you 24 hours after your treatment. Please let the nurse know about any problems that you may have experienced. Feel free to call the clinic you have any questions or concerns. The clinic phone number is [phone].     

## 2012-10-01 ENCOUNTER — Other Ambulatory Visit: Payer: Self-pay | Admitting: *Deleted

## 2012-10-01 DIAGNOSIS — C189 Malignant neoplasm of colon, unspecified: Secondary | ICD-10-CM

## 2012-10-01 MED ORDER — CAPECITABINE 500 MG PO TABS: 2000.0000 mg | ORAL_TABLET | Freq: Two times a day (BID) | ORAL | Status: DC

## 2012-10-01 NOTE — Telephone Encounter (Signed)
[facility] faxed confirmation of facsimile receipt for xeloda.  Insurance will be verified and delivery arrangements made with patient.

## 2012-10-07 NOTE — Telephone Encounter (Signed)
RECEIVED A FAX FROM [employer] CONCERNING A CONFIRMATION OF PRESCRIPTION SHIPMENT FOR XELODA. 

## 2012-10-08 ENCOUNTER — Ambulatory Visit (HOSPITAL_BASED_OUTPATIENT_CLINIC_OR_DEPARTMENT_OTHER): Payer: Medicare Other

## 2012-10-08 ENCOUNTER — Telehealth: Payer: Self-pay | Admitting: *Deleted

## 2012-10-08 ENCOUNTER — Other Ambulatory Visit (HOSPITAL_BASED_OUTPATIENT_CLINIC_OR_DEPARTMENT_OTHER): Payer: Medicare Other | Admitting: Lab

## 2012-10-08 VITALS — BP 129/72 | HR 83 | Temp 97.6°F

## 2012-10-08 DIAGNOSIS — C185 Malignant neoplasm of splenic flexure: Secondary | ICD-10-CM

## 2012-10-08 DIAGNOSIS — C786 Secondary malignant neoplasm of retroperitoneum and peritoneum: Secondary | ICD-10-CM

## 2012-10-08 DIAGNOSIS — C189 Malignant neoplasm of colon, unspecified: Secondary | ICD-10-CM

## 2012-10-08 DIAGNOSIS — Z5112 Encounter for antineoplastic immunotherapy: Secondary | ICD-10-CM

## 2012-10-08 LAB — CBC WITH DIFFERENTIAL/PLATELET
Basophils Absolute: 0 10*3/uL (ref 0.0–0.1)
Eosinophils Absolute: 0.4 10*3/uL (ref 0.0–0.5)
HGB: 14.6 g/dL (ref 13.0–17.1)
MCV: 94.7 fL (ref 79.3–98.0)
MONO#: 0.4 10*3/uL (ref 0.1–0.9)
MONO%: 5.4 % (ref 0.0–14.0)
NEUT#: 4.8 10*3/uL (ref 1.5–6.5)
Platelets: 125 10*3/uL — ABNORMAL LOW (ref 140–400)
RDW: 18.9 % — ABNORMAL HIGH (ref 11.0–14.6)

## 2012-10-08 LAB — UA PROTEIN, DIPSTICK - CHCC: Protein, ur: NEGATIVE mg/dL

## 2012-10-08 MED ORDER — SODIUM CHLORIDE 0.9 % IV SOLN
4.7000 mg/kg | Freq: Once | INTRAVENOUS | Status: AC
  Administered 2012-10-08: 400 mg via INTRAVENOUS
  Filled 2012-10-08: qty 16

## 2012-10-08 MED ORDER — SODIUM CHLORIDE 0.9 % IV SOLN
Freq: Once | INTRAVENOUS | Status: AC
  Administered 2012-10-08: 16:00:00 via INTRAVENOUS

## 2012-10-08 NOTE — Patient Instructions (Addendum)
[facility] Discharge Instructions for Patients Receiving Chemotherapy  Today you received the following chemotherapy agents Avastin  To help prevent nausea and vomiting after your treatment, we encourage you to take your nausea medication as prescribed.   If you develop nausea and vomiting that is not controlled by your nausea medication, call the clinic. If it is after clinic hours your family physician or the after hours number for the clinic or go to the Emergency Department.   BELOW ARE SYMPTOMS THAT SHOULD BE REPORTED IMMEDIATELY:  *FEVER GREATER THAN 100.5 F  *CHILLS WITH OR WITHOUT FEVER  NAUSEA AND VOMITING THAT IS NOT CONTROLLED WITH YOUR NAUSEA MEDICATION  *UNUSUAL SHORTNESS OF BREATH  *UNUSUAL BRUISING OR BLEEDING  TENDERNESS IN MOUTH AND THROAT WITH OR WITHOUT PRESENCE OF ULCERS  *URINARY PROBLEMS  *BOWEL PROBLEMS  UNUSUAL RASH Items with * indicate a potential emergency and should be followed up as soon as possible.  Feel free to call the clinic you have any questions or concerns. The clinic phone number is [phone].   I have been informed and understand all the instructions given to me. I know to contact the clinic, my physician, or go to the Emergency Department if any problems should occur. I do not have any questions at this time, but understand that I may call the clinic during office hours   should I have any questions or need assistance in obtaining follow up care.  

## 2012-10-08 NOTE — Telephone Encounter (Signed)
Per staff message I have moved appt for 11/13 to later in the day.   JMW

## 2012-10-10 ENCOUNTER — Telehealth: Payer: Self-pay | Admitting: Oncology

## 2012-10-10 NOTE — Telephone Encounter (Signed)
Pt aware of new appt time for 10/30 and he has appt for November 2013

## 2012-10-17 ENCOUNTER — Other Ambulatory Visit: Payer: Self-pay | Admitting: Oncology

## 2012-10-17 ENCOUNTER — Ambulatory Visit (HOSPITAL_COMMUNITY): Payer: Medicare Other

## 2012-10-17 ENCOUNTER — Other Ambulatory Visit (HOSPITAL_BASED_OUTPATIENT_CLINIC_OR_DEPARTMENT_OTHER): Payer: Medicare Other

## 2012-10-17 ENCOUNTER — Ambulatory Visit (HOSPITAL_COMMUNITY)
Admission: RE | Admit: 2012-10-17 | Discharge: 2012-10-17 | Disposition: A | Discharge status: Home / Self Care | Payer: Medicare Other | Source: Ambulatory Visit | Attending: Oncology | Admitting: Oncology

## 2012-10-17 DIAGNOSIS — C189 Malignant neoplasm of colon, unspecified: Secondary | ICD-10-CM | POA: Insufficient documentation

## 2012-10-17 DIAGNOSIS — C7951 Secondary malignant neoplasm of bone: Secondary | ICD-10-CM

## 2012-10-17 DIAGNOSIS — C3490 Malignant neoplasm of unspecified part of unspecified bronchus or lung: Secondary | ICD-10-CM

## 2012-10-17 DIAGNOSIS — D649 Anemia, unspecified: Secondary | ICD-10-CM

## 2012-10-17 LAB — CBC WITH DIFFERENTIAL/PLATELET
BASO%: 0.4 % (ref 0.0–2.0)
LYMPH%: 22.2 % (ref 14.0–49.0)
MCHC: 34.7 g/dL (ref 32.0–36.0)
MONO#: 0.4 10*3/uL (ref 0.1–0.9)
MONO%: 5.2 % (ref 0.0–14.0)
NEUT#: 4.9 10*3/uL (ref 1.5–6.5)
Platelets: 151 10*3/uL (ref 140–400)
RBC: 4.71 10*6/uL (ref 4.20–5.82)
RDW: 18.8 % — ABNORMAL HIGH (ref 11.0–14.6)
WBC: 7.5 10*3/uL (ref 4.0–10.3)

## 2012-10-17 LAB — COMPREHENSIVE METABOLIC PANEL (CC13)
ALT: 15 U/L (ref 0–55)
AST: 16 U/L (ref 5–34)
Chloride: 106 mEq/L (ref 98–107)
Creatinine: 1.2 mg/dL (ref 0.7–1.3)
Sodium: 139 mEq/L (ref 136–145)
Total Bilirubin: 1.3 mg/dL — ABNORMAL HIGH (ref 0.20–1.20)
Total Protein: 6.8 g/dL (ref 6.4–8.3)

## 2012-10-17 LAB — CEA: CEA: 4.9 ng/mL (ref 0.0–5.0)

## 2012-10-17 MED ORDER — IOHEXOL 300 MG/ML  SOLN
100.0000 mL | Freq: Once | INTRAMUSCULAR | Status: AC | PRN
  Administered 2012-10-17: 100 mL via INTRAVENOUS

## 2012-10-20 ENCOUNTER — Telehealth: Payer: Self-pay | Admitting: *Deleted

## 2012-10-20 NOTE — Telephone Encounter (Signed)
Called and spoke with patient.  Stable CT with no progressive changes.  Patient appreciated the call

## 2012-10-20 NOTE — Telephone Encounter (Signed)
Message copied by Orbie Hurst on Mon Oct 20, 2012  1:59 PM ------      Message from: Levert Feinstein      Created: Fri Oct 17, 2012  9:40 PM       Call pt  Stable  CT no progressive changes

## 2012-10-22 ENCOUNTER — Ambulatory Visit (HOSPITAL_BASED_OUTPATIENT_CLINIC_OR_DEPARTMENT_OTHER): Payer: Medicare Other

## 2012-10-22 ENCOUNTER — Ambulatory Visit: Payer: Medicare Other

## 2012-10-22 VITALS — BP 133/72 | HR 76 | Temp 98.4°F | Resp 18

## 2012-10-22 DIAGNOSIS — Z5112 Encounter for antineoplastic immunotherapy: Secondary | ICD-10-CM

## 2012-10-22 DIAGNOSIS — C189 Malignant neoplasm of colon, unspecified: Secondary | ICD-10-CM

## 2012-10-22 DIAGNOSIS — C185 Malignant neoplasm of splenic flexure: Secondary | ICD-10-CM

## 2012-10-22 DIAGNOSIS — C786 Secondary malignant neoplasm of retroperitoneum and peritoneum: Secondary | ICD-10-CM

## 2012-10-22 MED ORDER — SODIUM CHLORIDE 0.9 % IV SOLN: Freq: Once | INTRAVENOUS | Status: DC

## 2012-10-22 MED ORDER — SODIUM CHLORIDE 0.9 % IV SOLN
4.7000 mg/kg | Freq: Once | INTRAVENOUS | Status: AC
  Administered 2012-10-22: 400 mg via INTRAVENOUS
  Filled 2012-10-22: qty 16

## 2012-10-22 NOTE — Patient Instructions (Signed)
[facility] Discharge Instructions for Patients Receiving Chemotherapy  Today you received the following chemotherapy agents Avastin To help prevent nausea and vomiting after your treatment, we encourage you to take your nausea medication as prescribed.  If you develop nausea and vomiting that is not controlled by your nausea medication, call the clinic. If it is after clinic hours your family physician or the after hours number for the clinic or go to the Emergency Department.   BELOW ARE SYMPTOMS THAT SHOULD BE REPORTED IMMEDIATELY:  *FEVER GREATER THAN 100.5 F  *CHILLS WITH OR WITHOUT FEVER  NAUSEA AND VOMITING THAT IS NOT CONTROLLED WITH YOUR NAUSEA MEDICATION  *UNUSUAL SHORTNESS OF BREATH  *UNUSUAL BRUISING OR BLEEDING  TENDERNESS IN MOUTH AND THROAT WITH OR WITHOUT PRESENCE OF ULCERS  *URINARY PROBLEMS  *BOWEL PROBLEMS  UNUSUAL RASH Items with * indicate a potential emergency and should be followed up as soon as possible.  One of the nurses will contact you 24 hours after your treatment. Please let the nurse know about any problems that you may have experienced. Feel free to call the clinic you have any questions or concerns. The clinic phone number is [phone].   I have been informed and understand all the instructions given to me. I know to contact the clinic, my physician, or go to the Emergency Department if any problems should occur. I do not have any questions at this time, but understand that I may call the clinic during office hours   should I have any questions or need assistance in obtaining follow up care.    __________________________________________  _____________  __________ Signature of Patient or Authorized Representative            Date                   Time    __________________________________________ Nurse's Signature    

## 2012-10-24 ENCOUNTER — Telehealth: Payer: Self-pay | Admitting: Oncology

## 2012-10-24 ENCOUNTER — Telehealth: Payer: Self-pay | Admitting: *Deleted

## 2012-10-24 ENCOUNTER — Ambulatory Visit: Payer: Medicare Other

## 2012-10-24 ENCOUNTER — Ambulatory Visit (HOSPITAL_BASED_OUTPATIENT_CLINIC_OR_DEPARTMENT_OTHER): Payer: Medicare Other | Admitting: Oncology

## 2012-10-24 VITALS — BP 120/72 | HR 79 | Temp 98.2°F | Resp 20 | Ht 67.0 in | Wt 174.3 lb

## 2012-10-24 DIAGNOSIS — C185 Malignant neoplasm of splenic flexure: Secondary | ICD-10-CM

## 2012-10-24 DIAGNOSIS — C3490 Malignant neoplasm of unspecified part of unspecified bronchus or lung: Secondary | ICD-10-CM

## 2012-10-24 DIAGNOSIS — C3410 Malignant neoplasm of upper lobe, unspecified bronchus or lung: Secondary | ICD-10-CM

## 2012-10-24 DIAGNOSIS — C786 Secondary malignant neoplasm of retroperitoneum and peritoneum: Secondary | ICD-10-CM

## 2012-10-24 DIAGNOSIS — C189 Malignant neoplasm of colon, unspecified: Secondary | ICD-10-CM

## 2012-10-24 NOTE — Telephone Encounter (Signed)
Per staff message I have schedueld appts.  JMW  

## 2012-10-24 NOTE — Telephone Encounter (Signed)
appts made and printed for pt  °

## 2012-10-24 NOTE — Progress Notes (Signed)
Hematology and Oncology Follow Up Visit  [name] [id number] 05-15-1941 71 y.o. 10/24/2012 6:17 PM   Principle Diagnosis: Encounter Diagnoses  Name Primary?  . NEOPLASM, MALIGNANT, LUNG, NON-SMALL CELL   . 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 He developed a rise in CEA tumor marker and 2 intra-abdominal soft tissue densities on followup CT scan done in November 2012 while on adjuvant Xeloda chemotherapy. Oxaliplatin and Avastin were added to his regimen. Oxaliplatinum was started 11/26/2011. He  had a steady fall in his CEA tumor marker from peak value of 12.6 on 11/02/2011 to 2.3 on, 3.1 on July 10.  Unfortunately, he developed cumulative neurotoxicity from the Oxaliplatinum. We were following his symptoms closely but  he had a sudden worsening of neuropathy, hands greater than feet, and was having trouble buttoning buttons and uncomfortable dysesthesias. I had to stop the Oxaliplatinum. He continues on Xeloda one week on 1 week off and every two-week Avastin.  .  Of interest is the fact that since we added Avastin to his regimen, the right lower lobe lung lesion has decreased in size and is no longer obvious on the current study. This represents a second primary lung cancer unrelated to his colon cancer.  Clinically he is stable area in he reports no abdominal pain or cramping, no hematochezia or melena, no headache or change in vision, no bone pain.  CT scan of the chest abdomen and pelvis done in anticipation of today's visit on October 25 which I personally reviewed, shows stable disease with a few peritoneal nodules all less than 1 cm. No liver involvement. Stable findings in the chest with resolution of previous right lower lobe patchy infiltrate which we presumed was a  area of bronchial alveolar carcinoma.  CEA slowly rising again. Current value 4.9 on October 25 compare with 3.0 on August 21.  Medications: reviewed  Allergies: No Known Allergies  Review of Systems: Constitutional:   Appetite is good Respiratory:no cough or dyspnea Cardiovascular: No chest pain or palpitations  Gastrointestinal: See above Genito-Urinary: No urinary tract symptoms Musculoskeletal: No muscle or bone pain Neurologic: See above Skin: No rash Remaining ROS negative.  Physical Exam: Blood pressure 120/72, pulse 79, temperature 98.2 F (36.8 C), temperature source Oral, resp. rate 20, height 5\' 7"  (1.702 m), weight 174 lb 4.8 oz (79.062 kg). Wt Readings from Last 3 Encounters:  10/24/12 174 lb 4.8 oz (79.062 kg)  09/12/12 169 lb 14.4 oz (77.066 kg)  08/29/12 171 lb 2 oz (77.622 kg)     General appearance: He appears chronically ill but his weight is up 4 pounds compare with his September visit HENNT: Pharynx no erythema exudate or ulcer Lymph nodes: No adenopathy Breasts: Lungs: Clear to auscultation resonant to percussion Heart: Regular rhythm no murmur Abdomen: Soft, nontender, no mass, no organomegaly Extremities: No edema, no calf tenderness Vascular: No cyanosis Neurologic: Corneal transplants, motor strength 5 over 5, reflexes 1+ symmetric Skin: No rash or ecchymosis  Lab Results: Lab Results  Component Value Date   WBC 7.5 10/17/2012   HGB 15.5 10/17/2012   HCT 44.7 10/17/2012   MCV 94.9 10/17/2012   PLT 151 10/17/2012     Chemistry      Component Value Date/Time   NA 139 10/17/2012 1509  NA 139 08/28/2012 1130   NA 139 10/15/2011 1601   K 4.4 10/17/2012 1509   K 4.3 08/28/2012 1130   K 4.3 10/15/2011 1601   CL 106 10/17/2012 1509   CL 104 08/28/2012 1130   CL 101 10/15/2011 1601   CO2 26 10/17/2012 1509   CO2 25 08/28/2012 1130   CO2 30 10/15/2011 1601   BUN 12.0 10/17/2012 1509   BUN 10 08/28/2012 1130   BUN 17 10/15/2011 1601    CREATININE 1.2 10/17/2012 1509   CREATININE 1.01 08/28/2012 1130   CREATININE 1.1 10/15/2011 1601      Component Value Date/Time   CALCIUM 10.3 10/17/2012 1509   CALCIUM 10.3 08/28/2012 1130   CALCIUM 9.3 10/15/2011 1601   ALKPHOS 49 10/17/2012 1509   ALKPHOS 42 08/13/2012 1455   ALKPHOS 59 10/15/2011 1601   AST 16 10/17/2012 1509   AST 16 08/13/2012 1455   AST 35 10/15/2011 1601   ALT 15 10/17/2012 1509   ALT 11 08/13/2012 1455   BILITOT 1.30* 10/17/2012 1509   BILITOT 0.9 08/13/2012 1455   BILITOT 1.30 10/15/2011 1601       Radiological Studies: Ct Chest W Contrast  10/17/2012  *RADIOLOGY REPORT*  Clinical Data:  Fall lung cancer and colon cancer.  Chemotherapy ongoing.  Lung cancer diagnosed 2007.  Colon cancer diagnosed 2012.  CT CHEST, ABDOMEN AND PELVIS WITH CONTRAST  Technique:  Multidetector CT imaging of the chest, abdomen and pelvis was performed following the standard protocol during bolus administration of intravenous contrast.  Contrast: OMNIPAQUE IOHEXOL 300 MG/ML  SOLN CT 14,013  Comparison:  CT 08/06/2012  CT CHEST  Findings:  No axillary or supraclavicular lymphadenopathy.  No mediastinal or hilar lymphadenopathy.  No pericardial fluid. Esophagus is normal.  This is 9 mm subcarinal lymph node which is not changed from prior.  Review lung parenchyma demonstrates a postsurgical change in the inferior left along no new nodularity present.  The right lung is clear.  Airways are normal.  IMPRESSION: Normal.  No evidence of lung cancer recurrence or colon cancer metastasis. 2.  Stable postsurgical change in the inferior left lung.  CT ABDOMEN AND PELVIS  Findings:    Along the posterior margin of the right hepatic lobe there is an 8 mm nodular density which is unchanged from prior.  No new hepatic lesions present.  Gallbladder, pancreas, spleen, adrenal glands, kidneys are normal.  The stomach, small bowel, appendix, cecum are normal.  Colon rectosigmoid colon are normal.   Abdominal aorta normal caliber.  No retroperitoneal lymphadenopathy.  Small the mesenteric lymph node measuring 9 mm in the left abdomen (image 69) is unchanged.  The peroneal implants along the transverse colon are again demonstrated.  8 mm nodule in the left upper quadrant (image 63) unchanged from 8 mm on prior.  8 mm nodule anterior to the transverse colon (image 63)  is unchanged from 7 mm on prior.  No new peritoneal disease evident.  In the pelvis, the prostate gland bladder normal.  No pelvic lymphadenopathy. Review of  bone windows demonstrates no aggressive osseous lesions.  IMPRESSION:  1. Stable peritoneal metastasis along the ventral peritoneal surface along the right hepatic margin.  2.  No evidence disease progression.   Original Report Authenticated By: Genevive Bi, M.D.    Ct Abdomen Pelvis W Contrast  10/17/2012  *RADIOLOGY REPORT*  Clinical Data:  Fall lung cancer and colon cancer.  Chemotherapy ongoing.  Lung cancer diagnosed 2007.  Colon  cancer diagnosed 2012.  CT CHEST, ABDOMEN AND PELVIS WITH CONTRAST  Technique:  Multidetector CT imaging of the chest, abdomen and pelvis was performed following the standard protocol during bolus administration of intravenous contrast.  Contrast: OMNIPAQUE IOHEXOL 300 MG/ML  SOLN CT 14,013  Comparison:  CT 08/06/2012  CT CHEST  Findings:  No axillary or supraclavicular lymphadenopathy.  No mediastinal or hilar lymphadenopathy.  No pericardial fluid. Esophagus is normal.  This is 9 mm subcarinal lymph node which is not changed from prior.  Review lung parenchyma demonstrates a postsurgical change in the inferior left along no new nodularity present.  The right lung is clear.  Airways are normal.  IMPRESSION: Normal.  No evidence of lung cancer recurrence or colon cancer metastasis. 2.  Stable postsurgical change in the inferior left lung.  CT ABDOMEN AND PELVIS  Findings:    Along the posterior margin of the right hepatic lobe there is an 8 mm  nodular density which is unchanged from prior.  No new hepatic lesions present.  Gallbladder, pancreas, spleen, adrenal glands, kidneys are normal.  The stomach, small bowel, appendix, cecum are normal.  Colon rectosigmoid colon are normal.  Abdominal aorta normal caliber.  No retroperitoneal lymphadenopathy.  Small the mesenteric lymph node measuring 9 mm in the left abdomen (image 69) is unchanged.  The peroneal implants along the transverse colon are again demonstrated.  8 mm nodule in the left upper quadrant (image 63) unchanged from 8 mm on prior.  8 mm nodule anterior to the transverse colon (image 63)  is unchanged from 7 mm on prior.  No new peritoneal disease evident.  In the pelvis, the prostate gland bladder normal.  No pelvic lymphadenopathy. Review of  bone windows demonstrates no aggressive osseous lesions.  IMPRESSION:  1. Stable peritoneal metastasis along the ventral peritoneal surface along the right hepatic margin.  2.  No evidence disease progression.   Original Report Authenticated By: Genevive Bi, M.D.     Impression and Plan:  #1. 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  He has persistent but unchanged subcentimeter peritoneal nodules. Although his CEA is slowly rising again there is no gross evidence for progressive disease on scans or clinically. Plan: I'm going to continue the current regimen of oral is a lot of plus every 2 weekly Avastin for now. We don't have many other good options. .  #2. Status post resection of a stage I non-small cell cancer left upper lung with postop brachitherapy June 2007.   #3. Vague, alveolar density right lower lung felt to be a second primary bronchioloalveolar lung cancer found at the same time as the initial cancer in the left upper lung no longer visible on current CT and likely represents therapeutic effect of the Avastin.   #4. Oxaliplatinum related neuropathy   #5. Type 2 diabetes   #6. History of nephrolithiasis   #7. Status post  bilateral corneal transplants   CC:. Dr. Guillermina City; Dr. Forde Radon, MD 11/1/20136:17 PM

## 2012-11-04 ENCOUNTER — Encounter: Payer: Self-pay | Admitting: *Deleted

## 2012-11-04 NOTE — Progress Notes (Signed)
RECEIVED A FAX FROM [employer] CONCERNING A CONFIRMATION OF PRESCRIPTION SHIPMENT FOR XELODA ON 11/03/12.

## 2012-11-05 ENCOUNTER — Other Ambulatory Visit (HOSPITAL_BASED_OUTPATIENT_CLINIC_OR_DEPARTMENT_OTHER): Payer: Medicare Other | Admitting: Lab

## 2012-11-05 ENCOUNTER — Ambulatory Visit (HOSPITAL_BASED_OUTPATIENT_CLINIC_OR_DEPARTMENT_OTHER): Payer: Medicare Other

## 2012-11-05 VITALS — BP 130/67 | HR 73 | Temp 96.8°F | Resp 17

## 2012-11-05 DIAGNOSIS — Z5112 Encounter for antineoplastic immunotherapy: Secondary | ICD-10-CM

## 2012-11-05 DIAGNOSIS — C189 Malignant neoplasm of colon, unspecified: Secondary | ICD-10-CM

## 2012-11-05 DIAGNOSIS — C3490 Malignant neoplasm of unspecified part of unspecified bronchus or lung: Secondary | ICD-10-CM

## 2012-11-05 DIAGNOSIS — C185 Malignant neoplasm of splenic flexure: Secondary | ICD-10-CM

## 2012-11-05 DIAGNOSIS — C801 Malignant (primary) neoplasm, unspecified: Secondary | ICD-10-CM

## 2012-11-05 DIAGNOSIS — C786 Secondary malignant neoplasm of retroperitoneum and peritoneum: Secondary | ICD-10-CM

## 2012-11-05 LAB — CBC WITH DIFFERENTIAL/PLATELET
Eosinophils Absolute: 0.4 10*3/uL (ref 0.0–0.5)
LYMPH%: 21.3 % (ref 14.0–49.0)
MONO#: 0.5 10*3/uL (ref 0.1–0.9)
NEUT#: 4.7 10*3/uL (ref 1.5–6.5)
Platelets: 138 10*3/uL — ABNORMAL LOW (ref 140–400)
RBC: 5.05 10*6/uL (ref 4.20–5.82)
WBC: 7.2 10*3/uL (ref 4.0–10.3)
nRBC: 0 % (ref 0–0)

## 2012-11-05 LAB — UA PROTEIN, DIPSTICK - CHCC: Protein, ur: <30 mg/dL

## 2012-11-05 MED ORDER — SODIUM CHLORIDE 0.9 % IV SOLN
Freq: Once | INTRAVENOUS | Status: AC
  Administered 2012-11-05: 17:00:00 via INTRAVENOUS

## 2012-11-05 MED ORDER — SODIUM CHLORIDE 0.9 % IV SOLN
4.7000 mg/kg | Freq: Once | INTRAVENOUS | Status: AC
  Administered 2012-11-05: 400 mg via INTRAVENOUS
  Filled 2012-11-05: qty 16

## 2012-11-05 NOTE — Patient Instructions (Signed)
Call MD for problems 

## 2012-11-07 ENCOUNTER — Encounter: Payer: Self-pay | Admitting: Oncology

## 2012-11-07 NOTE — Progress Notes (Signed)
Received approval letter from [employer].  Pt is approved for Xeloda from 11/04/12 to 11/03/13.  I emailed copy of letter to [place] with the billing dept.

## 2012-11-12 ENCOUNTER — Other Ambulatory Visit: Payer: Self-pay | Admitting: Dermatology

## 2012-11-13 ENCOUNTER — Other Ambulatory Visit: Payer: Self-pay | Admitting: Oncology

## 2012-11-18 ENCOUNTER — Other Ambulatory Visit (HOSPITAL_BASED_OUTPATIENT_CLINIC_OR_DEPARTMENT_OTHER): Payer: Medicare Other | Admitting: Lab

## 2012-11-18 ENCOUNTER — Ambulatory Visit (HOSPITAL_BASED_OUTPATIENT_CLINIC_OR_DEPARTMENT_OTHER): Payer: Medicare Other

## 2012-11-18 VITALS — BP 131/73 | HR 78 | Temp 97.4°F

## 2012-11-18 DIAGNOSIS — C3490 Malignant neoplasm of unspecified part of unspecified bronchus or lung: Secondary | ICD-10-CM

## 2012-11-18 DIAGNOSIS — C185 Malignant neoplasm of splenic flexure: Secondary | ICD-10-CM

## 2012-11-18 DIAGNOSIS — C801 Malignant (primary) neoplasm, unspecified: Secondary | ICD-10-CM

## 2012-11-18 DIAGNOSIS — C189 Malignant neoplasm of colon, unspecified: Secondary | ICD-10-CM

## 2012-11-18 DIAGNOSIS — C786 Secondary malignant neoplasm of retroperitoneum and peritoneum: Secondary | ICD-10-CM

## 2012-11-18 DIAGNOSIS — Z5112 Encounter for antineoplastic immunotherapy: Secondary | ICD-10-CM

## 2012-11-18 LAB — CBC WITH DIFFERENTIAL/PLATELET
Basophils Absolute: 0 10*3/uL (ref 0.0–0.1)
Eosinophils Absolute: 0.4 10*3/uL (ref 0.0–0.5)
HGB: 16.1 g/dL (ref 13.0–17.1)
MCV: 93.6 fL (ref 79.3–98.0)
MONO#: 0.5 10*3/uL (ref 0.1–0.9)
NEUT#: 5.9 10*3/uL (ref 1.5–6.5)
RDW: 18.6 % — ABNORMAL HIGH (ref 11.0–14.6)
lymph#: 1.4 10*3/uL (ref 0.9–3.3)

## 2012-11-18 LAB — UA PROTEIN, DIPSTICK - CHCC: Protein, ur: <30 mg/dL

## 2012-11-18 MED ORDER — SODIUM CHLORIDE 0.9 % IV SOLN
Freq: Once | INTRAVENOUS | Status: AC
  Administered 2012-11-18: 12:00:00 via INTRAVENOUS

## 2012-11-18 MED ORDER — SODIUM CHLORIDE 0.9 % IV SOLN
4.7000 mg/kg | Freq: Once | INTRAVENOUS | Status: AC
  Administered 2012-11-18: 400 mg via INTRAVENOUS
  Filled 2012-11-18: qty 16

## 2012-11-18 NOTE — Patient Instructions (Signed)
[facility] Discharge Instructions for Patients Receiving Chemotherapy  Today you received the following chemotherapy agents Avastin  To help prevent nausea and vomiting after your treatment, we encourage you to take your nausea medication as prescribed.   If you develop nausea and vomiting that is not controlled by your nausea medication, call the clinic. If it is after clinic hours your family physician or the after hours number for the clinic or go to the Emergency Department.   BELOW ARE SYMPTOMS THAT SHOULD BE REPORTED IMMEDIATELY:  *FEVER GREATER THAN 100.5 F  *CHILLS WITH OR WITHOUT FEVER  NAUSEA AND VOMITING THAT IS NOT CONTROLLED WITH YOUR NAUSEA MEDICATION  *UNUSUAL SHORTNESS OF BREATH  *UNUSUAL BRUISING OR BLEEDING  TENDERNESS IN MOUTH AND THROAT WITH OR WITHOUT PRESENCE OF ULCERS  *URINARY PROBLEMS  *BOWEL PROBLEMS  UNUSUAL RASH Items with * indicate a potential emergency and should be followed up as soon as possible.  Feel free to call the clinic you have any questions or concerns. The clinic phone number is [phone].   I have been informed and understand all the instructions given to me. I know to contact the clinic, my physician, or go to the Emergency Department if any problems should occur. I do not have any questions at this time, but understand that I may call the clinic during office hours   should I have any questions or need assistance in obtaining follow up care.  

## 2012-11-25 ENCOUNTER — Other Ambulatory Visit: Payer: Self-pay | Admitting: *Deleted

## 2012-11-25 NOTE — Telephone Encounter (Signed)
THIS REFILL REQUEST FOR XELODA WAS PLACED IN DR.GRANFORTUNA'S ACTIVE WORK BOX.

## 2012-11-26 ENCOUNTER — Other Ambulatory Visit: Payer: Self-pay | Admitting: Oncology

## 2012-11-27 ENCOUNTER — Other Ambulatory Visit: Payer: Self-pay | Admitting: *Deleted

## 2012-11-27 DIAGNOSIS — C189 Malignant neoplasm of colon, unspecified: Secondary | ICD-10-CM

## 2012-11-27 MED ORDER — CAPECITABINE 500 MG PO TABS: 2000.0000 mg | ORAL_TABLET | Freq: Two times a day (BID) | ORAL | Status: DC

## 2012-11-27 NOTE — Telephone Encounter (Signed)
[employer] faxed confirmation of facsimile receipt for xeloda.  Will verify insurance and make delivery arrangements with patient.

## 2012-12-03 ENCOUNTER — Other Ambulatory Visit (HOSPITAL_BASED_OUTPATIENT_CLINIC_OR_DEPARTMENT_OTHER): Payer: Medicare Other | Admitting: Lab

## 2012-12-03 ENCOUNTER — Ambulatory Visit (HOSPITAL_BASED_OUTPATIENT_CLINIC_OR_DEPARTMENT_OTHER): Payer: Medicare Other

## 2012-12-03 VITALS — BP 121/70 | HR 81 | Temp 97.4°F

## 2012-12-03 DIAGNOSIS — C185 Malignant neoplasm of splenic flexure: Secondary | ICD-10-CM

## 2012-12-03 DIAGNOSIS — Z5112 Encounter for antineoplastic immunotherapy: Secondary | ICD-10-CM

## 2012-12-03 DIAGNOSIS — C3490 Malignant neoplasm of unspecified part of unspecified bronchus or lung: Secondary | ICD-10-CM

## 2012-12-03 DIAGNOSIS — C786 Secondary malignant neoplasm of retroperitoneum and peritoneum: Secondary | ICD-10-CM

## 2012-12-03 DIAGNOSIS — C189 Malignant neoplasm of colon, unspecified: Secondary | ICD-10-CM

## 2012-12-03 DIAGNOSIS — C801 Malignant (primary) neoplasm, unspecified: Secondary | ICD-10-CM

## 2012-12-03 LAB — COMPREHENSIVE METABOLIC PANEL (CC13)
ALT: 14 U/L (ref 0–55)
AST: 16 U/L (ref 5–34)
Albumin: 3.5 g/dL (ref 3.5–5.0)
Calcium: 9.7 mg/dL (ref 8.4–10.4)
Chloride: 110 mEq/L — ABNORMAL HIGH (ref 98–107)
Potassium: 4.6 mEq/L (ref 3.5–5.1)

## 2012-12-03 LAB — CBC WITH DIFFERENTIAL/PLATELET
BASO%: 0.4 % (ref 0.0–2.0)
Eosinophils Absolute: 0.4 10*3/uL (ref 0.0–0.5)
HGB: 16.5 g/dL (ref 13.0–17.1)
MCH: 32 pg (ref 27.2–33.4)
MCV: 93.6 fL (ref 79.3–98.0)
MONO#: 0.6 10*3/uL (ref 0.1–0.9)
NEUT%: 69.9 % (ref 39.0–75.0)
Platelets: 140 10*3/uL (ref 140–400)
lymph#: 1.7 10*3/uL (ref 0.9–3.3)
nRBC: 0 % (ref 0–0)

## 2012-12-03 LAB — CEA: CEA: 6.1 ng/mL — ABNORMAL HIGH (ref 0.0–5.0)

## 2012-12-03 MED ORDER — SODIUM CHLORIDE 0.9 % IV SOLN
4.7000 mg/kg | Freq: Once | INTRAVENOUS | Status: AC
  Administered 2012-12-03: 400 mg via INTRAVENOUS
  Filled 2012-12-03: qty 16

## 2012-12-03 MED ORDER — SODIUM CHLORIDE 0.9 % IV SOLN
Freq: Once | INTRAVENOUS | Status: AC
  Administered 2012-12-03: 15:00:00 via INTRAVENOUS

## 2012-12-03 NOTE — Patient Instructions (Addendum)
[facility] Discharge Instructions for Patients Receiving Chemotherapy  Today you received the following chemotherapy agents Avastin.   To help prevent nausea and vomiting after your treatment, we encourage you to take your nausea medication as ordered per MD.    If you develop nausea and vomiting that is not controlled by your nausea medication, call the clinic. If it is after clinic hours your family physician or the after hours number for the clinic or go to the Emergency Department.   BELOW ARE SYMPTOMS THAT SHOULD BE REPORTED IMMEDIATELY:  *FEVER GREATER THAN 100.5 F  *CHILLS WITH OR WITHOUT FEVER  NAUSEA AND VOMITING THAT IS NOT CONTROLLED WITH YOUR NAUSEA MEDICATION  *UNUSUAL SHORTNESS OF BREATH  *UNUSUAL BRUISING OR BLEEDING  TENDERNESS IN MOUTH AND THROAT WITH OR WITHOUT PRESENCE OF ULCERS  *URINARY PROBLEMS  *BOWEL PROBLEMS  UNUSUAL RASH Items with * indicate a potential emergency and should be followed up as soon as possible.   Please let the nurse know about any problems that you may have experienced. Feel free to call the clinic you have any questions or concerns. The clinic phone number is [phone].   I have been informed and understand all the instructions given to me. I know to contact the clinic, my physician, or go to the Emergency Department if any problems should occur. I do not have any questions at this time, but understand that I may call the clinic during office hours   should I have any questions or need assistance in obtaining follow up care.    __________________________________________  _____________  __________ Signature of Patient or Authorized Representative            Date                   Time    __________________________________________ Nurse's Signature

## 2012-12-05 ENCOUNTER — Telehealth: Payer: Self-pay | Admitting: Oncology

## 2012-12-05 ENCOUNTER — Telehealth: Payer: Self-pay | Admitting: *Deleted

## 2012-12-05 ENCOUNTER — Ambulatory Visit (HOSPITAL_BASED_OUTPATIENT_CLINIC_OR_DEPARTMENT_OTHER): Payer: Medicare Other | Admitting: Nurse Practitioner

## 2012-12-05 VITALS — BP 137/76 | HR 79 | Temp 97.0°F | Resp 20 | Ht 67.0 in | Wt 175.5 lb

## 2012-12-05 DIAGNOSIS — G620 Drug-induced polyneuropathy: Secondary | ICD-10-CM

## 2012-12-05 DIAGNOSIS — C189 Malignant neoplasm of colon, unspecified: Secondary | ICD-10-CM

## 2012-12-05 DIAGNOSIS — C3490 Malignant neoplasm of unspecified part of unspecified bronchus or lung: Secondary | ICD-10-CM

## 2012-12-05 DIAGNOSIS — C185 Malignant neoplasm of splenic flexure: Secondary | ICD-10-CM

## 2012-12-05 DIAGNOSIS — C786 Secondary malignant neoplasm of retroperitoneum and peritoneum: Secondary | ICD-10-CM

## 2012-12-05 NOTE — Progress Notes (Signed)
OFFICE PROGRESS NOTE  Interval history:  [name] is a 71 year old man with local intra-abdominal recurrence of colon cancer. He was initially diagnosed with stage III colon cancer in July 2012 status post left colectomy 07/09/2011. He developed a rise in the CEA tumor marker and 2 intra-abdominal soft tissue densities on followup CT scan in November 2012 while on adjuvant Xeloda chemotherapy. Oxaliplatin and Avastin were added to the regimen with a steady fall in the CEA. He developed cumulative neurotoxicity related to the oxaliplatin necessitating discontinuation. Xeloda was continued on a 1 week on/1 week off schedule and Avastin was continued every 2 weeks.  Restaging CT scans 10/17/2012 showed stable disease.  Most recent CEA on 12/03/2012 returned at 6.1 as compared to 4.9 on 10/17/2012.  [name] reports that he feels well. He thinks there may be some slight improvement in the numbness in the fingertips. He denies mouth sores. No nausea or vomiting. No diarrhea. No hand or foot pain or redness. He denies abdominal pain. No shortness of breath or chest pain. No leg swelling or calf pain.   Objective: Blood pressure 137/76, pulse 79, temperature 97 F (36.1 C), temperature source Oral, resp. rate 20, height 5\' 7"  (1.702 m), weight 175 lb 8 oz (79.606 kg).  Oropharynx is without thrush or ulceration. No palpable cervical, supra-clavicular or axillary lymph nodes. Lungs are clear. No wheezes or rales. Regular cardiac rhythm. Abdomen is soft and nontender. No organomegaly. Extremities are without edema. Calves are soft and nontender. Motor strength 5 over 5. Palms are nontender and without erythema.  Lab Results: Lab Results  Component Value Date   WBC 9.2 12/03/2012   HGB 16.5 12/03/2012   HCT 48.2 12/03/2012   MCV 93.6 12/03/2012   PLT 140 12/03/2012    Chemistry:    Chemistry      Component Value Date/Time   NA 141 12/03/2012 1419   NA 139 08/28/2012 1130   NA 139 10/15/2011  1601   K 4.6 12/03/2012 1419   K 4.3 08/28/2012 1130   K 4.3 10/15/2011 1601   CL 110* 12/03/2012 1419   CL 104 08/28/2012 1130   CL 101 10/15/2011 1601   CO2 24 12/03/2012 1419   CO2 25 08/28/2012 1130   CO2 30 10/15/2011 1601   BUN 19.0 12/03/2012 1419   BUN 10 08/28/2012 1130   BUN 17 10/15/2011 1601   CREATININE 1.3 12/03/2012 1419   CREATININE 1.01 08/28/2012 1130   CREATININE 1.1 10/15/2011 1601      Component Value Date/Time   CALCIUM 9.7 12/03/2012 1419   CALCIUM 10.3 08/28/2012 1130   CALCIUM 9.3 10/15/2011 1601   ALKPHOS 60 12/03/2012 1419   ALKPHOS 42 08/13/2012 1455   ALKPHOS 59 10/15/2011 1601   AST 16 12/03/2012 1419   AST 16 08/13/2012 1455   AST 35 10/15/2011 1601   ALT 14 12/03/2012 1419   ALT 11 08/13/2012 1455   BILITOT 1.28* 12/03/2012 1419   BILITOT 0.9 08/13/2012 1455   BILITOT 1.30 10/15/2011 1601     12/03/2012 CEA 6.1.  Studies/Results: No results found.  Medications: I have reviewed the patient's current medications.  Assessment/Plan:  1. Colon cancer metastatic to peritoneum currently on active treatment with Xeloda 2000 mg twice daily 7 days on/7 days off and Avastin every 2 weeks. 2. Status post resection of a stage I non-small cell lung cancer left upper lung with postop brachytherapy June 2007. 3. Vague, alveolar density right lower lung felt to be  a second primary bronchial alveolar lung cancer found at the same time his initial cancer in the left upper lung. The density was no longer visible on recent restaging CT evaluation. 4. Oxaliplatin neuropathy. Slight improvement. 5. Type 2 diabetes. 6. History of nephrolithiasis. 7. Status post bilateral corneal transplants.  Disposition-Mr. Zagal appears stable clinically. The CEA has increased slightly recently. He will continue Xeloda/Avastin on the current schedule with plans for the next restaging CT evaluation in approximately 6 weeks which will be at a 12 week interval from the previous. He will  return for a followup visit with Dr. Cyndie Chime on 01/09/2013 to review the results of the restaging scans. He will contact the office in the interim with any problems.  Lonna Cobb ANP/GNP-BC

## 2012-12-05 NOTE — Telephone Encounter (Signed)
appts made and printed for pt Contrast given to pt Aware that he will get a call/letter with scan

## 2012-12-05 NOTE — Telephone Encounter (Signed)
Per staff phone call and POF I have scheduled appts. JMW  

## 2012-12-10 ENCOUNTER — Other Ambulatory Visit: Payer: Self-pay | Admitting: Oncology

## 2012-12-13 IMAGING — CT CT ABD-PELV W/ CM
2 of 5 series · 16 of 46 positions shown, 18 images · IV contrast (agent unspecified)
Comparison: 06/06/2011; 09/08/2008

CLINICAL DATA: Colonoscopy 2 days ago.  Colon mass.

CT ABDOMEN AND PELVIS WITH CONTRAST
TECHNIQUE: Multidetector CT imaging of the abdomen and pelvis was
performed following the standard protocol during bolus
administration of intravenous contrast.
Contrast: 100 ml [id number]

[Series 2: rtn a/p with · axial · 0.89mm/px · z∈[-490,-90]mm · 13 of 90 slices shown, 15 images]
[im 5/90  soft-tissue]
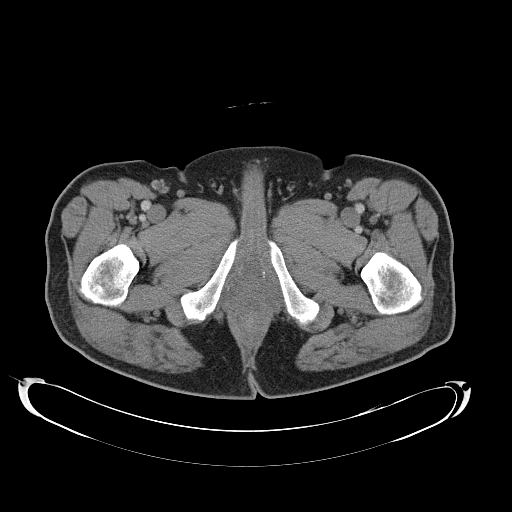
[im 5/90  bone]
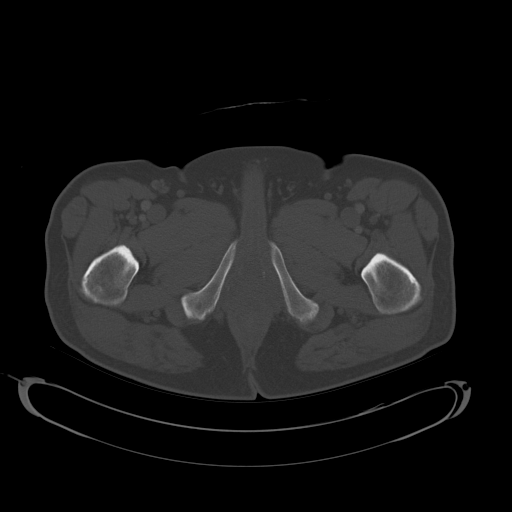
[im 15/90  soft-tissue]
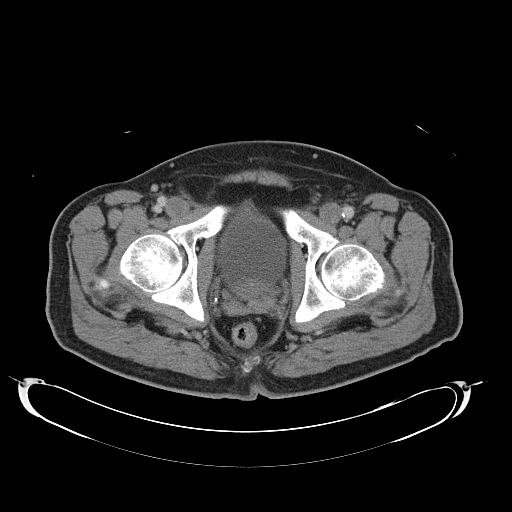
[im 19/90  soft-tissue]
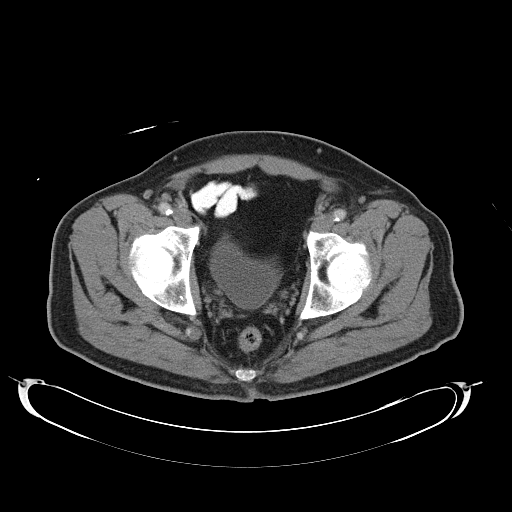
[im 24/90  soft-tissue]
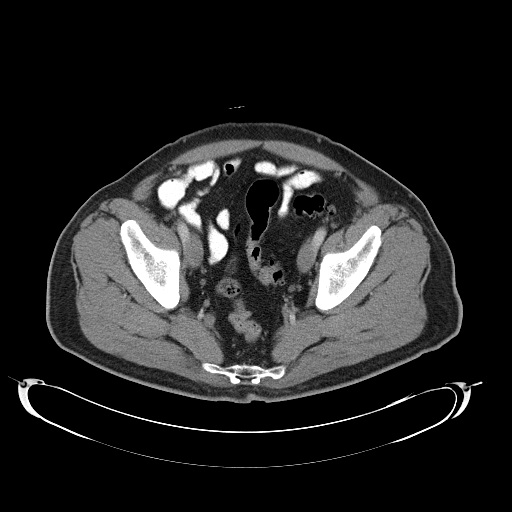
[im 33/90  soft-tissue]
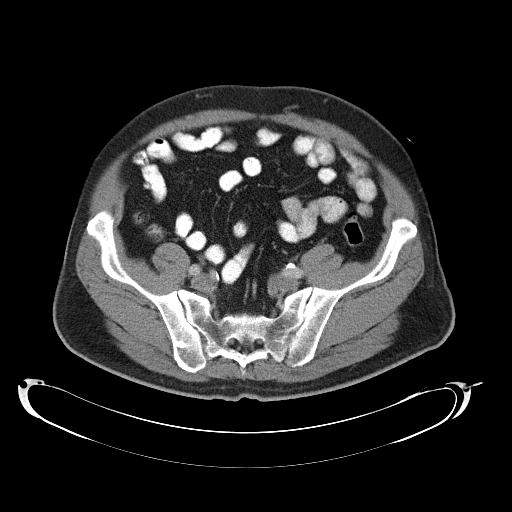
[im 38/90  soft-tissue]
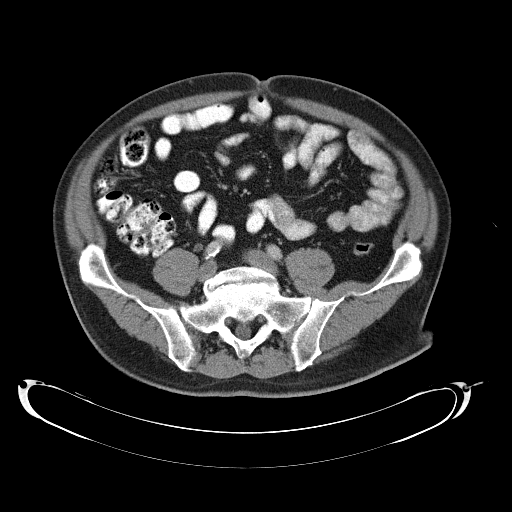
[im 47/90  soft-tissue]
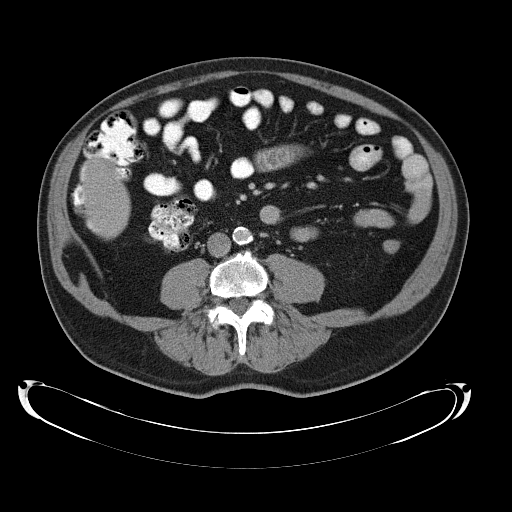
[im 52/90  soft-tissue]
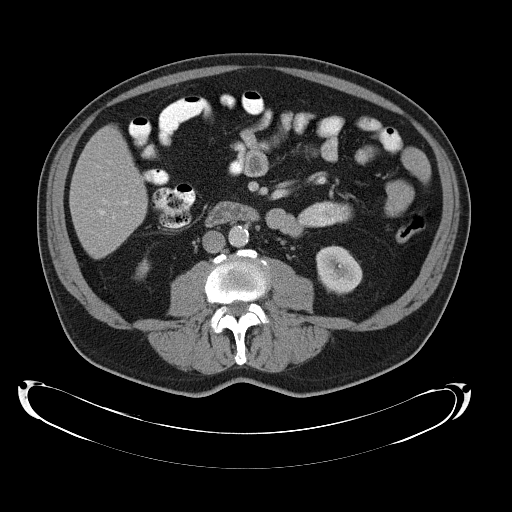
[im 57/90  soft-tissue]
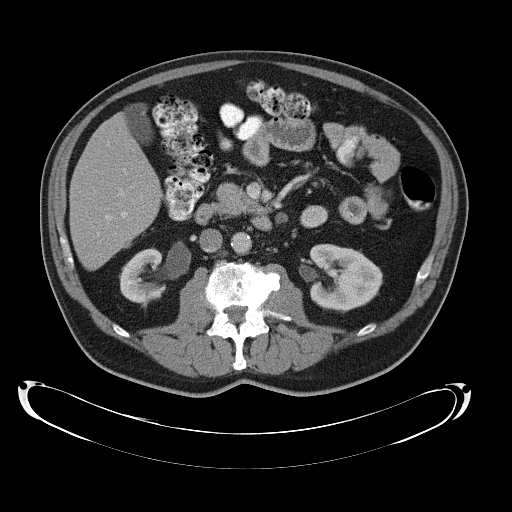
[im 57/90  bone]
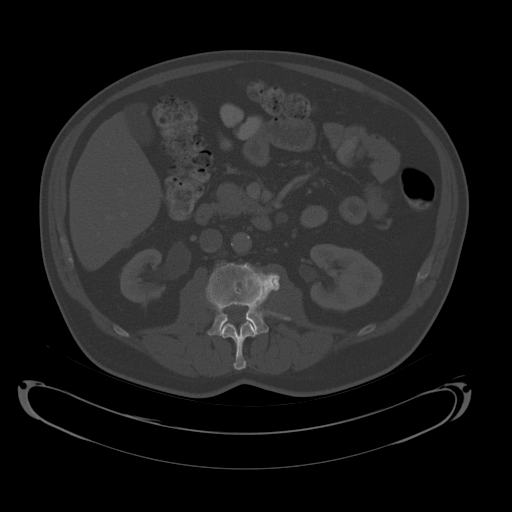
[im 66/90  soft-tissue]
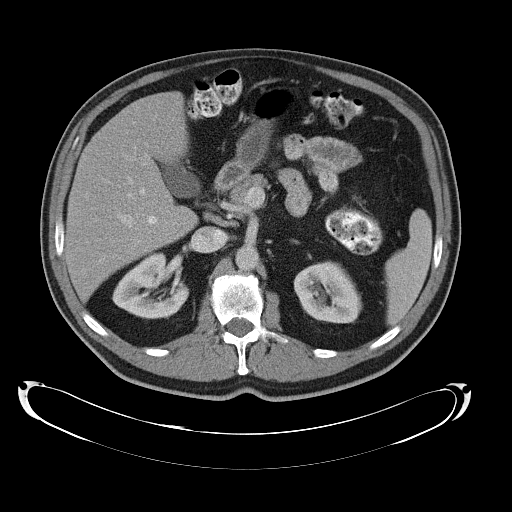
[im 71/90  soft-tissue]
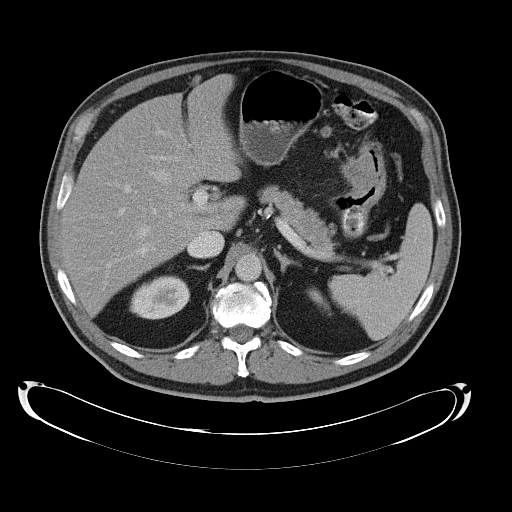
[im 75/90  soft-tissue]
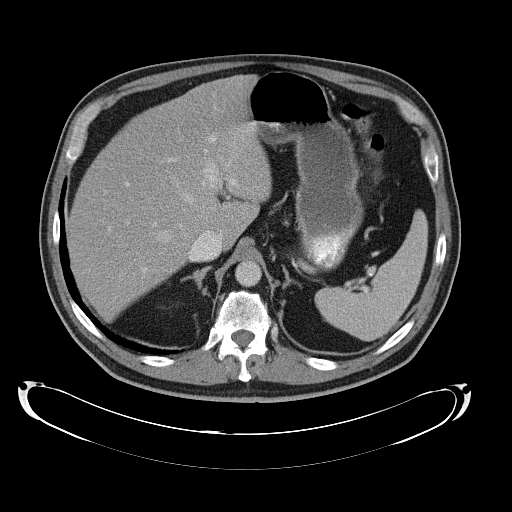
[im 85/90  soft-tissue]
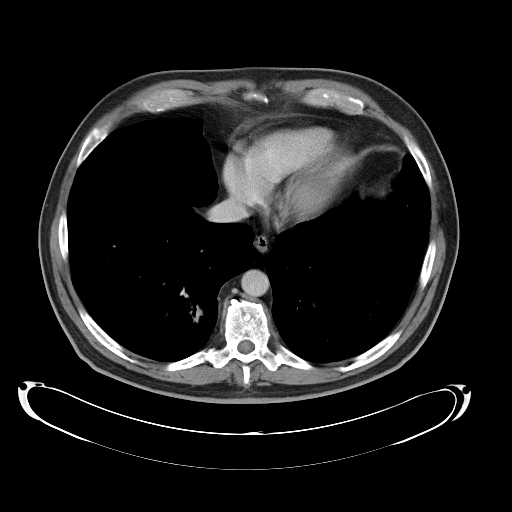

[Series 602: <mpr thick range> · coronal · 0.89mm/px · 3 of 156 slices shown]
[im 52/156  soft-tissue]
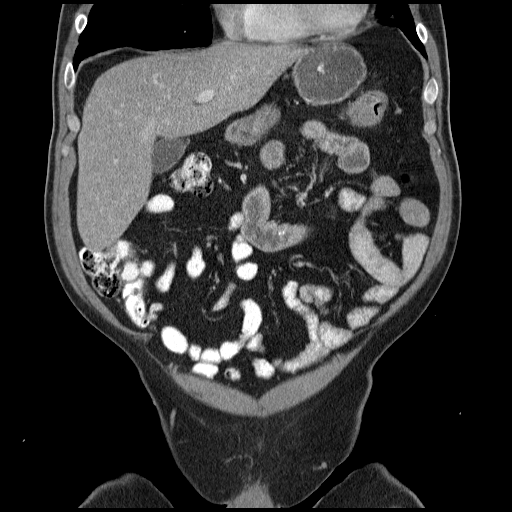
[im 69/156  soft-tissue]
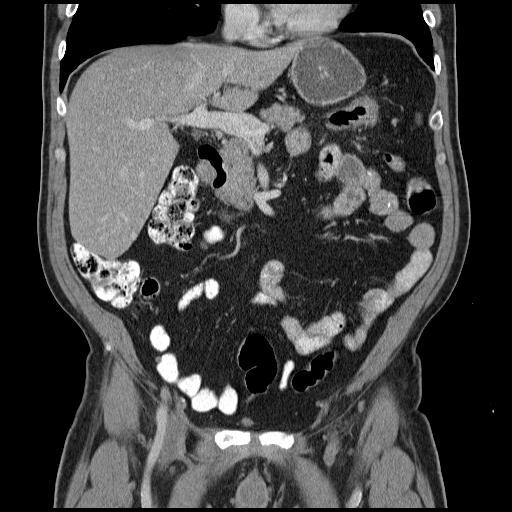
[im 87/156  soft-tissue]
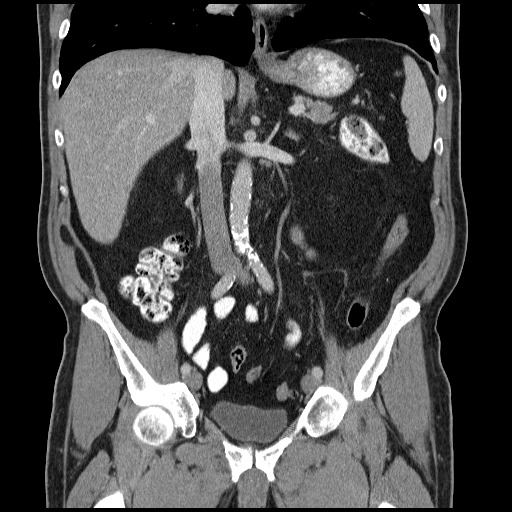

[16 of 46 positions shown; findings below may reference images not displayed]

FINDINGS: The slow growing nodule in the right lower lobe is
unchanged from the recent CT the chest.

A mass along the mesenteric side of the splenic flexure appears to
measure approximately 3.0 x 2.9 cm on image 20 of series 2, similar
to the recent chest CT, and may be associated with circumferential
wall thickening in this vicinity.  The appearance is highly
suspicious for malignancy.  Correlate with the appearance on recent
colonoscopy.

I do not discern any extraluminal gas to suggest perforation.

A lymph node anterior to the lateral segment left hepatic lobe
measures 7 mm in short axis dimension, but was not hypermetabolic
on the prior PET CT from 7443 and was present at that time.  A
smaller nodule along the omentum on image 33 of series 2 eccentric
to the right is nonspecific and measures only about 5 mm.

No definite hepatic masses identified to suggest hepatic metastatic
disease.  A 1.0 cm lymph node just to the left of the stomach body
on image 20 of series 2 is near the suspected colon mass and could
conceivably represent malignancy.

The adrenal glands and pancreas appear normal.  There is a small
diverticulum of the descending duodenum.

The kidneys appear unremarkable, as do the proximal ureters.

A small lipoma is present along the right lateral abdominal wall
musculature.

No pathologic pelvic adenopathy is identified.

Sigmoid diverticulosis is present without active diverticulitis.

Urinary bladder appears unremarkable.

The appendix appears normal.

Bilateral pars defects are present at L5, with grade 1
anterolisthesis of L5 on S1.
IMPRESSION: 1.  Stable appearing eccentric mass along the splenic flexure,
possibly with circumferential thickening of the colon in this
vicinity.  There is a 1 cm adjacent lymph node.  Findings remain
suspicious for malignancy.  No extraluminal gas is evident in this
vicinity.
2.  I do not discern findings suspicious for a hepatic metastatic
lesion at this time.
3.  Stable appearance of slow growing right lower lobe nodule,
suspicious for the possibility of bronchioalveolar cell carcinoma.
4.  Bilateral pars defects at L5 with grade 1 anterolisthesis of L5
on S1.

## 2012-12-18 ENCOUNTER — Other Ambulatory Visit (HOSPITAL_BASED_OUTPATIENT_CLINIC_OR_DEPARTMENT_OTHER): Payer: Medicare Other | Admitting: Lab

## 2012-12-18 ENCOUNTER — Ambulatory Visit (HOSPITAL_BASED_OUTPATIENT_CLINIC_OR_DEPARTMENT_OTHER): Payer: Medicare Other

## 2012-12-18 VITALS — BP 117/70 | HR 83 | Temp 97.0°F

## 2012-12-18 DIAGNOSIS — Z5112 Encounter for antineoplastic immunotherapy: Secondary | ICD-10-CM

## 2012-12-18 DIAGNOSIS — C189 Malignant neoplasm of colon, unspecified: Secondary | ICD-10-CM

## 2012-12-18 DIAGNOSIS — C3490 Malignant neoplasm of unspecified part of unspecified bronchus or lung: Secondary | ICD-10-CM

## 2012-12-18 DIAGNOSIS — C185 Malignant neoplasm of splenic flexure: Secondary | ICD-10-CM

## 2012-12-18 DIAGNOSIS — C801 Malignant (primary) neoplasm, unspecified: Secondary | ICD-10-CM

## 2012-12-18 DIAGNOSIS — C786 Secondary malignant neoplasm of retroperitoneum and peritoneum: Secondary | ICD-10-CM

## 2012-12-18 LAB — CBC WITH DIFFERENTIAL/PLATELET
EOS%: 5.8 % (ref 0.0–7.0)
Eosinophils Absolute: 0.5 10*3/uL (ref 0.0–0.5)
MCV: 92.1 fL (ref 79.3–98.0)
MONO%: 6.6 % (ref 0.0–14.0)
NEUT#: 5.6 10*3/uL (ref 1.5–6.5)
RBC: 4.94 10*6/uL (ref 4.20–5.82)
RDW: 18.1 % — ABNORMAL HIGH (ref 11.0–14.6)
lymph#: 1.4 10*3/uL (ref 0.9–3.3)
nRBC: 0 % (ref 0–0)

## 2012-12-18 LAB — UA PROTEIN, DIPSTICK - CHCC: Protein, ur: 30 mg/dL

## 2012-12-18 MED ORDER — SODIUM CHLORIDE 0.9 % IV SOLN
4.7000 mg/kg | Freq: Once | INTRAVENOUS | Status: AC
  Administered 2012-12-18: 400 mg via INTRAVENOUS
  Filled 2012-12-18: qty 16

## 2012-12-18 NOTE — Patient Instructions (Signed)
[facility] Discharge Instructions for Patients Receiving Chemotherapy  Today you received the following chemotherapy agents Avastin.  To help prevent nausea and vomiting after your treatment, we encourage you to take your nausea medication.   If you develop nausea and vomiting that is not controlled by your nausea medication, call the clinic.   BELOW ARE SYMPTOMS THAT SHOULD BE REPORTED IMMEDIATELY:  *FEVER GREATER THAN 100.5 F  *CHILLS WITH OR WITHOUT FEVER  NAUSEA AND VOMITING THAT IS NOT CONTROLLED WITH YOUR NAUSEA MEDICATION  *UNUSUAL SHORTNESS OF BREATH  *UNUSUAL BRUISING OR BLEEDING  TENDERNESS IN MOUTH AND THROAT WITH OR WITHOUT PRESENCE OF ULCERS  *URINARY PROBLEMS  *BOWEL PROBLEMS  UNUSUAL RASH Items with * indicate a potential emergency and should be followed up as soon as possible.  Feel free to call the clinic you have any questions or concerns. The clinic phone number is [phone].    

## 2012-12-22 ENCOUNTER — Other Ambulatory Visit: Payer: Self-pay | Admitting: Certified Registered Nurse Anesthetist

## 2012-12-22 ENCOUNTER — Other Ambulatory Visit: Payer: Self-pay | Admitting: Dermatology

## 2012-12-24 ENCOUNTER — Other Ambulatory Visit: Payer: Self-pay | Admitting: Oncology

## 2012-12-25 ENCOUNTER — Other Ambulatory Visit: Payer: Self-pay | Admitting: Oncology

## 2012-12-30 ENCOUNTER — Encounter: Payer: Self-pay | Admitting: *Deleted

## 2012-12-30 NOTE — Progress Notes (Signed)
RECEIVED A FAX FROM [employer] CONCERNING A CONFIRMATION OF PRESCRIPTION SHIPMENT FOR XELODA ON 12/29/12. 

## 2012-12-31 ENCOUNTER — Other Ambulatory Visit (HOSPITAL_BASED_OUTPATIENT_CLINIC_OR_DEPARTMENT_OTHER): Payer: Medicare Other | Admitting: Lab

## 2012-12-31 ENCOUNTER — Ambulatory Visit (HOSPITAL_BASED_OUTPATIENT_CLINIC_OR_DEPARTMENT_OTHER): Payer: Medicare Other

## 2012-12-31 VITALS — BP 122/61 | HR 76 | Temp 96.8°F

## 2012-12-31 DIAGNOSIS — C189 Malignant neoplasm of colon, unspecified: Secondary | ICD-10-CM

## 2012-12-31 DIAGNOSIS — C185 Malignant neoplasm of splenic flexure: Secondary | ICD-10-CM

## 2012-12-31 DIAGNOSIS — C786 Secondary malignant neoplasm of retroperitoneum and peritoneum: Secondary | ICD-10-CM

## 2012-12-31 DIAGNOSIS — Z5112 Encounter for antineoplastic immunotherapy: Secondary | ICD-10-CM

## 2012-12-31 LAB — CBC WITH DIFFERENTIAL/PLATELET
BASO%: 0.4 % (ref 0.0–2.0)
EOS%: 5.3 % (ref 0.0–7.0)
HGB: 15.7 g/dL (ref 13.0–17.1)
MCH: 31.8 pg (ref 27.2–33.4)
MCHC: 34.1 g/dL (ref 32.0–36.0)
RDW: 18.5 % — ABNORMAL HIGH (ref 11.0–14.6)
WBC: 8.1 10*3/uL (ref 4.0–10.3)
lymph#: 1.4 10*3/uL (ref 0.9–3.3)

## 2012-12-31 LAB — COMPREHENSIVE METABOLIC PANEL
Alkaline Phosphatase: 50 U/L (ref 39–117)
BUN: 12 mg/dL (ref 6–23)
CO2: 24 mEq/L (ref 19–32)
Glucose, Bld: 95 mg/dL (ref 70–99)
Sodium: 138 mEq/L (ref 135–145)
Total Bilirubin: 0.6 mg/dL (ref 0.3–1.2)
Total Protein: 6.4 g/dL (ref 6.0–8.3)

## 2012-12-31 LAB — CEA: CEA: 7 ng/mL — ABNORMAL HIGH (ref 0.0–5.0)

## 2012-12-31 LAB — UA PROTEIN, DIPSTICK - CHCC: Protein, ur: 30 mg/dL

## 2012-12-31 MED ORDER — SODIUM CHLORIDE 0.9 % IV SOLN
4.7000 mg/kg | Freq: Once | INTRAVENOUS | Status: AC
  Administered 2012-12-31: 400 mg via INTRAVENOUS
  Filled 2012-12-31: qty 16

## 2012-12-31 MED ORDER — SODIUM CHLORIDE 0.9 % IV SOLN
Freq: Once | INTRAVENOUS | Status: AC
  Administered 2012-12-31: 17:00:00 via INTRAVENOUS

## 2012-12-31 NOTE — Patient Instructions (Signed)
[facility] Discharge Instructions for Patients Receiving Chemotherapy  Today you received the following chemotherapy agents avastin  To help prevent nausea and vomiting after your treatment, we encourage you to take your nausea medication as needed  If you develop nausea and vomiting that is not controlled by your nausea medication, call the clinic. If it is after clinic hours your family physician or the after hours number for the clinic or go to the Emergency Department.   BELOW ARE SYMPTOMS THAT SHOULD BE REPORTED IMMEDIATELY:  *FEVER GREATER THAN 100.5 F  *CHILLS WITH OR WITHOUT FEVER  NAUSEA AND VOMITING THAT IS NOT CONTROLLED WITH YOUR NAUSEA MEDICATION  *UNUSUAL SHORTNESS OF BREATH  *UNUSUAL BRUISING OR BLEEDING  TENDERNESS IN MOUTH AND THROAT WITH OR WITHOUT PRESENCE OF ULCERS  *URINARY PROBLEMS  *BOWEL PROBLEMS  UNUSUAL RASH Items with * indicate a potential emergency and should be followed up as soon as possible.  The clinic phone number is [phone].   I have been informed and understand all the instructions given to me. I know to contact the clinic, my physician, or go to the Emergency Department if any problems should occur. I do not have any questions at this time, but understand that I may call the clinic during office hours   should I have any questions or need assistance in obtaining follow up care.    __________________________________________  _____________  __________ Signature of Patient or Authorized Representative            Date                   Time    __________________________________________ Nurse's Signature         

## 2013-01-07 ENCOUNTER — Ambulatory Visit (HOSPITAL_COMMUNITY)
Admission: RE | Admit: 2013-01-07 | Discharge: 2013-01-07 | Disposition: A | Discharge status: Home / Self Care | Payer: Medicare Other | Source: Ambulatory Visit | Attending: Nurse Practitioner | Admitting: Nurse Practitioner

## 2013-01-07 DIAGNOSIS — Z79899 Other long term (current) drug therapy: Secondary | ICD-10-CM | POA: Insufficient documentation

## 2013-01-07 DIAGNOSIS — Z85118 Personal history of other malignant neoplasm of bronchus and lung: Secondary | ICD-10-CM | POA: Insufficient documentation

## 2013-01-07 DIAGNOSIS — J438 Other emphysema: Secondary | ICD-10-CM | POA: Insufficient documentation

## 2013-01-07 DIAGNOSIS — K669 Disorder of peritoneum, unspecified: Secondary | ICD-10-CM | POA: Insufficient documentation

## 2013-01-07 DIAGNOSIS — N400 Enlarged prostate without lower urinary tract symptoms: Secondary | ICD-10-CM | POA: Insufficient documentation

## 2013-01-07 DIAGNOSIS — Q762 Congenital spondylolisthesis: Secondary | ICD-10-CM | POA: Insufficient documentation

## 2013-01-07 DIAGNOSIS — J984 Other disorders of lung: Secondary | ICD-10-CM | POA: Insufficient documentation

## 2013-01-07 DIAGNOSIS — K5730 Diverticulosis of large intestine without perforation or abscess without bleeding: Secondary | ICD-10-CM | POA: Insufficient documentation

## 2013-01-07 DIAGNOSIS — C189 Malignant neoplasm of colon, unspecified: Secondary | ICD-10-CM | POA: Insufficient documentation

## 2013-01-07 MED ORDER — IOHEXOL 300 MG/ML  SOLN
100.0000 mL | Freq: Once | INTRAMUSCULAR | Status: AC | PRN
  Administered 2013-01-07: 100 mL via INTRAVENOUS

## 2013-01-09 ENCOUNTER — Telehealth: Payer: Self-pay | Admitting: Oncology

## 2013-01-09 ENCOUNTER — Ambulatory Visit (HOSPITAL_BASED_OUTPATIENT_CLINIC_OR_DEPARTMENT_OTHER): Payer: Medicare Other | Admitting: Oncology

## 2013-01-09 ENCOUNTER — Telehealth: Payer: Self-pay | Admitting: *Deleted

## 2013-01-09 VITALS — BP 141/74 | HR 78 | Temp 97.7°F | Resp 18 | Ht 67.0 in | Wt 180.5 lb

## 2013-01-09 DIAGNOSIS — C786 Secondary malignant neoplasm of retroperitoneum and peritoneum: Secondary | ICD-10-CM

## 2013-01-09 DIAGNOSIS — C3410 Malignant neoplasm of upper lobe, unspecified bronchus or lung: Secondary | ICD-10-CM

## 2013-01-09 DIAGNOSIS — C3490 Malignant neoplasm of unspecified part of unspecified bronchus or lung: Secondary | ICD-10-CM

## 2013-01-09 DIAGNOSIS — C185 Malignant neoplasm of splenic flexure: Secondary | ICD-10-CM

## 2013-01-09 DIAGNOSIS — C189 Malignant neoplasm of colon, unspecified: Secondary | ICD-10-CM

## 2013-01-09 NOTE — Telephone Encounter (Signed)
Gave pt appt for lab and MD on April 2014, gave pt oral contrast for CT, emailed Marcelino Duster regarding chemo

## 2013-01-09 NOTE — Telephone Encounter (Signed)
Pt notified of CT report per Dr. Cyndie Chime.

## 2013-01-09 NOTE — Telephone Encounter (Signed)
Message copied by Sabino Snipes on Fri Jan 09, 2013  4:30 PM ------      Message from: [name]      Created: Thu Jan 08, 2013 12:50 PM       Call pt: stable changes on CT; no obvious progression

## 2013-01-11 NOTE — Progress Notes (Signed)
Hematology and Oncology Follow Up Visit  [name] [id number] 27-Dec-1940 72 y.o. 01/11/2013 12:07 PM   Principle Diagnosis: Encounter Diagnoses  Name Primary?  . Colon cancer metastasized to multiple sites Yes  . 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 He developed a rise in CEA tumor marker and 2 intra-abdominal soft tissue densities on followup CT scan done in November 2012 while on adjuvant Xeloda chemotherapy. Oxaliplatin and Avastin were added to his regimen. Oxaliplatinum was started 11/26/2011. He had a steady fall in his CEA tumor marker from peak value of 12.6 on 11/02/2011 to 2.3 on, 3.1 on July 10. Unfortunately, he developed cumulative neurotoxicity from the Oxaliplatinum. We were following his symptoms closely but he had a sudden worsening of neuropathy, hands greater than feet, and was having trouble buttoning buttons and uncomfortable dysesthesias. I had to stop the Oxaliplatinum. He continues on Xeloda one week on 1 week off and every two-week Avastin.  .  Of interest is the fact that since we added Avastin to his regimen, the right lower lobe lung lesion has decreased in size and is no longer obvious on CT scans. This represents a second primary lung cancer unrelated to his colon cancer.  He is tolerating the combination of Xeloda and Avastin well and reports no obvious side effects and specifically no mucositis, diarrhea, or hand-foot syndrome.  CEA tumor marker is slowly rising again. Value was 3 on 08/13/2012, 4.9 on October 25, 6.1 on December 11, and 7.0 on January 8. Despite the slow rise in the tumor marker, clinically and radiographically he remains stable including CT scans done in anticipation of today's visit on 01/07/2013 which I personally reviewed with him  today. There are some small, subcentimeter, peritoneal nodules. One of the lesions may be 2 mm larger than on the previous study. The other lesions are the same or not even obvious on the current study. No new pathology in the chest or liver. Second primary lung cancer right lung, which regressed with addition of Avastin to his regimen, has not recurred.   Medications: reviewed  Allergies: No Known Allergies  Review of Systems: Constitutional:   No constitutional symptoms Respiratory: No cough or dyspnea Cardiovascular:  No chest pain or palpitations Gastrointestinal: No abdominal pain or change in bowel habit no hematochezia Genito-Urinary: No urinary tract symptoms Musculoskeletal: No muscle, bone, or joint pain Neurologic: No headache. 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  Physical Exam: Blood pressure 141/74, pulse 78, temperature 97.7 F (36.5 C), temperature source Oral, resp. rate 18, height 5\' 7"  (1.702 m), weight 180 lb 8 oz (81.874 kg). Wt Readings from Last 3 Encounters:  01/09/13 180 lb 8 oz (81.874 kg)  12/05/12 175 lb 8 oz (79.606 kg)  10/24/12 174 lb 4.8 oz (79.062 kg)     General appearance: Well-nourished Caucasian man. Weight up 5 pounds compared with December 2013 HENNT: Pharynx no erythema, exudate, or ulcer Lymph nodes: No adenopathy Breasts: Lungs: Clear to auscultation resonant to percussion Heart: Regular rhythm, no murmur Abdomen: Soft, nontender, no mass, no organomegaly Extremities: No edema, no calf tenderness Vascular: No cyanosis Neurologic: No focal deficit. Motor strength 5 over 5, reflexes 1+ symmetric Skin: No rash or ecchymosis  Lab Results: Lab Results  Component  Value Date   WBC 8.1 12/31/2012   HGB 15.7 12/31/2012   HCT 46.1 12/31/2012   MCV 93.3 12/31/2012   PLT 126* 12/31/2012     Chemistry      Component Value Date/Time   NA 138 12/31/2012 1632   NA 141 12/03/2012 1419     NA 139 10/15/2011 1601   K 4.1 12/31/2012 1632   K 4.6 12/03/2012 1419   K 4.3 10/15/2011 1601   CL 105 12/31/2012 1632   CL 110* 12/03/2012 1419   CL 101 10/15/2011 1601   CO2 24 12/31/2012 1632   CO2 24 12/03/2012 1419   CO2 30 10/15/2011 1601   BUN 12 12/31/2012 1632   BUN 19.0 12/03/2012 1419   BUN 17 10/15/2011 1601   CREATININE 1.08 12/31/2012 1632   CREATININE 1.3 12/03/2012 1419   CREATININE 1.1 10/15/2011 1601      Component Value Date/Time   CALCIUM 10.0 12/31/2012 1632   CALCIUM 9.7 12/03/2012 1419   CALCIUM 9.3 10/15/2011 1601   ALKPHOS 50 12/31/2012 1632   ALKPHOS 60 12/03/2012 1419   ALKPHOS 59 10/15/2011 1601   AST 19 12/31/2012 1632   AST 16 12/03/2012 1419   AST 35 10/15/2011 1601   ALT 14 12/31/2012 1632   ALT 14 12/03/2012 1419   BILITOT 0.6 12/31/2012 1632   BILITOT 1.28* 12/03/2012 1419   BILITOT 1.30 10/15/2011 1601    CEA 7.1-see discussion above   Radiological Studies: Ct Chest W Contrast  01/07/2013  *RADIOLOGY REPORT*  Clinical Data:  Restaging for colon cancer.  The patient received chemotherapy 1 week ago.  The patient has a history of lung cancer diagnosed in 2007.  CT CHEST, ABDOMEN AND PELVIS WITH CONTRAST  Technique:  Multidetector CT imaging of the chest, abdomen and pelvis was performed following the standard protocol during bolus administration of intravenous contrast.  Contrast: OMNIPAQUE IOHEXOL 300 MG/ML  SOLN  Comparison:  CT chest abdomen pelvis with contrast 10/17/2012 and earlier prior CT examinations and PET CTs.  CT CHEST  Findings:  Subcarinal lymph node measures 8.5 mm (previously 9.3 mm).  No enlarging mediastinal, hilar, or axillary lymph nodes. Negative for pleural or pericardial effusion.  Heart size within normal limits.  Moderate to severe centrilobular emphysema with stable postsurgical changes and scarring in the peripheral left upper lobe.  Stable band-like scarring in the peripheral right upper lobe.  The trachea and mainstem bronchi  are patent.  No pulmonary nodule or airspace disease.  No acute or suspicious bony abnormality.  IMPRESSION: 1. Stable chest.  Postoperative changes in the left upper lobe.  No evidence of metastatic disease to the chest. 2.  Emphysema.  CT ABDOMEN AND PELVIS  Findings:  The liver enhances normally.  No focal hepatic lesion. The gallbladder, spleen, adrenal glands, kidneys, and pancreas are stable fissure no acute findings.  Small diverticulum of the second portion of the duodenum is stable.  Stomach within normal limits. Small bowel loops are normal in caliber and demonstrate normal wall thickness.  There is moderate amount of stool throughout the colon. No evidence of colonic obstruction or mass by CT.  Diverticulosis of the distal colon is noted, and uncomplicated.  The urinary bladder is unremarkable.  Mild prostamegaly.  Peritoneal implant along the greater curvature of the stomach measures 12 x 13 mm (previously 12 x 12 mm). Probable peritoneal nodule just anterior to the splenic hilum (previously hypermetabolic on PET) measures 6 mm, stable.  A peritoneal nodule  in the left upper quadrant, just inferior to the distal transverse colon measures 11 mm greatest diameter (previously 8 mm) with suggestion of slight enlargement.  Peritoneal nodule anteriorly just to the right of midline at the level of the gallbladder measures 9 mm (previously 8 mm).  A 5 mm peritoneal nodule in the left pericolic gutter is stable.  No new peritoneal nodules identified.  No retroperitoneal or mesenteric lymphadenopathy is identified.  There are degenerative changes of the lumbar spine.  Grade 1 anterolisthesis of L5 on S1 with bilateral pars defects noted.  No acute or suspicious osseous lesion is identified.  A few tiny sclerotic foci in the pelvis and proximal femurs appear stable over time, likely benign bone islands.  IMPRESSION:  1.  Stable number of peritoneal nodules, suggesting peritoneal metastatic disease.  Two to three of  these may be minimally increased in size compared to the CT of 10/17/2012.  No new peritoneal nodules are identified. 2.  No new abnormality identified in the abdomen pelvis.   Original Report Authenticated By: Britta Mccreedy, M.D.    Ct Abdomen Pelvis W Contrast  01/07/2013  *RADIOLOGY REPORT*  Clinical Data:  Restaging for colon cancer.  The patient received chemotherapy 1 week ago.  The patient has a history of lung cancer diagnosed in 2007.  CT CHEST, ABDOMEN AND PELVIS WITH CONTRAST  Technique:  Multidetector CT imaging of the chest, abdomen and pelvis was performed following the standard protocol during bolus administration of intravenous contrast.  Contrast: OMNIPAQUE IOHEXOL 300 MG/ML  SOLN  Comparison:  CT chest abdomen pelvis with contrast 10/17/2012 and earlier prior CT examinations and PET CTs.  CT CHEST  Findings:  Subcarinal lymph node measures 8.5 mm (previously 9.3 mm).  No enlarging mediastinal, hilar, or axillary lymph nodes. Negative for pleural or pericardial effusion.  Heart size within normal limits.  Moderate to severe centrilobular emphysema with stable postsurgical changes and scarring in the peripheral left upper lobe.  Stable band-like scarring in the peripheral right upper lobe.  The trachea and mainstem bronchi are patent.  No pulmonary nodule or airspace disease.  No acute or suspicious bony abnormality.  IMPRESSION: 1. Stable chest.  Postoperative changes in the left upper lobe.  No evidence of metastatic disease to the chest. 2.  Emphysema.  CT ABDOMEN AND PELVIS  Findings:  The liver enhances normally.  No focal hepatic lesion. The gallbladder, spleen, adrenal glands, kidneys, and pancreas are stable fissure no acute findings.  Small diverticulum of the second portion of the duodenum is stable.  Stomach within normal limits. Small bowel loops are normal in caliber and demonstrate normal wall thickness.  There is moderate amount of stool throughout the colon. No evidence of  colonic obstruction or mass by CT.  Diverticulosis of the distal colon is noted, and uncomplicated.  The urinary bladder is unremarkable.  Mild prostamegaly.  Peritoneal implant along the greater curvature of the stomach measures 12 x 13 mm (previously 12 x 12 mm). Probable peritoneal nodule just anterior to the splenic hilum (previously hypermetabolic on PET) measures 6 mm, stable.  A peritoneal nodule in the left upper quadrant, just inferior to the distal transverse colon measures 11 mm greatest diameter (previously 8 mm) with suggestion of slight enlargement.  Peritoneal nodule anteriorly just to the right of midline at the level of the gallbladder measures 9 mm (previously 8 mm).  A 5 mm peritoneal nodule in the left pericolic gutter is stable.  No new peritoneal nodules  identified.  No retroperitoneal or mesenteric lymphadenopathy is identified.  There are degenerative changes of the lumbar spine.  Grade 1 anterolisthesis of L5 on S1 with bilateral pars defects noted.  No acute or suspicious osseous lesion is identified.  A few tiny sclerotic foci in the pelvis and proximal femurs appear stable over time, likely benign bone islands.  IMPRESSION:  1.  Stable number of peritoneal nodules, suggesting peritoneal metastatic disease.  Two to three of these may be minimally increased in size compared to the CT of 10/17/2012.  No new peritoneal nodules are identified. 2.  No new abnormality identified in the abdomen pelvis.   Original Report Authenticated By: Britta Mccreedy, M.D.     Impression and Plan: #1. 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  He has persistent but unchanged subcentimeter peritoneal nodules. Although his CEA is slowly rising again there is no gross evidence for progressive disease on scans or clinically.  Plan: I'm going to continue the current regimen of oral Xeloda plus every 2 weekly Avastin for now.  If persistent rise in CEA or other radiographic changes to suggest progression, I will  add Irinotecan  to his regimen. .  #2. Status post resection of a stage I non-small cell cancer left upper lung with postop brachitherapy June 2007.  #3. Vague, alveolar density right lower lung felt to be a second primary bronchioloalveolar lung cancer found at the same time as the initial cancer in the left upper lung no longer visible on current CT and likely represents therapeutic effect of the Avastin.  #4. Oxaliplatinum related neuropathy  #5. Type 2 diabetes  #6. History of nephrolithiasis  #7. Status post bilateral corneal transplants    CC:. Dr. Rodrigo Ran; Dr. Forde Radon, MD 1/19/201412:07 PM

## 2013-01-12 ENCOUNTER — Telehealth: Payer: Self-pay | Admitting: *Deleted

## 2013-01-12 ENCOUNTER — Other Ambulatory Visit: Payer: Self-pay | Admitting: Oncology

## 2013-01-12 DIAGNOSIS — C189 Malignant neoplasm of colon, unspecified: Secondary | ICD-10-CM

## 2013-01-12 NOTE — Telephone Encounter (Signed)
Per staff message and POF I have scheduled appts.  JMW  

## 2013-01-13 ENCOUNTER — Telehealth: Payer: Self-pay | Admitting: Oncology

## 2013-01-13 NOTE — Telephone Encounter (Signed)
Talked to patient and .gave him appt for February 2014 lab, ML and chemo

## 2013-01-14 ENCOUNTER — Other Ambulatory Visit (HOSPITAL_BASED_OUTPATIENT_CLINIC_OR_DEPARTMENT_OTHER): Payer: Medicare Other | Admitting: Lab

## 2013-01-14 ENCOUNTER — Ambulatory Visit (HOSPITAL_BASED_OUTPATIENT_CLINIC_OR_DEPARTMENT_OTHER): Payer: Medicare Other

## 2013-01-14 VITALS — BP 140/69 | HR 75 | Temp 96.8°F

## 2013-01-14 DIAGNOSIS — C3410 Malignant neoplasm of upper lobe, unspecified bronchus or lung: Secondary | ICD-10-CM

## 2013-01-14 DIAGNOSIS — Z5112 Encounter for antineoplastic immunotherapy: Secondary | ICD-10-CM

## 2013-01-14 DIAGNOSIS — C189 Malignant neoplasm of colon, unspecified: Secondary | ICD-10-CM

## 2013-01-14 DIAGNOSIS — C786 Secondary malignant neoplasm of retroperitoneum and peritoneum: Secondary | ICD-10-CM

## 2013-01-14 DIAGNOSIS — C3490 Malignant neoplasm of unspecified part of unspecified bronchus or lung: Secondary | ICD-10-CM

## 2013-01-14 DIAGNOSIS — C185 Malignant neoplasm of splenic flexure: Secondary | ICD-10-CM

## 2013-01-14 LAB — CBC WITH DIFFERENTIAL/PLATELET
BASO%: 0.6 % (ref 0.0–2.0)
HCT: 49.5 % (ref 38.4–49.9)
MCHC: 34.9 g/dL (ref 32.0–36.0)
MONO#: 0.5 10*3/uL (ref 0.1–0.9)
NEUT%: 68.7 % (ref 39.0–75.0)
RBC: 5.38 10*6/uL (ref 4.20–5.82)
RDW: 18.7 % — ABNORMAL HIGH (ref 11.0–14.6)
WBC: 8.9 10*3/uL (ref 4.0–10.3)
lymph#: 1.7 10*3/uL (ref 0.9–3.3)
nRBC: 0 % (ref 0–0)

## 2013-01-14 LAB — UA PROTEIN, DIPSTICK - CHCC: Protein, ur: 30 mg/dL

## 2013-01-14 MED ORDER — SODIUM CHLORIDE 0.9 % IV SOLN
Freq: Once | INTRAVENOUS | Status: AC
  Administered 2013-01-14: 16:00:00 via INTRAVENOUS

## 2013-01-14 MED ORDER — SODIUM CHLORIDE 0.9 % IV SOLN
4.7000 mg/kg | Freq: Once | INTRAVENOUS | Status: AC
  Administered 2013-01-14: 400 mg via INTRAVENOUS
  Filled 2013-01-14: qty 16

## 2013-01-14 NOTE — Patient Instructions (Signed)
[facility] Discharge Instructions for Patients Receiving Chemotherapy  Today you received the following chemotherapy agents Avastin  To help prevent nausea and vomiting after your treatment, we encourage you to take your nausea medication as prescribed.   If you develop nausea and vomiting that is not controlled by your nausea medication, call the clinic. If it is after clinic hours your family physician or the after hours number for the clinic or go to the Emergency Department.   BELOW ARE SYMPTOMS THAT SHOULD BE REPORTED IMMEDIATELY:  *FEVER GREATER THAN 100.5 F  *CHILLS WITH OR WITHOUT FEVER  NAUSEA AND VOMITING THAT IS NOT CONTROLLED WITH YOUR NAUSEA MEDICATION  *UNUSUAL SHORTNESS OF BREATH  *UNUSUAL BRUISING OR BLEEDING  TENDERNESS IN MOUTH AND THROAT WITH OR WITHOUT PRESENCE OF ULCERS  *URINARY PROBLEMS  *BOWEL PROBLEMS  UNUSUAL RASH Items with * indicate a potential emergency and should be followed up as soon as possible.  Feel free to call the clinic you have any questions or concerns. The clinic phone number is [phone].   I have been informed and understand all the instructions given to me. I know to contact the clinic, my physician, or go to the Emergency Department if any problems should occur. I do not have any questions at this time, but understand that I may call the clinic during office hours   should I have any questions or need assistance in obtaining follow up care.    __________________________________________  _____________  __________ Signature of Patient or Authorized Representative            Date                   Time    __________________________________________ Nurse's Signature    

## 2013-01-22 ENCOUNTER — Encounter: Payer: Self-pay | Admitting: Gastroenterology

## 2013-01-26 ENCOUNTER — Telehealth: Payer: Self-pay | Admitting: Gastroenterology

## 2013-01-26 NOTE — Telephone Encounter (Signed)
Informed pt the Recall letter mailed did not take into account his COLON in 2012. When he has completed his chemo or when Dr Cyndie Chime decides, we will repeat his Colonoscopy. Pt stated understanding.

## 2013-01-27 ENCOUNTER — Encounter: Payer: Self-pay | Admitting: *Deleted

## 2013-01-27 NOTE — Progress Notes (Signed)
RECEIVED A FAX FROM [employer] CONCERNING A CONFIRMATION OF PRESCRIPTION SHIPMENT FOR XELODA ON 01/23/13. 

## 2013-01-28 ENCOUNTER — Other Ambulatory Visit (HOSPITAL_BASED_OUTPATIENT_CLINIC_OR_DEPARTMENT_OTHER): Payer: Medicare Other | Admitting: Lab

## 2013-01-28 ENCOUNTER — Ambulatory Visit (HOSPITAL_BASED_OUTPATIENT_CLINIC_OR_DEPARTMENT_OTHER): Payer: Medicare Other

## 2013-01-28 VITALS — BP 124/70 | HR 77 | Temp 97.0°F

## 2013-01-28 DIAGNOSIS — C786 Secondary malignant neoplasm of retroperitoneum and peritoneum: Secondary | ICD-10-CM

## 2013-01-28 DIAGNOSIS — C185 Malignant neoplasm of splenic flexure: Secondary | ICD-10-CM

## 2013-01-28 DIAGNOSIS — Z5112 Encounter for antineoplastic immunotherapy: Secondary | ICD-10-CM

## 2013-01-28 DIAGNOSIS — C189 Malignant neoplasm of colon, unspecified: Secondary | ICD-10-CM

## 2013-01-28 DIAGNOSIS — C3490 Malignant neoplasm of unspecified part of unspecified bronchus or lung: Secondary | ICD-10-CM

## 2013-01-28 LAB — CBC WITH DIFFERENTIAL/PLATELET
BASO%: 0.4 % (ref 0.0–2.0)
Eosinophils Absolute: 0.5 10*3/uL (ref 0.0–0.5)
MONO#: 0.5 10*3/uL (ref 0.1–0.9)
NEUT#: 5.3 10*3/uL (ref 1.5–6.5)
RBC: 4.89 10*6/uL (ref 4.20–5.82)
RDW: 18.8 % — ABNORMAL HIGH (ref 11.0–14.6)
WBC: 8 10*3/uL (ref 4.0–10.3)
nRBC: 0 % (ref 0–0)

## 2013-01-28 LAB — UA PROTEIN, DIPSTICK - CHCC: Protein, ur: <30 mg/dL

## 2013-01-28 MED ORDER — BEVACIZUMAB CHEMO INJECTION 400 MG/16ML
4.7000 mg/kg | Freq: Once | INTRAVENOUS | Status: AC
  Administered 2013-01-28: 400 mg via INTRAVENOUS
  Filled 2013-01-28: qty 16

## 2013-01-28 MED ORDER — SODIUM CHLORIDE 0.9 % IV SOLN
Freq: Once | INTRAVENOUS | Status: AC
  Administered 2013-01-28: 16:00:00 via INTRAVENOUS

## 2013-01-28 NOTE — Patient Instructions (Addendum)
[facility] Discharge Instructions for Patients Receiving Chemotherapy  Today you received the following chemotherapy agents Avastin.  To help prevent nausea and vomiting after your treatment, we encourage you to take your nausea medication as prescribed.   If you develop nausea and vomiting that is not controlled by your nausea medication, call the clinic. If it is after clinic hours your family physician or the after hours number for the clinic or go to the Emergency Department.   BELOW ARE SYMPTOMS THAT SHOULD BE REPORTED IMMEDIATELY:  *FEVER GREATER THAN 100.5 F  *CHILLS WITH OR WITHOUT FEVER  NAUSEA AND VOMITING THAT IS NOT CONTROLLED WITH YOUR NAUSEA MEDICATION  *UNUSUAL SHORTNESS OF BREATH  *UNUSUAL BRUISING OR BLEEDING  TENDERNESS IN MOUTH AND THROAT WITH OR WITHOUT PRESENCE OF ULCERS  *URINARY PROBLEMS  *BOWEL PROBLEMS  UNUSUAL RASH Items with * indicate a potential emergency and should be followed up as soon as possible.  One of the nurses will contact you 24 hours after your treatment. Please let the nurse know about any problems that you may have experienced. Feel free to call the clinic you have any questions or concerns. The clinic phone number is [phone].     

## 2013-02-07 ENCOUNTER — Other Ambulatory Visit: Payer: Self-pay

## 2013-02-11 ENCOUNTER — Other Ambulatory Visit: Payer: Medicare Other | Admitting: Lab

## 2013-02-11 ENCOUNTER — Ambulatory Visit (HOSPITAL_BASED_OUTPATIENT_CLINIC_OR_DEPARTMENT_OTHER): Payer: Medicare Other

## 2013-02-11 VITALS — BP 139/78 | HR 83 | Temp 98.3°F

## 2013-02-11 DIAGNOSIS — C786 Secondary malignant neoplasm of retroperitoneum and peritoneum: Secondary | ICD-10-CM

## 2013-02-11 DIAGNOSIS — Z5112 Encounter for antineoplastic immunotherapy: Secondary | ICD-10-CM

## 2013-02-11 DIAGNOSIS — C189 Malignant neoplasm of colon, unspecified: Secondary | ICD-10-CM

## 2013-02-11 DIAGNOSIS — C3490 Malignant neoplasm of unspecified part of unspecified bronchus or lung: Secondary | ICD-10-CM

## 2013-02-11 DIAGNOSIS — C185 Malignant neoplasm of splenic flexure: Secondary | ICD-10-CM

## 2013-02-11 LAB — CBC WITH DIFFERENTIAL/PLATELET
BASO%: 0.4 % (ref 0.0–2.0)
EOS%: 5.2 % (ref 0.0–7.0)
LYMPH%: 17.7 % (ref 14.0–49.0)
MCH: 32.2 pg (ref 27.2–33.4)
MCHC: 35.1 g/dL (ref 32.0–36.0)
MCV: 91.7 fL (ref 79.3–98.0)
MONO%: 6.4 % (ref 0.0–14.0)
Platelets: 131 10*3/uL — ABNORMAL LOW (ref 140–400)
RBC: 5.19 10*6/uL (ref 4.20–5.82)

## 2013-02-11 LAB — UA PROTEIN, DIPSTICK - CHCC: Protein, ur: 30 mg/dL

## 2013-02-11 MED ORDER — SODIUM CHLORIDE 0.9 % IV SOLN
4.7000 mg/kg | Freq: Once | INTRAVENOUS | Status: AC
  Administered 2013-02-11: 400 mg via INTRAVENOUS
  Filled 2013-02-11: qty 16

## 2013-02-11 MED ORDER — SODIUM CHLORIDE 0.9 % IV SOLN
Freq: Once | INTRAVENOUS | Status: AC
  Administered 2013-02-11: 16:00:00 via INTRAVENOUS

## 2013-02-11 NOTE — Patient Instructions (Signed)
[facility] Discharge Instructions for Patients Receiving Chemotherapy  Today you received the following chemotherapy agents Avastin To help prevent nausea and vomiting after your treatment, we encourage you to take your nausea medication as prescribed.  If you develop nausea and vomiting that is not controlled by your nausea medication, call the clinic. If it is after clinic hours your family physician or the after hours number for the clinic or go to the Emergency Department.   BELOW ARE SYMPTOMS THAT SHOULD BE REPORTED IMMEDIATELY:  *FEVER GREATER THAN 100.5 F  *CHILLS WITH OR WITHOUT FEVER  NAUSEA AND VOMITING THAT IS NOT CONTROLLED WITH YOUR NAUSEA MEDICATION  *UNUSUAL SHORTNESS OF BREATH  *UNUSUAL BRUISING OR BLEEDING  TENDERNESS IN MOUTH AND THROAT WITH OR WITHOUT PRESENCE OF ULCERS  *URINARY PROBLEMS  *BOWEL PROBLEMS  UNUSUAL RASH Items with * indicate a potential emergency and should be followed up as soon as possible.  One of the nurses will contact you 24 hours after your treatment. Please let the nurse know about any problems that you may have experienced. Feel free to call the clinic you have any questions or concerns. The clinic phone number is [phone].   I have been informed and understand all the instructions given to me. I know to contact the clinic, my physician, or go to the Emergency Department if any problems should occur. I do not have any questions at this time, but understand that I may call the clinic during office hours   should I have any questions or need assistance in obtaining follow up care.    __________________________________________  _____________  __________ Signature of Patient or Authorized Representative            Date                   Time    __________________________________________ Nurse's Signature    

## 2013-02-20 ENCOUNTER — Ambulatory Visit (HOSPITAL_BASED_OUTPATIENT_CLINIC_OR_DEPARTMENT_OTHER): Payer: Medicare Other | Admitting: Nurse Practitioner

## 2013-02-20 ENCOUNTER — Telehealth: Payer: Self-pay | Admitting: Oncology

## 2013-02-20 ENCOUNTER — Other Ambulatory Visit: Payer: Self-pay | Admitting: Oncology

## 2013-02-20 ENCOUNTER — Telehealth: Payer: Self-pay | Admitting: *Deleted

## 2013-02-20 ENCOUNTER — Other Ambulatory Visit (HOSPITAL_BASED_OUTPATIENT_CLINIC_OR_DEPARTMENT_OTHER): Payer: Medicare Other

## 2013-02-20 VITALS — BP 129/83 | HR 83 | Temp 96.7°F | Resp 18 | Ht 67.0 in | Wt 183.8 lb

## 2013-02-20 DIAGNOSIS — C189 Malignant neoplasm of colon, unspecified: Secondary | ICD-10-CM

## 2013-02-20 DIAGNOSIS — C185 Malignant neoplasm of splenic flexure: Secondary | ICD-10-CM

## 2013-02-20 DIAGNOSIS — C786 Secondary malignant neoplasm of retroperitoneum and peritoneum: Secondary | ICD-10-CM

## 2013-02-20 DIAGNOSIS — G620 Drug-induced polyneuropathy: Secondary | ICD-10-CM

## 2013-02-20 DIAGNOSIS — C3410 Malignant neoplasm of upper lobe, unspecified bronchus or lung: Secondary | ICD-10-CM

## 2013-02-20 LAB — COMPREHENSIVE METABOLIC PANEL (CC13)
ALT: 13 U/L (ref 0–55)
AST: 15 U/L (ref 5–34)
Albumin: 3.4 g/dL — ABNORMAL LOW (ref 3.5–5.0)
CO2: 25 mEq/L (ref 22–29)
Calcium: 10.2 mg/dL (ref 8.4–10.4)
Chloride: 106 mEq/L (ref 98–107)
Creatinine: 1.2 mg/dL (ref 0.7–1.3)
Potassium: 4.5 mEq/L (ref 3.5–5.1)
Total Protein: 6.5 g/dL (ref 6.4–8.3)

## 2013-02-20 LAB — CBC WITH DIFFERENTIAL/PLATELET
BASO%: 0.5 % (ref 0.0–2.0)
Eosinophils Absolute: 0.5 10*3/uL (ref 0.0–0.5)
HCT: 48.5 % (ref 38.4–49.9)
LYMPH%: 16.7 % (ref 14.0–49.0)
MONO#: 0.7 10*3/uL (ref 0.1–0.9)
NEUT#: 7.3 10*3/uL — ABNORMAL HIGH (ref 1.5–6.5)
NEUT%: 71.7 % (ref 39.0–75.0)
Platelets: 155 10*3/uL (ref <2.0)
RBC: 5.2 10*6/uL (ref 4.20–5.82)
WBC: 10.1 10*3/uL (ref 4.0–10.3)
lymph#: 1.7 10*3/uL (ref 0.9–3.3)

## 2013-02-20 LAB — CEA: CEA: 11.3 ng/mL — ABNORMAL HIGH (ref 0.0–5.0)

## 2013-02-20 NOTE — Telephone Encounter (Signed)
gv and printed appt schedule for pt for March and April °

## 2013-02-20 NOTE — Telephone Encounter (Signed)
Per staff phone call and POF I have schedueld appts.  JMW  

## 2013-02-20 NOTE — Progress Notes (Signed)
OFFICE PROGRESS NOTE  Interval history:  [name] is a 72 year old man with local intra-abdominal recurrence of colon cancer. He was initially diagnosed with stage III colon cancer in July 2012 status post left colectomy 07/09/2011. He developed a rise in the CEA tumor marker and 2 intra-abdominal soft tissue densities on followup CT scan in November 2012 while on adjuvant Xeloda chemotherapy. Oxaliplatin and Avastin were added to the regimen with a steady fall in the CEA. He developed cumulative neurotoxicity related to the oxaliplatin necessitating discontinuation. Xeloda was continued on a 1 week on/1 week off schedule and Avastin was continued every 2 weeks.  The CEA has been slowly rising. Most recent CEA on 12/31/2002 2 returned at 7.0 as compared to 6.1 on 12/03/2012 and 4.9 on 10/17/2012.  Most recent restaging CT evaluation 01/07/2013 showed a stable number of peritoneal nodules with 2-3 of the nodules possibly minimally increased in size when compared to the CT 10/17/2012. No new peritoneal nodules were identified. There was no evidence of metastatic disease to the chest.  He continues treatment with Xeloda 1 week on/1 week off and Avastin every 2 weeks. He is seen today for scheduled followup.  Mr. Bloyd reports that overall he feels well. He denies nausea/vomiting. No mouth sores. No diarrhea. No hand or foot pain or redness. He denies bleeding. No shortness of breath. No chest pain. No abdominal pain. He denies leg swelling or calf pain. He has stable mild numbness at the fingertips and the soles of the feet.    Objective: Blood pressure 129/83, pulse 83, temperature 96.7 F (35.9 C), temperature source Oral, resp. rate 18, height 5\' 7"  (1.702 m), weight 183 lb 12.8 oz (83.371 kg).  Oropharynx is without thrush or ulceration. No palpable cervical, supraclavicular or axillary lymph nodes. Lungs are clear. No wheezes or rales. Regular cardiac rhythm. Abdomen is soft and nontender. No  organomegaly. Extremities are without edema. Motor strength 5 over 5. Palms are nontender and without erythema.  Lab Results: Lab Results  Component Value Date   WBC 10.1 02/20/2013   HGB 16.9 02/20/2013   HCT 48.5 02/20/2013   MCV 93.3 02/20/2013   PLT 155 02/20/2013    Chemistry:    Chemistry      Component Value Date/Time   NA 139 02/20/2013 1419   NA 138 12/31/2012 1632   NA 139 10/15/2011 1601   K 4.5 02/20/2013 1419   K 4.1 12/31/2012 1632   K 4.3 10/15/2011 1601   CL 106 02/20/2013 1419   CL 105 12/31/2012 1632   CL 101 10/15/2011 1601   CO2 25 02/20/2013 1419   CO2 24 12/31/2012 1632   CO2 30 10/15/2011 1601   BUN 11.4 02/20/2013 1419   BUN 12 12/31/2012 1632   BUN 17 10/15/2011 1601   CREATININE 1.2 02/20/2013 1419   CREATININE 1.08 12/31/2012 1632   CREATININE 1.1 10/15/2011 1601      Component Value Date/Time   CALCIUM 10.2 02/20/2013 1419   CALCIUM 10.0 12/31/2012 1632   CALCIUM 9.3 10/15/2011 1601   ALKPHOS 59 02/20/2013 1419   ALKPHOS 50 12/31/2012 1632   ALKPHOS 59 10/15/2011 1601   AST 15 02/20/2013 1419   AST 19 12/31/2012 1632   AST 35 10/15/2011 1601   ALT 13 02/20/2013 1419   ALT 14 12/31/2012 1632   BILITOT 1.09 02/20/2013 1419   BILITOT 0.6 12/31/2012 1632   BILITOT 1.30 10/15/2011 1601       Studies/Results: No results found.  Medications: I have reviewed the patient's current medications.  Assessment/Plan:  1. Colon cancer metastatic to peritoneum currently on active treatment with Xeloda 2000 mg twice daily 7 days on/7 days off and Avastin every 2 weeks. 2. Status post resection of a stage I non-small cell lung cancer left upper lung with postop brachytherapy June 2007. 3. 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 The density was not visible on recent restaging CT evaluation. 4. Oxaliplatin neuropathy. Stable. 5. Type 2 diabetes. 6. History  of nephrolithiasis. 7. Status post bilateral corneal transplants.   Disposition-Mr. Gallardo appears stable. We will followup on the CEA from today. For now he will continue Xeloda and Avastin on the current schedule. Next restaging CT evaluation is scheduled 04/08/2013. He has a followup visit with Dr. Cyndie Chime on 04/13/2013. He will contact the office in the interim with any problems.  Lonna Cobb ANP/GNP-BC

## 2013-02-23 ENCOUNTER — Encounter: Payer: Self-pay | Admitting: *Deleted

## 2013-02-23 NOTE — Progress Notes (Signed)
RECEIVED A FAX FROM [employer] CONCERNING A CONFIRMATION OF PRESCRIPTION SHIPMENT FOR XELODA ON 02/20/13.

## 2013-02-25 ENCOUNTER — Other Ambulatory Visit (HOSPITAL_BASED_OUTPATIENT_CLINIC_OR_DEPARTMENT_OTHER): Payer: Medicare Other

## 2013-02-25 ENCOUNTER — Ambulatory Visit (HOSPITAL_BASED_OUTPATIENT_CLINIC_OR_DEPARTMENT_OTHER): Payer: Medicare Other

## 2013-02-25 VITALS — BP 125/74 | HR 77 | Temp 97.4°F

## 2013-02-25 DIAGNOSIS — C786 Secondary malignant neoplasm of retroperitoneum and peritoneum: Secondary | ICD-10-CM

## 2013-02-25 DIAGNOSIS — C185 Malignant neoplasm of splenic flexure: Secondary | ICD-10-CM

## 2013-02-25 DIAGNOSIS — Z5112 Encounter for antineoplastic immunotherapy: Secondary | ICD-10-CM

## 2013-02-25 DIAGNOSIS — C3490 Malignant neoplasm of unspecified part of unspecified bronchus or lung: Secondary | ICD-10-CM

## 2013-02-25 DIAGNOSIS — C189 Malignant neoplasm of colon, unspecified: Secondary | ICD-10-CM

## 2013-02-25 LAB — CEA: CEA: 10.5 ng/mL — ABNORMAL HIGH (ref 0.0–5.0)

## 2013-02-25 LAB — CBC WITH DIFFERENTIAL/PLATELET
BASO%: 0.2 % (ref 0.0–2.0)
EOS%: 5.2 % (ref 0.0–7.0)
HCT: 49.5 % (ref 38.4–49.9)
MCH: 31.9 pg (ref 27.2–33.4)
MCHC: 34.1 g/dL (ref 32.0–36.0)
MONO#: 0.6 10*3/uL (ref 0.1–0.9)
NEUT%: 70.5 % (ref 39.0–75.0)
RBC: 5.3 10*6/uL (ref 4.20–5.82)
RDW: 18.7 % — ABNORMAL HIGH (ref 11.0–14.6)
WBC: 9.2 10*3/uL (ref 4.0–10.3)
lymph#: 1.7 10*3/uL (ref 0.9–3.3)
nRBC: 0 % (ref 0–0)

## 2013-02-25 LAB — COMPREHENSIVE METABOLIC PANEL (CC13)
Alkaline Phosphatase: 62 U/L (ref 40–150)
Glucose: 100 mg/dl — ABNORMAL HIGH (ref 70–99)
Sodium: 141 mEq/L (ref 136–145)
Total Bilirubin: 1.37 mg/dL — ABNORMAL HIGH (ref 0.20–1.20)
Total Protein: 7 g/dL (ref 6.4–8.3)

## 2013-02-25 MED ORDER — SODIUM CHLORIDE 0.9 % IV SOLN
Freq: Once | INTRAVENOUS | Status: AC
  Administered 2013-02-25: 16:00:00 via INTRAVENOUS

## 2013-02-25 MED ORDER — SODIUM CHLORIDE 0.9 % IV SOLN
4.7000 mg/kg | Freq: Once | INTRAVENOUS | Status: AC
  Administered 2013-02-25: 400 mg via INTRAVENOUS
  Filled 2013-02-25: qty 16

## 2013-02-25 NOTE — Patient Instructions (Addendum)
[facility] Discharge Instructions for Patients Receiving Chemotherapy  Today you received the following chemotherapy agents Avastin.  To help prevent nausea and vomiting after your treatment, we encourage you to take your nausea medication as prescribed.   If you develop nausea and vomiting that is not controlled by your nausea medication, call the clinic. If it is after clinic hours your family physician or the after hours number for the clinic or go to the Emergency Department.   BELOW ARE SYMPTOMS THAT SHOULD BE REPORTED IMMEDIATELY:  *FEVER GREATER THAN 100.5 F  *CHILLS WITH OR WITHOUT FEVER  NAUSEA AND VOMITING THAT IS NOT CONTROLLED WITH YOUR NAUSEA MEDICATION  *UNUSUAL SHORTNESS OF BREATH  *UNUSUAL BRUISING OR BLEEDING  TENDERNESS IN MOUTH AND THROAT WITH OR WITHOUT PRESENCE OF ULCERS  *URINARY PROBLEMS  *BOWEL PROBLEMS  UNUSUAL RASH Items with * indicate a potential emergency and should be followed up as soon as possible.  One of the nurses will contact you 24 hours after your treatment. Please let the nurse know about any problems that you may have experienced. Feel free to call the clinic you have any questions or concerns. The clinic phone number is [phone].     

## 2013-03-11 ENCOUNTER — Ambulatory Visit (HOSPITAL_BASED_OUTPATIENT_CLINIC_OR_DEPARTMENT_OTHER): Payer: Medicare Other

## 2013-03-11 ENCOUNTER — Other Ambulatory Visit (HOSPITAL_BASED_OUTPATIENT_CLINIC_OR_DEPARTMENT_OTHER): Payer: Medicare Other | Admitting: Lab

## 2013-03-11 DIAGNOSIS — C3490 Malignant neoplasm of unspecified part of unspecified bronchus or lung: Secondary | ICD-10-CM

## 2013-03-11 DIAGNOSIS — C189 Malignant neoplasm of colon, unspecified: Secondary | ICD-10-CM

## 2013-03-11 DIAGNOSIS — Z5112 Encounter for antineoplastic immunotherapy: Secondary | ICD-10-CM

## 2013-03-11 DIAGNOSIS — C185 Malignant neoplasm of splenic flexure: Secondary | ICD-10-CM

## 2013-03-11 DIAGNOSIS — C786 Secondary malignant neoplasm of retroperitoneum and peritoneum: Secondary | ICD-10-CM

## 2013-03-11 LAB — CBC WITH DIFFERENTIAL/PLATELET
BASO%: 0.5 % (ref 0.0–2.0)
Basophils Absolute: 0 10*3/uL (ref 0.0–0.1)
Eosinophils Absolute: 0.4 10*3/uL (ref 0.0–0.5)
HCT: 48.2 % (ref 38.4–49.9)
LYMPH%: 16.5 % (ref 14.0–49.0)
MCHC: 34.4 g/dL (ref 32.0–36.0)
MONO#: 0.5 10*3/uL (ref 0.1–0.9)
NEUT%: 72.9 % (ref 39.0–75.0)
Platelets: 128 10*3/uL — ABNORMAL LOW (ref 140–400)
WBC: 8.9 10*3/uL (ref 4.0–10.3)

## 2013-03-11 MED ORDER — SODIUM CHLORIDE 0.9 % IV SOLN
Freq: Once | INTRAVENOUS | Status: AC
  Administered 2013-03-11: 16:00:00 via INTRAVENOUS

## 2013-03-11 MED ORDER — SODIUM CHLORIDE 0.9 % IV SOLN
4.7000 mg/kg | Freq: Once | INTRAVENOUS | Status: AC
  Administered 2013-03-11: 400 mg via INTRAVENOUS
  Filled 2013-03-11: qty 16

## 2013-03-11 NOTE — Patient Instructions (Addendum)
[facility] Discharge Instructions for Patients Receiving Chemotherapy  Today you received the following chemotherapy agents avastin  To help prevent nausea and vomiting after your treatment, we encourage you to take your nausea medication  and take it as often as prescribed   If you develop nausea and vomiting that is not controlled by your nausea medication, call the clinic. If it is after clinic hours your family physician or the after hours number for the clinic or go to the Emergency Department.   BELOW ARE SYMPTOMS THAT SHOULD BE REPORTED IMMEDIATELY:  *FEVER GREATER THAN 100.5 F  *CHILLS WITH OR WITHOUT FEVER  NAUSEA AND VOMITING THAT IS NOT CONTROLLED WITH YOUR NAUSEA MEDICATION  *UNUSUAL SHORTNESS OF BREATH  *UNUSUAL BRUISING OR BLEEDING  TENDERNESS IN MOUTH AND THROAT WITH OR WITHOUT PRESENCE OF ULCERS  *URINARY PROBLEMS  *BOWEL PROBLEMS  UNUSUAL RASH Items with * indicate a potential emergency and should be followed up as soon as possible.  One of the nurses will contact you 24 hours after your treatment. Please let the nurse know about any problems that you may have experienced. Feel free to call the clinic you have any questions or concerns. The clinic phone number is [phone].   I have been informed and understand all the instructions given to me. I know to contact the clinic, my physician, or go to the Emergency Department if any problems should occur. I do not have any questions at this time, but understand that I may call the clinic during office hours   should I have any questions or need assistance in obtaining follow up care.    __________________________________________  _____________  __________ Signature of Patient or Authorized Representative            Date                   Time    __________________________________________ Nurse's Signature    

## 2013-03-20 ENCOUNTER — Encounter: Payer: Self-pay | Admitting: *Deleted

## 2013-03-20 NOTE — Progress Notes (Signed)
RECEIVED A FAX FROM [employer] CONCERNING A CONFIRMATION OF PRESCRIPTION SHIPMENT FOR XELODA ON 03/19/13

## 2013-03-25 ENCOUNTER — Other Ambulatory Visit (HOSPITAL_BASED_OUTPATIENT_CLINIC_OR_DEPARTMENT_OTHER): Payer: Medicare Other | Admitting: Lab

## 2013-03-25 ENCOUNTER — Other Ambulatory Visit: Payer: Self-pay | Admitting: Oncology

## 2013-03-25 ENCOUNTER — Ambulatory Visit (HOSPITAL_BASED_OUTPATIENT_CLINIC_OR_DEPARTMENT_OTHER): Payer: Medicare Other

## 2013-03-25 VITALS — BP 130/74 | HR 76 | Temp 97.5°F | Resp 17

## 2013-03-25 DIAGNOSIS — C185 Malignant neoplasm of splenic flexure: Secondary | ICD-10-CM

## 2013-03-25 DIAGNOSIS — C189 Malignant neoplasm of colon, unspecified: Secondary | ICD-10-CM

## 2013-03-25 DIAGNOSIS — C3490 Malignant neoplasm of unspecified part of unspecified bronchus or lung: Secondary | ICD-10-CM

## 2013-03-25 DIAGNOSIS — Z5112 Encounter for antineoplastic immunotherapy: Secondary | ICD-10-CM

## 2013-03-25 DIAGNOSIS — C786 Secondary malignant neoplasm of retroperitoneum and peritoneum: Secondary | ICD-10-CM

## 2013-03-25 LAB — CBC WITH DIFFERENTIAL/PLATELET
BASO%: 0.6 % (ref 0.0–2.0)
Basophils Absolute: 0.1 10*3/uL (ref 0.0–0.1)
EOS%: 5 % (ref 0.0–7.0)
HGB: 16.4 g/dL (ref 13.0–17.1)
MCH: 31.9 pg (ref 27.2–33.4)
MCHC: 34 g/dL (ref 32.0–36.0)
MCV: 94 fL (ref 79.3–98.0)
MONO%: 6.2 % (ref 0.0–14.0)
RDW: 18.7 % — ABNORMAL HIGH (ref 11.0–14.6)
lymph#: 1.5 10*3/uL (ref 0.9–3.3)

## 2013-03-25 MED ORDER — SODIUM CHLORIDE 0.9 % IV SOLN
Freq: Once | INTRAVENOUS | Status: AC
  Administered 2013-03-25: 17:00:00 via INTRAVENOUS

## 2013-03-25 MED ORDER — SODIUM CHLORIDE 0.9 % IV SOLN
4.7000 mg/kg | Freq: Once | INTRAVENOUS | Status: AC
  Administered 2013-03-25: 400 mg via INTRAVENOUS
  Filled 2013-03-25: qty 16

## 2013-03-25 NOTE — Patient Instructions (Addendum)
[facility] Discharge Instructions for Patients Receiving Chemotherapy  Today you received the following chemotherapy agents :  Avastin.  To help prevent nausea and vomiting after your treatment, we encourage you to take your nausea medication as instructed by your physician.    If you develop nausea and vomiting that is not controlled by your nausea medication, call the clinic. If it is after clinic hours your family physician or the after hours number for the clinic or go to the Emergency Department.   BELOW ARE SYMPTOMS THAT SHOULD BE REPORTED IMMEDIATELY:  *FEVER GREATER THAN 100.5 F  *CHILLS WITH OR WITHOUT FEVER  NAUSEA AND VOMITING THAT IS NOT CONTROLLED WITH YOUR NAUSEA MEDICATION  *UNUSUAL SHORTNESS OF BREATH  *UNUSUAL BRUISING OR BLEEDING  TENDERNESS IN MOUTH AND THROAT WITH OR WITHOUT PRESENCE OF ULCERS  *URINARY PROBLEMS  *BOWEL PROBLEMS  UNUSUAL RASH Items with * indicate a potential emergency and should be followed up as soon as possible.  One of the nurses will contact you 24 hours after your treatment. Please let the nurse know about any problems that you may have experienced. Feel free to call the clinic you have any questions or concerns. The clinic phone number is [phone].   I have been informed and understand all the instructions given to me. I know to contact the clinic, my physician, or go to the Emergency Department if any problems should occur. I do not have any questions at this time, but understand that I may call the clinic during office hours   should I have any questions or need assistance in obtaining follow up care.    __________________________________________  _____________  __________ Signature of Patient or Authorized Representative            Date                   Time    __________________________________________ Nurse's Signature    

## 2013-04-07 ENCOUNTER — Ambulatory Visit: Payer: Medicare Other

## 2013-04-07 ENCOUNTER — Other Ambulatory Visit (HOSPITAL_BASED_OUTPATIENT_CLINIC_OR_DEPARTMENT_OTHER): Payer: Medicare Other | Admitting: Lab

## 2013-04-07 DIAGNOSIS — C3490 Malignant neoplasm of unspecified part of unspecified bronchus or lung: Secondary | ICD-10-CM

## 2013-04-07 DIAGNOSIS — C185 Malignant neoplasm of splenic flexure: Secondary | ICD-10-CM

## 2013-04-07 DIAGNOSIS — R809 Proteinuria, unspecified: Secondary | ICD-10-CM

## 2013-04-07 DIAGNOSIS — C189 Malignant neoplasm of colon, unspecified: Secondary | ICD-10-CM

## 2013-04-07 DIAGNOSIS — C786 Secondary malignant neoplasm of retroperitoneum and peritoneum: Secondary | ICD-10-CM

## 2013-04-07 LAB — CBC WITH DIFFERENTIAL/PLATELET
EOS%: 6.4 % (ref 0.0–7.0)
Eosinophils Absolute: 0.5 10*3/uL (ref 0.0–0.5)
MCH: 31.6 pg (ref 27.2–33.4)
MCV: 92.4 fL (ref 79.3–98.0)
MONO%: 5.8 % (ref 0.0–14.0)
NEUT#: 5.7 10*3/uL (ref 1.5–6.5)
RBC: 5.38 10*6/uL (ref 4.20–5.82)
RDW: 18.6 % — ABNORMAL HIGH (ref 11.0–14.6)
lymph#: 1.7 10*3/uL (ref 0.9–3.3)
nRBC: 0 % (ref 0–0)

## 2013-04-07 LAB — COMPREHENSIVE METABOLIC PANEL (CC13)
ALT: 12 U/L (ref 0–55)
AST: 16 U/L (ref 5–34)
Calcium: 10.3 mg/dL (ref 8.4–10.4)
Chloride: 106 mEq/L (ref 98–107)
Creatinine: 1.4 mg/dL — ABNORMAL HIGH (ref 0.7–1.3)

## 2013-04-07 LAB — CEA: CEA: 13.5 ng/mL — ABNORMAL HIGH (ref 0.0–5.0)

## 2013-04-07 LAB — UA PROTEIN, DIPSTICK - CHCC: Protein, ur: 100 mg/dL

## 2013-04-07 NOTE — Progress Notes (Signed)
Per Bobbe Medico, NP Avastin held today due to Urine Protein 100.  Order received for 24hr protein study.  Pt to return on 4/21 with appointment with Dr. Cyndie Chime and possible rescheduling of Avastin.

## 2013-04-08 ENCOUNTER — Other Ambulatory Visit: Payer: Medicare Other

## 2013-04-08 ENCOUNTER — Ambulatory Visit (HOSPITAL_COMMUNITY)
Admission: RE | Admit: 2013-04-08 | Discharge: 2013-04-08 | Disposition: A | Discharge status: Home / Self Care | Payer: Medicare Other | Source: Ambulatory Visit | Attending: Oncology | Admitting: Oncology

## 2013-04-08 DIAGNOSIS — K5730 Diverticulosis of large intestine without perforation or abscess without bleeding: Secondary | ICD-10-CM | POA: Insufficient documentation

## 2013-04-08 DIAGNOSIS — R911 Solitary pulmonary nodule: Secondary | ICD-10-CM | POA: Insufficient documentation

## 2013-04-08 DIAGNOSIS — C3490 Malignant neoplasm of unspecified part of unspecified bronchus or lung: Secondary | ICD-10-CM

## 2013-04-08 DIAGNOSIS — C189 Malignant neoplasm of colon, unspecified: Secondary | ICD-10-CM | POA: Insufficient documentation

## 2013-04-08 MED ORDER — IOHEXOL 300 MG/ML  SOLN
100.0000 mL | Freq: Once | INTRAMUSCULAR | Status: AC | PRN
  Administered 2013-04-08: 100 mL via INTRAVENOUS

## 2013-04-10 ENCOUNTER — Ambulatory Visit: Payer: Medicare Other | Admitting: Lab

## 2013-04-10 DIAGNOSIS — C3490 Malignant neoplasm of unspecified part of unspecified bronchus or lung: Secondary | ICD-10-CM

## 2013-04-10 DIAGNOSIS — R809 Proteinuria, unspecified: Secondary | ICD-10-CM

## 2013-04-10 LAB — PROTEIN, URINE, 24 HOUR: Protein, 24H Urine: 550 mg/d — ABNORMAL HIGH (ref 50–100)

## 2013-04-13 ENCOUNTER — Ambulatory Visit (HOSPITAL_BASED_OUTPATIENT_CLINIC_OR_DEPARTMENT_OTHER): Payer: Medicare Other | Admitting: Oncology

## 2013-04-13 VITALS — BP 146/84 | HR 84 | Temp 97.5°F | Resp 20 | Ht 67.0 in | Wt 180.1 lb

## 2013-04-13 DIAGNOSIS — C786 Secondary malignant neoplasm of retroperitoneum and peritoneum: Secondary | ICD-10-CM

## 2013-04-13 DIAGNOSIS — C189 Malignant neoplasm of colon, unspecified: Secondary | ICD-10-CM

## 2013-04-13 DIAGNOSIS — C3430 Malignant neoplasm of lower lobe, unspecified bronchus or lung: Secondary | ICD-10-CM

## 2013-04-13 DIAGNOSIS — C185 Malignant neoplasm of splenic flexure: Secondary | ICD-10-CM

## 2013-04-14 ENCOUNTER — Telehealth: Payer: Self-pay | Admitting: *Deleted

## 2013-04-14 ENCOUNTER — Other Ambulatory Visit: Payer: Self-pay | Admitting: *Deleted

## 2013-04-14 ENCOUNTER — Telehealth: Payer: Self-pay | Admitting: Oncology

## 2013-04-14 ENCOUNTER — Other Ambulatory Visit: Payer: Self-pay | Admitting: Oncology

## 2013-04-14 DIAGNOSIS — C189 Malignant neoplasm of colon, unspecified: Secondary | ICD-10-CM

## 2013-04-14 NOTE — Telephone Encounter (Signed)
Per staff message and POF I have scheduled appts.  JMW  

## 2013-04-14 NOTE — Telephone Encounter (Signed)
Per staff phone call and POF I have schedueld appts.  JMW  

## 2013-04-14 NOTE — Progress Notes (Signed)
Hematology and Oncology Follow Up Visit  [name] [id number] 18-Feb-1941 72 y.o. 04/14/2013 8:11 PM   Principle Diagnosis: Encounter Diagnosis  Name Primary?  . 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 He developed a rise in CEA tumor marker and 2 intra-abdominal soft tissue densities on followup CT scan done in November 2012 while on adjuvant Xeloda chemotherapy. Oxaliplatin and Avastin were added to his regimen. Oxaliplatinum was started 11/26/2011. He had a steady fall in his CEA tumor marker from peak value of 12.6 on 11/02/2011 to 2.3  by July 10. Unfortunately, he developed cumulative neurotoxicity from the Oxaliplatinum. We were following his symptoms closely but he had a sudden worsening of neuropathy, hands greater than feet, and was having trouble buttoning buttons and uncomfortable dysesthesias. I had to stop the Oxaliplatinum. He continues on Xeloda one week on 1 week off and every two-week Avastin.  .  Of interest is the fact that since we added Avastin to his regimen, the right lower lobe lung lesion has decreased in size and is no longer obvious on CT scans. This represents a second primary lung cancer unrelated to his colon cancer.  Disease is now starting to break through the [facility] with a slow but steady rise in his CEA to current value of 13.5 units done April 15 compared with 10.5 in March, 11.3 in February, 7.0 in January. There is no more convincing evidence on CT scan that his peritoneal implants are enlarging. Still no evidence for additional disease in his chest or liver.  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   Medications: reviewed  Allergies: No Known Allergies  Review of  Systems: Constitutional:   Weight is down 4 pounds compared with his February visit here Respiratory: No cough or dyspnea Cardiovascular:  No chest pain or palpitations Gastrointestinal: No abdominal pain, change in bowel habits, hematochezia, or melena Genito-Urinary: No urinary tract symptoms Musculoskeletal: No muscle or bone pain Neurologic: Chronic visual problems status post bilateral corneal transplants Skin: No rash or ecchymosis Remaining ROS negative.  Physical Exam: Blood pressure 146/84, pulse 84, temperature 97.5 F (36.4 C), temperature source Oral, resp. rate 20, height 5\' 7"  (1.702 m), weight 180 lb 1.6 oz (81.693 kg). Wt Readings from Last 3 Encounters:  04/13/13 180 lb 1.6 oz (81.693 kg)  02/20/13 183 lb 12.8 oz (83.371 kg)  01/09/13 180 lb 8 oz (81.874 kg)     General appearance: An adequately nourished Caucasian man HENNT: Pharynx no erythema exudate or ulcer Lymph nodes: No adenopathy Breasts: Lungs: Clear to auscultation resonant to percussion Heart: Regular rhythm, 2/6 aortic systolic murmur Abdomen: Soft, nontender, no mass, no organomegaly Extremities: No edema, no calf tenderness Vascular: No cyanosis Neurologic: No focal deficit. Visual testing not done Skin: No rash or ecchymosis  Lab Results: Lab Results  Component Value Date   WBC 8.5 04/07/2013   HGB 17.0 04/07/2013   HCT 49.7 04/07/2013   MCV 92.4 04/07/2013   PLT 186 04/07/2013     Chemistry      Component Value Date/Time   NA 138 04/07/2013 1528   NA 138 12/31/2012 1632   NA 139 10/15/2011 1601   K 4.7 04/07/2013 1528   K 4.1 12/31/2012 1632  K 4.3 10/15/2011 1601   CL 106 04/07/2013 1528   CL 105 12/31/2012 1632   CL 101 10/15/2011 1601   CO2 24 04/07/2013 1528   CO2 24 12/31/2012 1632   CO2 30 10/15/2011 1601   BUN 15.3 04/07/2013 1528   BUN 12 12/31/2012 1632   BUN 17 10/15/2011 1601   CREATININE 1.4* 04/07/2013 1528   CREATININE 1.08 12/31/2012 1632   CREATININE 1.1 10/15/2011 1601       Component Value Date/Time   CALCIUM 10.3 04/07/2013 1528   CALCIUM 10.0 12/31/2012 1632   CALCIUM 9.3 10/15/2011 1601   ALKPHOS 69 04/07/2013 1528   ALKPHOS 50 12/31/2012 1632   ALKPHOS 59 10/15/2011 1601   AST 16 04/07/2013 1528   AST 19 12/31/2012 1632   AST 35 10/15/2011 1601   ALT 12 04/07/2013 1528   ALT 14 12/31/2012 1632   BILITOT 1.09 04/07/2013 1528   BILITOT 0.6 12/31/2012 1632   BILITOT 1.30 10/15/2011 1601       Radiological Studies: Ct Chest W Contrast  04/09/2013  *RADIOLOGY REPORT*  Clinical Data:  Followup metastatic colon cancer  CT CHEST, ABDOMEN AND PELVIS WITH CONTRAST  Technique:  Multidetector CT imaging of the chest, abdomen and pelvis was performed following the standard protocol during bolus administration of intravenous contrast.  Contrast: OMNIPAQUE IOHEXOL 300 MG/ML  SOLN  Comparison:  01/07/2013  CT CHEST  Findings:  Lungs/pleura: There is no pleural effusion identified. There is no airspace consolidation noted.  Scarring, architectural distortion and postoperative change noted within the left upper lobe.  Seed implants are noted along the periphery of the left upper lobe.  There is a new nodule within the medial right lung base measuring 1.8 cm, image 44/series 5.  Heart/Mediastinum: Trachea is patent and midlung.  Normal heart size.  There is no mediastinal or hilar adenopathy noted.  Bones/Musculoskeletal:  No enlarged axillary or supraclavicular lymph nodes. Mild spondylosis identified within the thoracic spine. No worrisome lytic or sclerotic bone lesions identified.  IMPRESSION:  1.  Stable postoperative change within the left upper lobe. 2.  New nodule within the medial left lower lobe which is indeterminate.  This may be better assessed with PET CT.  CT ABDOMEN AND PELVIS  Findings: There is no focal liver abnormality identified.  There is mild low attenuation throughout the liver parenchyma.  The gallbladder appears normal.  No biliary dilatation.  The pancreas is  unremarkable.  Normal appearance of the spleen.  The adrenal glands are both normal.  The right kidney is unremarkable.  The left kidney is normal.  Urinary bladder is unremarkable.  The prostate gland and seminal vesicles are unremarkable.  Calcified atherosclerotic disease involves the abdominal aorta. There is no aneurysm identified.  No enlarged upper abdominal or pelvic lymph nodes.  No inguinal adenopathy noted.  No free fluid or fluid collections identified within the abdomen or pelvis.  The stomach appears normal.  The small bowel loops have a normal course and caliber.  The colon has a normal course and caliber.  There is no evidence for bowel obstruction.  Multiple distal colonic diverticula identified without acute inflammation.  Multi focal peritoneal nodules are identified.  Index nodule in the left upper quadrant of the abdomen measures 1.3 cm, image 59/series 2.  Previously 1.1 cm.  Peritoneal implant within the mid abdomen measures 1 cm, image 63/series 2.  Previously 9 mm.  More caudally there is an implant which measures 1.5 cm, image 83/series  2. Previously this measured 1.2 cm.  Along the surface of the gastric antrum there is 2.1 cm implant, image 53/series 2.  Previously 1.2 cm.  There is mild multilevel degenerative disc disease noted throughout the lumbar spine.  There is a first-degree anterolisthesis of L5 on S1.  Right-sided pars defect is noted at this level.  IMPRESSION:  1.  There has been increase in size of previously noted peritoneal implants.   Original Report Authenticated By: Signa Kell, M.D.    Ct Abdomen Pelvis W Contrast  04/09/2013  *RADIOLOGY REPORT*  Clinical Data:  Followup metastatic colon cancer  CT CHEST, ABDOMEN AND PELVIS WITH CONTRAST  Technique:  Multidetector CT imaging of the chest, abdomen and pelvis was performed following the standard protocol during bolus administration of intravenous contrast.  Contrast: OMNIPAQUE IOHEXOL 300 MG/ML  SOLN   Comparison:  01/07/2013  CT CHEST  Findings:  Lungs/pleura: There is no pleural effusion identified. There is no airspace consolidation noted.  Scarring, architectural distortion and postoperative change noted within the left upper lobe.  Seed implants are noted along the periphery of the left upper lobe.  There is a new nodule within the medial right lung base measuring 1.8 cm, image 44/series 5.  Heart/Mediastinum: Trachea is patent and midlung.  Normal heart size.  There is no mediastinal or hilar adenopathy noted.  Bones/Musculoskeletal:  No enlarged axillary or supraclavicular lymph nodes. Mild spondylosis identified within the thoracic spine. No worrisome lytic or sclerotic bone lesions identified.  IMPRESSION:  1.  Stable postoperative change within the left upper lobe. 2.  New nodule within the medial left lower lobe which is indeterminate.  This may be better assessed with PET CT.  CT ABDOMEN AND PELVIS  Findings: There is no focal liver abnormality identified.  There is mild low attenuation throughout the liver parenchyma.  The gallbladder appears normal.  No biliary dilatation.  The pancreas is unremarkable.  Normal appearance of the spleen.  The adrenal glands are both normal.  The right kidney is unremarkable.  The left kidney is normal.  Urinary bladder is unremarkable.  The prostate gland and seminal vesicles are unremarkable.  Calcified atherosclerotic disease involves the abdominal aorta. There is no aneurysm identified.  No enlarged upper abdominal or pelvic lymph nodes.  No inguinal adenopathy noted.  No free fluid or fluid collections identified within the abdomen or pelvis.  The stomach appears normal.  The small bowel loops have a normal course and caliber.  The colon has a normal course and caliber.  There is no evidence for bowel obstruction.  Multiple distal colonic diverticula identified without acute inflammation.  Multi focal peritoneal nodules are identified.  Index nodule in the left  upper quadrant of the abdomen measures 1.3 cm, image 59/series 2.  Previously 1.1 cm.  Peritoneal implant within the mid abdomen measures 1 cm, image 63/series 2.  Previously 9 mm.  More caudally there is an implant which measures 1.5 cm, image 83/series 2. Previously this measured 1.2 cm.  Along the surface of the gastric antrum there is 2.1 cm implant, image 53/series 2.  Previously 1.2 cm.  There is mild multilevel degenerative disc disease noted throughout the lumbar spine.  There is a first-degree anterolisthesis of L5 on S1.  Right-sided pars defect is noted at this level.  IMPRESSION:  1.  There has been increase in size of previously noted peritoneal implants.   Original Report Authenticated By: Signa Kell, M.D.     Impression and  Plan: #1. Slowly progressive colon cancer with enlarging peritoneal implants.  He has not been exposed to Irinotecan. I reviewed the FOLFIRI regimen with him. I will continue the Avastin. We will make arrangements to have a Port-A-Cath infusion device placed. Plan to begin the new regimen the first week in May.  I called pathology in his tumor was never tested for K-ras mutation. Have requested that this be done since it may open up another therapeutic option for him in the future.  #2. Status post resection stage I non-small cell lung cancer left upper lobe with postop brachytherapy June 2007  #3. Second primary lung cancer right lung Radiographic complete regression on Avastin-based chemotherapy.  #4. Oxaliplatinum related neuropathy   #5. Type 2 diabetes   #6. History of nephrolithiasis   #7. Status post bilateral corneal transplants     CC:.    Levert Feinstein, MD 4/22/20148:11 PM

## 2013-04-15 ENCOUNTER — Other Ambulatory Visit: Payer: Self-pay | Admitting: Oncology

## 2013-04-20 ENCOUNTER — Encounter (HOSPITAL_COMMUNITY): Payer: Self-pay | Admitting: Pharmacy Technician

## 2013-04-22 ENCOUNTER — Other Ambulatory Visit: Payer: Self-pay | Admitting: Radiology

## 2013-04-27 ENCOUNTER — Other Ambulatory Visit: Payer: Self-pay | Admitting: Oncology

## 2013-04-27 ENCOUNTER — Ambulatory Visit (HOSPITAL_COMMUNITY)
Admission: RE | Admit: 2013-04-27 | Discharge: 2013-04-27 | Disposition: A | Discharge status: Home / Self Care | Payer: Medicare Other | Source: Ambulatory Visit | Attending: Oncology | Admitting: Oncology

## 2013-04-27 ENCOUNTER — Encounter (HOSPITAL_COMMUNITY): Payer: Self-pay

## 2013-04-27 DIAGNOSIS — C189 Malignant neoplasm of colon, unspecified: Secondary | ICD-10-CM

## 2013-04-27 DIAGNOSIS — Z79899 Other long term (current) drug therapy: Secondary | ICD-10-CM | POA: Insufficient documentation

## 2013-04-27 DIAGNOSIS — E785 Hyperlipidemia, unspecified: Secondary | ICD-10-CM | POA: Insufficient documentation

## 2013-04-27 DIAGNOSIS — J4489 Other specified chronic obstructive pulmonary disease: Secondary | ICD-10-CM | POA: Insufficient documentation

## 2013-04-27 DIAGNOSIS — C785 Secondary malignant neoplasm of large intestine and rectum: Secondary | ICD-10-CM | POA: Insufficient documentation

## 2013-04-27 DIAGNOSIS — J449 Chronic obstructive pulmonary disease, unspecified: Secondary | ICD-10-CM | POA: Insufficient documentation

## 2013-04-27 DIAGNOSIS — E119 Type 2 diabetes mellitus without complications: Secondary | ICD-10-CM | POA: Insufficient documentation

## 2013-04-27 DIAGNOSIS — Z85118 Personal history of other malignant neoplasm of bronchus and lung: Secondary | ICD-10-CM | POA: Insufficient documentation

## 2013-04-27 LAB — BASIC METABOLIC PANEL
BUN: 13 mg/dL (ref 6–23)
Calcium: 9.9 mg/dL (ref 8.4–10.5)
Chloride: 104 mEq/L (ref 96–112)
Creatinine, Ser: 1.15 mg/dL (ref 0.50–1.35)
GFR calc Af Amer: 72 mL/min — ABNORMAL LOW (ref >90)

## 2013-04-27 LAB — CBC
HCT: 48.5 % (ref 39.0–52.0)
MCHC: 35.3 g/dL (ref 30.0–36.0)
MCV: 91.3 fL (ref 78.0–100.0)
Platelets: 134 10*3/uL — ABNORMAL LOW (ref 150–400)
RDW: 18.1 % — ABNORMAL HIGH (ref 11.5–15.5)

## 2013-04-27 LAB — PROTIME-INR: INR: 0.95 (ref 0.00–1.49)

## 2013-04-27 MED ORDER — LIDOCAINE HCL 1 % IJ SOLN
INTRAMUSCULAR | Status: AC
  Filled 2013-04-27: qty 20

## 2013-04-27 MED ORDER — FENTANYL CITRATE 0.05 MG/ML IJ SOLN
INTRAMUSCULAR | Status: AC | PRN
  Administered 2013-04-27: 50 ug via INTRAVENOUS
  Administered 2013-04-27: 25 ug via INTRAVENOUS

## 2013-04-27 MED ORDER — SODIUM CHLORIDE 0.9 % IV SOLN
Freq: Once | INTRAVENOUS | Status: AC
  Administered 2013-04-27: 20 mL/h via INTRAVENOUS

## 2013-04-27 MED ORDER — CEFAZOLIN SODIUM-DEXTROSE 2-3 GM-% IV SOLR
2.0000 g | Freq: Once | INTRAVENOUS | Status: AC
  Administered 2013-04-27: 2 g via INTRAVENOUS

## 2013-04-27 MED ORDER — MIDAZOLAM HCL 2 MG/2ML IJ SOLN
INTRAMUSCULAR | Status: AC | PRN
  Administered 2013-04-27: 1 mg via INTRAVENOUS
  Administered 2013-04-27: 0.5 mg via INTRAVENOUS

## 2013-04-27 MED ORDER — CEFAZOLIN SODIUM-DEXTROSE 2-3 GM-% IV SOLR
INTRAVENOUS | Status: AC
  Filled 2013-04-27: qty 100

## 2013-04-27 MED ORDER — FENTANYL CITRATE 0.05 MG/ML IJ SOLN
INTRAMUSCULAR | Status: AC
  Filled 2013-04-27: qty 6

## 2013-04-27 MED ORDER — HEPARIN SOD (PORK) LOCK FLUSH 100 UNIT/ML IV SOLN
500.0000 [IU] | Freq: Once | INTRAVENOUS | Status: AC
  Administered 2013-04-27: 500 [IU] via INTRAVENOUS

## 2013-04-27 MED ORDER — MIDAZOLAM HCL 2 MG/2ML IJ SOLN
INTRAMUSCULAR | Status: AC
  Filled 2013-04-27: qty 6

## 2013-04-27 NOTE — H&P (Signed)
[name] is an 72 y.o. male.   Chief Complaint: "I'm getting a port a cath" HPI: Patient with history of metastatic colon carcinoma and primary lung carcinoma presents today for port a cath placement for chemotherapy.  Past Medical History  Diagnosis Date  . COPD (chronic obstructive pulmonary disease)   . Nephrolithiasis   . Diverticulosis   . Hx of adenomatous colonic polyps 04/30/07  . Glaucoma(365)   . Hyperlipidemia   . ED (erectile dysfunction)   . Lung nodule   . Hepatic steatosis   . Thoracic spondylosis   . Non-small cell lung cancer   . SOB (shortness of breath)   . Bruises easily   . Hearing loss     slight  . Contact lens/glasses fitting   . Rectal bleeding   . Hemorrhoids   . Glaucoma(365)   . 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 Colon cancer   . Diabetes mellitus     type II  . Anemia     Past Surgical History  Procedure Laterality Date  . 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 Lung removal, partial  05/2006    Lt upper lobe wedge resection  . Tonsillectomy    . Kidney stone removal  07/2006  . Colonoscopy    . Polypectomy    . Colon surgery  06/2011  . 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 Trigger finger release  08/29/2012    Procedure: RELEASE TRIGGER FINGER/A-1 PULLEY;  Surgeon: Wyn Forster., MD;  Location: [facility];  Service: Orthopedics;  Laterality: Left;  release a1 pulley left long    Family History  Problem Relation Age of Onset  . Asthma      aunt  . Asthma Cousin   . Emphysema Father   . Leukemia Father   . Lung cancer Father   . Ulcers Brother     of the stomach  . Esophageal cancer Mother   . Cancer Paternal Aunt     breast  . Cancer Daughter    Social History:  reports that he quit smoking about 32 years ago. His smoking use included Cigarettes. He has a 50 pack-year smoking history. He has never used smokeless tobacco. He reports that he drinks about 0.6  ounces of alcohol per week. He reports that he does not use illicit drugs.  Allergies: No Known Allergies  Current outpatient prescriptions:aspirin 81 MG tablet, Take 81 mg by mouth daily. , Disp: , Rfl: ;  Cholecalciferol (VITAMIN D3) 2000 UNITS TABS, Take 1 tablet by mouth daily. , Disp: , Rfl: ;  cyanocobalamin 500 MCG tablet, Take 1,000 mcg by mouth daily. , Disp: , Rfl: ;  loratadine (CLARITIN) 10 MG tablet, Take 10 mg by mouth 2 (two) times daily.  , Disp: , Rfl:  loteprednol (LOTEMAX) 0.5 % ophthalmic suspension, Place 1 drop into the left eye daily. , Disp: , Rfl: ;  metFORMIN (GLUMETZA) 1000 MG (MOD) 24 hr tablet, Take 1,000 mg by mouth daily after supper. , Disp: , Rfl: ;  Omega-3 Fatty Acids (FISH OIL) 1000 MG CAPS, Take 6 capsules by mouth daily. , Disp: , Rfl: ;  timolol (BETIMOL) 0.25 % ophthalmic solution, Place 1 drop into the left eye 2 (two) times daily. , Disp: , Rfl:  Bevacizumab (AVASTIN IV), Inject into the vein. Every 2 weeks, Disp: , Rfl: ;  ezetimibe-simvastatin (VYTORIN)  10-40 MG per tablet, 0.5 tablets 3 (three) times a week. 1/2 tab 4-5 times per week, Disp: , Rfl: ;  testosterone enanthate (DELATESTRYL) 200 MG/ML injection, Inject 500 mg into the muscle every 21 ( twenty-one) days. Every 3 weeks currently, Disp: , Rfl:  Current facility-administered medications:ceFAZolin (ANCEF) IVPB 2 g/50 mL premix, 2 g, Intravenous, Once, Robet Leu, PA-C;  fentaNYL (SUBLIMAZE) 0.05 MG/ML injection, , , , ;  lidocaine (XYLOCAINE) 1 % (with pres) injection, , , , ;  midazolam (VERSED) 2 MG/2ML injection, , , ,  Facility-Administered Medications Ordered in Other Encounters: ceFAZolin (ANCEF) 2-3 GM-% IVPB SOLR, , , ,    Results for orders placed during the hospital encounter of 04/27/13 (from the past 48 hour(s))  APTT     Status: None   Collection Time    04/27/13  1:00 PM      Result Value Range   aPTT 30  24 - 37 seconds  BASIC METABOLIC PANEL     Status: Abnormal   Collection  Time    04/27/13  1:00 PM      Result Value Range   Sodium 138  135 - 145 mEq/L   Potassium 4.2  3.5 - 5.1 mEq/L   Chloride 104  96 - 112 mEq/L   CO2 24  19 - 32 mEq/L   Glucose, Bld 120 (*) 70 - 99 mg/dL   BUN 13  6 - 23 mg/dL   Creatinine, Ser 0.45  0.50 - 1.35 mg/dL   Calcium 9.9  8.4 - 40.9 mg/dL   GFR calc non Af Amer 62 (*) >90 mL/min   GFR calc Af Amer 72 (*) >90 mL/min   Comment:            The eGFR has been calculated     using the CKD EPI equation.     This calculation has not been     validated in all clinical     situations.     eGFR's persistently     <90 mL/min signify     possible Chronic Kidney Disease.  CBC     Status: Abnormal   Collection Time    04/27/13  1:00 PM      Result Value Range   WBC 10.2  4.0 - 10.5 K/uL   RBC 5.31  4.22 - 5.81 MIL/uL   Hemoglobin 17.1 (*) 13.0 - 17.0 g/dL   HCT 81.1  91.4 - 78.2 %   MCV 91.3  78.0 - 100.0 fL   MCH 32.2  26.0 - 34.0 pg   MCHC 35.3  30.0 - 36.0 g/dL   RDW 95.6 (*) 21.3 - 08.6 %   Platelets 134 (*) 150 - 400 K/uL  PROTIME-INR     Status: None   Collection Time    04/27/13  1:00 PM      Result Value Range   Prothrombin Time 12.6  11.6 - 15.2 seconds   INR 0.95  0.00 - 1.49   No results found.  Review of Systems  Constitutional: Negative for fever and chills.  Respiratory: Negative for cough and shortness of breath.   Cardiovascular: Negative for chest pain.  Gastrointestinal: Negative for vomiting and abdominal pain.  Musculoskeletal: Negative for back pain.  Neurological: Negative for headaches.  Endo/Heme/Allergies: Does not bruise/bleed easily.    Blood pressure 135/72, pulse 79, temperature 96.8 F (36 C), temperature source Oral, resp. rate 17, SpO2 96.00%. Physical Exam  Constitutional: He is oriented to  person, place, and time. He appears well-developed and well-nourished.  Cardiovascular: Normal rate and regular rhythm.   Murmur heard. Respiratory: Effort normal and breath sounds normal.   GI: Soft. Bowel sounds are normal. There is no tenderness.  Musculoskeletal: Normal range of motion. He exhibits no edema.  Neurological: He is alert and oriented to person, place, and time.     Assessment/Plan: Pt with metastatic colon carcinoma and prior NSC lung carcinoma. Plan is for port a cath placement today for chemotherapy. Details/risks of procedure d/w pt/wife with their understanding and consent.  Raven Furnas,D KEVIN 04/27/2013, 3:18 PM

## 2013-04-27 NOTE — Procedures (Signed)
R IJ Port placed No complication No blood loss. See complete dictation in [facility] PACS.  

## 2013-04-29 ENCOUNTER — Other Ambulatory Visit: Payer: Self-pay | Admitting: *Deleted

## 2013-04-29 ENCOUNTER — Ambulatory Visit (HOSPITAL_BASED_OUTPATIENT_CLINIC_OR_DEPARTMENT_OTHER): Payer: Medicare Other

## 2013-04-29 ENCOUNTER — Other Ambulatory Visit (HOSPITAL_BASED_OUTPATIENT_CLINIC_OR_DEPARTMENT_OTHER): Payer: Medicare Other | Admitting: Lab

## 2013-04-29 VITALS — BP 139/86 | HR 87 | Temp 97.2°F | Resp 18

## 2013-04-29 DIAGNOSIS — C189 Malignant neoplasm of colon, unspecified: Secondary | ICD-10-CM

## 2013-04-29 DIAGNOSIS — Z5111 Encounter for antineoplastic chemotherapy: Secondary | ICD-10-CM

## 2013-04-29 DIAGNOSIS — C185 Malignant neoplasm of splenic flexure: Secondary | ICD-10-CM

## 2013-04-29 DIAGNOSIS — C786 Secondary malignant neoplasm of retroperitoneum and peritoneum: Secondary | ICD-10-CM

## 2013-04-29 LAB — COMPREHENSIVE METABOLIC PANEL (CC13)
CO2: 25 mEq/L (ref 22–29)
Calcium: 9.5 mg/dL (ref 8.4–10.4)
Creatinine: 1.2 mg/dL (ref 0.7–1.3)
Glucose: 127 mg/dl — ABNORMAL HIGH (ref 70–99)
Total Bilirubin: 1.1 mg/dL (ref 0.20–1.20)
Total Protein: 6.3 g/dL — ABNORMAL LOW (ref 6.4–8.3)

## 2013-04-29 LAB — CBC WITH DIFFERENTIAL/PLATELET
BASO%: 0.7 % (ref 0.0–2.0)
EOS%: 6.1 % (ref 0.0–7.0)
HCT: 48.9 % (ref 38.4–49.9)
LYMPH%: 15.5 % (ref 14.0–49.0)
MCH: 31.1 pg (ref 27.2–33.4)
MCHC: 33.3 g/dL (ref 32.0–36.0)
NEUT%: 68.6 % (ref 39.0–75.0)
Platelets: 135 10*3/uL — ABNORMAL LOW (ref 140–400)

## 2013-04-29 LAB — POCT URINE QUALITATIVE DIPSTICK PROTEIN: Protein, UA: 30

## 2013-04-29 LAB — LACTATE DEHYDROGENASE (CC13): LDH: 223 U/L (ref 125–245)

## 2013-04-29 MED ORDER — DEXAMETHASONE SODIUM PHOSPHATE 20 MG/5ML IJ SOLN
20.0000 mg | Freq: Once | INTRAMUSCULAR | Status: AC
  Administered 2013-04-29: 20 mg via INTRAVENOUS

## 2013-04-29 MED ORDER — ONDANSETRON HCL 8 MG PO TABS: 8.0000 mg | ORAL_TABLET | Freq: Two times a day (BID) | ORAL | Status: DC

## 2013-04-29 MED ORDER — FLUOROURACIL CHEMO INJECTION 2.5 GM/50ML
400.0000 mg/m2 | Freq: Once | INTRAVENOUS | Status: AC
  Administered 2013-04-29: 800 mg via INTRAVENOUS
  Filled 2013-04-29: qty 16

## 2013-04-29 MED ORDER — IRINOTECAN HCL CHEMO INJECTION 100 MG/5ML
183.0000 mg/m2 | Freq: Once | INTRAVENOUS | Status: AC
  Administered 2013-04-29: 360 mg via INTRAVENOUS
  Filled 2013-04-29: qty 18

## 2013-04-29 MED ORDER — FLUOROURACIL CHEMO INJECTION 5 GM/100ML
2400.0000 mg/m2 | INTRAVENOUS | Status: DC
  Administered 2013-04-29: 4750 mg via INTRAVENOUS
  Filled 2013-04-29: qty 95

## 2013-04-29 MED ORDER — SODIUM CHLORIDE 0.9 % IV SOLN: 5.0000 mg/kg | Freq: Once | INTRAVENOUS | Status: DC

## 2013-04-29 MED ORDER — ONDANSETRON HCL 8 MG PO TABS: 8.0000 mg | ORAL_TABLET | Freq: Two times a day (BID) | ORAL | Status: DC | PRN

## 2013-04-29 MED ORDER — PROCHLORPERAZINE MALEATE 10 MG PO TABS: 10.0000 mg | ORAL_TABLET | Freq: Four times a day (QID) | ORAL | Status: DC | PRN

## 2013-04-29 MED ORDER — LEUCOVORIN CALCIUM INJECTION 350 MG
406.0000 mg/m2 | Freq: Once | INTRAMUSCULAR | Status: AC
  Administered 2013-04-29: 800 mg via INTRAVENOUS
  Filled 2013-04-29: qty 40

## 2013-04-29 MED ORDER — ONDANSETRON 16 MG/50ML IVPB (CHCC)
16.0000 mg | Freq: Once | INTRAVENOUS | Status: AC
  Administered 2013-04-29: 16 mg via INTRAVENOUS

## 2013-04-29 MED ORDER — LORAZEPAM 0.5 MG PO TABS: 0.5000 mg | ORAL_TABLET | Freq: Four times a day (QID) | ORAL | Status: DC | PRN

## 2013-04-29 MED ORDER — DEXAMETHASONE 4 MG PO TABS: 8.0000 mg | ORAL_TABLET | Freq: Two times a day (BID) | ORAL | Status: DC

## 2013-04-29 MED ORDER — SODIUM CHLORIDE 0.9 % IV SOLN
Freq: Once | INTRAVENOUS | Status: AC
  Administered 2013-04-29: 10:00:00 via INTRAVENOUS

## 2013-04-29 NOTE — Patient Instructions (Addendum)
[facility] Discharge Instructions for Patients Receiving Chemotherapy  Today you received the following chemotherapy agents: Irinotecan, Leucovorin and Fluorouracil.  To help prevent nausea and vomiting after your treatment, we encourage you to take your nausea medication, Zofran. Begin taking it the morning of 04/30/13 and take it twice daily for 3 days. Take Compazine every six hours as needed for nausea. This may make you drowsy.   If you develop nausea and vomiting that is not controlled by your nausea medication, call the clinic. If it is after clinic hours your family physician or the after hours number for the clinic or go to the Emergency Department.  To prevent diarrhea: Take 2 Imodium (over the counter) with the first loose stool. Follow the instruction card given to you by the nurse.   BELOW ARE SYMPTOMS THAT SHOULD BE REPORTED IMMEDIATELY:  *FEVER GREATER THAN 100.5 F  *CHILLS WITH OR WITHOUT FEVER  NAUSEA AND VOMITING THAT IS NOT CONTROLLED WITH YOUR NAUSEA MEDICATION  *UNUSUAL SHORTNESS OF BREATH  *UNUSUAL BRUISING OR BLEEDING  TENDERNESS IN MOUTH AND THROAT WITH OR WITHOUT PRESENCE OF ULCERS  *URINARY PROBLEMS  *BOWEL PROBLEMS  UNUSUAL RASH Items with * indicate a potential emergency and should be followed up as soon as possible.  One of the nurses will contact you 24 hours after your treatment. Please let the nurse know about any problems that you may have experienced. Feel free to call the clinic you have any questions or concerns. The clinic phone number is [phone].   I have been informed and understand all the instructions given to me. I know to contact the clinic, my physician, or go to the Emergency Department if any problems should occur. I do not have any questions at this time, but understand that I may call the clinic during office hours   should I have any questions or need assistance in obtaining follow up care.    Irinotecan  injection What is this medicine? IRINOTECAN (ir in oh TEE kan ) is a chemotherapy drug. It is used to treat colon and rectal cancer. This medicine may be used for other purposes; ask your health care provider or pharmacist if you have questions. What should I tell my health care provider before I take this medicine? They need to know if you have any of these conditions: -blood disorders -dehydration -diarrhea -infection (especially a virus infection such as chickenpox, cold sores, or herpes) -liver disease -low blood counts, like low white cell, platelet, or red cell counts -recent or ongoing radiation therapy -an unusual or allergic reaction to irinotecan, sorbitol, other chemotherapy, other medicines, foods, dyes, or preservatives -pregnant or trying to get pregnant -breast-feeding How should I use this medicine? This drug is given as an infusion into a vein. It is administered in a hospital or clinic by a specially trained health care professional. Talk to your pediatrician regarding the use of this medicine in children. Special care may be needed. Overdosage: If you think you have taken too much of this medicine contact a poison control center or emergency room at once. NOTE: This medicine is only for you. Do not share this medicine with others. What if I miss a dose? It is important not to miss your dose. Call your doctor or health care professional if you are unable to keep an appointment. What may interact with this medicine? Do not take this medicine with any of the following medications: -atazanavir -ketoconazole -St. John's Wort This medicine may  also interact with the following medications: -dexamethasone -diuretics -laxatives -medicines for seizures like carbamazepine, mephobarbital, phenobarbital, phenytoin, primidone -medicines to increase blood counts like filgrastim, pegfilgrastim, sargramostim -prochlorperazine -vaccines This list may not describe all possible  interactions. Give your health care provider a list of all the medicines, herbs, non-prescription drugs, or dietary supplements you use. Also tell them if you smoke, drink alcohol, or use illegal drugs. Some items may interact with your medicine. What should I watch for while using this medicine? Your condition will be monitored carefully while you are receiving this medicine. You will need important blood work done while you are taking this medicine. This drug may make you feel generally unwell. This is not uncommon, as chemotherapy can affect healthy cells as well as cancer cells. Report any side effects. Continue your course of treatment even though you feel ill unless your doctor tells you to stop. In some cases, you may be given additional medicines to help with side effects. Follow all directions for their use. You may get drowsy or dizzy. Do not drive, use machinery, or do anything that needs mental alertness until you know how this medicine affects you. Do not stand or sit up quickly, especially if you are an older patient. This reduces the risk of dizzy or fainting spells. Call your doctor or health care professional for advice if you get a fever, chills or sore throat, or other symptoms of a cold or flu. Do not treat yourself. This drug decreases your body's ability to fight infections. Try to avoid being around people who are sick. This medicine may increase your risk to bruise or bleed. Call your doctor or health care professional if you notice any unusual bleeding. Be careful brushing and flossing your teeth or using a toothpick because you may get an infection or bleed more easily. If you have any dental work done, tell your dentist you are receiving this medicine. Avoid taking products that contain aspirin, acetaminophen, ibuprofen, naproxen, or ketoprofen unless instructed by your doctor. These medicines may hide a fever. Do not become pregnant while taking this medicine. Women should  inform their doctor if they wish to become pregnant or think they might be pregnant. There is a potential for serious side effects to an unborn child. Talk to your health care professional or pharmacist for more information. Do not breast-feed an infant while taking this medicine. What side effects may I notice from receiving this medicine? Side effects that you should report to your doctor or health care professional as soon as possible: -allergic reactions like skin rash, itching or hives, swelling of the face, lips, or tongue -low blood counts - this medicine may decrease the number of white blood cells, red blood cells and platelets. You may be at increased risk for infections and bleeding. -signs of infection - fever or chills, cough, sore throat, pain or difficulty passing urine -signs of decreased platelets or bleeding - bruising, pinpoint red spots on the skin, black, tarry stools, blood in the urine -signs of decreased red blood cells - unusually weak or tired, fainting spells, lightheadedness -breathing problems -chest pain -diarrhea -feeling faint or lightheaded, falls -flushing, runny nose, sweating during infusion -mouth sores or pain -pain, swelling, redness or irritation where injected -pain, swelling, warmth in the leg -pain, tingling, numbness in the hands or feet -problems with balance, talking, walking -stomach cramps, pain -trouble passing urine or change in the amount of urine -vomiting as to be unable to hold down drinks or  food -yellowing of the eyes or skin Side effects that usually do not require medical attention (report to your doctor or health care professional if they continue or are bothersome): -constipation -hair loss -headache -loss of appetite -nausea, vomiting -stomach upset This list may not describe all possible side effects. Call your doctor for medical advice about side effects. You may report side effects to FDA at 1-800-FDA-1088. Where should I  keep my medicine? This drug is given in a hospital or clinic and will not be stored at home. NOTE: This sheet is a summary. It may not cover all possible information. If you have questions about this medicine, talk to your doctor, pharmacist, or health care provider.  2013, [employer]. (04/27/2008 4:29:12 PM)   Leucovorin injection What is this medicine? LEUCOVORIN (loo koe VOR in) is used to prevent or treat the harmful effects of some medicines. This medicine is used to treat anemia caused by a low amount of folic acid in the body. It is also used with 5-fluorouracil (5-FU) to treat colon cancer. This medicine may be used for other purposes; ask your health care provider or pharmacist if you have questions. What should I tell my health care provider before I take this medicine? They need to know if you have any of these conditions: -anemia from low levels of vitamin B-12 in the blood -an unusual or allergic reaction to leucovorin, folic acid, other medicines, foods, dyes, or preservatives -pregnant or trying to get pregnant -breast-feeding How should I use this medicine? This medicine is for injection into a muscle or into a vein. It is given by a health care professional in a hospital or clinic setting. Talk to your pediatrician regarding the use of this medicine in children. Special care may be needed. Overdosage: If you think you have taken too much of this medicine contact a poison control center or emergency room at once. NOTE: This medicine is only for you. Do not share this medicine with others. What if I miss a dose? This does not apply. What may interact with this medicine? -capecitabine -fluorouracil -phenobarbital -phenytoin -primidone -trimethoprim-sulfamethoxazole This list may not describe all possible interactions. Give your health care provider a list of all the medicines, herbs, non-prescription drugs, or dietary supplements you use. Also tell them if you  smoke, drink alcohol, or use illegal drugs. Some items may interact with your medicine. What should I watch for while using this medicine? Your condition will be monitored carefully while you are receiving this medicine. This medicine may increase the side effects of 5-fluorouracil, 5-FU. Tell your doctor or health care professional if you have diarrhea or mouth sores that do not get better or that get worse. What side effects may I notice from receiving this medicine? Side effects that you should report to your doctor or health care professional as soon as possible: -allergic reactions like skin rash, itching or hives, swelling of the face, lips, or tongue -breathing problems -fever, infection -mouth sores -unusual bleeding or bruising -unusually weak or tired Side effects that usually do not require medical attention (report to your doctor or health care professional if they continue or are bothersome): -constipation or diarrhea -loss of appetite -nausea, vomiting This list may not describe all possible side effects. Call your doctor for medical advice about side effects. You may report side effects to FDA at 1-800-FDA-1088. Where should I keep my medicine? This drug is given in a hospital or clinic and will not be stored at home.  NOTE: This sheet is a summary. It may not cover all possible information. If you have questions about this medicine, talk to your doctor, pharmacist, or health care provider.  2012, [employer] Standard. (06/15/2008 4:50:29 PM)   Fluorouracil, 5-FU injection What is this medicine? FLUOROURACIL, 5-FU (flure oh YOOR a sil) is a chemotherapy drug. It slows the growth of cancer cells. This medicine is used to treat many types of cancer like breast cancer, colon or rectal cancer, pancreatic cancer, and stomach cancer. This medicine may be used for other purposes; ask your health care provider or pharmacist if you have questions. What should I tell my health care  provider before I take this medicine? They need to know if you have any of these conditions: -blood disorders -dihydropyrimidine dehydrogenase (DPD) deficiency -infection (especially a virus infection such as chickenpox, cold sores, or herpes) -kidney disease -liver disease -malnourished, poor nutrition -recent or ongoing radiation therapy -an unusual or allergic reaction to fluorouracil, other chemotherapy, other medicines, foods, dyes, or preservatives -pregnant or trying to get pregnant -breast-feeding How should I use this medicine? This drug is given as an infusion or injection into a vein. It is administered in a hospital or clinic by a specially trained health care professional. Talk to your pediatrician regarding the use of this medicine in children. Special care may be needed. Overdosage: If you think you have taken too much of this medicine contact a poison control center or emergency room at once. NOTE: This medicine is only for you. Do not share this medicine with others. What if I miss a dose? It is important not to miss your dose. Call your doctor or health care professional if you are unable to keep an appointment. What may interact with this medicine? -allopurinol -cimetidine -dapsone -digoxin -hydroxyurea -leucovorin -levamisole -medicines for seizures like ethotoin, fosphenytoin, phenytoin -medicines to increase blood counts like filgrastim, pegfilgrastim, sargramostim -medicines that treat or prevent blood clots like warfarin, enoxaparin, and dalteparin -methotrexate -metronidazole -pyrimethamine -some other chemotherapy drugs like busulfan, cisplatin, estramustine, vinblastine -trimethoprim -trimetrexate -vaccines Talk to your doctor or health care professional before taking any of these medicines: -acetaminophen -aspirin -ibuprofen -ketoprofen -naproxen This list may not describe all possible interactions. Give your health care provider a list of all  the medicines, herbs, non-prescription drugs, or dietary supplements you use. Also tell them if you smoke, drink alcohol, or use illegal drugs. Some items may interact with your medicine. What should I watch for while using this medicine? Visit your doctor for checks on your progress. This drug may make you feel generally unwell. This is not uncommon, as chemotherapy can affect healthy cells as well as cancer cells. Report any side effects. Continue your course of treatment even though you feel ill unless your doctor tells you to stop. In some cases, you may be given additional medicines to help with side effects. Follow all directions for their use. Call your doctor or health care professional for advice if you get a fever, chills or sore throat, or other symptoms of a cold or flu. Do not treat yourself. This drug decreases your body's ability to fight infections. Try to avoid being around people who are sick. This medicine may increase your risk to bruise or bleed. Call your doctor or health care professional if you notice any unusual bleeding. Be careful brushing and flossing your teeth or using a toothpick because you may get an infection or bleed more easily. If you have any dental work done, tell your  dentist you are receiving this medicine. Avoid taking products that contain aspirin, acetaminophen, ibuprofen, naproxen, or ketoprofen unless instructed by your doctor. These medicines may hide a fever. Do not become pregnant while taking this medicine. Women should inform their doctor if they wish to become pregnant or think they might be pregnant. There is a potential for serious side effects to an unborn child. Talk to your health care professional or pharmacist for more information. Do not breast-feed an infant while taking this medicine. Men should inform their doctor if they wish to father a child. This medicine may lower sperm counts. Do not treat diarrhea with over the counter products. Contact  your doctor if you have diarrhea that lasts more than 2 days or if it is severe and watery. This medicine can make you more sensitive to the sun. Keep out of the sun. If you cannot avoid being in the sun, wear protective clothing and use sunscreen. Do not use sun lamps or tanning beds/booths. What side effects may I notice from receiving this medicine? Side effects that you should report to your doctor or health care professional as soon as possible: -allergic reactions like skin rash, itching or hives, swelling of the face, lips, or tongue -low blood counts - this medicine may decrease the number of white blood cells, red blood cells and platelets. You may be at increased risk for infections and bleeding. -signs of infection - fever or chills, cough, sore throat, pain or difficulty passing urine -signs of decreased platelets or bleeding - bruising, pinpoint red spots on the skin, black, tarry stools, blood in the urine -signs of decreased red blood cells - unusually weak or tired, fainting spells, lightheadedness -breathing problems -changes in vision -chest pain -mouth sores -nausea and vomiting -pain, swelling, redness at site where injected -pain, tingling, numbness in the hands or feet -redness, swelling, or sores on hands or feet -stomach pain -unusual bleeding Side effects that usually do not require medical attention (report to your doctor or health care professional if they continue or are bothersome): -changes in finger or toe nails -diarrhea -dry or itchy skin -hair loss -headache -loss of appetite -sensitivity of eyes to the light -stomach upset -unusually teary eyes This list may not describe all possible side effects. Call your doctor for medical advice about side effects. You may report side effects to FDA at 1-800-FDA-1088. Where should I keep my medicine? This drug is given in a hospital or clinic and will not be stored at home. NOTE: This sheet is a summary. It may  not cover all possible information. If you have questions about this medicine, talk to your doctor, pharmacist, or health care provider.  2012, [employer] Standard. (04/14/2008 1:53:16 PM)

## 2013-04-29 NOTE — Progress Notes (Signed)
Port a cath placed 04/27/13. Site unremarkable. Avastin treatment on hold for this cycle. Chemotherapy drug teaching complete. Pt and wife voice understanding. Written info given on all drugs. Side effects reviewed, pt understands to begin Imodium at the first sign of diarrhea. Port site care and pump troubleshooting reviewed, pt and wife voiced understanding. Spill kit given. All questions answered.

## 2013-04-30 ENCOUNTER — Telehealth: Payer: Self-pay | Admitting: *Deleted

## 2013-04-30 ENCOUNTER — Other Ambulatory Visit: Payer: Self-pay | Admitting: *Deleted

## 2013-04-30 DIAGNOSIS — C3490 Malignant neoplasm of unspecified part of unspecified bronchus or lung: Secondary | ICD-10-CM

## 2013-04-30 MED ORDER — HYDROCODONE-ACETAMINOPHEN 5-325 MG PO TABS: ORAL_TABLET | ORAL | Status: DC

## 2013-04-30 NOTE — Telephone Encounter (Signed)
NO NAUSEA OR VOMITING. PT. IS EATING BUT ENCOURAGED TO PUSH MORE FLUIDS. SUGGESTIONS GIVEN FOR JELLO, POPSICLES, AND SOUP. NO DIARRHEA. NO PROBLEM WITH HIS MOUTH. PT. HAS HIS CHEMO MEDICATION SHEET AVAILABLE FOR REFERENCE. NO PROBLEMS OR QUESTIONS AT THIS TIME. PT. WILL CALL THIS OFFICE OR THE ON CALL PHYSICIAN IF THE OFFICE IS CLOSE SHOULD THE NEED ARISE.

## 2013-05-01 ENCOUNTER — Ambulatory Visit (HOSPITAL_BASED_OUTPATIENT_CLINIC_OR_DEPARTMENT_OTHER): Payer: Medicare Other

## 2013-05-01 VITALS — BP 137/75 | HR 96 | Temp 97.1°F

## 2013-05-01 DIAGNOSIS — C185 Malignant neoplasm of splenic flexure: Secondary | ICD-10-CM

## 2013-05-01 DIAGNOSIS — C786 Secondary malignant neoplasm of retroperitoneum and peritoneum: Secondary | ICD-10-CM

## 2013-05-01 DIAGNOSIS — C189 Malignant neoplasm of colon, unspecified: Secondary | ICD-10-CM

## 2013-05-01 MED ORDER — SODIUM CHLORIDE 0.9 % IJ SOLN
10.0000 mL | INTRAMUSCULAR | Status: DC | PRN
  Administered 2013-05-01: 10 mL
  Filled 2013-05-01: qty 10

## 2013-05-01 MED ORDER — HEPARIN SOD (PORK) LOCK FLUSH 100 UNIT/ML IV SOLN
500.0000 [IU] | Freq: Once | INTRAVENOUS | Status: AC | PRN
  Administered 2013-05-01: 500 [IU]
  Filled 2013-05-01: qty 5

## 2013-05-13 ENCOUNTER — Telehealth: Payer: Self-pay | Admitting: Oncology

## 2013-05-13 ENCOUNTER — Ambulatory Visit (HOSPITAL_BASED_OUTPATIENT_CLINIC_OR_DEPARTMENT_OTHER): Payer: Medicare Other

## 2013-05-13 ENCOUNTER — Ambulatory Visit: Payer: Medicare Other | Admitting: Nurse Practitioner

## 2013-05-13 ENCOUNTER — Other Ambulatory Visit (HOSPITAL_BASED_OUTPATIENT_CLINIC_OR_DEPARTMENT_OTHER): Payer: Medicare Other | Admitting: Lab

## 2013-05-13 VITALS — BP 128/62 | HR 86 | Temp 96.7°F | Resp 19 | Ht 67.0 in | Wt 181.7 lb

## 2013-05-13 DIAGNOSIS — C3490 Malignant neoplasm of unspecified part of unspecified bronchus or lung: Secondary | ICD-10-CM

## 2013-05-13 DIAGNOSIS — Z5111 Encounter for antineoplastic chemotherapy: Secondary | ICD-10-CM

## 2013-05-13 DIAGNOSIS — C185 Malignant neoplasm of splenic flexure: Secondary | ICD-10-CM

## 2013-05-13 DIAGNOSIS — C786 Secondary malignant neoplasm of retroperitoneum and peritoneum: Secondary | ICD-10-CM

## 2013-05-13 DIAGNOSIS — C189 Malignant neoplasm of colon, unspecified: Secondary | ICD-10-CM

## 2013-05-13 LAB — CBC WITH DIFFERENTIAL/PLATELET
BASO%: 0.6 % (ref 0.0–2.0)
HCT: 45.4 % (ref 38.4–49.9)
LYMPH%: 26.2 % (ref 14.0–49.0)
MCH: 30.8 pg (ref 27.2–33.4)
MCHC: 34.1 g/dL (ref 32.0–36.0)
MONO#: 0.3 10*3/uL (ref 0.1–0.9)
NEUT%: 56.7 % (ref 39.0–75.0)
Platelets: 150 10*3/uL (ref 140–400)
WBC: 3.3 10*3/uL — ABNORMAL LOW (ref 4.0–10.3)

## 2013-05-13 MED ORDER — ONDANSETRON 16 MG/50ML IVPB (CHCC)
16.0000 mg | Freq: Once | INTRAVENOUS | Status: AC
  Administered 2013-05-13: 16 mg via INTRAVENOUS

## 2013-05-13 MED ORDER — FLUOROURACIL CHEMO INJECTION 2.5 GM/50ML
400.0000 mg/m2 | Freq: Once | INTRAVENOUS | Status: AC
  Administered 2013-05-13: 800 mg via INTRAVENOUS
  Filled 2013-05-13: qty 16

## 2013-05-13 MED ORDER — SODIUM CHLORIDE 0.9 % IV SOLN
2400.0000 mg/m2 | INTRAVENOUS | Status: DC
  Administered 2013-05-13: 4750 mg via INTRAVENOUS
  Filled 2013-05-13: qty 95

## 2013-05-13 MED ORDER — IRINOTECAN HCL CHEMO INJECTION 100 MG/5ML
360.0000 mg | Freq: Once | INTRAVENOUS | Status: AC
  Administered 2013-05-13: 360 mg via INTRAVENOUS
  Filled 2013-05-13: qty 18

## 2013-05-13 MED ORDER — SODIUM CHLORIDE 0.9 % IV SOLN
Freq: Once | INTRAVENOUS | Status: AC
  Administered 2013-05-13: 12:00:00 via INTRAVENOUS

## 2013-05-13 MED ORDER — LEUCOVORIN CALCIUM INJECTION 350 MG
800.0000 mg | Freq: Once | INTRAVENOUS | Status: AC
  Administered 2013-05-13: 800 mg via INTRAVENOUS
  Filled 2013-05-13: qty 40

## 2013-05-13 MED ORDER — DEXAMETHASONE SODIUM PHOSPHATE 20 MG/5ML IJ SOLN
20.0000 mg | Freq: Once | INTRAMUSCULAR | Status: AC
  Administered 2013-05-13: 20 mg via INTRAVENOUS

## 2013-05-13 MED ORDER — ATROPINE SULFATE 1 MG/ML IJ SOLN
0.5000 mg | Freq: Once | INTRAMUSCULAR | Status: AC | PRN
  Administered 2013-05-13: 0.5 mg via INTRAVENOUS

## 2013-05-13 NOTE — Patient Instructions (Addendum)
[facility] Discharge Instructions for Patients Receiving Chemotherapy  Today you received the following chemotherapy agents Camptosar/Leucovorin/5FU  To help prevent nausea and vomiting after your treatment, we encourage you to take your nausea medication as prescribed.   If you develop nausea and vomiting that is not controlled by your nausea medication, call the clinic. If it is after clinic hours your family physician or the after hours number for the clinic or go to the Emergency Department.   BELOW ARE SYMPTOMS THAT SHOULD BE REPORTED IMMEDIATELY:  *FEVER GREATER THAN 100.5 F  *CHILLS WITH OR WITHOUT FEVER  NAUSEA AND VOMITING THAT IS NOT CONTROLLED WITH YOUR NAUSEA MEDICATION  *UNUSUAL SHORTNESS OF BREATH  *UNUSUAL BRUISING OR BLEEDING  TENDERNESS IN MOUTH AND THROAT WITH OR WITHOUT PRESENCE OF ULCERS  *URINARY PROBLEMS  *BOWEL PROBLEMS  UNUSUAL RASH Items with * indicate a potential emergency and should be followed up as soon as possible.  Feel free to call the clinic you have any questions or concerns. The clinic phone number is [phone].   I have been informed and understand all the instructions given to me. I know to contact the clinic, my physician, or go to the Emergency Department if any problems should occur. I do not have any questions at this time, but understand that I may call the clinic during office hours   should I have any questions or need assistance in obtaining follow up care.    __________________________________________  _____________  __________ Signature of Patient or Authorized Representative            Date                   Time    __________________________________________ Nurse's Signature

## 2013-05-13 NOTE — Progress Notes (Signed)
OFFICE PROGRESS NOTE  Interval history:   [name] is a 72 year old man with local intra-abdominal recurrence of colon cancer. He was initially diagnosed with stage III colon cancer in July 2012 status post left colectomy 07/09/2011. He developed a rise in the CEA tumor marker and 2 intra-abdominal soft tissue densities on followup CT scan in November 2012 while on adjuvant Xeloda chemotherapy. Oxaliplatin and Avastin were added to the regimen with a steady fall in the CEA. He developed cumulative neurotoxicity related to the oxaliplatin necessitating discontinuation. Xeloda was continued on a 1 week on/1 week off schedule and Avastin was continued every 2 weeks. There was a recent progressive rise in the CEA. Restaging CT evaluation 04/09/2013 showed increase in size of previously noted peritoneal implants.  He underwent placement of a Port-A-Cath on 04/27/2013 and began treatment on the FOLFIRI regimen on 04/29/2013.  He is seen today for scheduled followup. He noted insomnia the night of the first treatment. During the irinotecan infusion he noted "watery eyes and increased salavation". He had no diarrhea or abdominal cramping during the infusion. He estimates "3 bouts" of diarrhea over the past 2 weeks which responded to Imodium promptly. Last week he had some stomach cramps. He noted some mild urinary hesitancy around the time of the first treatment. He had a few mouth sores the week following treatment. The mouth sores did not impact his ability to eat or drink. He has had a few slight nosebleeds. No other bleeding.   Objective: Blood pressure 128/62, pulse 86, temperature 96.7 F (35.9 C), temperature source Oral, resp. rate 19, height 5\' 7"  (1.702 m), weight 181 lb 11.2 oz (82.419 kg).  Oropharynx is without thrush or ulceration. Lungs are clear. Regular cardiac rhythm. Port-A-Cath site is without erythema. Abdomen is soft and nontender. No hepatomegaly. Extremities are without edema. Motor  strength 5 over 5.  Lab Results: Lab Results  Component Value Date   WBC 3.3* 05/13/2013   HGB 15.5 05/13/2013   HCT 45.4 05/13/2013   MCV 90.3 05/13/2013   PLT 150 05/13/2013    Chemistry:    Chemistry      Component Value Date/Time   NA 138 04/29/2013 0849   NA 138 04/27/2013 1300   NA 139 10/15/2011 1601   K 4.1 04/29/2013 0849   K 4.2 04/27/2013 1300   K 4.3 10/15/2011 1601   CL 104 04/29/2013 0849   CL 104 04/27/2013 1300   CL 101 10/15/2011 1601   CO2 25 04/29/2013 0849   CO2 24 04/27/2013 1300   CO2 30 10/15/2011 1601   BUN 13.7 04/29/2013 0849   BUN 13 04/27/2013 1300   BUN 17 10/15/2011 1601   CREATININE 1.2 04/29/2013 0849   CREATININE 1.15 04/27/2013 1300   CREATININE 1.1 10/15/2011 1601      Component Value Date/Time   CALCIUM 9.5 04/29/2013 0849   CALCIUM 9.9 04/27/2013 1300   CALCIUM 9.3 10/15/2011 1601   ALKPHOS 61 04/29/2013 0849   ALKPHOS 50 12/31/2012 1632   ALKPHOS 59 10/15/2011 1601   AST 15 04/29/2013 0849   AST 19 12/31/2012 1632   AST 35 10/15/2011 1601   ALT 12 04/29/2013 0849   ALT 14 12/31/2012 1632   BILITOT 1.10 04/29/2013 0849   BILITOT 0.6 12/31/2012 1632   BILITOT 1.30 10/15/2011 1601       Studies/Results: Ir Fluoro Guide Cv Line Right  04/27/2013   *RADIOLOGY REPORT*  Clinical data: Metastatic colon carcinoma, needs access for chemotherapy.  TUNNELED PORT CATHETER PLACEMENT WITH ULTRASOUND AND FLUOROSCOPIC GUIDANCE  Technique: The procedure, risks, benefits, and alternatives were explained to the patient.  Questions regarding the procedure were encouraged and answered.  The patient understands and consents to the procedure.  As antibiotic prophylaxis, cefazolin 2 grams IV was ordered pre- procedure and administered intravenously within one hour of incision.  Patency of the right IJ vein was confirmed with ultrasound with image documentation. An appropriate skin site was determined. Skin site was marked. Region was prepped using maximum barrier technique including cap and mask,  sterile gown, sterile gloves, large sterile sheet, and Chlorhexidine   as cutaneous antisepsis.   The region was infiltrated locally with 1% lidocaine.  Intravenous Fentanyl and Versed were administered as conscious sedation during continuous cardiorespiratory monitoring by the radiology RN, with a total moderate sedation time of 20 minutes.  Under real-time ultrasound guidance, the right IJ vein was accessed with a 21 gauge micropuncture needle; the needle tip within the vein was confirmed with ultrasound image documentation.   Needle was exchanged over a 018 guidewire for transitional dilator which allowed passage of the [facility] wire into the IVC. Over this, the transitional dilator was exchanged for a 5 [place] MPA catheter. A small incision was made on the right anterior chest wall and a subcutaneous pocket fashioned. The power-injectable port was positioned and its catheter tunneled to the right IJ dermatotomy site. The MPA catheter was exchanged over an Amplatz wire for a peel-away sheath, through which the port catheter, which had been trimmed to the appropriate length, was advanced and positioned under fluoroscopy with its tip at the cavoatrial junction. Spot chest radiograph  confirms good catheter position and no pneumothorax. The pocket was closed with deep interrupted and subcuticular continuous 3-0 Monocryl sutures. The port was  flushed per protocol. The incisions were covered with Dermabond then covered with a sterile dressing. No immediate complication.  Fluoroscopy time: 20 seconds  IMPRESSION Technically successful right IJ power-injectable port catheter placement. Ready for routine use.   Original Report Authenticated By: D. Andria Rhein, MD   Ir US Guide Vasc Access Right  04/27/2013   *RADIOLOGY REPORT*  Clinical data: Metastatic colon carcinoma, needs access for chemotherapy.  TUNNELED PORT CATHETER PLACEMENT WITH ULTRASOUND AND FLUOROSCOPIC GUIDANCE  Technique: The procedure, risks, benefits,  and alternatives were explained to the patient.  Questions regarding the procedure were encouraged and answered.  The patient understands and consents to the procedure.  As antibiotic prophylaxis, cefazolin 2 grams IV was ordered pre- procedure and administered intravenously within one hour of incision.  Patency of the right IJ vein was confirmed with ultrasound with image documentation. An appropriate skin site was determined. Skin site was marked. Region was prepped using maximum barrier technique including cap and mask, sterile gown, sterile gloves, large sterile sheet, and Chlorhexidine   as cutaneous antisepsis.   The region was infiltrated locally with 1% lidocaine.  Intravenous Fentanyl and Versed were administered as conscious sedation during continuous cardiorespiratory monitoring by the radiology RN, with a total moderate sedation time of 20 minutes.  Under real-time ultrasound guidance, the right IJ vein was accessed with a 21 gauge micropuncture needle; the needle tip within the vein was confirmed with ultrasound image documentation.   Needle was exchanged over a 018 guidewire for transitional dilator which allowed passage of the [facility] wire into the IVC. Over this, the transitional dilator was exchanged for a 5 [place] MPA catheter. A small incision was made on the right  anterior chest wall and a subcutaneous pocket fashioned. The power-injectable port was positioned and its catheter tunneled to the right IJ dermatotomy site. The MPA catheter was exchanged over an Amplatz wire for a peel-away sheath, through which the port catheter, which had been trimmed to the appropriate length, was advanced and positioned under fluoroscopy with its tip at the cavoatrial junction. Spot chest radiograph  confirms good catheter position and no pneumothorax. The pocket was closed with deep interrupted and subcuticular continuous 3-0 Monocryl sutures. The port was  flushed per protocol. The incisions were covered with  Dermabond then covered with a sterile dressing. No immediate complication.  Fluoroscopy time: 20 seconds  IMPRESSION Technically successful right IJ power-injectable port catheter placement. Ready for routine use.   Original Report Authenticated By: D. Andria Rhein, MD    Medications: I have reviewed the patient's current medications.  Assessment/Plan:  1. 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 He began treatment on the FOLFIRI regimen on 04/29/2013. Avastin on hold due to recent Port-A-Cath placement. 2. Status post Port-A-Cath placement 04/27/2013. 3. Status post resection of a stage I non-small cell lung cancer left upper lung with postop brachytherapy June 2007. 4. 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 Radiographic complete regression on Avastin based chemotherapy. 5. Oxaliplatin neuropathy. Stable. 6. Type 2 diabetes. 7. History of nephrolithiasis. 8. Status post bilateral corneal transplants.  Disposition-Mr. Wengert appears stable. He tolerated cycle 1 of FOLFIRI well overall. Plan to continue on a 2 week schedule with cycle 2 today. Avastin will likely be resumed with cycle 3. He will return for a followup visit with Dr. Cyndie Chime on 06/22/2013. He will contact the office in the interim with any problems.  Plan reviewed with Dr. Cyndie Chime.  Lonna Cobb ANP/GNP-BC

## 2013-05-14 ENCOUNTER — Other Ambulatory Visit: Payer: Self-pay | Admitting: *Deleted

## 2013-05-14 DIAGNOSIS — C189 Malignant neoplasm of colon, unspecified: Secondary | ICD-10-CM

## 2013-05-14 MED ORDER — LORAZEPAM 0.5 MG PO TABS: 0.5000 mg | ORAL_TABLET | ORAL | Status: DC

## 2013-05-14 NOTE — Progress Notes (Signed)
Patient called and decided to accept medication for insomnia that Lonna Cobb NP advised him to take at visit yesterday.  Discussed with Misty Stanley and will call in ativan to [facility].

## 2013-05-14 NOTE — Telephone Encounter (Signed)
Called patient and let him know that ativan has been called in to [facility] as he requested.

## 2013-05-15 ENCOUNTER — Ambulatory Visit (HOSPITAL_BASED_OUTPATIENT_CLINIC_OR_DEPARTMENT_OTHER): Payer: Medicare Other

## 2013-05-15 VITALS — BP 149/81 | HR 77 | Temp 97.2°F

## 2013-05-15 DIAGNOSIS — C801 Malignant (primary) neoplasm, unspecified: Secondary | ICD-10-CM

## 2013-05-15 DIAGNOSIS — C189 Malignant neoplasm of colon, unspecified: Secondary | ICD-10-CM

## 2013-05-15 MED ORDER — SODIUM CHLORIDE 0.9 % IJ SOLN
10.0000 mL | INTRAMUSCULAR | Status: DC | PRN
  Administered 2013-05-15: 10 mL
  Filled 2013-05-15: qty 10

## 2013-05-15 MED ORDER — HEPARIN SOD (PORK) LOCK FLUSH 100 UNIT/ML IV SOLN
500.0000 [IU] | Freq: Once | INTRAVENOUS | Status: AC | PRN
  Administered 2013-05-15: 500 [IU]
  Filled 2013-05-15: qty 5

## 2013-05-20 ENCOUNTER — Other Ambulatory Visit: Payer: Self-pay | Admitting: Dermatology

## 2013-05-27 ENCOUNTER — Ambulatory Visit (HOSPITAL_BASED_OUTPATIENT_CLINIC_OR_DEPARTMENT_OTHER): Payer: Medicare Other

## 2013-05-27 ENCOUNTER — Other Ambulatory Visit (HOSPITAL_COMMUNITY): Payer: Self-pay | Admitting: Oncology

## 2013-05-27 ENCOUNTER — Other Ambulatory Visit (HOSPITAL_BASED_OUTPATIENT_CLINIC_OR_DEPARTMENT_OTHER): Payer: Medicare Other | Admitting: Lab

## 2013-05-27 VITALS — BP 137/78 | HR 83 | Temp 97.1°F

## 2013-05-27 DIAGNOSIS — C786 Secondary malignant neoplasm of retroperitoneum and peritoneum: Secondary | ICD-10-CM

## 2013-05-27 DIAGNOSIS — Z5112 Encounter for antineoplastic immunotherapy: Secondary | ICD-10-CM

## 2013-05-27 DIAGNOSIS — C189 Malignant neoplasm of colon, unspecified: Secondary | ICD-10-CM

## 2013-05-27 DIAGNOSIS — C3490 Malignant neoplasm of unspecified part of unspecified bronchus or lung: Secondary | ICD-10-CM

## 2013-05-27 DIAGNOSIS — Z5111 Encounter for antineoplastic chemotherapy: Secondary | ICD-10-CM

## 2013-05-27 DIAGNOSIS — C185 Malignant neoplasm of splenic flexure: Secondary | ICD-10-CM

## 2013-05-27 LAB — CBC WITH DIFFERENTIAL/PLATELET
BASO%: 0.6 % (ref 0.0–2.0)
Basophils Absolute: 0 10*3/uL (ref 0.0–0.1)
EOS%: 2.6 % (ref 0.0–7.0)
HCT: 48.9 % (ref 38.4–49.9)
LYMPH%: 23.9 % (ref 14.0–49.0)
MCH: 30.1 pg (ref 27.2–33.4)
MCHC: 33.5 g/dL (ref 32.0–36.0)
MCV: 89.7 fL (ref 79.3–98.0)
MONO%: 9.4 % (ref 0.0–14.0)
NEUT%: 63.5 % (ref 39.0–75.0)
Platelets: 137 10*3/uL — ABNORMAL LOW (ref 140–400)
lymph#: 1.3 10*3/uL (ref 0.9–3.3)

## 2013-05-27 MED ORDER — FLUOROURACIL CHEMO INJECTION 2.5 GM/50ML
400.0000 mg/m2 | Freq: Once | INTRAVENOUS | Status: AC
  Administered 2013-05-27: 800 mg via INTRAVENOUS
  Filled 2013-05-27: qty 16

## 2013-05-27 MED ORDER — LEUCOVORIN CALCIUM INJECTION 350 MG
800.0000 mg | Freq: Once | INTRAVENOUS | Status: AC
  Administered 2013-05-27: 800 mg via INTRAVENOUS
  Filled 2013-05-27: qty 40

## 2013-05-27 MED ORDER — SODIUM CHLORIDE 0.9 % IV SOLN
5.0000 mg/kg | Freq: Once | INTRAVENOUS | Status: AC
  Administered 2013-05-27: 400 mg via INTRAVENOUS
  Filled 2013-05-27: qty 16

## 2013-05-27 MED ORDER — SODIUM CHLORIDE 0.9 % IV SOLN
Freq: Once | INTRAVENOUS | Status: AC
  Administered 2013-05-27: 14:00:00 via INTRAVENOUS

## 2013-05-27 MED ORDER — DEXAMETHASONE SODIUM PHOSPHATE 20 MG/5ML IJ SOLN
20.0000 mg | Freq: Once | INTRAMUSCULAR | Status: AC
  Administered 2013-05-27: 20 mg via INTRAVENOUS

## 2013-05-27 MED ORDER — SODIUM CHLORIDE 0.9 % IJ SOLN
10.0000 mL | INTRAMUSCULAR | Status: DC | PRN
  Filled 2013-05-27: qty 10

## 2013-05-27 MED ORDER — IRINOTECAN HCL CHEMO INJECTION 100 MG/5ML
360.0000 mg | Freq: Once | INTRAVENOUS | Status: AC
  Administered 2013-05-27: 360 mg via INTRAVENOUS
  Filled 2013-05-27: qty 18

## 2013-05-27 MED ORDER — SODIUM CHLORIDE 0.9 % IV SOLN
2400.0000 mg/m2 | INTRAVENOUS | Status: DC
  Administered 2013-05-27: 4750 mg via INTRAVENOUS
  Filled 2013-05-27: qty 95

## 2013-05-27 MED ORDER — ATROPINE SULFATE 1 MG/ML IJ SOLN
0.5000 mg | Freq: Once | INTRAMUSCULAR | Status: AC | PRN
  Administered 2013-05-27: 0.5 mg via INTRAVENOUS

## 2013-05-27 MED ORDER — ONDANSETRON 16 MG/50ML IVPB (CHCC)
16.0000 mg | Freq: Once | INTRAVENOUS | Status: AC
  Administered 2013-05-27: 16 mg via INTRAVENOUS

## 2013-05-27 MED ORDER — HEPARIN SOD (PORK) LOCK FLUSH 100 UNIT/ML IV SOLN
500.0000 [IU] | Freq: Once | INTRAVENOUS | Status: DC | PRN
  Filled 2013-05-27: qty 5

## 2013-05-27 NOTE — Patient Instructions (Addendum)
[facility] Discharge Instructions for Patients Receiving Chemotherapy  Today you received the following chemotherapy agents:  Avastin, Camptosar, Leucovorin and 5-FU.  To help prevent nausea and vomiting after your treatment, we encourage you to take your nausea medication as ordered per MD.   If you develop nausea and vomiting that is not controlled by your nausea medication, call the clinic.   BELOW ARE SYMPTOMS THAT SHOULD BE REPORTED IMMEDIATELY:  *FEVER GREATER THAN 100.5 F  *CHILLS WITH OR WITHOUT FEVER  NAUSEA AND VOMITING THAT IS NOT CONTROLLED WITH YOUR NAUSEA MEDICATION  *UNUSUAL SHORTNESS OF BREATH  *UNUSUAL BRUISING OR BLEEDING  TENDERNESS IN MOUTH AND THROAT WITH OR WITHOUT PRESENCE OF ULCERS  *URINARY PROBLEMS  *BOWEL PROBLEMS  UNUSUAL RASH Items with * indicate a potential emergency and should be followed up as soon as possible.  Feel free to call the clinic you have any questions or concerns. The clinic phone number is [phone].    

## 2013-05-28 ENCOUNTER — Telehealth: Payer: Self-pay | Admitting: *Deleted

## 2013-05-28 NOTE — Telephone Encounter (Signed)
Received call from pt stating that he has some mouth tenderness but no ulcers & asked for recommendation.  Informed to try dilute solution of baking soda & salt water to rinse mouth after meals & hs & prn & may also try biotene.  Encouraged to call if worse.  He also is concerned about his schedule in July & reports that he is scheduled for 7/2 for rx & pump d/c on 7/4 & that someone was supposed to be looking at this.  Message left for chemo scheduler/Michelle to see if this could be moved one day to 7/1 start chemo & pump d/c 06/25/13.

## 2013-05-29 ENCOUNTER — Other Ambulatory Visit: Payer: Self-pay | Admitting: *Deleted

## 2013-05-29 ENCOUNTER — Telehealth: Payer: Self-pay | Admitting: Oncology

## 2013-05-29 ENCOUNTER — Ambulatory Visit (HOSPITAL_BASED_OUTPATIENT_CLINIC_OR_DEPARTMENT_OTHER): Payer: Medicare Other

## 2013-05-29 VITALS — BP 136/70 | HR 83 | Temp 98.3°F | Resp 18

## 2013-05-29 DIAGNOSIS — Z452 Encounter for adjustment and management of vascular access device: Secondary | ICD-10-CM

## 2013-05-29 DIAGNOSIS — C185 Malignant neoplasm of splenic flexure: Secondary | ICD-10-CM

## 2013-05-29 DIAGNOSIS — C189 Malignant neoplasm of colon, unspecified: Secondary | ICD-10-CM

## 2013-05-29 DIAGNOSIS — C786 Secondary malignant neoplasm of retroperitoneum and peritoneum: Secondary | ICD-10-CM

## 2013-05-29 MED ORDER — HEPARIN SOD (PORK) LOCK FLUSH 100 UNIT/ML IV SOLN
500.0000 [IU] | Freq: Once | INTRAVENOUS | Status: AC | PRN
  Administered 2013-05-29: 500 [IU]
  Filled 2013-05-29: qty 5

## 2013-05-29 MED ORDER — SODIUM CHLORIDE 0.9 % IJ SOLN
10.0000 mL | INTRAMUSCULAR | Status: DC | PRN
  Administered 2013-05-29: 10 mL
  Filled 2013-05-29: qty 10

## 2013-05-29 NOTE — Telephone Encounter (Signed)
Pt came by and gave him appt for lab, MD and chemo for June and July 2014, lab for 7/1 cancelled per [facility]

## 2013-06-08 ENCOUNTER — Other Ambulatory Visit: Payer: Self-pay | Admitting: *Deleted

## 2013-06-08 ENCOUNTER — Telehealth: Payer: Self-pay | Admitting: *Deleted

## 2013-06-08 DIAGNOSIS — C189 Malignant neoplasm of colon, unspecified: Secondary | ICD-10-CM

## 2013-06-08 MED ORDER — LIDOCAINE VISCOUS 2 % MT SOLN: OROMUCOSAL | Status: DC

## 2013-06-08 NOTE — Telephone Encounter (Signed)
Received call this am from pt stating that he's had quite a time with his mouth & throat being sore.  He reports some mouth ulcers after last chemo which he had 2 wks ago.  He also states that he is due treatment again this wed & wonders if there is anything that he can do to help such as mouthwash to relieve or reduce this problem.  Note to Dr. Cyndie Chime.  Call back # is [phone].

## 2013-06-08 NOTE — Telephone Encounter (Signed)
Left pt a message that we are going to postpone his treatment for this week. Will call in viscous lidocaine gargle and spit every 2 hours as needed for mouth sores

## 2013-06-09 ENCOUNTER — Other Ambulatory Visit: Payer: Self-pay | Admitting: Oncology

## 2013-06-10 ENCOUNTER — Other Ambulatory Visit: Payer: Medicare Other | Admitting: Lab

## 2013-06-10 ENCOUNTER — Ambulatory Visit: Payer: Medicare Other

## 2013-06-11 ENCOUNTER — Telehealth: Payer: Self-pay | Admitting: *Deleted

## 2013-06-11 ENCOUNTER — Telehealth: Payer: Self-pay | Admitting: Oncology

## 2013-06-11 NOTE — Telephone Encounter (Signed)
Spoke with pt instructing him re: date/times of appts since treatment held 06/10/13.  Pt verbalized understanding and stated he would look at MyChart for update.

## 2013-06-11 NOTE — Telephone Encounter (Signed)
Per staff message and POF I have scheduled appts.  JMW  

## 2013-06-11 NOTE — Telephone Encounter (Signed)
pt called and was upset the way that his appt were scheduled he request that 7.23 and 7.30 appt be changed to 1:00pm...he was not happy that the chemos for the end of July were not sched...i advised that chemo is sched 2 past MD visit.Marland KitchenMarland Kitchen

## 2013-06-17 ENCOUNTER — Ambulatory Visit (HOSPITAL_BASED_OUTPATIENT_CLINIC_OR_DEPARTMENT_OTHER): Payer: Medicare Other

## 2013-06-17 ENCOUNTER — Other Ambulatory Visit (HOSPITAL_BASED_OUTPATIENT_CLINIC_OR_DEPARTMENT_OTHER): Payer: Medicare Other | Admitting: Lab

## 2013-06-17 VITALS — BP 126/77 | HR 77 | Temp 96.9°F | Resp 20

## 2013-06-17 DIAGNOSIS — C185 Malignant neoplasm of splenic flexure: Secondary | ICD-10-CM

## 2013-06-17 DIAGNOSIS — C786 Secondary malignant neoplasm of retroperitoneum and peritoneum: Secondary | ICD-10-CM

## 2013-06-17 DIAGNOSIS — C189 Malignant neoplasm of colon, unspecified: Secondary | ICD-10-CM

## 2013-06-17 DIAGNOSIS — Z5111 Encounter for antineoplastic chemotherapy: Secondary | ICD-10-CM

## 2013-06-17 LAB — UA PROTEIN, DIPSTICK - CHCC: Protein, ur: <30 mg/dL

## 2013-06-17 LAB — CBC WITH DIFFERENTIAL/PLATELET
BASO%: 0.6 % (ref 0.0–2.0)
EOS%: 5.2 % (ref 0.0–7.0)
MCH: 30.1 pg (ref 27.2–33.4)
MCHC: 33.6 g/dL (ref 32.0–36.0)
NEUT%: 61.8 % (ref 39.0–75.0)
RBC: 5.35 10*6/uL (ref 4.20–5.82)
RDW: 18.1 % — ABNORMAL HIGH (ref 11.0–14.6)
WBC: 5.4 10*3/uL (ref 4.0–10.3)
lymph#: 1.2 10*3/uL (ref 0.9–3.3)

## 2013-06-17 LAB — COMPREHENSIVE METABOLIC PANEL (CC13)
ALT: 18 U/L (ref 0–55)
AST: 21 U/L (ref 5–34)
Albumin: 3.4 g/dL — ABNORMAL LOW (ref 3.5–5.0)
Alkaline Phosphatase: 59 U/L (ref 40–150)
BUN: 16 mg/dL (ref 7.0–26.0)
Potassium: 4.2 mEq/L (ref 3.5–5.1)
Sodium: 141 mEq/L (ref 136–145)

## 2013-06-17 MED ORDER — DEXTROSE 5 % IV SOLN
135.0000 mg/m2 | Freq: Once | INTRAVENOUS | Status: AC
  Administered 2013-06-17: 266 mg via INTRAVENOUS
  Filled 2013-06-17: qty 13.3

## 2013-06-17 MED ORDER — DEXAMETHASONE SODIUM PHOSPHATE 20 MG/5ML IJ SOLN
20.0000 mg | Freq: Once | INTRAMUSCULAR | Status: AC
  Administered 2013-06-17: 20 mg via INTRAVENOUS

## 2013-06-17 MED ORDER — FLUOROURACIL CHEMO INJECTION 2.5 GM/50ML
300.0000 mg/m2 | Freq: Once | INTRAVENOUS | Status: AC
  Administered 2013-06-17: 600 mg via INTRAVENOUS
  Filled 2013-06-17: qty 12

## 2013-06-17 MED ORDER — FLUOROURACIL CHEMO INJECTION 5 GM/100ML
1800.0000 mg/m2 | INTRAVENOUS | Status: DC
  Administered 2013-06-17: 3550 mg via INTRAVENOUS
  Filled 2013-06-17: qty 71

## 2013-06-17 MED ORDER — SODIUM CHLORIDE 0.9 % IV SOLN
Freq: Once | INTRAVENOUS | Status: AC
  Administered 2013-06-17: 12:00:00 via INTRAVENOUS

## 2013-06-17 MED ORDER — SODIUM CHLORIDE 0.9 % IV SOLN
5.0000 mg/kg | Freq: Once | INTRAVENOUS | Status: AC
  Administered 2013-06-17: 400 mg via INTRAVENOUS
  Filled 2013-06-17: qty 16

## 2013-06-17 MED ORDER — ATROPINE SULFATE 1 MG/ML IJ SOLN
0.5000 mg | Freq: Once | INTRAMUSCULAR | Status: AC | PRN
  Administered 2013-06-17: 0.5 mg via INTRAVENOUS

## 2013-06-17 MED ORDER — ONDANSETRON 16 MG/50ML IVPB (CHCC)
16.0000 mg | Freq: Once | INTRAVENOUS | Status: AC
  Administered 2013-06-17: 16 mg via INTRAVENOUS

## 2013-06-17 MED ORDER — LEUCOVORIN CALCIUM INJECTION 350 MG
406.0000 mg/m2 | Freq: Once | INTRAVENOUS | Status: AC
  Administered 2013-06-17: 800 mg via INTRAVENOUS
  Filled 2013-06-17: qty 40

## 2013-06-17 NOTE — Patient Instructions (Signed)
[facility] Discharge Instructions for Patients Receiving Chemotherapy  Today you received the following chemotherapy agents FOLFIRI/Avastin  To help prevent nausea and vomiting after your treatment, we encourage you to take your nausea medication as needed   If you develop nausea and vomiting that is not controlled by your nausea medication, call the clinic.   BELOW ARE SYMPTOMS THAT SHOULD BE REPORTED IMMEDIATELY:  *FEVER GREATER THAN 100.5 F  *CHILLS WITH OR WITHOUT FEVER  NAUSEA AND VOMITING THAT IS NOT CONTROLLED WITH YOUR NAUSEA MEDICATION  *UNUSUAL SHORTNESS OF BREATH  *UNUSUAL BRUISING OR BLEEDING  TENDERNESS IN MOUTH AND THROAT WITH OR WITHOUT PRESENCE OF ULCERS  *URINARY PROBLEMS  *BOWEL PROBLEMS  UNUSUAL RASH Items with * indicate a potential emergency and should be followed up as soon as possible.  Feel free to call the clinic you have any questions or concerns. The clinic phone number is [phone].    

## 2013-06-18 ENCOUNTER — Telehealth: Payer: Self-pay | Admitting: *Deleted

## 2013-06-18 LAB — CEA: CEA: 9.1 ng/mL — ABNORMAL HIGH (ref 0.0–5.0)

## 2013-06-18 NOTE — Telephone Encounter (Signed)
Message copied by Sabino Snipes on Thu Jun 18, 2013 10:59 AM ------      Message from: Levert Feinstein      Created: Thu Jun 18, 2013  9:39 AM       Call pt: CEA down from 18 last month to 9 ------

## 2013-06-18 NOTE — Telephone Encounter (Signed)
Message left earlier today for return call & pt called back.  Reported that CEA is down from 18 to 9 per Dr Cyndie Chime & he expressed appreciation for call.

## 2013-06-19 ENCOUNTER — Ambulatory Visit (HOSPITAL_BASED_OUTPATIENT_CLINIC_OR_DEPARTMENT_OTHER): Payer: Medicare Other

## 2013-06-19 VITALS — BP 136/73 | HR 78 | Temp 97.0°F | Resp 18

## 2013-06-19 DIAGNOSIS — C189 Malignant neoplasm of colon, unspecified: Secondary | ICD-10-CM

## 2013-06-19 DIAGNOSIS — C801 Malignant (primary) neoplasm, unspecified: Secondary | ICD-10-CM

## 2013-06-19 MED ORDER — HEPARIN SOD (PORK) LOCK FLUSH 100 UNIT/ML IV SOLN
500.0000 [IU] | Freq: Once | INTRAVENOUS | Status: AC | PRN
  Administered 2013-06-19: 500 [IU]
  Filled 2013-06-19: qty 5

## 2013-06-19 MED ORDER — SODIUM CHLORIDE 0.9 % IJ SOLN
10.0000 mL | INTRAMUSCULAR | Status: DC | PRN
  Administered 2013-06-19: 10 mL
  Filled 2013-06-19: qty 10

## 2013-06-22 ENCOUNTER — Other Ambulatory Visit (HOSPITAL_BASED_OUTPATIENT_CLINIC_OR_DEPARTMENT_OTHER): Payer: Medicare Other | Admitting: Lab

## 2013-06-22 ENCOUNTER — Ambulatory Visit (HOSPITAL_BASED_OUTPATIENT_CLINIC_OR_DEPARTMENT_OTHER): Payer: Medicare Other | Admitting: Oncology

## 2013-06-22 ENCOUNTER — Telehealth: Payer: Self-pay | Admitting: Oncology

## 2013-06-22 VITALS — BP 146/79 | HR 98 | Temp 96.8°F | Resp 20 | Ht 67.0 in | Wt 176.4 lb

## 2013-06-22 DIAGNOSIS — C189 Malignant neoplasm of colon, unspecified: Secondary | ICD-10-CM

## 2013-06-22 DIAGNOSIS — C801 Malignant (primary) neoplasm, unspecified: Secondary | ICD-10-CM

## 2013-06-22 DIAGNOSIS — C3490 Malignant neoplasm of unspecified part of unspecified bronchus or lung: Secondary | ICD-10-CM

## 2013-06-22 LAB — CBC WITH DIFFERENTIAL/PLATELET
BASO%: 0.6 % (ref 0.0–2.0)
EOS%: 2 % (ref 0.0–7.0)
HCT: 48.6 % (ref 38.4–49.9)
LYMPH%: 9.8 % — ABNORMAL LOW (ref 14.0–49.0)
MCH: 29.7 pg (ref 27.2–33.4)
MCHC: 33.8 g/dL (ref 32.0–36.0)
NEUT%: 85.2 % — ABNORMAL HIGH (ref 39.0–75.0)
Platelets: 145 10*3/uL (ref 140–400)
RBC: 5.53 10*6/uL (ref 4.20–5.82)
WBC: 13 10*3/uL — ABNORMAL HIGH (ref 4.0–10.3)
lymph#: 1.3 10*3/uL (ref 0.9–3.3)

## 2013-06-22 LAB — COMPREHENSIVE METABOLIC PANEL (CC13)
ALT: 11 U/L (ref 0–55)
AST: 12 U/L (ref 5–34)
Creatinine: 1.3 mg/dL (ref 0.7–1.3)
Sodium: 137 mEq/L (ref 136–145)
Total Bilirubin: 0.95 mg/dL (ref 0.20–1.20)
Total Protein: 6.4 g/dL (ref 6.4–8.3)

## 2013-06-22 NOTE — Telephone Encounter (Signed)
gave pt appt for lab, MD, chemo for July  and August 2014

## 2013-06-23 ENCOUNTER — Ambulatory Visit: Payer: Medicare Other

## 2013-06-23 ENCOUNTER — Other Ambulatory Visit: Payer: Medicare Other | Admitting: Lab

## 2013-06-23 ENCOUNTER — Telehealth: Payer: Self-pay | Admitting: *Deleted

## 2013-06-23 NOTE — Telephone Encounter (Signed)
Per staff phone call and POF I have schedueld appts.  JMW  

## 2013-06-23 NOTE — Progress Notes (Signed)
Hematology and Oncology Follow Up Visit  [name] [id number] 1941/05/09 72 y.o. 06/23/2013 6:48 PM   Principle Diagnosis: Encounter Diagnoses  Name Primary?  . NEOPLASM, MALIGNANT, LUNG, NON-SMALL CELL   . 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 He developed a rise in CEA tumor marker and 2 intra-abdominal soft tissue densities on followup CT scan done in November 2012 while on adjuvant Xeloda chemotherapy. Oxaliplatin and Avastin were added to his regimen. Oxaliplatinum was started 11/26/2011. He had a steady fall in his CEA tumor marker from peak value of 12.6 on 11/02/2011 to 2.3 by July 10. Unfortunately, he developed cumulative neurotoxicity from the Oxaliplatinum. We were following his symptoms closely but he had a sudden worsening of neuropathy, hands greater than feet, and was having trouble buttoning buttons and uncomfortable dysesthesias. I had to stop the Oxaliplatinum. He continued on Xeloda one week on 1 week off and every two-week Avastin until April 2014 when CEA began to slowly rise again to a peak of 17.7 by May 7. Followup CT scans done April 16 showed a new, indeterminate nodule at the right lung base 1.8 cm. There was an increase in the size and number of multiple peritoneal nodules largest up to 1.5 cm and a 2.1 cm soft tissue implant along the surface of the gastric antrum.  Reviewed his chemotherapy plan. He was not exposed to Irinotecan. Beginning on May 7, I started him on the FOLFIRI regimen. I had to make a 25% dose reduction after the first treatment due to significant mucositis. He is tolerating treatment much better. He is using biotene oral rinse.  We are already seeing a fall in his CEA down to 9.1 as of 06/17/2013 after just 3 doses.  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   Medications: reviewed  Allergies: No Known Allergies  Review of Systems: Constitutional:   Generalized fatigue. Appetite remains good. Weight continues to slowly fall down from 180 to on May 21 to 176 today.  Respiratory: No cough or dyspnea Cardiovascular:  No chest pain or palpitations Gastrointestinal: No significant diarrhea. He takes Imodium and usually does not have more than 2 loose stools daily while on chemotherapy. Genito-Urinary: No urinary tract symptoms Musculoskeletal: No muscle or bone pain Neurologic: No headache. Chronic decreased vision status post bilateral corneal transplants Skin: No rash or ecchymosis. No hand-foot syndrome. Remaining ROS negative.  Physical Exam: Blood pressure 146/79, pulse 98, temperature 96.8 F (36 C), temperature source Oral, resp. rate 20, height 5\' 7"  (1.702 m), weight 176 lb 6.4 oz (80.015 kg). Wt Readings from Last 3 Encounters:  06/22/13 176 lb 6.4 oz (80.015 kg)  05/13/13 181 lb 11.2 oz (82.419 kg)  04/13/13 180 lb 1.6 oz (81.693 kg)     General appearance: Thin Caucasian man HENNT: Pharynx no erythema exudate or ulcer Lymph nodes: No adenopathy Breasts: Lungs: Clear to auscultation resonant to percussion Heart: Regular rhythm no murmur Abdomen: Soft, nontender, no mass, no organomegaly Extremities: No edema, no calf tenderness Musculoskeletal: No joint deformities GU: Vascular: No cyanosis Neurologic: Motor strength 5 over 5, reflexes 1+ symmetric Skin: No rash or ecchymosis  Lab Results: Lab Results  Component Value Date   WBC 13.0* 06/22/2013  HGB 16.5 06/22/2013   HCT 48.6 06/22/2013   MCV 87.9 06/22/2013   PLT 145 06/22/2013     Chemistry      Component Value Date/Time   NA 137 06/22/2013 1519   NA 138 04/27/2013 1300   NA 139 10/15/2011 1601   K 4.2 06/22/2013 1519   K 4.2 04/27/2013 1300   K 4.3 10/15/2011 1601   CL 108*  06/17/2013 1200   CL 104 04/27/2013 1300   CL 101 10/15/2011 1601   CO2 26 06/22/2013 1519   CO2 24 04/27/2013 1300   CO2 30 10/15/2011 1601   BUN 18.2 06/22/2013 1519   BUN 13 04/27/2013 1300   BUN 17 10/15/2011 1601   CREATININE 1.3 06/22/2013 1519   CREATININE 1.15 04/27/2013 1300   CREATININE 1.1 10/15/2011 1601      Component Value Date/Time   CALCIUM 10.0 06/22/2013 1519   CALCIUM 9.9 04/27/2013 1300   CALCIUM 9.3 10/15/2011 1601   ALKPHOS 59 06/22/2013 1519   ALKPHOS 50 12/31/2012 1632   ALKPHOS 59 10/15/2011 1601   AST 12 06/22/2013 1519   AST 19 12/31/2012 1632   AST 35 10/15/2011 1601   ALT 11 06/22/2013 1519   ALT 14 12/31/2012 1632   BILITOT 0.95 06/22/2013 1519   BILITOT 0.6 12/31/2012 1632   BILITOT 1.30 10/15/2011 1601       Radiological Studies: No results found.  Impression: #1. Colon cancer metastatic to peritoneum He showing an early response to FOLFIRI/Avastin Plan: Continue current regimen  #2. Metachronous primary bilateral non-small cell lung cancers Initial lesion in the left upper lung treated with surgical resection and brachy therapy in June, 2007with no recurrence. Second tumor in the right lung treated with observation alone with complete regression when Avastin was started for his colon cancer. There is now a new nodule in the right lung. Hard to know whether  this is a metastasis from his colon cancer or a recurrence of his lung cancer. Under the circumstances, no further diagnostic evaluation planned at this time.  #3. Oxaliplatin related neuropathy  #4. Type 2 diabetes  #5. History of nephrolithiasis  #6. Status post bilateral corneal transplants.  Plan:   CC:Marland Kitchen    Levert Feinstein, MD 7/1/20146:48 PM

## 2013-06-24 ENCOUNTER — Other Ambulatory Visit: Payer: Medicare Other | Admitting: Lab

## 2013-06-24 ENCOUNTER — Ambulatory Visit: Payer: Medicare Other

## 2013-06-25 ENCOUNTER — Encounter: Payer: Self-pay | Admitting: Oncology

## 2013-06-29 ENCOUNTER — Telehealth: Payer: Self-pay | Admitting: Oncology

## 2013-06-29 NOTE — Telephone Encounter (Signed)
Called pt and lefty message regarding lab, chemo and MD  on July and August 2014

## 2013-07-01 ENCOUNTER — Ambulatory Visit (HOSPITAL_BASED_OUTPATIENT_CLINIC_OR_DEPARTMENT_OTHER): Payer: Medicare Other

## 2013-07-01 ENCOUNTER — Other Ambulatory Visit (HOSPITAL_BASED_OUTPATIENT_CLINIC_OR_DEPARTMENT_OTHER): Payer: Medicare Other | Admitting: Lab

## 2013-07-01 VITALS — BP 127/73 | HR 78 | Temp 97.1°F

## 2013-07-01 DIAGNOSIS — C185 Malignant neoplasm of splenic flexure: Secondary | ICD-10-CM

## 2013-07-01 DIAGNOSIS — C786 Secondary malignant neoplasm of retroperitoneum and peritoneum: Secondary | ICD-10-CM

## 2013-07-01 DIAGNOSIS — C189 Malignant neoplasm of colon, unspecified: Secondary | ICD-10-CM

## 2013-07-01 DIAGNOSIS — C3490 Malignant neoplasm of unspecified part of unspecified bronchus or lung: Secondary | ICD-10-CM

## 2013-07-01 DIAGNOSIS — Z5111 Encounter for antineoplastic chemotherapy: Secondary | ICD-10-CM

## 2013-07-01 LAB — COMPREHENSIVE METABOLIC PANEL (CC13)
ALT: 20 U/L (ref 0–55)
AST: 18 U/L (ref 5–34)
Chloride: 105 mEq/L (ref 98–109)
Creatinine: 1.2 mg/dL (ref 0.7–1.3)
Total Bilirubin: 1.28 mg/dL — ABNORMAL HIGH (ref 0.20–1.20)

## 2013-07-01 LAB — CBC WITH DIFFERENTIAL/PLATELET
BASO%: 0.3 % (ref 0.0–2.0)
EOS%: 4.5 % (ref 0.0–7.0)
HCT: 49.5 % (ref 38.4–49.9)
LYMPH%: 14.6 % (ref 14.0–49.0)
MCH: 30.3 pg (ref 27.2–33.4)
MCHC: 33.9 g/dL (ref 32.0–36.0)
NEUT%: 73.3 % (ref 39.0–75.0)
RBC: 5.54 10*6/uL (ref 4.20–5.82)
lymph#: 1.1 10*3/uL (ref 0.9–3.3)

## 2013-07-01 MED ORDER — FLUOROURACIL CHEMO INJECTION 2.5 GM/50ML
300.0000 mg/m2 | Freq: Once | INTRAVENOUS | Status: AC
  Administered 2013-07-01: 600 mg via INTRAVENOUS
  Filled 2013-07-01: qty 12

## 2013-07-01 MED ORDER — LEUCOVORIN CALCIUM INJECTION 350 MG
406.0000 mg/m2 | Freq: Once | INTRAVENOUS | Status: AC
  Administered 2013-07-01: 800 mg via INTRAVENOUS
  Filled 2013-07-01: qty 40

## 2013-07-01 MED ORDER — SODIUM CHLORIDE 0.9 % IV SOLN
5.0000 mg/kg | Freq: Once | INTRAVENOUS | Status: AC
  Administered 2013-07-01: 400 mg via INTRAVENOUS
  Filled 2013-07-01: qty 16

## 2013-07-01 MED ORDER — ATROPINE SULFATE 1 MG/ML IJ SOLN
0.5000 mg | Freq: Once | INTRAMUSCULAR | Status: AC | PRN
  Administered 2013-07-01: 0.5 mg via INTRAVENOUS

## 2013-07-01 MED ORDER — SODIUM CHLORIDE 0.9 % IV SOLN
Freq: Once | INTRAVENOUS | Status: AC
  Administered 2013-07-01: 14:00:00 via INTRAVENOUS

## 2013-07-01 MED ORDER — DEXAMETHASONE SODIUM PHOSPHATE 20 MG/5ML IJ SOLN
20.0000 mg | Freq: Once | INTRAMUSCULAR | Status: AC
  Administered 2013-07-01: 20 mg via INTRAVENOUS

## 2013-07-01 MED ORDER — IRINOTECAN HCL CHEMO INJECTION 100 MG/5ML
135.0000 mg/m2 | Freq: Once | INTRAVENOUS | Status: AC
  Administered 2013-07-01: 266 mg via INTRAVENOUS
  Filled 2013-07-01: qty 13.3

## 2013-07-01 MED ORDER — ONDANSETRON 16 MG/50ML IVPB (CHCC)
16.0000 mg | Freq: Once | INTRAVENOUS | Status: AC
  Administered 2013-07-01: 16 mg via INTRAVENOUS

## 2013-07-01 MED ORDER — SODIUM CHLORIDE 0.9 % IV SOLN
1800.0000 mg/m2 | INTRAVENOUS | Status: DC
  Administered 2013-07-01: 3550 mg via INTRAVENOUS
  Filled 2013-07-01: qty 71

## 2013-07-01 NOTE — Patient Instructions (Addendum)
[facility] Discharge Instructions for Patients Receiving Chemotherapy  Today you received the following chemotherapy agents avastin, 74fu, leucovorin, irinotecan  To help prevent nausea and vomiting after your treatment, we encourage you to take your nausea medication as needed   If you develop nausea and vomiting that is not controlled by your nausea medication, call the clinic.   BELOW ARE SYMPTOMS THAT SHOULD BE REPORTED IMMEDIATELY:  *FEVER GREATER THAN 100.5 F  *CHILLS WITH OR WITHOUT FEVER  NAUSEA AND VOMITING THAT IS NOT CONTROLLED WITH YOUR NAUSEA MEDICATION  *UNUSUAL SHORTNESS OF BREATH  *UNUSUAL BRUISING OR BLEEDING  TENDERNESS IN MOUTH AND THROAT WITH OR WITHOUT PRESENCE OF ULCERS  *URINARY PROBLEMS  *BOWEL PROBLEMS  UNUSUAL RASH Items with * indicate a potential emergency and should be followed up as soon as possible.  Feel free to call the clinic you have any questions or concerns. The clinic phone number is [phone].

## 2013-07-03 ENCOUNTER — Ambulatory Visit (HOSPITAL_BASED_OUTPATIENT_CLINIC_OR_DEPARTMENT_OTHER): Payer: Medicare Other

## 2013-07-03 ENCOUNTER — Telehealth: Payer: Self-pay | Admitting: *Deleted

## 2013-07-03 VITALS — BP 117/70 | HR 87 | Temp 97.2°F

## 2013-07-03 DIAGNOSIS — C189 Malignant neoplasm of colon, unspecified: Secondary | ICD-10-CM

## 2013-07-03 DIAGNOSIS — Z452 Encounter for adjustment and management of vascular access device: Secondary | ICD-10-CM

## 2013-07-03 MED ORDER — HEPARIN SOD (PORK) LOCK FLUSH 100 UNIT/ML IV SOLN
500.0000 [IU] | Freq: Once | INTRAVENOUS | Status: AC | PRN
  Administered 2013-07-03: 500 [IU]
  Filled 2013-07-03: qty 5

## 2013-07-03 MED ORDER — SODIUM CHLORIDE 0.9 % IJ SOLN
10.0000 mL | INTRAMUSCULAR | Status: DC | PRN
  Administered 2013-07-03: 10 mL
  Filled 2013-07-03: qty 10

## 2013-07-03 NOTE — Telephone Encounter (Signed)
Message copied by Gala Romney on Fri Jul 03, 2013  2:25 PM ------      Message from: Levert Feinstein      Created: Fri Jul 03, 2013  2:02 PM       Call pt - CEA continues to fall  @ 7.5 ------

## 2013-07-03 NOTE — Telephone Encounter (Signed)
Informed pt of CEA results.  Pt verbalized understanding and expressed appreciation for call.

## 2013-07-15 ENCOUNTER — Ambulatory Visit (HOSPITAL_BASED_OUTPATIENT_CLINIC_OR_DEPARTMENT_OTHER): Payer: Medicare Other

## 2013-07-15 ENCOUNTER — Other Ambulatory Visit (HOSPITAL_BASED_OUTPATIENT_CLINIC_OR_DEPARTMENT_OTHER): Payer: Medicare Other | Admitting: Lab

## 2013-07-15 ENCOUNTER — Other Ambulatory Visit: Payer: Medicare Other | Admitting: Lab

## 2013-07-15 VITALS — BP 125/64 | HR 98 | Temp 98.7°F

## 2013-07-15 DIAGNOSIS — C3490 Malignant neoplasm of unspecified part of unspecified bronchus or lung: Secondary | ICD-10-CM

## 2013-07-15 DIAGNOSIS — C185 Malignant neoplasm of splenic flexure: Secondary | ICD-10-CM

## 2013-07-15 DIAGNOSIS — C189 Malignant neoplasm of colon, unspecified: Secondary | ICD-10-CM

## 2013-07-15 DIAGNOSIS — Z5111 Encounter for antineoplastic chemotherapy: Secondary | ICD-10-CM

## 2013-07-15 DIAGNOSIS — C786 Secondary malignant neoplasm of retroperitoneum and peritoneum: Secondary | ICD-10-CM

## 2013-07-15 DIAGNOSIS — Z5112 Encounter for antineoplastic immunotherapy: Secondary | ICD-10-CM

## 2013-07-15 LAB — CBC WITH DIFFERENTIAL/PLATELET
Basophils Absolute: 0 10*3/uL (ref 0.0–0.1)
Eosinophils Absolute: 0.3 10*3/uL (ref 0.0–0.5)
HGB: 15.4 g/dL (ref 13.0–17.1)
LYMPH%: 18 % (ref 14.0–49.0)
MCV: 87.3 fL (ref 79.3–98.0)
MONO#: 0.9 10*3/uL (ref 0.1–0.9)
MONO%: 12.4 % (ref 0.0–14.0)
NEUT#: 4.5 10*3/uL (ref 1.5–6.5)
Platelets: 132 10*3/uL — ABNORMAL LOW (ref 140–400)
RBC: 5.19 10*6/uL (ref 4.20–5.82)
WBC: 6.9 10*3/uL (ref 4.0–10.3)
nRBC: 0 % (ref 0–0)

## 2013-07-15 LAB — TECHNOLOGIST REVIEW

## 2013-07-15 MED ORDER — DEXAMETHASONE SODIUM PHOSPHATE 20 MG/5ML IJ SOLN
20.0000 mg | Freq: Once | INTRAMUSCULAR | Status: AC
  Administered 2013-07-15: 20 mg via INTRAVENOUS

## 2013-07-15 MED ORDER — ONDANSETRON 16 MG/50ML IVPB (CHCC)
16.0000 mg | Freq: Once | INTRAVENOUS | Status: AC
  Administered 2013-07-15: 16 mg via INTRAVENOUS

## 2013-07-15 MED ORDER — ATROPINE SULFATE 1 MG/ML IJ SOLN
0.5000 mg | Freq: Once | INTRAMUSCULAR | Status: AC | PRN
  Administered 2013-07-15: 0.5 mg via INTRAVENOUS

## 2013-07-15 MED ORDER — SODIUM CHLORIDE 0.9 % IV SOLN
5.0000 mg/kg | Freq: Once | INTRAVENOUS | Status: AC
  Administered 2013-07-15: 400 mg via INTRAVENOUS
  Filled 2013-07-15: qty 16

## 2013-07-15 MED ORDER — SODIUM CHLORIDE 0.9 % IV SOLN
1800.0000 mg/m2 | INTRAVENOUS | Status: DC
  Administered 2013-07-15: 3550 mg via INTRAVENOUS
  Filled 2013-07-15: qty 71

## 2013-07-15 MED ORDER — FLUOROURACIL CHEMO INJECTION 2.5 GM/50ML
300.0000 mg/m2 | Freq: Once | INTRAVENOUS | Status: AC
  Administered 2013-07-15: 600 mg via INTRAVENOUS
  Filled 2013-07-15: qty 12

## 2013-07-15 MED ORDER — IRINOTECAN HCL CHEMO INJECTION 100 MG/5ML
135.0000 mg/m2 | Freq: Once | INTRAVENOUS | Status: AC
  Administered 2013-07-15: 266 mg via INTRAVENOUS
  Filled 2013-07-15: qty 13.3

## 2013-07-15 MED ORDER — LEUCOVORIN CALCIUM INJECTION 350 MG
400.0000 mg/m2 | Freq: Once | INTRAVENOUS | Status: AC
  Administered 2013-07-15: 788 mg via INTRAVENOUS
  Filled 2013-07-15: qty 39.4

## 2013-07-15 NOTE — Patient Instructions (Signed)
[facility] Discharge Instructions for Patients Receiving Chemotherapy  Today you received the following chemotherapy agents: 5FU, Irinotecan, Leucovorin, and Avastin.  To help prevent nausea and vomiting after your treatment, we encourage you to take your nausea medication.   If you develop nausea and vomiting that is not controlled by your nausea medication, call the clinic.   BELOW ARE SYMPTOMS THAT SHOULD BE REPORTED IMMEDIATELY:  *FEVER GREATER THAN 100.5 F  *CHILLS WITH OR WITHOUT FEVER  NAUSEA AND VOMITING THAT IS NOT CONTROLLED WITH YOUR NAUSEA MEDICATION  *UNUSUAL SHORTNESS OF BREATH  *UNUSUAL BRUISING OR BLEEDING  TENDERNESS IN MOUTH AND THROAT WITH OR WITHOUT PRESENCE OF ULCERS  *URINARY PROBLEMS  *BOWEL PROBLEMS  UNUSUAL RASH Items with * indicate a potential emergency and should be followed up as soon as possible.  Feel free to call the clinic you have any questions or concerns. The clinic phone number is [phone].

## 2013-07-15 NOTE — Progress Notes (Signed)
Pt reports fever x past 4 nights with Tmax 102.6.  States starts with chills, develops fever, and fever resolves after 1-2 hr without medication or any other intervention.  Pt denies any symptoms of infection or any other complaints.  No fever at this time.  Information with vitals and labs reported to Dr. Cyndie Chime.  Per Dr. Cyndie Chime- ok to proceed with chemo.  Draw blood cultures from port.

## 2013-07-16 ENCOUNTER — Other Ambulatory Visit: Payer: Self-pay | Admitting: Certified Registered Nurse Anesthetist

## 2013-07-17 ENCOUNTER — Ambulatory Visit (HOSPITAL_BASED_OUTPATIENT_CLINIC_OR_DEPARTMENT_OTHER): Payer: Medicare Other

## 2013-07-17 VITALS — BP 121/71 | HR 79 | Temp 97.0°F

## 2013-07-17 DIAGNOSIS — Z452 Encounter for adjustment and management of vascular access device: Secondary | ICD-10-CM

## 2013-07-17 DIAGNOSIS — C189 Malignant neoplasm of colon, unspecified: Secondary | ICD-10-CM

## 2013-07-17 DIAGNOSIS — C185 Malignant neoplasm of splenic flexure: Secondary | ICD-10-CM

## 2013-07-17 DIAGNOSIS — C786 Secondary malignant neoplasm of retroperitoneum and peritoneum: Secondary | ICD-10-CM

## 2013-07-17 MED ORDER — SODIUM CHLORIDE 0.9 % IJ SOLN
10.0000 mL | INTRAMUSCULAR | Status: DC | PRN
  Administered 2013-07-17: 10 mL
  Filled 2013-07-17: qty 10

## 2013-07-17 MED ORDER — HEPARIN SOD (PORK) LOCK FLUSH 100 UNIT/ML IV SOLN
500.0000 [IU] | Freq: Once | INTRAVENOUS | Status: AC | PRN
  Administered 2013-07-17: 500 [IU]
  Filled 2013-07-17: qty 5

## 2013-07-21 LAB — CULTURE, BLOOD (SINGLE)

## 2013-07-22 ENCOUNTER — Other Ambulatory Visit: Payer: Medicare Other

## 2013-07-22 ENCOUNTER — Other Ambulatory Visit: Payer: Medicare Other | Admitting: Lab

## 2013-07-27 ENCOUNTER — Other Ambulatory Visit (HOSPITAL_BASED_OUTPATIENT_CLINIC_OR_DEPARTMENT_OTHER): Payer: Medicare Other | Admitting: Lab

## 2013-07-27 ENCOUNTER — Ambulatory Visit (HOSPITAL_COMMUNITY)
Admission: RE | Admit: 2013-07-27 | Discharge: 2013-07-27 | Disposition: A | Discharge status: Home / Self Care | Payer: Medicare Other | Source: Ambulatory Visit | Attending: Oncology | Admitting: Oncology

## 2013-07-27 ENCOUNTER — Encounter (HOSPITAL_COMMUNITY): Payer: Self-pay

## 2013-07-27 DIAGNOSIS — C801 Malignant (primary) neoplasm, unspecified: Secondary | ICD-10-CM | POA: Insufficient documentation

## 2013-07-27 DIAGNOSIS — C189 Malignant neoplasm of colon, unspecified: Secondary | ICD-10-CM

## 2013-07-27 DIAGNOSIS — I708 Atherosclerosis of other arteries: Secondary | ICD-10-CM | POA: Insufficient documentation

## 2013-07-27 DIAGNOSIS — L905 Scar conditions and fibrosis of skin: Secondary | ICD-10-CM | POA: Insufficient documentation

## 2013-07-27 DIAGNOSIS — D1779 Benign lipomatous neoplasm of other sites: Secondary | ICD-10-CM | POA: Insufficient documentation

## 2013-07-27 DIAGNOSIS — K5710 Diverticulosis of small intestine without perforation or abscess without bleeding: Secondary | ICD-10-CM | POA: Insufficient documentation

## 2013-07-27 DIAGNOSIS — C185 Malignant neoplasm of splenic flexure: Secondary | ICD-10-CM

## 2013-07-27 DIAGNOSIS — C3490 Malignant neoplasm of unspecified part of unspecified bronchus or lung: Secondary | ICD-10-CM | POA: Insufficient documentation

## 2013-07-27 DIAGNOSIS — I700 Atherosclerosis of aorta: Secondary | ICD-10-CM | POA: Insufficient documentation

## 2013-07-27 DIAGNOSIS — I2510 Atherosclerotic heart disease of native coronary artery without angina pectoris: Secondary | ICD-10-CM | POA: Insufficient documentation

## 2013-07-27 DIAGNOSIS — J438 Other emphysema: Secondary | ICD-10-CM | POA: Insufficient documentation

## 2013-07-27 DIAGNOSIS — Q762 Congenital spondylolisthesis: Secondary | ICD-10-CM | POA: Insufficient documentation

## 2013-07-27 DIAGNOSIS — K7689 Other specified diseases of liver: Secondary | ICD-10-CM | POA: Insufficient documentation

## 2013-07-27 LAB — COMPREHENSIVE METABOLIC PANEL (CC13)
ALT: 12 U/L (ref 0–55)
CO2: 28 mEq/L (ref 22–29)
Chloride: 101 mEq/L (ref 98–109)
Potassium: 4.8 mEq/L (ref 3.5–5.1)
Sodium: 137 mEq/L (ref 136–145)
Total Bilirubin: 1.18 mg/dL (ref 0.20–1.20)
Total Protein: 6.9 g/dL (ref 6.4–8.3)

## 2013-07-27 LAB — CBC WITH DIFFERENTIAL/PLATELET
Eosinophils Absolute: 0.3 10*3/uL (ref 0.0–0.5)
MONO#: 0.8 10*3/uL (ref 0.1–0.9)
MONO%: 11.5 % (ref 0.0–14.0)
NEUT#: 4.7 10*3/uL (ref 1.5–6.5)
RBC: 5.22 10*6/uL (ref 4.20–5.82)
RDW: 17.5 % — ABNORMAL HIGH (ref 11.0–14.6)
WBC: 6.6 10*3/uL (ref 4.0–10.3)
lymph#: 0.8 10*3/uL — ABNORMAL LOW (ref 0.9–3.3)
nRBC: 0 % (ref 0–0)

## 2013-07-27 MED ORDER — IOHEXOL 300 MG/ML  SOLN
100.0000 mL | Freq: Once | INTRAMUSCULAR | Status: AC | PRN
  Administered 2013-07-27: 100 mL via INTRAVENOUS

## 2013-07-28 LAB — CEA: CEA: 5.7 ng/mL — ABNORMAL HIGH (ref 0.0–5.0)

## 2013-07-29 ENCOUNTER — Other Ambulatory Visit: Payer: Self-pay

## 2013-07-29 ENCOUNTER — Other Ambulatory Visit: Payer: Self-pay | Admitting: Oncology

## 2013-07-29 ENCOUNTER — Ambulatory Visit (HOSPITAL_BASED_OUTPATIENT_CLINIC_OR_DEPARTMENT_OTHER): Payer: Medicare Other

## 2013-07-29 VITALS — BP 126/64 | HR 91 | Temp 97.1°F

## 2013-07-29 DIAGNOSIS — C786 Secondary malignant neoplasm of retroperitoneum and peritoneum: Secondary | ICD-10-CM

## 2013-07-29 DIAGNOSIS — C189 Malignant neoplasm of colon, unspecified: Secondary | ICD-10-CM

## 2013-07-29 DIAGNOSIS — Z5111 Encounter for antineoplastic chemotherapy: Secondary | ICD-10-CM

## 2013-07-29 DIAGNOSIS — Z5112 Encounter for antineoplastic immunotherapy: Secondary | ICD-10-CM

## 2013-07-29 DIAGNOSIS — C185 Malignant neoplasm of splenic flexure: Secondary | ICD-10-CM

## 2013-07-29 MED ORDER — SODIUM CHLORIDE 0.9 % IV SOLN
5.0000 mg/kg | Freq: Once | INTRAVENOUS | Status: AC
  Administered 2013-07-29: 400 mg via INTRAVENOUS
  Filled 2013-07-29: qty 16

## 2013-07-29 MED ORDER — IRINOTECAN HCL CHEMO INJECTION 100 MG/5ML
135.0000 mg/m2 | Freq: Once | INTRAVENOUS | Status: AC
  Administered 2013-07-29: 266 mg via INTRAVENOUS
  Filled 2013-07-29: qty 13.3

## 2013-07-29 MED ORDER — DEXAMETHASONE SODIUM PHOSPHATE 20 MG/5ML IJ SOLN
20.0000 mg | Freq: Once | INTRAMUSCULAR | Status: AC
  Administered 2013-07-29: 20 mg via INTRAVENOUS

## 2013-07-29 MED ORDER — SODIUM CHLORIDE 0.9 % IV SOLN
Freq: Once | INTRAVENOUS | Status: AC
  Administered 2013-07-29: 14:00:00 via INTRAVENOUS

## 2013-07-29 MED ORDER — ATROPINE SULFATE 1 MG/ML IJ SOLN
0.5000 mg | Freq: Once | INTRAMUSCULAR | Status: AC | PRN
  Administered 2013-07-29: 0.5 mg via INTRAVENOUS

## 2013-07-29 MED ORDER — ONDANSETRON 16 MG/50ML IVPB (CHCC)
16.0000 mg | Freq: Once | INTRAVENOUS | Status: AC
  Administered 2013-07-29: 16 mg via INTRAVENOUS

## 2013-07-29 MED ORDER — FLUOROURACIL CHEMO INJECTION 2.5 GM/50ML
300.0000 mg/m2 | Freq: Once | INTRAVENOUS | Status: AC
  Administered 2013-07-29: 600 mg via INTRAVENOUS
  Filled 2013-07-29: qty 12

## 2013-07-29 MED ORDER — SODIUM CHLORIDE 0.9 % IV SOLN
1800.0000 mg/m2 | INTRAVENOUS | Status: DC
  Administered 2013-07-29: 3550 mg via INTRAVENOUS
  Filled 2013-07-29: qty 71

## 2013-07-29 MED ORDER — HEPARIN SOD (PORK) LOCK FLUSH 100 UNIT/ML IV SOLN
500.0000 [IU] | Freq: Once | INTRAVENOUS | Status: DC | PRN
  Filled 2013-07-29: qty 5

## 2013-07-29 MED ORDER — LEUCOVORIN CALCIUM INJECTION 350 MG
400.0000 mg/m2 | Freq: Once | INTRAVENOUS | Status: AC
  Administered 2013-07-29: 788 mg via INTRAVENOUS
  Filled 2013-07-29: qty 39.4

## 2013-07-29 MED ORDER — SODIUM CHLORIDE 0.9 % IJ SOLN
10.0000 mL | INTRAMUSCULAR | Status: DC | PRN
  Filled 2013-07-29: qty 10

## 2013-07-29 NOTE — Patient Instructions (Signed)
[facility] Discharge Instructions for Patients Receiving Chemotherapy  Today you received the following chemotherapy agents FolFIRI Avastin  To help prevent nausea and vomiting after your treatment, we encourage you to take your nausea medication as directed. If you develop nausea and vomiting that is not controlled by your nausea medication, call the clinic.   BELOW ARE SYMPTOMS THAT SHOULD BE REPORTED IMMEDIATELY:  *FEVER GREATER THAN 100.5 F  *CHILLS WITH OR WITHOUT FEVER  NAUSEA AND VOMITING THAT IS NOT CONTROLLED WITH YOUR NAUSEA MEDICATION  *UNUSUAL SHORTNESS OF BREATH  *UNUSUAL BRUISING OR BLEEDING  TENDERNESS IN MOUTH AND THROAT WITH OR WITHOUT PRESENCE OF ULCERS  *URINARY PROBLEMS  *BOWEL PROBLEMS  UNUSUAL RASH Items with * indicate a potential emergency and should be followed up as soon as possible.  Feel free to call the clinic you have any questions or concerns. The clinic phone number is [phone].

## 2013-07-29 NOTE — Progress Notes (Signed)
Urine protein dipstick- trace.

## 2013-07-30 ENCOUNTER — Telehealth: Payer: Self-pay | Admitting: *Deleted

## 2013-07-30 NOTE — Telephone Encounter (Signed)
Message copied by Gala Romney on Thu Jul 30, 2013 12:10 PM ------      Message from: Levert Feinstein      Created: Wed Jul 29, 2013  9:22 AM       Call pt - CT shows response to Rx!! ------

## 2013-07-30 NOTE — Telephone Encounter (Signed)
Notified pt CT shows response to Rx. Pt verbalized understanding---expressed 'Thank You'

## 2013-07-31 ENCOUNTER — Ambulatory Visit (HOSPITAL_BASED_OUTPATIENT_CLINIC_OR_DEPARTMENT_OTHER): Payer: Medicare Other

## 2013-07-31 VITALS — BP 124/70 | HR 87 | Temp 96.9°F | Resp 16

## 2013-07-31 DIAGNOSIS — C189 Malignant neoplasm of colon, unspecified: Secondary | ICD-10-CM

## 2013-07-31 DIAGNOSIS — C801 Malignant (primary) neoplasm, unspecified: Secondary | ICD-10-CM

## 2013-07-31 MED ORDER — HEPARIN SOD (PORK) LOCK FLUSH 100 UNIT/ML IV SOLN
500.0000 [IU] | Freq: Once | INTRAVENOUS | Status: AC | PRN
  Administered 2013-07-31: 500 [IU]
  Filled 2013-07-31: qty 5

## 2013-07-31 MED ORDER — SODIUM CHLORIDE 0.9 % IJ SOLN
10.0000 mL | INTRAMUSCULAR | Status: DC | PRN
  Administered 2013-07-31: 10 mL
  Filled 2013-07-31: qty 10

## 2013-08-03 ENCOUNTER — Ambulatory Visit (HOSPITAL_BASED_OUTPATIENT_CLINIC_OR_DEPARTMENT_OTHER): Payer: Medicare Other | Admitting: Oncology

## 2013-08-03 ENCOUNTER — Telehealth: Payer: Self-pay | Admitting: Oncology

## 2013-08-03 VITALS — BP 130/69 | HR 96 | Temp 97.7°F | Resp 20 | Ht 67.0 in | Wt 167.6 lb

## 2013-08-03 DIAGNOSIS — C3410 Malignant neoplasm of upper lobe, unspecified bronchus or lung: Secondary | ICD-10-CM

## 2013-08-03 DIAGNOSIS — G620 Drug-induced polyneuropathy: Secondary | ICD-10-CM

## 2013-08-03 DIAGNOSIS — C189 Malignant neoplasm of colon, unspecified: Secondary | ICD-10-CM

## 2013-08-03 DIAGNOSIS — C786 Secondary malignant neoplasm of retroperitoneum and peritoneum: Secondary | ICD-10-CM

## 2013-08-03 DIAGNOSIS — C185 Malignant neoplasm of splenic flexure: Secondary | ICD-10-CM

## 2013-08-03 DIAGNOSIS — C3490 Malignant neoplasm of unspecified part of unspecified bronchus or lung: Secondary | ICD-10-CM

## 2013-08-03 NOTE — Progress Notes (Signed)
Hematology and Oncology Follow Up Visit  CURLY MACKOWSKI [id number] [birth year] 72 y.o. 08/03/2013 6:49 PM   Principle Diagnosis: Encounter Diagnoses  Name Primary?  . Colon adenocarcinoma Yes  . NEOPLASM, MALIGNANT, LUNG, NON-SMALL CELL   . Colon cancer metastasized to multiple sites   . 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 He developed a rise in CEA tumor marker and 2 intra-abdominal soft tissue densities on followup CT scan done in November 2012 while on adjuvant Xeloda chemotherapy. Oxaliplatin and Avastin were added to his regimen. Oxaliplatinum was started 11/26/2011. He had a steady fall in his CEA tumor marker from peak value of 12.6 on 11/02/2011 to 2.3 by July 10. Unfortunately, he developed cumulative neurotoxicity from the Oxaliplatinum. We were following his symptoms closely but he had a sudden worsening of neuropathy, hands greater than feet, and was having trouble buttoning buttons and uncomfortable dysesthesias. I had to stop the Oxaliplatinum. He continued on Xeloda one week on 1 week off and every two-week Avastin until April 2014 when CEA began to slowly rise again to a peak of 17.7 by May 7. Followup CT scans done April 16 showed a new, indeterminate nodule at the left lung base 1.8 cm. There was an increase in the size and number of multiple peritoneal nodules largest up to 1.5 cm and a 2.1 cm soft tissue implant along the surface of the gastric antrum. I reviewed his chemotherapy plan. He was not exposed to Irinotecan. Beginning on May 7, I started him on the FOLFIRI regimen. I had to make a 25% dose reduction after the first treatment due to significant mucositis. He is tolerating treatment much better. He is using biotene oral rinse.  He is not a total of  7 treatments. He was restaged in anticipation of today's visit. I am pleased to report  We are seeing a significant objective response. There has been resolution of previous left lower lobe pulmonary nodule, and significant reduction in all of the peritoneal nodules. This correlates well with a fall in his CEA tumor marker from peak value of 17.7 on May 7 to  most recent value of 5.7 on August 4. Some of this has come at the expense of chemotherapy related toxicity. He is down  almost 10 pounds in weight compared with his weight in June.   Medications: reviewed  Allergies: No Known Allergies  Review of Systems: Constitutional:   He has significant fatigue for at least 4 days after he gets a chemotherapy treatment Respiratory: No cough or dyspnea Cardiovascular:  No chest pain or palpitations Gastrointestinal: No abdominal pain or change in bowel habit Genito-Urinary: No urinary tract symptoms  Musculoskeletal: No muscle bone or joint pain Neurologic: No focal weakness, no headache or change in vision Skin: No rash or ecchymosis. Transient mouth soreness from the chemotherapy but no ulcerations. Remaining ROS negative.  Physical Exam: Blood pressure 130/69, pulse 96, temperature 97.7 F (36.5 C), temperature source Oral, resp. rate 20, height 5\' 7"  (1.702 m), weight 167 lb 9.6 oz (76.023 kg). Wt Readings from Last 3 Encounters:  08/03/13 167 lb 9.6 oz (76.023 kg)  06/22/13 176 lb 6.4 oz (80.015 kg)  05/13/13 181 lb 11.2 oz (82.419 kg)     General appearance: Thin Caucasian man HENNT: Pharynx no erythema  exudate or ulcer Lymph nodes: No adenopathy Breasts: Lungs: Clear to auscultation resonant to percussion Heart: Regular rhythm no murmur Abdomen: Soft, nontender, no mass, no organomegaly Extremities: No edema, no calf tenderness Musculoskeletal: No joint deformities GU: Vascular: No carotid bruits, no cyanosis Neurologic: Chronic visual problems with bilateral corneal implants.  Motor strength 5 over 5, reflexes 1+ symmetric, sensation intact to vibration over the fingertips Skin: No rash or ecchymosis  Lab Results: Lab Results  Component Value Date   WBC 6.6 07/27/2013   HGB 15.1 07/27/2013   HCT 45.6 07/27/2013   MCV 87.4 07/27/2013   PLT 183 07/27/2013     Chemistry      Component Value Date/Time   NA 137 07/27/2013 1556   NA 138 04/27/2013 1300   NA 139 10/15/2011 1601   K 4.8 07/27/2013 1556   K 4.2 04/27/2013 1300   K 4.3 10/15/2011 1601   CL 108* 06/17/2013 1200   CL 104 04/27/2013 1300   CL 101 10/15/2011 1601   CO2 28 07/27/2013 1556   CO2 24 04/27/2013 1300   CO2 30 10/15/2011 1601   BUN 13.2 07/27/2013 1556   BUN 13 04/27/2013 1300   BUN 17 10/15/2011 1601   CREATININE 1.3 07/27/2013 1556   CREATININE 1.15 04/27/2013 1300   CREATININE 1.1 10/15/2011 1601      Component Value Date/Time   CALCIUM 10.6* 07/27/2013 1556   CALCIUM 9.9 04/27/2013 1300   CALCIUM 9.3 10/15/2011 1601   ALKPHOS 66 07/27/2013 1556   ALKPHOS 50 12/31/2012 1632   ALKPHOS 59 10/15/2011 1601   AST 15 07/27/2013 1556   AST 19 12/31/2012 1632   AST 35 10/15/2011 1601   ALT 12 07/27/2013 1556   ALT 14 12/31/2012 1632   ALT 37 10/15/2011 1601   BILITOT 1.18 07/27/2013 1556   BILITOT 0.6 12/31/2012 1632   BILITOT 1.30 10/15/2011 1601       Radiological Studies: Ct Chest W Contrast  07/27/2013   *RADIOLOGY REPORT*  Clinical Data:  Metastatic colon cancer.  Non small cell lung cancer.  Restaging.  CT CHEST, ABDOMEN AND PELVIS WITH CONTRAST  Technique:  Multidetector CT imaging of the chest, abdomen and pelvis was performed following the standard protocol during bolus administration of intravenous contrast.  Contrast: OMNIPAQUE IOHEXOL 300 MG/ML  SOLN  Comparison:  Prior examinations 04/08/2013.  CT CHEST  Findings:  Right IJ central venous catheter extends to the SVC right atrial junction.  Small precarinal, subcarinal and hilar lymph nodes are unchanged.  There are no pathologically enlarged lymph nodes.   There is stable atherosclerosis of the aorta, great vessels and coronary arteries.  There is no pleural or pericardial effusion.  The brachytherapy seeds anteriorly in the left upper lobe and surrounding scarring are stable.  There is stable linear scarring peripherally in the right upper lobe and stable moderate emphysema.  The new nodular density medially in the left lower lobe described on the recent CT has resolved.  No new or enlarging nodules are identified.  There are no worrisome osseous findings.  IMPRESSION:  1.  Interval resolution of previously described left lower lobe pulmonary nodule.  No current evidence of metastatic disease. 2.  Stable emphysema and scattered pulmonary scarring. 3.  No pathologically enlarged lymph nodes.  CT ABDOMEN AND PELVIS  Findings:  There is a 11 mm hypervascular lesion in the posterior segment of the right hepatic lobe (image 54) which is unchanged from the recent studies.  This  is not as well seen on the older studies.  No low density liver lesions are identified.  The spleen, gallbladder, pancreas and adrenal glands appear normal.  There is stable mild renal cortical thinning on the right and an extrarenal right renal pelvis.  The left kidney appears normal.  There is a stable proximal duodenal diverticulum.  The stomach and colon appear normal.  There is stable mild enlargement of the prostate gland.  The bladder appears normal.  There is stable aorto iliac atherosclerosis.  No enlarged abdominal pelvic lymph nodes are seen. The previously noted peritoneal nodularity has improved.  The largest remaining nodule is in the left upper quadrant, measuring 10 mm on image 65 (previously 15 mm).  The remnants of small peritoneal nodules are also noted on images 55, 64 and 84.  No enlarging nodules are identified.  There is a stable lipoma within the right lateral abdominal wall. There are stable degenerative changes in the lumbar spine.  There is a grade 1 anterolisthesis at  L5-S1 secondary to bilateral L5 pars defects.  This results in foraminal narrowing bilaterally and probable bilateral exiting L5 nerve root encroachment.  IMPRESSION:  1.  Interval improvement in peritoneal nodularity consistent with treated metastatic disease. 2.  No findings suspicious for disease progression. 3.  Hypervascular liver lesion appears unchanged from recent prior studies and is probably an incidental hemangioma. 4.  Stable incidental findings described above.   Original Report Authenticated By: Carey Bullocks, M.D.      Impression:  #1. Colon cancer metastatic to peritoneum and likely lung (left lower lobe lesion which appeared between January and April CT scans) He is showing a nice response to FOLFIRI/Avastin with resolution of the left lower lobe lung lesion and regression of all of the peritoneal nodules. Plan: Continue current regimen. I discussed with him that there is really no defined endpoint to treatment. As long as he can tolerate the current program I will continue through October to complete 6 months total therapy and then I will either give him a drug holiday or modify the regimen.  #2. Metachronous primary bilateral non-small cell lung cancers  Initial lesion in the left upper lung treated with surgical resection and brachy therapy in June, 2007with no recurrence. Second tumor in the right lung treated with observation alone with complete regression when Avastin was started for his colon cancer. Recent left lower lobe lung nodule resolved on anti-colon cancer treatment and was likely a pulmonary metastasis from the colon. .  #3. Oxaliplatin related neuropathy  Improving significantly off oxaliplatin.  #4. Type 2 diabetes   #5. History of nephrolithiasis   #6. Status post bilateral corneal transplants.     CC:.    Levert Feinstein, MD 8/11/20146:49 PM

## 2013-08-03 NOTE — Telephone Encounter (Signed)
Gave pt appt for lab and ML and emailed Marcelino Duster for chemo for August and September 2014

## 2013-08-04 ENCOUNTER — Telehealth: Payer: Self-pay | Admitting: Oncology

## 2013-08-04 ENCOUNTER — Telehealth: Payer: Self-pay | Admitting: *Deleted

## 2013-08-04 ENCOUNTER — Other Ambulatory Visit: Payer: Self-pay | Admitting: *Deleted

## 2013-08-04 NOTE — Telephone Encounter (Signed)
Per staff message and POF I have scheduled appts. Advised the scheduler that the lab appts need to be moved.   JMW

## 2013-08-04 NOTE — Telephone Encounter (Signed)
called pt and left message regarding lab, MD and chemo for August and September 2014

## 2013-08-12 ENCOUNTER — Ambulatory Visit (HOSPITAL_BASED_OUTPATIENT_CLINIC_OR_DEPARTMENT_OTHER): Payer: Medicare Other

## 2013-08-12 ENCOUNTER — Other Ambulatory Visit (HOSPITAL_BASED_OUTPATIENT_CLINIC_OR_DEPARTMENT_OTHER): Payer: Medicare Other | Admitting: Lab

## 2013-08-12 ENCOUNTER — Other Ambulatory Visit: Payer: Self-pay | Admitting: Nurse Practitioner

## 2013-08-12 VITALS — BP 126/64 | HR 91 | Temp 98.0°F | Resp 18

## 2013-08-12 DIAGNOSIS — C786 Secondary malignant neoplasm of retroperitoneum and peritoneum: Secondary | ICD-10-CM

## 2013-08-12 DIAGNOSIS — C189 Malignant neoplasm of colon, unspecified: Secondary | ICD-10-CM

## 2013-08-12 DIAGNOSIS — C185 Malignant neoplasm of splenic flexure: Secondary | ICD-10-CM

## 2013-08-12 DIAGNOSIS — Z5112 Encounter for antineoplastic immunotherapy: Secondary | ICD-10-CM

## 2013-08-12 DIAGNOSIS — C3490 Malignant neoplasm of unspecified part of unspecified bronchus or lung: Secondary | ICD-10-CM

## 2013-08-12 DIAGNOSIS — Z5111 Encounter for antineoplastic chemotherapy: Secondary | ICD-10-CM

## 2013-08-12 LAB — CBC WITH DIFFERENTIAL/PLATELET
BASO%: 0.8 % (ref 0.0–2.0)
LYMPH%: 17.1 % (ref 14.0–49.0)
MCHC: 32.3 g/dL (ref 32.0–36.0)
MCV: 88.1 fL (ref 79.3–98.0)
MONO#: 0.5 10*3/uL (ref 0.1–0.9)
MONO%: 7.7 % (ref 0.0–14.0)
Platelets: 140 10*3/uL (ref 140–400)
RBC: 5.06 10*6/uL (ref 4.20–5.82)
RDW: 18.5 % — ABNORMAL HIGH (ref 11.0–14.6)
WBC: 6.7 10*3/uL (ref 4.0–10.3)
nRBC: 0 % (ref 0–0)

## 2013-08-12 MED ORDER — SODIUM CHLORIDE 0.9 % IV SOLN
5.0000 mg/kg | Freq: Once | INTRAVENOUS | Status: AC
  Administered 2013-08-12: 400 mg via INTRAVENOUS
  Filled 2013-08-12: qty 16

## 2013-08-12 MED ORDER — DEXTROSE 5 % IV SOLN
135.0000 mg/m2 | Freq: Once | INTRAVENOUS | Status: AC
  Administered 2013-08-12: 266 mg via INTRAVENOUS
  Filled 2013-08-12: qty 13.3

## 2013-08-12 MED ORDER — ONDANSETRON 16 MG/50ML IVPB (CHCC)
16.0000 mg | Freq: Once | INTRAVENOUS | Status: AC
  Administered 2013-08-12: 16 mg via INTRAVENOUS

## 2013-08-12 MED ORDER — LEUCOVORIN CALCIUM INJECTION 350 MG
305.0000 mg/m2 | Freq: Once | INTRAVENOUS | Status: AC
  Administered 2013-08-12: 600 mg via INTRAVENOUS
  Filled 2013-08-12: qty 30

## 2013-08-12 MED ORDER — SODIUM CHLORIDE 0.9 % IV SOLN
Freq: Once | INTRAVENOUS | Status: AC
  Administered 2013-08-12: 14:00:00 via INTRAVENOUS

## 2013-08-12 MED ORDER — FLUOROURACIL CHEMO INJECTION 2.5 GM/50ML
300.0000 mg/m2 | Freq: Once | INTRAVENOUS | Status: AC
  Administered 2013-08-12: 600 mg via INTRAVENOUS
  Filled 2013-08-12: qty 12

## 2013-08-12 MED ORDER — SODIUM CHLORIDE 0.9 % IV SOLN
1800.0000 mg/m2 | INTRAVENOUS | Status: DC
  Administered 2013-08-12: 3550 mg via INTRAVENOUS
  Filled 2013-08-12: qty 71

## 2013-08-12 MED ORDER — LORAZEPAM 0.5 MG PO TABS: ORAL_TABLET | ORAL | Status: DC

## 2013-08-12 MED ORDER — ATROPINE SULFATE 1 MG/ML IJ SOLN
0.5000 mg | Freq: Once | INTRAMUSCULAR | Status: AC | PRN
  Administered 2013-08-12: 0.5 mg via INTRAVENOUS

## 2013-08-12 MED ORDER — DEXAMETHASONE SODIUM PHOSPHATE 20 MG/5ML IJ SOLN
20.0000 mg | Freq: Once | INTRAMUSCULAR | Status: AC
  Administered 2013-08-12: 20 mg via INTRAVENOUS

## 2013-08-12 NOTE — Patient Instructions (Addendum)
[facility] Discharge Instructions for Patients Receiving Chemotherapy  Today you received the following chemotherapy agents: Avastin, Irinotecan, Leucovorin, 5-FU (push and pump)   To help prevent nausea and vomiting after your treatment, we encourage you to take your nausea medication as directed by your physician   If you develop nausea and vomiting that is not controlled by your nausea medication, call the clinic.   BELOW ARE SYMPTOMS THAT SHOULD BE REPORTED IMMEDIATELY:  *FEVER GREATER THAN 100.5 F  *CHILLS WITH OR WITHOUT FEVER  NAUSEA AND VOMITING THAT IS NOT CONTROLLED WITH YOUR NAUSEA MEDICATION  *UNUSUAL SHORTNESS OF BREATH  *UNUSUAL BRUISING OR BLEEDING  TENDERNESS IN MOUTH AND THROAT WITH OR WITHOUT PRESENCE OF ULCERS  *URINARY PROBLEMS  *BOWEL PROBLEMS  UNUSUAL RASH Items with * indicate a potential emergency and should be followed up as soon as possible.  Feel free to call the clinic you have any questions or concerns. The clinic phone number is [phone].

## 2013-08-14 ENCOUNTER — Ambulatory Visit (HOSPITAL_BASED_OUTPATIENT_CLINIC_OR_DEPARTMENT_OTHER): Payer: Medicare Other

## 2013-08-14 VITALS — BP 131/63 | HR 79 | Temp 97.6°F | Resp 20

## 2013-08-14 DIAGNOSIS — C189 Malignant neoplasm of colon, unspecified: Secondary | ICD-10-CM

## 2013-08-14 DIAGNOSIS — Z452 Encounter for adjustment and management of vascular access device: Secondary | ICD-10-CM

## 2013-08-14 MED ORDER — SODIUM CHLORIDE 0.9 % IJ SOLN
10.0000 mL | INTRAMUSCULAR | Status: DC | PRN
  Administered 2013-08-14: 10 mL
  Filled 2013-08-14: qty 10

## 2013-08-14 MED ORDER — HEPARIN SOD (PORK) LOCK FLUSH 100 UNIT/ML IV SOLN
500.0000 [IU] | Freq: Once | INTRAVENOUS | Status: AC | PRN
  Administered 2013-08-14: 500 [IU]
  Filled 2013-08-14: qty 5

## 2013-08-26 ENCOUNTER — Ambulatory Visit (HOSPITAL_BASED_OUTPATIENT_CLINIC_OR_DEPARTMENT_OTHER): Payer: Medicare Other

## 2013-08-26 ENCOUNTER — Other Ambulatory Visit (HOSPITAL_BASED_OUTPATIENT_CLINIC_OR_DEPARTMENT_OTHER): Payer: Medicare Other | Admitting: Lab

## 2013-08-26 VITALS — BP 125/60 | HR 86 | Temp 97.9°F

## 2013-08-26 DIAGNOSIS — C185 Malignant neoplasm of splenic flexure: Secondary | ICD-10-CM

## 2013-08-26 DIAGNOSIS — C189 Malignant neoplasm of colon, unspecified: Secondary | ICD-10-CM

## 2013-08-26 DIAGNOSIS — C786 Secondary malignant neoplasm of retroperitoneum and peritoneum: Secondary | ICD-10-CM

## 2013-08-26 DIAGNOSIS — C3490 Malignant neoplasm of unspecified part of unspecified bronchus or lung: Secondary | ICD-10-CM

## 2013-08-26 DIAGNOSIS — Z5111 Encounter for antineoplastic chemotherapy: Secondary | ICD-10-CM

## 2013-08-26 DIAGNOSIS — Z5112 Encounter for antineoplastic immunotherapy: Secondary | ICD-10-CM

## 2013-08-26 LAB — CBC WITH DIFFERENTIAL/PLATELET
BASO%: 1.4 % (ref 0.0–2.0)
EOS%: 5.5 % (ref 0.0–7.0)
MCH: 28.8 pg (ref 27.2–33.4)
MCHC: 33.2 g/dL (ref 32.0–36.0)
RDW: 19.2 % — ABNORMAL HIGH (ref 11.0–14.6)
lymph#: 0.8 10*3/uL — ABNORMAL LOW (ref 0.9–3.3)

## 2013-08-26 LAB — COMPREHENSIVE METABOLIC PANEL (CC13)
ALT: 13 U/L (ref 0–55)
AST: 15 U/L (ref 5–34)
Albumin: 3.1 g/dL — ABNORMAL LOW (ref 3.5–5.0)
Calcium: 9.7 mg/dL (ref 8.4–10.4)
Chloride: 110 mEq/L — ABNORMAL HIGH (ref 98–109)
Creatinine: 1.1 mg/dL (ref 0.7–1.3)
Potassium: 4.4 mEq/L (ref 3.5–5.1)
Sodium: 142 mEq/L (ref 136–145)

## 2013-08-26 MED ORDER — LEUCOVORIN CALCIUM INJECTION 350 MG
305.0000 mg/m2 | Freq: Once | INTRAVENOUS | Status: AC
  Administered 2013-08-26: 600 mg via INTRAVENOUS
  Filled 2013-08-26: qty 30

## 2013-08-26 MED ORDER — DEXAMETHASONE SODIUM PHOSPHATE 20 MG/5ML IJ SOLN
20.0000 mg | Freq: Once | INTRAMUSCULAR | Status: AC
  Administered 2013-08-26: 20 mg via INTRAVENOUS

## 2013-08-26 MED ORDER — SODIUM CHLORIDE 0.9 % IV SOLN
5.0000 mg/kg | Freq: Once | INTRAVENOUS | Status: AC
  Administered 2013-08-26: 400 mg via INTRAVENOUS
  Filled 2013-08-26: qty 16

## 2013-08-26 MED ORDER — IRINOTECAN HCL CHEMO INJECTION 100 MG/5ML
135.0000 mg/m2 | Freq: Once | INTRAVENOUS | Status: AC
  Administered 2013-08-26: 266 mg via INTRAVENOUS
  Filled 2013-08-26: qty 13.3

## 2013-08-26 MED ORDER — ONDANSETRON 16 MG/50ML IVPB (CHCC)
16.0000 mg | Freq: Once | INTRAVENOUS | Status: AC
  Administered 2013-08-26: 16 mg via INTRAVENOUS

## 2013-08-26 MED ORDER — FLUOROURACIL CHEMO INJECTION 2.5 GM/50ML
300.0000 mg/m2 | Freq: Once | INTRAVENOUS | Status: AC
  Administered 2013-08-26: 600 mg via INTRAVENOUS
  Filled 2013-08-26: qty 12

## 2013-08-26 MED ORDER — ATROPINE SULFATE 1 MG/ML IJ SOLN
0.5000 mg | Freq: Once | INTRAMUSCULAR | Status: AC | PRN
  Administered 2013-08-26: 0.5 mg via INTRAVENOUS

## 2013-08-26 MED ORDER — SODIUM CHLORIDE 0.9 % IV SOLN
1800.0000 mg/m2 | INTRAVENOUS | Status: DC
  Administered 2013-08-26: 3550 mg via INTRAVENOUS
  Filled 2013-08-26: qty 71

## 2013-08-26 MED ORDER — SODIUM CHLORIDE 0.9 % IV SOLN
Freq: Once | INTRAVENOUS | Status: AC
  Administered 2013-08-26: 13:00:00 via INTRAVENOUS

## 2013-08-26 NOTE — Patient Instructions (Addendum)
[facility] Discharge Instructions for Patients Receiving Chemotherapy  Today you received the following chemotherapy agents Avastin/Leucovorin/Irinotecan/5FU  To help prevent nausea and vomiting after your treatment, we encourage you to take your nausea medication as prescribed.   If you develop nausea and vomiting that is not controlled by your nausea medication, call the clinic.   BELOW ARE SYMPTOMS THAT SHOULD BE REPORTED IMMEDIATELY:  *FEVER GREATER THAN 100.5 F  *CHILLS WITH OR WITHOUT FEVER  NAUSEA AND VOMITING THAT IS NOT CONTROLLED WITH YOUR NAUSEA MEDICATION  *UNUSUAL SHORTNESS OF BREATH  *UNUSUAL BRUISING OR BLEEDING  TENDERNESS IN MOUTH AND THROAT WITH OR WITHOUT PRESENCE OF ULCERS  *URINARY PROBLEMS  *BOWEL PROBLEMS  UNUSUAL RASH Items with * indicate a potential emergency and should be followed up as soon as possible.  Feel free to call the clinic you have any questions or concerns. The clinic phone number is [phone].    

## 2013-08-28 ENCOUNTER — Ambulatory Visit (HOSPITAL_BASED_OUTPATIENT_CLINIC_OR_DEPARTMENT_OTHER): Payer: Medicare Other

## 2013-08-28 VITALS — BP 129/66 | HR 91 | Temp 97.4°F | Resp 20

## 2013-08-28 DIAGNOSIS — C189 Malignant neoplasm of colon, unspecified: Secondary | ICD-10-CM

## 2013-08-28 DIAGNOSIS — Z452 Encounter for adjustment and management of vascular access device: Secondary | ICD-10-CM

## 2013-08-28 DIAGNOSIS — C185 Malignant neoplasm of splenic flexure: Secondary | ICD-10-CM

## 2013-08-28 DIAGNOSIS — C3410 Malignant neoplasm of upper lobe, unspecified bronchus or lung: Secondary | ICD-10-CM

## 2013-08-28 DIAGNOSIS — C786 Secondary malignant neoplasm of retroperitoneum and peritoneum: Secondary | ICD-10-CM

## 2013-08-28 MED ORDER — HEPARIN SOD (PORK) LOCK FLUSH 100 UNIT/ML IV SOLN
500.0000 [IU] | Freq: Once | INTRAVENOUS | Status: AC | PRN
  Administered 2013-08-28: 500 [IU]
  Filled 2013-08-28: qty 5

## 2013-08-28 MED ORDER — SODIUM CHLORIDE 0.9 % IJ SOLN
10.0000 mL | INTRAMUSCULAR | Status: DC | PRN
  Administered 2013-08-28: 10 mL
  Filled 2013-08-28: qty 10

## 2013-08-31 ENCOUNTER — Other Ambulatory Visit: Payer: Medicare Other | Admitting: Lab

## 2013-08-31 ENCOUNTER — Ambulatory Visit (HOSPITAL_BASED_OUTPATIENT_CLINIC_OR_DEPARTMENT_OTHER): Payer: Medicare Other | Admitting: Nurse Practitioner

## 2013-08-31 ENCOUNTER — Telehealth: Payer: Self-pay | Admitting: Oncology

## 2013-08-31 VITALS — BP 135/71 | HR 85 | Temp 97.5°F | Resp 20 | Ht 67.0 in | Wt 167.5 lb

## 2013-08-31 DIAGNOSIS — R5381 Other malaise: Secondary | ICD-10-CM

## 2013-08-31 DIAGNOSIS — C185 Malignant neoplasm of splenic flexure: Secondary | ICD-10-CM

## 2013-08-31 DIAGNOSIS — C189 Malignant neoplasm of colon, unspecified: Secondary | ICD-10-CM

## 2013-08-31 DIAGNOSIS — C786 Secondary malignant neoplasm of retroperitoneum and peritoneum: Secondary | ICD-10-CM

## 2013-08-31 DIAGNOSIS — C3410 Malignant neoplasm of upper lobe, unspecified bronchus or lung: Secondary | ICD-10-CM

## 2013-08-31 DIAGNOSIS — R110 Nausea: Secondary | ICD-10-CM

## 2013-08-31 DIAGNOSIS — G620 Drug-induced polyneuropathy: Secondary | ICD-10-CM

## 2013-08-31 NOTE — Telephone Encounter (Signed)
gv and pritned appt sched and avs for pt for Sept and OCT...emailed MW to add tx.

## 2013-08-31 NOTE — Progress Notes (Signed)
OFFICE PROGRESS NOTE  Interval history:   [name] is a 72 year old man with local intra-abdominal recurrence of colon cancer. He was initially diagnosed with stage III colon cancer in July 2012 status post left colectomy 07/09/2011. He developed a rise in the CEA tumor marker and 2 intra-abdominal soft tissue densities on followup CT scan in November 2012 while on adjuvant Xeloda chemotherapy. Oxaliplatin and Avastin were added to the regimen with a steady fall in the CEA. He developed cumulative neurotoxicity related to the oxaliplatin necessitating discontinuation. Xeloda was continued on a 1 week on/1 week off schedule and Avastin was continued every 2 weeks. There was a recent progressive rise in the CEA. Restaging CT evaluation 04/09/2013 showed increase in size of previously noted peritoneal implants. He underwent placement of a Port-A-Cath on 04/27/2013 and began treatment on the FOLFIRI regimen on 04/29/2013. Avastin was added beginning with cycle 3. Restaging CT evaluation on 07/27/2013 showed interval resolution of previously described left lower lobe nodule. There was interval improvement in peritoneal nodularity. He completed cycle 7 on 07/29/2013, cycle 8 on 08/12/2013 and cycle 9 on 08/26/2013. Most recent CEA on 08/26/2013 was in normal range at 4.2.  He is seen today for scheduled followup. He notes progressive fatigue. He is having mild intermittent nausea. Diarrhea is controlled with Imodium. Appetite is good. Weight is stable. He has consistently noted "aching" along the lower gumline for several days following each chemotherapy. No other pain.   Objective: Blood pressure 135/71, pulse 85, temperature 97.5 F (36.4 C), temperature source Oral, resp. rate 20, height 5\' 7"  (1.702 m), weight 167 lb 8 oz (75.978 kg).  Oropharynx is without thrush or ulceration. No palpable cervical, supraclavicular or axillary lymph nodes. Lungs are clear. No wheezes or rales. Regular cardiac rhythm.  Port-A-Cath site is without erythema. Abdomen is soft and nontender. No hepatomegaly. Extremities are without edema. Motor strength 5 over 5. Palms are without erythema.  Lab Results: Lab Results  Component Value Date   WBC 5.8 08/26/2013   HGB 14.4 08/26/2013   HCT 43.2 08/26/2013   MCV 86.6 08/26/2013   PLT 147 08/26/2013    Chemistry:    Chemistry      Component Value Date/Time   NA 142 08/26/2013 1236   NA 138 04/27/2013 1300   NA 139 10/15/2011 1601   K 4.4 08/26/2013 1236   K 4.2 04/27/2013 1300   K 4.3 10/15/2011 1601   CL 108* 06/17/2013 1200   CL 104 04/27/2013 1300   CL 101 10/15/2011 1601   CO2 24 08/26/2013 1236   CO2 24 04/27/2013 1300   CO2 30 10/15/2011 1601   BUN 11.0 08/26/2013 1236   BUN 13 04/27/2013 1300   BUN 17 10/15/2011 1601   CREATININE 1.1 08/26/2013 1236   CREATININE 1.15 04/27/2013 1300   CREATININE 1.1 10/15/2011 1601      Component Value Date/Time   CALCIUM 9.7 08/26/2013 1236   CALCIUM 9.9 04/27/2013 1300   CALCIUM 9.3 10/15/2011 1601   ALKPHOS 61 08/26/2013 1236   ALKPHOS 50 12/31/2012 1632   ALKPHOS 59 10/15/2011 1601   AST 15 08/26/2013 1236   AST 19 12/31/2012 1632   AST 35 10/15/2011 1601   ALT 13 08/26/2013 1236   ALT 14 12/31/2012 1632   ALT 37 10/15/2011 1601   BILITOT 0.62 08/26/2013 1236   BILITOT 0.6 12/31/2012 1632   BILITOT 1.30 10/15/2011 1601       Studies/Results: No results found.  Medications: I  have reviewed the patient's current medications.  Assessment/Plan:  1. 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 He began treatment on the FOLFIRI regimen on 04/29/2013. Avastin was added beginning with cycle 3. Restaging CT evaluation after cycle 6 showed improvement. 2. Status post Port-A-Cath placement 04/27/2013. 3. Status post resection of a stage I non-small cell lung cancer left upper lung with postop brachytherapy June 2007. 4. Vague, alveolar density right lower lung felt to be a second primary bronchial alveolar lung cancer found at the same time his initial  cancer in the left upper lung. Radiographic complete regression on Avastin based chemotherapy. 5. Oxaliplatin neuropathy. Stable. 6. Type 2 diabetes. 7. History of nephrolithiasis. 8. Status post bilateral corneal transplants.  Disposition-[name] appears stable. He has now completed 9 cycles of FOLFIRI/Avastin. Most recent CEA was in normal range. He will return for cycle 10 on 09/09/2013 and cycle 11 on 09/23/2013. We scheduled a followup visit with Dr. Cyndie Chime on 09/28/2013. He will contact the office in the interim with any problems.  Lonna Cobb ANP/GNP-BC

## 2013-09-01 ENCOUNTER — Telehealth: Payer: Self-pay | Admitting: *Deleted

## 2013-09-01 ENCOUNTER — Telehealth: Payer: Self-pay | Admitting: Oncology

## 2013-09-01 NOTE — Telephone Encounter (Signed)
Per staff message and POF I have scheduled appts. Advised scheduler that labs need to be adjusted   JMW

## 2013-09-01 NOTE — Telephone Encounter (Signed)
lvm for pt regarding to time change of all appt exept MD visit

## 2013-09-09 ENCOUNTER — Other Ambulatory Visit: Payer: Medicare Other | Admitting: Lab

## 2013-09-09 ENCOUNTER — Ambulatory Visit (HOSPITAL_BASED_OUTPATIENT_CLINIC_OR_DEPARTMENT_OTHER): Payer: Medicare Other

## 2013-09-09 ENCOUNTER — Other Ambulatory Visit: Payer: Self-pay | Admitting: Oncology

## 2013-09-09 ENCOUNTER — Other Ambulatory Visit (HOSPITAL_BASED_OUTPATIENT_CLINIC_OR_DEPARTMENT_OTHER): Payer: Medicare Other | Admitting: Lab

## 2013-09-09 VITALS — BP 132/63 | HR 74 | Temp 97.8°F | Resp 20

## 2013-09-09 DIAGNOSIS — C786 Secondary malignant neoplasm of retroperitoneum and peritoneum: Secondary | ICD-10-CM

## 2013-09-09 DIAGNOSIS — C185 Malignant neoplasm of splenic flexure: Secondary | ICD-10-CM

## 2013-09-09 DIAGNOSIS — C3490 Malignant neoplasm of unspecified part of unspecified bronchus or lung: Secondary | ICD-10-CM

## 2013-09-09 DIAGNOSIS — Z5112 Encounter for antineoplastic immunotherapy: Secondary | ICD-10-CM

## 2013-09-09 DIAGNOSIS — C189 Malignant neoplasm of colon, unspecified: Secondary | ICD-10-CM

## 2013-09-09 DIAGNOSIS — Z5111 Encounter for antineoplastic chemotherapy: Secondary | ICD-10-CM

## 2013-09-09 LAB — CBC WITH DIFFERENTIAL/PLATELET
Basophils Absolute: 0 10*3/uL (ref 0.0–0.1)
Eosinophils Absolute: 0.3 10*3/uL (ref 0.0–0.5)
HCT: 45.4 % (ref 38.4–49.9)
HGB: 15 g/dL (ref 13.0–17.1)
LYMPH%: 14.5 % (ref 14.0–49.0)
MONO#: 0.6 10*3/uL (ref 0.1–0.9)
NEUT#: 4.7 10*3/uL (ref 1.5–6.5)
NEUT%: 70.9 % (ref 39.0–75.0)
Platelets: 133 10*3/uL — ABNORMAL LOW (ref 140–400)
WBC: 6.7 10*3/uL (ref 4.0–10.3)
lymph#: 1 10*3/uL (ref 0.9–3.3)

## 2013-09-09 LAB — COMPREHENSIVE METABOLIC PANEL (CC13)
Albumin: 3.1 g/dL — ABNORMAL LOW (ref 3.5–5.0)
BUN: 12.4 mg/dL (ref 7.0–26.0)
CO2: 24 mEq/L (ref 22–29)
Calcium: 9.3 mg/dL (ref 8.4–10.4)
Chloride: 111 mEq/L — ABNORMAL HIGH (ref 98–109)
Creatinine: 1 mg/dL (ref 0.7–1.3)
Potassium: 4.3 mEq/L (ref 3.5–5.1)

## 2013-09-09 MED ORDER — SODIUM CHLORIDE 0.9 % IV SOLN
5.0000 mg/kg | Freq: Once | INTRAVENOUS | Status: AC
  Administered 2013-09-09: 400 mg via INTRAVENOUS
  Filled 2013-09-09: qty 16

## 2013-09-09 MED ORDER — SODIUM CHLORIDE 0.9 % IV SOLN
Freq: Once | INTRAVENOUS | Status: AC
  Administered 2013-09-09: 13:00:00 via INTRAVENOUS

## 2013-09-09 MED ORDER — ONDANSETRON 16 MG/50ML IVPB (CHCC)
16.0000 mg | Freq: Once | INTRAVENOUS | Status: AC
  Administered 2013-09-09: 16 mg via INTRAVENOUS

## 2013-09-09 MED ORDER — DEXAMETHASONE SODIUM PHOSPHATE 20 MG/5ML IJ SOLN
20.0000 mg | Freq: Once | INTRAMUSCULAR | Status: AC
  Administered 2013-09-09: 20 mg via INTRAVENOUS

## 2013-09-09 MED ORDER — FLUOROURACIL CHEMO INJECTION 5 GM/100ML
1800.0000 mg/m2 | INTRAVENOUS | Status: DC
  Administered 2013-09-09: 3550 mg via INTRAVENOUS
  Filled 2013-09-09: qty 71

## 2013-09-09 MED ORDER — LEUCOVORIN CALCIUM INJECTION 350 MG
300.0000 mg/m2 | Freq: Once | INTRAVENOUS | Status: AC
  Administered 2013-09-09: 592 mg via INTRAVENOUS
  Filled 2013-09-09: qty 29.6

## 2013-09-09 MED ORDER — ATROPINE SULFATE 1 MG/ML IJ SOLN
INTRAMUSCULAR | Status: AC
  Filled 2013-09-09: qty 1

## 2013-09-09 MED ORDER — ONDANSETRON 16 MG/50ML IVPB (CHCC)
INTRAVENOUS | Status: AC
  Filled 2013-09-09: qty 16

## 2013-09-09 MED ORDER — ATROPINE SULFATE 1 MG/ML IJ SOLN
0.5000 mg | Freq: Once | INTRAMUSCULAR | Status: AC | PRN
  Administered 2013-09-09: 0.5 mg via INTRAVENOUS

## 2013-09-09 MED ORDER — DEXAMETHASONE SODIUM PHOSPHATE 20 MG/5ML IJ SOLN
INTRAMUSCULAR | Status: AC
  Filled 2013-09-09: qty 5

## 2013-09-09 MED ORDER — IRINOTECAN HCL CHEMO INJECTION 100 MG/5ML
135.0000 mg/m2 | Freq: Once | INTRAVENOUS | Status: AC
  Administered 2013-09-09: 266 mg via INTRAVENOUS
  Filled 2013-09-09: qty 13.3

## 2013-09-09 MED ORDER — FLUOROURACIL CHEMO INJECTION 2.5 GM/50ML
300.0000 mg/m2 | Freq: Once | INTRAVENOUS | Status: AC
  Administered 2013-09-09: 600 mg via INTRAVENOUS
  Filled 2013-09-09: qty 12

## 2013-09-09 NOTE — Progress Notes (Signed)
Urine protein 30. Pharmacy notified

## 2013-09-09 NOTE — Patient Instructions (Addendum)
[facility] Discharge Instructions for Patients Receiving Chemotherapy  Today you received the following chemotherapy agents FOLFIRI/Avastin  To help prevent nausea and vomiting after your treatment, we encourage you to take your nausea medication as needed   If you develop nausea and vomiting that is not controlled by your nausea medication, call the clinic.   BELOW ARE SYMPTOMS THAT SHOULD BE REPORTED IMMEDIATELY:  *FEVER GREATER THAN 100.5 F  *CHILLS WITH OR WITHOUT FEVER  NAUSEA AND VOMITING THAT IS NOT CONTROLLED WITH YOUR NAUSEA MEDICATION  *UNUSUAL SHORTNESS OF BREATH  *UNUSUAL BRUISING OR BLEEDING  TENDERNESS IN MOUTH AND THROAT WITH OR WITHOUT PRESENCE OF ULCERS  *URINARY PROBLEMS  *BOWEL PROBLEMS  UNUSUAL RASH Items with * indicate a potential emergency and should be followed up as soon as possible.  Feel free to call the clinic you have any questions or concerns. The clinic phone number is [phone].    

## 2013-09-10 ENCOUNTER — Telehealth: Payer: Self-pay | Admitting: *Deleted

## 2013-09-10 LAB — CEA: CEA: 4.6 ng/mL (ref 0.0–5.0)

## 2013-09-10 NOTE — Telephone Encounter (Signed)
Message copied by Sabino Snipes on Thu Sep 10, 2013  1:48 PM ------      Message from: Levert Feinstein      Created: Thu Aug 27, 2013  4:35 PM       Call pt  CEA now back in normal range! ------

## 2013-09-10 NOTE — Telephone Encounter (Signed)
Called pt with results of CEA & informed in normal range.  He had already received this report but was very thankful.

## 2013-09-11 ENCOUNTER — Ambulatory Visit (HOSPITAL_BASED_OUTPATIENT_CLINIC_OR_DEPARTMENT_OTHER): Payer: Medicare Other

## 2013-09-11 VITALS — BP 130/64 | HR 72 | Temp 97.8°F | Resp 18

## 2013-09-11 DIAGNOSIS — C189 Malignant neoplasm of colon, unspecified: Secondary | ICD-10-CM

## 2013-09-11 DIAGNOSIS — C786 Secondary malignant neoplasm of retroperitoneum and peritoneum: Secondary | ICD-10-CM

## 2013-09-11 DIAGNOSIS — C185 Malignant neoplasm of splenic flexure: Secondary | ICD-10-CM

## 2013-09-11 DIAGNOSIS — Z452 Encounter for adjustment and management of vascular access device: Secondary | ICD-10-CM

## 2013-09-11 MED ORDER — SODIUM CHLORIDE 0.9 % IJ SOLN
10.0000 mL | INTRAMUSCULAR | Status: DC | PRN
  Administered 2013-09-11: 10 mL
  Filled 2013-09-11: qty 10

## 2013-09-11 MED ORDER — HEPARIN SOD (PORK) LOCK FLUSH 100 UNIT/ML IV SOLN
500.0000 [IU] | Freq: Once | INTRAVENOUS | Status: AC | PRN
  Administered 2013-09-11: 500 [IU]
  Filled 2013-09-11: qty 5

## 2013-09-11 NOTE — Patient Instructions (Addendum)

## 2013-09-16 ENCOUNTER — Other Ambulatory Visit: Payer: Self-pay | Admitting: Oncology

## 2013-09-16 DIAGNOSIS — C3490 Malignant neoplasm of unspecified part of unspecified bronchus or lung: Secondary | ICD-10-CM

## 2013-09-16 MED ORDER — HYDROCODONE-ACETAMINOPHEN 5-325 MG PO TABS: ORAL_TABLET | ORAL | Status: DC

## 2013-09-16 NOTE — Telephone Encounter (Signed)
OK per Lonna Cobb, NP to refill and increase quantity to #75

## 2013-09-23 ENCOUNTER — Other Ambulatory Visit (HOSPITAL_BASED_OUTPATIENT_CLINIC_OR_DEPARTMENT_OTHER): Payer: Medicare Other | Admitting: Lab

## 2013-09-23 ENCOUNTER — Ambulatory Visit (HOSPITAL_BASED_OUTPATIENT_CLINIC_OR_DEPARTMENT_OTHER): Payer: Medicare Other

## 2013-09-23 ENCOUNTER — Other Ambulatory Visit: Payer: Medicare Other | Admitting: Lab

## 2013-09-23 VITALS — BP 119/72 | HR 84 | Temp 97.5°F

## 2013-09-23 DIAGNOSIS — C185 Malignant neoplasm of splenic flexure: Secondary | ICD-10-CM

## 2013-09-23 DIAGNOSIS — C189 Malignant neoplasm of colon, unspecified: Secondary | ICD-10-CM

## 2013-09-23 DIAGNOSIS — Z5112 Encounter for antineoplastic immunotherapy: Secondary | ICD-10-CM

## 2013-09-23 DIAGNOSIS — C786 Secondary malignant neoplasm of retroperitoneum and peritoneum: Secondary | ICD-10-CM

## 2013-09-23 DIAGNOSIS — Z5111 Encounter for antineoplastic chemotherapy: Secondary | ICD-10-CM

## 2013-09-23 LAB — COMPREHENSIVE METABOLIC PANEL (CC13)
ALT: 14 U/L (ref 0–55)
AST: 14 U/L (ref 5–34)
Albumin: 3.2 g/dL — ABNORMAL LOW (ref 3.5–5.0)
Alkaline Phosphatase: 54 U/L (ref 40–150)
BUN: 15 mg/dL (ref 7.0–26.0)
Calcium: 10 mg/dL (ref 8.4–10.4)
Chloride: 109 mEq/L (ref 98–109)
Glucose: 149 mg/dl — ABNORMAL HIGH (ref 70–140)
Potassium: 4.5 mEq/L (ref 3.5–5.1)
Sodium: 141 mEq/L (ref 136–145)
Total Protein: 6.1 g/dL — ABNORMAL LOW (ref 6.4–8.3)

## 2013-09-23 LAB — CBC WITH DIFFERENTIAL/PLATELET
BASO%: 0.3 % (ref 0.0–2.0)
Basophils Absolute: 0 10*3/uL (ref 0.0–0.1)
Eosinophils Absolute: 0.3 10*3/uL (ref 0.0–0.5)
HCT: 47.6 % (ref 38.4–49.9)
HGB: 15.7 g/dL (ref 13.0–17.1)
LYMPH%: 18.8 % (ref 14.0–49.0)
MCHC: 33 g/dL (ref 32.0–36.0)
MONO#: 0.6 10*3/uL (ref 0.1–0.9)
NEUT#: 4.1 10*3/uL (ref 1.5–6.5)
NEUT%: 67 % (ref 39.0–75.0)
Platelets: 113 10*3/uL — ABNORMAL LOW (ref 140–400)
WBC: 6.1 10*3/uL (ref 4.0–10.3)
lymph#: 1.1 10*3/uL (ref 0.9–3.3)

## 2013-09-23 LAB — POCT URINE QUALITATIVE DIPSTICK PROTEIN: Protein, UA: 30

## 2013-09-23 MED ORDER — DEXAMETHASONE SODIUM PHOSPHATE 20 MG/5ML IJ SOLN
INTRAMUSCULAR | Status: AC
  Filled 2013-09-23: qty 5

## 2013-09-23 MED ORDER — SODIUM CHLORIDE 0.9 % IV SOLN
Freq: Once | INTRAVENOUS | Status: AC
  Administered 2013-09-23: 13:00:00 via INTRAVENOUS

## 2013-09-23 MED ORDER — ONDANSETRON 16 MG/50ML IVPB (CHCC)
INTRAVENOUS | Status: AC
  Filled 2013-09-23: qty 16

## 2013-09-23 MED ORDER — ATROPINE SULFATE 1 MG/ML IJ SOLN
INTRAMUSCULAR | Status: AC
  Filled 2013-09-23: qty 1

## 2013-09-23 MED ORDER — LEUCOVORIN CALCIUM INJECTION 350 MG
305.0000 mg/m2 | Freq: Once | INTRAVENOUS | Status: AC
  Administered 2013-09-23: 600 mg via INTRAVENOUS
  Filled 2013-09-23: qty 30

## 2013-09-23 MED ORDER — ONDANSETRON 16 MG/50ML IVPB (CHCC)
16.0000 mg | Freq: Once | INTRAVENOUS | Status: AC
  Administered 2013-09-23: 16 mg via INTRAVENOUS

## 2013-09-23 MED ORDER — ATROPINE SULFATE 1 MG/ML IJ SOLN
0.5000 mg | Freq: Once | INTRAMUSCULAR | Status: AC | PRN
  Administered 2013-09-23: 0.5 mg via INTRAVENOUS

## 2013-09-23 MED ORDER — SODIUM CHLORIDE 0.9 % IV SOLN
5.0000 mg/kg | Freq: Once | INTRAVENOUS | Status: AC
  Administered 2013-09-23: 400 mg via INTRAVENOUS
  Filled 2013-09-23: qty 16

## 2013-09-23 MED ORDER — FLUOROURACIL CHEMO INJECTION 2.5 GM/50ML
300.0000 mg/m2 | Freq: Once | INTRAVENOUS | Status: AC
  Administered 2013-09-23: 600 mg via INTRAVENOUS
  Filled 2013-09-23: qty 12

## 2013-09-23 MED ORDER — DEXAMETHASONE SODIUM PHOSPHATE 20 MG/5ML IJ SOLN
20.0000 mg | Freq: Once | INTRAMUSCULAR | Status: AC
  Administered 2013-09-23: 20 mg via INTRAVENOUS

## 2013-09-23 MED ORDER — SODIUM CHLORIDE 0.9 % IV SOLN
1800.0000 mg/m2 | INTRAVENOUS | Status: DC
  Administered 2013-09-23: 3550 mg via INTRAVENOUS
  Filled 2013-09-23: qty 71

## 2013-09-23 MED ORDER — IRINOTECAN HCL CHEMO INJECTION 100 MG/5ML
135.0000 mg/m2 | Freq: Once | INTRAVENOUS | Status: AC
  Administered 2013-09-23: 266 mg via INTRAVENOUS
  Filled 2013-09-23: qty 13.3

## 2013-09-23 NOTE — Patient Instructions (Addendum)
[facility] Discharge Instructions for Patients Receiving Chemotherapy  Today you received the following chemotherapy agents avastin, camptosar, leucovorin, 4fu and a pump with 64fu.  To help prevent nausea and vomiting after your treatment, we encourage you to take your nausea medication zofran.   If you develop nausea and vomiting that is not controlled by your nausea medication, call the clinic.   BELOW ARE SYMPTOMS THAT SHOULD BE REPORTED IMMEDIATELY:  *FEVER GREATER THAN 100.5 F  *CHILLS WITH OR WITHOUT FEVER  NAUSEA AND VOMITING THAT IS NOT CONTROLLED WITH YOUR NAUSEA MEDICATION  *UNUSUAL SHORTNESS OF BREATH  *UNUSUAL BRUISING OR BLEEDING  TENDERNESS IN MOUTH AND THROAT WITH OR WITHOUT PRESENCE OF ULCERS  *URINARY PROBLEMS  *BOWEL PROBLEMS  UNUSUAL RASH Items with * indicate a potential emergency and should be followed up as soon as possible.  Feel free to call the clinic you have any questions or concerns. The clinic phone number is [phone].

## 2013-09-24 LAB — CEA: CEA: 4.7 ng/mL (ref 0.0–5.0)

## 2013-09-25 ENCOUNTER — Ambulatory Visit (HOSPITAL_BASED_OUTPATIENT_CLINIC_OR_DEPARTMENT_OTHER): Payer: Medicare Other

## 2013-09-25 ENCOUNTER — Telehealth: Payer: Self-pay | Admitting: Oncology

## 2013-09-25 VITALS — BP 136/66 | HR 80 | Temp 97.6°F

## 2013-09-25 DIAGNOSIS — C189 Malignant neoplasm of colon, unspecified: Secondary | ICD-10-CM

## 2013-09-25 DIAGNOSIS — Z452 Encounter for adjustment and management of vascular access device: Secondary | ICD-10-CM

## 2013-09-25 DIAGNOSIS — C185 Malignant neoplasm of splenic flexure: Secondary | ICD-10-CM

## 2013-09-25 MED ORDER — HEPARIN SOD (PORK) LOCK FLUSH 100 UNIT/ML IV SOLN
500.0000 [IU] | Freq: Once | INTRAVENOUS | Status: AC | PRN
  Administered 2013-09-25: 500 [IU]
  Filled 2013-09-25: qty 5

## 2013-09-25 MED ORDER — SODIUM CHLORIDE 0.9 % IJ SOLN
10.0000 mL | INTRAMUSCULAR | Status: DC | PRN
  Administered 2013-09-25: 10 mL
  Filled 2013-09-25: qty 10

## 2013-09-25 NOTE — Telephone Encounter (Signed)
Faxed pt medical records Dr. Marcello Moores [facility] [employer] [facility]

## 2013-09-28 ENCOUNTER — Ambulatory Visit: Payer: Medicare Other | Admitting: Oncology

## 2013-10-01 ENCOUNTER — Telehealth: Payer: Self-pay | Admitting: Oncology

## 2013-10-01 ENCOUNTER — Telehealth: Payer: Self-pay | Admitting: *Deleted

## 2013-10-01 NOTE — Telephone Encounter (Signed)
Per staff message and POF I have scheduled appts.  JMW  

## 2013-10-01 NOTE — Telephone Encounter (Signed)
Gave pt appt for lab,md and chemo for October 2014

## 2013-10-05 ENCOUNTER — Telehealth: Payer: Self-pay | Admitting: *Deleted

## 2013-10-05 ENCOUNTER — Other Ambulatory Visit: Payer: Self-pay | Admitting: *Deleted

## 2013-10-05 MED ORDER — AMOXICILLIN-POT CLAVULANATE 875-125 MG PO TABS: 1.0000 | ORAL_TABLET | Freq: Two times a day (BID) | ORAL | Status: DC

## 2013-10-05 NOTE — Telephone Encounter (Signed)
Received message from pt asking "I have a dentist appt tomorrow for cleaning and scaling; should I start an antibiotic today before procedure tomorrow?"  Note to MD.

## 2013-10-05 NOTE — Telephone Encounter (Signed)
Notified pt that MD ordered antibiotic (Augmentin) to start today before dental procedure tomorrow; script sent to pharmacy.  Pt verbalized understanding and stated "I will start it today"

## 2013-10-06 ENCOUNTER — Other Ambulatory Visit: Payer: Self-pay | Admitting: Oncology

## 2013-10-07 ENCOUNTER — Ambulatory Visit: Payer: Medicare Other

## 2013-10-07 ENCOUNTER — Other Ambulatory Visit: Payer: Medicare Other | Admitting: Lab

## 2013-10-08 ENCOUNTER — Other Ambulatory Visit (HOSPITAL_BASED_OUTPATIENT_CLINIC_OR_DEPARTMENT_OTHER): Payer: Medicare Other | Admitting: Lab

## 2013-10-08 ENCOUNTER — Telehealth: Payer: Self-pay | Admitting: Oncology

## 2013-10-08 ENCOUNTER — Telehealth: Payer: Self-pay | Admitting: *Deleted

## 2013-10-08 ENCOUNTER — Ambulatory Visit (HOSPITAL_BASED_OUTPATIENT_CLINIC_OR_DEPARTMENT_OTHER): Payer: Medicare Other

## 2013-10-08 ENCOUNTER — Ambulatory Visit (HOSPITAL_BASED_OUTPATIENT_CLINIC_OR_DEPARTMENT_OTHER): Payer: Medicare Other | Admitting: Nurse Practitioner

## 2013-10-08 VITALS — BP 138/80 | HR 86 | Temp 97.5°F | Resp 20 | Ht 67.0 in | Wt 172.0 lb

## 2013-10-08 DIAGNOSIS — G620 Drug-induced polyneuropathy: Secondary | ICD-10-CM

## 2013-10-08 DIAGNOSIS — C189 Malignant neoplasm of colon, unspecified: Secondary | ICD-10-CM

## 2013-10-08 DIAGNOSIS — C3410 Malignant neoplasm of upper lobe, unspecified bronchus or lung: Secondary | ICD-10-CM

## 2013-10-08 DIAGNOSIS — C185 Malignant neoplasm of splenic flexure: Secondary | ICD-10-CM

## 2013-10-08 DIAGNOSIS — C786 Secondary malignant neoplasm of retroperitoneum and peritoneum: Secondary | ICD-10-CM

## 2013-10-08 DIAGNOSIS — Z5112 Encounter for antineoplastic immunotherapy: Secondary | ICD-10-CM

## 2013-10-08 DIAGNOSIS — Z5111 Encounter for antineoplastic chemotherapy: Secondary | ICD-10-CM

## 2013-10-08 LAB — COMPREHENSIVE METABOLIC PANEL (CC13)
ALT: 19 U/L (ref 0–55)
Alkaline Phosphatase: 64 U/L (ref 40–150)
BUN: 18.1 mg/dL (ref 7.0–26.0)
Calcium: 10.3 mg/dL (ref 8.4–10.4)
Chloride: 107 mEq/L (ref 98–109)
Creatinine: 1.1 mg/dL (ref 0.7–1.3)
Glucose: 153 mg/dl — ABNORMAL HIGH (ref 70–140)
Sodium: 138 mEq/L (ref 136–145)
Total Bilirubin: 0.89 mg/dL (ref 0.20–1.20)

## 2013-10-08 LAB — CBC WITH DIFFERENTIAL/PLATELET
Basophils Absolute: 0 10*3/uL (ref 0.0–0.1)
EOS%: 3.9 % (ref 0.0–7.0)
Eosinophils Absolute: 0.3 10*3/uL (ref 0.0–0.5)
HGB: 16.4 g/dL (ref 13.0–17.1)
LYMPH%: 15.3 % (ref 14.0–49.0)
MCH: 29 pg (ref 27.2–33.4)
MCV: 85.7 fL (ref 79.3–98.0)
MONO#: 0.6 10*3/uL (ref 0.1–0.9)
MONO%: 8.2 % (ref 0.0–14.0)
NEUT#: 5.6 10*3/uL (ref 1.5–6.5)
Platelets: 132 10*3/uL — ABNORMAL LOW (ref 140–400)
RDW: 17.6 % — ABNORMAL HIGH (ref 11.0–14.6)

## 2013-10-08 LAB — LACTATE DEHYDROGENASE (CC13): LDH: 228 U/L (ref 125–245)

## 2013-10-08 MED ORDER — SODIUM CHLORIDE 0.9 % IV SOLN
5.0000 mg/kg | Freq: Once | INTRAVENOUS | Status: AC
  Administered 2013-10-08: 400 mg via INTRAVENOUS
  Filled 2013-10-08: qty 16

## 2013-10-08 MED ORDER — DEXAMETHASONE SODIUM PHOSPHATE 20 MG/5ML IJ SOLN
20.0000 mg | Freq: Once | INTRAMUSCULAR | Status: AC
  Administered 2013-10-08: 20 mg via INTRAVENOUS

## 2013-10-08 MED ORDER — ONDANSETRON 16 MG/50ML IVPB (CHCC)
INTRAVENOUS | Status: AC
  Filled 2013-10-08: qty 16

## 2013-10-08 MED ORDER — LEUCOVORIN CALCIUM INJECTION 350 MG
300.0000 mg/m2 | Freq: Once | INTRAVENOUS | Status: AC
  Administered 2013-10-08: 592 mg via INTRAVENOUS
  Filled 2013-10-08: qty 29.6

## 2013-10-08 MED ORDER — SODIUM CHLORIDE 0.9 % IV SOLN
Freq: Once | INTRAVENOUS | Status: AC
  Administered 2013-10-08: 13:00:00 via INTRAVENOUS

## 2013-10-08 MED ORDER — DEXAMETHASONE SODIUM PHOSPHATE 20 MG/5ML IJ SOLN
INTRAMUSCULAR | Status: AC
  Filled 2013-10-08: qty 5

## 2013-10-08 MED ORDER — FLUOROURACIL CHEMO INJECTION 2.5 GM/50ML
300.0000 mg/m2 | Freq: Once | INTRAVENOUS | Status: AC
  Administered 2013-10-08: 600 mg via INTRAVENOUS
  Filled 2013-10-08: qty 12

## 2013-10-08 MED ORDER — FLUOROURACIL CHEMO INJECTION 5 GM/100ML
1800.0000 mg/m2 | INTRAVENOUS | Status: DC
  Administered 2013-10-08: 3550 mg via INTRAVENOUS
  Filled 2013-10-08: qty 71

## 2013-10-08 MED ORDER — ATROPINE SULFATE 1 MG/ML IJ SOLN
0.5000 mg | Freq: Once | INTRAMUSCULAR | Status: AC | PRN
  Administered 2013-10-08: 0.5 mg via INTRAVENOUS

## 2013-10-08 MED ORDER — IRINOTECAN HCL CHEMO INJECTION 100 MG/5ML
135.0000 mg/m2 | Freq: Once | INTRAVENOUS | Status: AC
  Administered 2013-10-08: 266 mg via INTRAVENOUS
  Filled 2013-10-08: qty 13.3

## 2013-10-08 MED ORDER — ATROPINE SULFATE 1 MG/ML IJ SOLN
INTRAMUSCULAR | Status: AC
  Filled 2013-10-08: qty 1

## 2013-10-08 MED ORDER — ONDANSETRON 16 MG/50ML IVPB (CHCC)
16.0000 mg | Freq: Once | INTRAVENOUS | Status: AC
  Administered 2013-10-08: 16 mg via INTRAVENOUS

## 2013-10-08 NOTE — Telephone Encounter (Signed)
gv and printed appts sched and avs for pt for OCT, NOV and DEC....gv pt barium

## 2013-10-08 NOTE — Telephone Encounter (Signed)
Per staff message and POF I have scheduled appts.  JMW  

## 2013-10-08 NOTE — Progress Notes (Signed)
OFFICE PROGRESS NOTE  Interval history:   [name] is a 72 year old man with local intra-abdominal recurrence of colon cancer. He was initially diagnosed with stage III colon cancer in July 2012 status post left colectomy 07/09/2011. He developed a rise in the CEA tumor marker and 2 intra-abdominal soft tissue densities on followup CT scan in November 2012 while on adjuvant Xeloda chemotherapy. Oxaliplatin and Avastin were added to the regimen with a steady fall in the CEA. He developed cumulative neurotoxicity related to the oxaliplatin necessitating discontinuation. Xeloda was continued on a 1 week on/1 week off schedule and Avastin was continued every 2 weeks. There was a recent progressive rise in the CEA. Restaging CT evaluation 04/09/2013 showed increase in size of previously noted peritoneal implants. He underwent placement of a Port-A-Cath on 04/27/2013 and began treatment on the FOLFIRI regimen on 04/29/2013. Avastin was added beginning with cycle 3. Restaging CT evaluation on 07/27/2013 (after 6 cycles ) showed interval resolution of previously described left lower lobe nodule. There was interval improvement in peritoneal nodularity. He completed cycle 7 on 07/29/2013, cycle 8 on 08/12/2013 and cycle 9 on 08/26/2013. CEA on 08/26/2013 was in normal range at 4.2. He completed cycle 10 on 09/09/2013 at which time the CEA was 4.6. He completed cycle 11 on 09/23/2013 at which time the CEA was 4.7.  He is seen today for scheduled followup.  He has a new grandchild. His daughter delivered on 09/26/2013.  He overall is feeling well. He denies nausea/vomiting. He notes that his mouth becomes "sore" after chemotherapy. No ulcerations. He has intermittent diarrhea which is controlled with Imodium. He has occasional nosebleeds. He denies shortness of breath, cough, chest pain. No leg swelling or calf pain. He denies abdominal pain. He has a good appetite.   Objective: Blood pressure 138/80, pulse 86,  temperature 97.5 F (36.4 C), temperature source Oral, resp. rate 20, height 5\' 7"  (1.702 m), weight 172 lb (78.019 kg).  Oropharynx is without thrush or ulceration. No palpable cervical, supraclavicular or axillary lymph nodes. Lungs are clear. No wheezes or rales. Regular cardiac rhythm. Port-A-Cath site is without erythema. Abdomen is soft and nontender. No hepatomegaly. Extremities are without edema. Calves are soft and nontender. Motor strength 5 over 5. Gait normal. Alert and oriented.  Lab Results: Lab Results  Component Value Date   WBC 7.8 10/08/2013   HGB 16.4 10/08/2013   HCT 48.4 10/08/2013   MCV 85.7 10/08/2013   PLT 132* 10/08/2013    Chemistry:    Chemistry      Component Value Date/Time   NA 141 09/23/2013 1151   NA 138 04/27/2013 1300   NA 139 10/15/2011 1601   K 4.5 09/23/2013 1151   K 4.2 04/27/2013 1300   K 4.3 10/15/2011 1601   CL 108* 06/17/2013 1200   CL 104 04/27/2013 1300   CL 101 10/15/2011 1601   CO2 24 09/23/2013 1151   CO2 24 04/27/2013 1300   CO2 30 10/15/2011 1601   BUN 15.0 09/23/2013 1151   BUN 13 04/27/2013 1300   BUN 17 10/15/2011 1601   CREATININE 1.1 09/23/2013 1151   CREATININE 1.15 04/27/2013 1300   CREATININE 1.1 10/15/2011 1601      Component Value Date/Time   CALCIUM 10.0 09/23/2013 1151   CALCIUM 9.9 04/27/2013 1300   CALCIUM 9.3 10/15/2011 1601   ALKPHOS 54 09/23/2013 1151   ALKPHOS 50 12/31/2012 1632   ALKPHOS 59 10/15/2011 1601   AST 14 09/23/2013 1151  AST 19 12/31/2012 1632   AST 35 10/15/2011 1601   ALT 14 09/23/2013 1151   ALT 14 12/31/2012 1632   ALT 37 10/15/2011 1601   BILITOT 0.74 09/23/2013 1151   BILITOT 0.6 12/31/2012 1632   BILITOT 1.30 10/15/2011 1601       Studies/Results: No results found.  Medications: I have reviewed the patient's current medications.  Assessment/Plan:  1. 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 He began treatment on the FOLFIRI regimen on 04/29/2013. Avastin was added beginning with cycle 3. Restaging CT  evaluation after cycle 6 showed improvement. He completed cycle 11 on 09/23/2013. 2. Status post Port-A-Cath placement 04/27/2013. 3. Status post resection of a stage I non-small cell lung cancer left upper lung with postop brachytherapy June 2007. 4. 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 Radiographic complete regression on Avastin based chemotherapy. 5. Oxaliplatin neuropathy.  6. Type 2 diabetes. 7. History of nephrolithiasis. 8. Status post bilateral corneal transplants.  Disposition-[name] appears stable. He has completed 11 cycles of FOLFIRI/Avastin. The most recent CEA was in normal range. Plan to proceed with cycle 12 today as scheduled. He will complete a restaging CT evaluation at the end of this month. If the scan is stable to improved the plan is to continue FOLFIRI/Avastin with modifications based on the upcoming holidays.  We will schedule a followup visit with Dr. Cyndie Chime in approximately 8 weeks. Mr. Rudd knows to contact the office in between visits with any problems.  Plan reviewed with Dr. Cyndie Chime.  Lonna Cobb ANP/GNP-BC

## 2013-10-08 NOTE — Patient Instructions (Signed)
[facility] Discharge Instructions for Patients Receiving Chemotherapy  Today you received the following chemotherapy agents:  Camptosar, Leucovorin, 5FU and Avastin  To help prevent nausea and vomiting after your treatment, we encourage you to take your nausea medication as ordered per MD.   If you develop nausea and vomiting that is not controlled by your nausea medication, call the clinic.   BELOW ARE SYMPTOMS THAT SHOULD BE REPORTED IMMEDIATELY:  *FEVER GREATER THAN 100.5 F  *CHILLS WITH OR WITHOUT FEVER  NAUSEA AND VOMITING THAT IS NOT CONTROLLED WITH YOUR NAUSEA MEDICATION  *UNUSUAL SHORTNESS OF BREATH  *UNUSUAL BRUISING OR BLEEDING  TENDERNESS IN MOUTH AND THROAT WITH OR WITHOUT PRESENCE OF ULCERS  *URINARY PROBLEMS  *BOWEL PROBLEMS  UNUSUAL RASH Items with * indicate a potential emergency and should be followed up as soon as possible.  Feel free to call the clinic you have any questions or concerns. The clinic phone number is [phone].    

## 2013-10-10 ENCOUNTER — Ambulatory Visit (HOSPITAL_BASED_OUTPATIENT_CLINIC_OR_DEPARTMENT_OTHER): Payer: Medicare Other

## 2013-10-10 VITALS — BP 140/72 | HR 68 | Temp 97.1°F | Resp 20

## 2013-10-10 DIAGNOSIS — C185 Malignant neoplasm of splenic flexure: Secondary | ICD-10-CM

## 2013-10-10 MED ORDER — HEPARIN SOD (PORK) LOCK FLUSH 100 UNIT/ML IV SOLN
500.0000 [IU] | Freq: Once | INTRAVENOUS | Status: AC | PRN
  Administered 2013-10-10: 500 [IU]
  Filled 2013-10-10: qty 5

## 2013-10-10 MED ORDER — SODIUM CHLORIDE 0.9 % IJ SOLN
10.0000 mL | INTRAMUSCULAR | Status: DC | PRN
  Administered 2013-10-10: 10 mL
  Filled 2013-10-10: qty 10

## 2013-10-19 ENCOUNTER — Ambulatory Visit (HOSPITAL_COMMUNITY)
Admission: RE | Admit: 2013-10-19 | Discharge: 2013-10-19 | Disposition: A | Discharge status: Home / Self Care | Payer: Medicare Other | Source: Ambulatory Visit | Attending: Nurse Practitioner | Admitting: Nurse Practitioner

## 2013-10-19 DIAGNOSIS — K7689 Other specified diseases of liver: Secondary | ICD-10-CM | POA: Insufficient documentation

## 2013-10-19 DIAGNOSIS — N400 Enlarged prostate without lower urinary tract symptoms: Secondary | ICD-10-CM | POA: Insufficient documentation

## 2013-10-19 DIAGNOSIS — C801 Malignant (primary) neoplasm, unspecified: Secondary | ICD-10-CM | POA: Insufficient documentation

## 2013-10-19 DIAGNOSIS — K5730 Diverticulosis of large intestine without perforation or abscess without bleeding: Secondary | ICD-10-CM | POA: Insufficient documentation

## 2013-10-19 DIAGNOSIS — J438 Other emphysema: Secondary | ICD-10-CM | POA: Insufficient documentation

## 2013-10-19 DIAGNOSIS — Q762 Congenital spondylolisthesis: Secondary | ICD-10-CM | POA: Insufficient documentation

## 2013-10-19 DIAGNOSIS — M439 Deforming dorsopathy, unspecified: Secondary | ICD-10-CM | POA: Insufficient documentation

## 2013-10-19 DIAGNOSIS — C189 Malignant neoplasm of colon, unspecified: Secondary | ICD-10-CM | POA: Insufficient documentation

## 2013-10-19 MED ORDER — IOHEXOL 300 MG/ML  SOLN
100.0000 mL | Freq: Once | INTRAMUSCULAR | Status: AC | PRN
  Administered 2013-10-19: 100 mL via INTRAVENOUS

## 2013-10-29 ENCOUNTER — Other Ambulatory Visit: Payer: Self-pay

## 2013-11-03 ENCOUNTER — Other Ambulatory Visit (HOSPITAL_BASED_OUTPATIENT_CLINIC_OR_DEPARTMENT_OTHER): Payer: Medicare Other | Admitting: Lab

## 2013-11-03 ENCOUNTER — Telehealth: Payer: Self-pay | Admitting: *Deleted

## 2013-11-03 ENCOUNTER — Ambulatory Visit (HOSPITAL_BASED_OUTPATIENT_CLINIC_OR_DEPARTMENT_OTHER): Payer: Medicare Other

## 2013-11-03 DIAGNOSIS — C185 Malignant neoplasm of splenic flexure: Secondary | ICD-10-CM

## 2013-11-03 DIAGNOSIS — C3490 Malignant neoplasm of unspecified part of unspecified bronchus or lung: Secondary | ICD-10-CM

## 2013-11-03 DIAGNOSIS — C189 Malignant neoplasm of colon, unspecified: Secondary | ICD-10-CM

## 2013-11-03 DIAGNOSIS — Z5111 Encounter for antineoplastic chemotherapy: Secondary | ICD-10-CM

## 2013-11-03 DIAGNOSIS — C786 Secondary malignant neoplasm of retroperitoneum and peritoneum: Secondary | ICD-10-CM

## 2013-11-03 DIAGNOSIS — Z5112 Encounter for antineoplastic immunotherapy: Secondary | ICD-10-CM

## 2013-11-03 LAB — CBC WITH DIFFERENTIAL/PLATELET
BASO%: 0.5 % (ref 0.0–2.0)
EOS%: 5.8 % (ref 0.0–7.0)
MCH: 29.1 pg (ref 27.2–33.4)
MCHC: 33.6 g/dL (ref 32.0–36.0)
MONO#: 0.6 10*3/uL (ref 0.1–0.9)
Platelets: 128 10*3/uL — ABNORMAL LOW (ref 140–400)
RBC: 5.61 10*6/uL (ref 4.20–5.82)
RDW: 19 % — ABNORMAL HIGH (ref 11.0–14.6)
WBC: 8.6 10*3/uL (ref 4.0–10.3)
lymph#: 1.5 10*3/uL (ref 0.9–3.3)
nRBC: 0 % (ref 0–0)

## 2013-11-03 LAB — COMPREHENSIVE METABOLIC PANEL (CC13)
ALT: 20 U/L (ref 0–55)
AST: 20 U/L (ref 5–34)
Albumin: 3.6 g/dL (ref 3.5–5.0)
Alkaline Phosphatase: 55 U/L (ref 40–150)
Glucose: 112 mg/dl (ref 70–140)
Potassium: 4.4 mEq/L (ref 3.5–5.1)
Sodium: 140 mEq/L (ref 136–145)
Total Bilirubin: 0.79 mg/dL (ref 0.20–1.20)
Total Protein: 6.5 g/dL (ref 6.4–8.3)

## 2013-11-03 LAB — UA PROTEIN, DIPSTICK - CHCC: Protein, ur: 30 mg/dL

## 2013-11-03 LAB — LACTATE DEHYDROGENASE (CC13): LDH: 253 U/L — ABNORMAL HIGH (ref 125–245)

## 2013-11-03 MED ORDER — SODIUM CHLORIDE 0.9 % IJ SOLN
10.0000 mL | INTRAMUSCULAR | Status: DC | PRN
  Filled 2013-11-03: qty 10

## 2013-11-03 MED ORDER — DEXAMETHASONE SODIUM PHOSPHATE 20 MG/5ML IJ SOLN
20.0000 mg | Freq: Once | INTRAMUSCULAR | Status: AC
  Administered 2013-11-03: 20 mg via INTRAVENOUS

## 2013-11-03 MED ORDER — ATROPINE SULFATE 1 MG/ML IJ SOLN
0.5000 mg | Freq: Once | INTRAMUSCULAR | Status: AC | PRN
  Administered 2013-11-03: 0.5 mg via INTRAVENOUS

## 2013-11-03 MED ORDER — LEUCOVORIN CALCIUM INJECTION 350 MG
300.0000 mg/m2 | Freq: Once | INTRAMUSCULAR | Status: AC
  Administered 2013-11-03: 592 mg via INTRAVENOUS
  Filled 2013-11-03: qty 29.6

## 2013-11-03 MED ORDER — FLUOROURACIL CHEMO INJECTION 5 GM/100ML
1800.0000 mg/m2 | INTRAVENOUS | Status: DC
  Administered 2013-11-03: 3550 mg via INTRAVENOUS
  Filled 2013-11-03: qty 71

## 2013-11-03 MED ORDER — SODIUM CHLORIDE 0.9 % IV SOLN
5.0000 mg/kg | Freq: Once | INTRAVENOUS | Status: AC
  Administered 2013-11-03: 400 mg via INTRAVENOUS
  Filled 2013-11-03: qty 16

## 2013-11-03 MED ORDER — ATROPINE SULFATE 1 MG/ML IJ SOLN
INTRAMUSCULAR | Status: AC
  Filled 2013-11-03: qty 1

## 2013-11-03 MED ORDER — ONDANSETRON 16 MG/50ML IVPB (CHCC)
16.0000 mg | Freq: Once | INTRAVENOUS | Status: AC
  Administered 2013-11-03: 16 mg via INTRAVENOUS

## 2013-11-03 MED ORDER — ONDANSETRON 16 MG/50ML IVPB (CHCC)
INTRAVENOUS | Status: AC
  Filled 2013-11-03: qty 16

## 2013-11-03 MED ORDER — FLUOROURACIL CHEMO INJECTION 2.5 GM/50ML
300.0000 mg/m2 | Freq: Once | INTRAVENOUS | Status: AC
  Administered 2013-11-03: 600 mg via INTRAVENOUS
  Filled 2013-11-03: qty 12

## 2013-11-03 MED ORDER — DEXTROSE 5 % IV SOLN
135.0000 mg/m2 | Freq: Once | INTRAVENOUS | Status: AC
  Administered 2013-11-03: 266 mg via INTRAVENOUS
  Filled 2013-11-03: qty 13.3

## 2013-11-03 MED ORDER — SODIUM CHLORIDE 0.9 % IV SOLN
Freq: Once | INTRAVENOUS | Status: AC
  Administered 2013-11-03: 13:00:00 via INTRAVENOUS

## 2013-11-03 MED ORDER — DEXAMETHASONE SODIUM PHOSPHATE 20 MG/5ML IJ SOLN
INTRAMUSCULAR | Status: AC
Start: 2013-11-03 — End: 2013-11-03
  Filled 2013-11-03: qty 5

## 2013-11-03 NOTE — Telephone Encounter (Signed)
Message copied by Gala Romney on Tue Nov 03, 2013  4:04 PM ------      Message from: Levert Feinstein      Created: Mon Oct 19, 2013  4:09 PM       Call pt: scan looks great! ------

## 2013-11-03 NOTE — Patient Instructions (Signed)
RETURN FOR PUMP DISCONNECT Thursday AT   [facility] Discharge Instructions for Patients Receiving Chemotherapy  Today you received the following chemotherapy agents: avastin, irinotecan, leucovorin, 31fu.  To help prevent nausea and vomiting after your treatment, we encourage you to take your nausea medication.  Take it as often as prescribed.     If you develop nausea and vomiting that is not controlled by your nausea medication, call the clinic. If it is after clinic hours your family physician or the after hours number for the clinic or go to the Emergency Department.   BELOW ARE SYMPTOMS THAT SHOULD BE REPORTED IMMEDIATELY:  *FEVER GREATER THAN 100.5 F  *CHILLS WITH OR WITHOUT FEVER  NAUSEA AND VOMITING THAT IS NOT CONTROLLED WITH YOUR NAUSEA MEDICATION  *UNUSUAL SHORTNESS OF BREATH  *UNUSUAL BRUISING OR BLEEDING  TENDERNESS IN MOUTH AND THROAT WITH OR WITHOUT PRESENCE OF ULCERS  *URINARY PROBLEMS  *BOWEL PROBLEMS  UNUSUAL RASH Items with * indicate a potential emergency and should be followed up as soon as possible.  Feel free to call the clinic you have any questions or concerns. The clinic phone number is [phone].   I have been informed and understand all the instructions given to me. I know to contact the clinic, my physician, or go to the Emergency Department if any problems should occur. I do not have any questions at this time, but understand that I may call the clinic during office hours   should I have any questions or need assistance in obtaining follow up care.    __________________________________________  _____________  __________ Signature of Patient or Authorized Representative            Date                   Time    __________________________________________ Nurse's Signature

## 2013-11-03 NOTE — Telephone Encounter (Signed)
Spoke with pt in infusion area today asking if pt aware of scan results.  Pt stated he did see scan results on MyChart and thought all was fine.  Informed pt per Dr. Cyndie Chime that scans look great;  and confirmed appt with MD 12/07/13.

## 2013-11-04 ENCOUNTER — Other Ambulatory Visit: Payer: Self-pay | Admitting: Oncology

## 2013-11-04 LAB — CEA: CEA: 5.3 ng/mL — ABNORMAL HIGH (ref 0.0–5.0)

## 2013-11-05 ENCOUNTER — Ambulatory Visit (HOSPITAL_BASED_OUTPATIENT_CLINIC_OR_DEPARTMENT_OTHER): Payer: Medicare Other

## 2013-11-05 VITALS — BP 126/66 | HR 56 | Temp 98.0°F

## 2013-11-05 DIAGNOSIS — C189 Malignant neoplasm of colon, unspecified: Secondary | ICD-10-CM

## 2013-11-05 DIAGNOSIS — Z452 Encounter for adjustment and management of vascular access device: Secondary | ICD-10-CM

## 2013-11-05 DIAGNOSIS — C185 Malignant neoplasm of splenic flexure: Secondary | ICD-10-CM

## 2013-11-05 MED ORDER — SODIUM CHLORIDE 0.9 % IJ SOLN
10.0000 mL | INTRAMUSCULAR | Status: DC | PRN
  Administered 2013-11-05: 10 mL
  Filled 2013-11-05: qty 10

## 2013-11-05 MED ORDER — HEPARIN SOD (PORK) LOCK FLUSH 100 UNIT/ML IV SOLN
500.0000 [IU] | Freq: Once | INTRAVENOUS | Status: AC | PRN
  Administered 2013-11-05: 500 [IU]
  Filled 2013-11-05: qty 5

## 2013-11-23 ENCOUNTER — Ambulatory Visit (HOSPITAL_BASED_OUTPATIENT_CLINIC_OR_DEPARTMENT_OTHER): Payer: Medicare Other

## 2013-11-23 ENCOUNTER — Other Ambulatory Visit (HOSPITAL_BASED_OUTPATIENT_CLINIC_OR_DEPARTMENT_OTHER): Payer: Medicare Other | Admitting: Lab

## 2013-11-23 VITALS — BP 129/76 | HR 83 | Temp 97.0°F

## 2013-11-23 DIAGNOSIS — C189 Malignant neoplasm of colon, unspecified: Secondary | ICD-10-CM

## 2013-11-23 DIAGNOSIS — C786 Secondary malignant neoplasm of retroperitoneum and peritoneum: Secondary | ICD-10-CM

## 2013-11-23 DIAGNOSIS — Z5111 Encounter for antineoplastic chemotherapy: Secondary | ICD-10-CM

## 2013-11-23 DIAGNOSIS — C185 Malignant neoplasm of splenic flexure: Secondary | ICD-10-CM

## 2013-11-23 LAB — CBC WITH DIFFERENTIAL/PLATELET
Basophils Absolute: 0 10*3/uL (ref 0.0–0.1)
Eosinophils Absolute: 0.7 10*3/uL — ABNORMAL HIGH (ref 0.0–0.5)
HCT: 51.1 % — ABNORMAL HIGH (ref 38.4–49.9)
HGB: 16.8 g/dL (ref 13.0–17.1)
MCH: 28.4 pg (ref 27.2–33.4)
MCV: 86.3 fL (ref 79.3–98.0)
MONO#: 0.8 10*3/uL (ref 0.1–0.9)
NEUT#: 5.5 10*3/uL (ref 1.5–6.5)
NEUT%: 66.4 % (ref 39.0–75.0)
RDW: 17.4 % — ABNORMAL HIGH (ref 11.0–14.6)
lymph#: 1.3 10*3/uL (ref 0.9–3.3)

## 2013-11-23 LAB — UA PROTEIN, DIPSTICK - CHCC: Protein, ur: 100 mg/dL

## 2013-11-23 MED ORDER — SODIUM CHLORIDE 0.9 % IV SOLN
1800.0000 mg/m2 | INTRAVENOUS | Status: DC
  Administered 2013-11-23: 3550 mg via INTRAVENOUS
  Filled 2013-11-23: qty 71

## 2013-11-23 MED ORDER — DEXTROSE 5 % IV SOLN
135.0000 mg/m2 | Freq: Once | INTRAVENOUS | Status: AC
  Administered 2013-11-23: 260 mg via INTRAVENOUS
  Filled 2013-11-23: qty 13

## 2013-11-23 MED ORDER — ONDANSETRON 16 MG/50ML IVPB (CHCC)
INTRAVENOUS | Status: AC
  Filled 2013-11-23: qty 16

## 2013-11-23 MED ORDER — ATROPINE SULFATE 1 MG/ML IJ SOLN
0.5000 mg | Freq: Once | INTRAMUSCULAR | Status: AC | PRN
  Administered 2013-11-23: 0.5 mg via INTRAVENOUS

## 2013-11-23 MED ORDER — DEXAMETHASONE SODIUM PHOSPHATE 20 MG/5ML IJ SOLN
20.0000 mg | Freq: Once | INTRAMUSCULAR | Status: AC
  Administered 2013-11-23: 20 mg via INTRAVENOUS

## 2013-11-23 MED ORDER — SODIUM CHLORIDE 0.9 % IV SOLN
Freq: Once | INTRAVENOUS | Status: AC
  Administered 2013-11-23: 13:00:00 via INTRAVENOUS

## 2013-11-23 MED ORDER — SODIUM CHLORIDE 0.9 % IV SOLN
5.0000 mg/kg | Freq: Once | INTRAVENOUS | Status: AC
  Administered 2013-11-23: 400 mg via INTRAVENOUS
  Filled 2013-11-23: qty 16

## 2013-11-23 MED ORDER — LEUCOVORIN CALCIUM INJECTION 350 MG
300.0000 mg/m2 | Freq: Once | INTRAVENOUS | Status: AC
  Administered 2013-11-23: 600 mg via INTRAVENOUS
  Filled 2013-11-23: qty 30

## 2013-11-23 MED ORDER — FLUOROURACIL CHEMO INJECTION 2.5 GM/50ML
300.0000 mg/m2 | Freq: Once | INTRAVENOUS | Status: AC
  Administered 2013-11-23: 600 mg via INTRAVENOUS
  Filled 2013-11-23: qty 12

## 2013-11-23 MED ORDER — ONDANSETRON 16 MG/50ML IVPB (CHCC)
16.0000 mg | Freq: Once | INTRAVENOUS | Status: AC
  Administered 2013-11-23: 16 mg via INTRAVENOUS

## 2013-11-23 MED ORDER — DEXAMETHASONE SODIUM PHOSPHATE 20 MG/5ML IJ SOLN
INTRAMUSCULAR | Status: AC
  Filled 2013-11-23: qty 5

## 2013-11-23 MED ORDER — ATROPINE SULFATE 1 MG/ML IJ SOLN
INTRAMUSCULAR | Status: AC
  Filled 2013-11-23: qty 1

## 2013-11-23 NOTE — Patient Instructions (Signed)
[facility] Discharge Instructions for Patients Receiving Chemotherapy  Today you received the following chemotherapy agents avastin,  Leucovorin, CPT-11, adrucil and adrucil pump.  To help prevent nausea and vomiting after your treatment, we encourage you to take your nausea medication zofran.   If you develop nausea and vomiting that is not controlled by your nausea medication, call the clinic.   BELOW ARE SYMPTOMS THAT SHOULD BE REPORTED IMMEDIATELY:  *FEVER GREATER THAN 100.5 F  *CHILLS WITH OR WITHOUT FEVER  NAUSEA AND VOMITING THAT IS NOT CONTROLLED WITH YOUR NAUSEA MEDICATION  *UNUSUAL SHORTNESS OF BREATH  *UNUSUAL BRUISING OR BLEEDING  TENDERNESS IN MOUTH AND THROAT WITH OR WITHOUT PRESENCE OF ULCERS  *URINARY PROBLEMS  *BOWEL PROBLEMS  UNUSUAL RASH Items with * indicate a potential emergency and should be followed up as soon as possible.  Feel free to call the clinic you have any questions or concerns. The clinic phone number is [phone].

## 2013-11-25 ENCOUNTER — Ambulatory Visit (HOSPITAL_BASED_OUTPATIENT_CLINIC_OR_DEPARTMENT_OTHER): Payer: Medicare Other

## 2013-11-25 ENCOUNTER — Other Ambulatory Visit: Payer: Self-pay | Admitting: *Deleted

## 2013-11-25 VITALS — BP 132/79 | HR 86 | Temp 96.6°F | Resp 20

## 2013-11-25 DIAGNOSIS — C786 Secondary malignant neoplasm of retroperitoneum and peritoneum: Secondary | ICD-10-CM

## 2013-11-25 DIAGNOSIS — C189 Malignant neoplasm of colon, unspecified: Secondary | ICD-10-CM

## 2013-11-25 DIAGNOSIS — Z452 Encounter for adjustment and management of vascular access device: Secondary | ICD-10-CM

## 2013-11-25 DIAGNOSIS — C185 Malignant neoplasm of splenic flexure: Secondary | ICD-10-CM

## 2013-11-25 MED ORDER — HEPARIN SOD (PORK) LOCK FLUSH 100 UNIT/ML IV SOLN
500.0000 [IU] | Freq: Once | INTRAVENOUS | Status: AC | PRN
  Administered 2013-11-25: 500 [IU]
  Filled 2013-11-25: qty 5

## 2013-11-25 MED ORDER — LORAZEPAM 0.5 MG PO TABS: ORAL_TABLET | ORAL | Status: DC

## 2013-11-25 MED ORDER — SODIUM CHLORIDE 0.9 % IJ SOLN
10.0000 mL | INTRAMUSCULAR | Status: DC | PRN
  Administered 2013-11-25: 10 mL
  Filled 2013-11-25: qty 10

## 2013-11-25 NOTE — Patient Instructions (Signed)
Implanted Port Instructions  An implanted port is a central line that has a round shape and is placed under the skin. It is used for long-term IV (intravenous) access for:  · Medicine.  · Fluids.  · Liquid nutrition, such as TPN (total parenteral nutrition).  · Blood samples.  Ports can be placed:  · In the chest area just below the collarbone (this is the most common place.)  · In the arms.  · In the belly (abdomen) area.  · In the legs.  PARTS OF THE PORT  A port has 2 main parts:  · The reservoir. The reservoir is round, disc-shaped, and will be a small, raised area under your skin.  · The reservoir is the part where a needle is inserted (accessed) to either give medicines or to draw blood.  · The catheter. The catheter is a long, slender tube that extends from the reservoir. The catheter is placed into a large vein.  · Medicine that is inserted into the reservoir goes into the catheter and then into the vein.  INSERTION OF THE PORT  · The port is surgically placed in either an operating room or in a procedural area (interventional radiology).  · Medicine may be given to help you relax during the procedure.  · The skin where the port will be inserted is numbed (local anesthetic).  · 1 or 2 small cuts (incisions) will be made in the skin to insert the port.  · The port can be used after it has been inserted.  INCISION SITE CARE  · The incision site may have small adhesive strips on it. This helps keep the incision site closed. Sometimes, no adhesive strips are placed. Instead of adhesive strips, a special kind of surgical glue is used to keep the incision closed.  · If adhesive strips were placed on the incision sites, do not take them off. They will fall off on their own.  · The incision site may be sore for 1 to 2 days. Pain medicine can help.  · Do not get the incision site wet. Bathe or shower as directed by your caregiver.  · The incision site should heal in 5 to 7 days. A small scar may form after the  incision has healed.  ACCESSING THE PORT  Special steps must be taken to access the port:  · Before the port is accessed, a numbing cream can be placed on the skin. This helps numb the skin over the port site.  · A sterile technique is used to access the port.  · The port is accessed with a needle. Only "non-coring" port needles should be used to access the port. Once the port is accessed, a blood return should be checked. This helps ensure the port is in the vein and is not clogged (clotted).  · If your caregiver believes your port should remain accessed, a clear (transparent) bandage will be placed over the needle site. The bandage and needle will need to be changed every week or as directed by your caregiver.  · Keep the bandage covering the needle clean and dry. Do not get it wet. Follow your caregiver's instructions on how to take a shower or bath when the port is accessed.  · If your port does not need to stay accessed, no bandage is needed over the port.  FLUSHING THE PORT  Flushing the port keeps it from getting clogged. How often the port is flushed depends on:  · If a   constant infusion is running. If a constant infusion is running, the port may not need to be flushed.  · If intermittent medicines are given.  · If the port is not being used.  For intermittent medicines:  · The port will need to be flushed:  · After medicines have been given.  · After blood has been drawn.  · As part of routine maintenance.  · A port is normally flushed with:  · Normal saline.  · Heparin.  · Follow your caregiver's advice on how often, how much, and the type of flush to use on your port.  IMPORTANT PORT INFORMATION  · Tell your caregiver if you are allergic to heparin.  · After your port is placed, you will get a manufacturer's information card. The card has information about your port. Keep this card with you at all times.  · There are many types of ports available. Know what kind of port you have.  · In case of an  emergency, it may be helpful to wear a medical alert bracelet. This can help alert health care workers that you have a port.  · The port can stay in for as long as your caregiver believes it is necessary.  · When it is time for the port to come out, surgery will be done to remove it. The surgery will be similar to how the port was put in.  · If you are in the hospital or clinic:  · Your port will be taken care of and flushed by a nurse.  · If you are at home:  · A home health care nurse may give medicines and take care of the port.  · You or a family member can get special training and directions for giving medicine and taking care of the port at home.  SEEK IMMEDIATE MEDICAL CARE IF:   · Your port does not flush or you are unable to get a blood return.  · New drainage or pus is coming from the incision.  · A bad smell is coming from the incision site.  · You develop swelling or increased redness at the incision site.  · You develop increased swelling or pain at the port site.  · You develop swelling or pain in the surrounding skin near the port.  · You have an oral temperature above 102° F (38.9° C), not controlled by medicine.  MAKE SURE YOU:   · Understand these instructions.  · Will watch your condition.  · Will get help right away if you are not doing well or get worse.  Document Released: 12/10/2005 Document Revised: 03/03/2012 Document Reviewed: 03/03/2009  [employer]® Patient Information ©2014 [employer].

## 2013-11-30 ENCOUNTER — Telehealth: Payer: Self-pay | Admitting: *Deleted

## 2013-11-30 NOTE — Telephone Encounter (Signed)
Received call from pt stating that he would like to cancel his chemo 12/07/13 due to having such a bad week after last chemo.  He states that he had constipation, then diarrhea & felt nauseated.  He has a lab & MD visit that same day & was encouraged to keep these.  His concern is feeling bad for Christmas & having family in.  Informed that I would discuss with Dr. Cyndie Chime before canceling treatment.

## 2013-12-07 ENCOUNTER — Telehealth: Payer: Self-pay | Admitting: Oncology

## 2013-12-07 ENCOUNTER — Ambulatory Visit (HOSPITAL_BASED_OUTPATIENT_CLINIC_OR_DEPARTMENT_OTHER): Payer: Medicare Other

## 2013-12-07 ENCOUNTER — Other Ambulatory Visit (HOSPITAL_BASED_OUTPATIENT_CLINIC_OR_DEPARTMENT_OTHER): Payer: Medicare Other

## 2013-12-07 ENCOUNTER — Ambulatory Visit (HOSPITAL_BASED_OUTPATIENT_CLINIC_OR_DEPARTMENT_OTHER): Payer: Medicare Other | Admitting: Oncology

## 2013-12-07 VITALS — BP 119/73 | HR 82 | Temp 96.6°F | Resp 18 | Ht 67.0 in | Wt 176.5 lb

## 2013-12-07 DIAGNOSIS — C185 Malignant neoplasm of splenic flexure: Secondary | ICD-10-CM

## 2013-12-07 DIAGNOSIS — C786 Secondary malignant neoplasm of retroperitoneum and peritoneum: Secondary | ICD-10-CM

## 2013-12-07 DIAGNOSIS — C189 Malignant neoplasm of colon, unspecified: Secondary | ICD-10-CM

## 2013-12-07 DIAGNOSIS — C3490 Malignant neoplasm of unspecified part of unspecified bronchus or lung: Secondary | ICD-10-CM

## 2013-12-07 DIAGNOSIS — E119 Type 2 diabetes mellitus without complications: Secondary | ICD-10-CM

## 2013-12-07 DIAGNOSIS — G620 Drug-induced polyneuropathy: Secondary | ICD-10-CM

## 2013-12-07 DIAGNOSIS — C3410 Malignant neoplasm of upper lobe, unspecified bronchus or lung: Secondary | ICD-10-CM

## 2013-12-07 DIAGNOSIS — Z5112 Encounter for antineoplastic immunotherapy: Secondary | ICD-10-CM

## 2013-12-07 LAB — CBC WITH DIFFERENTIAL/PLATELET
BASO%: 0.6 % (ref 0.0–2.0)
Basophils Absolute: 0.1 10*3/uL (ref 0.0–0.1)
Eosinophils Absolute: 0.4 10*3/uL (ref 0.0–0.5)
HCT: 47.2 % (ref 38.4–49.9)
HGB: 15.8 g/dL (ref 13.0–17.1)
LYMPH%: 12.5 % — ABNORMAL LOW (ref 14.0–49.0)
MCV: 86.6 fL (ref 79.3–98.0)
MONO#: 0.9 10*3/uL (ref 0.1–0.9)
MONO%: 9 % (ref 0.0–14.0)
NEUT#: 7 10*3/uL — ABNORMAL HIGH (ref 1.5–6.5)
NEUT%: 73.4 % (ref 39.0–75.0)
Platelets: 163 10*3/uL (ref 140–400)
WBC: 9.6 10*3/uL (ref 4.0–10.3)

## 2013-12-07 LAB — COMPREHENSIVE METABOLIC PANEL (CC13)
AST: 15 U/L (ref 5–34)
Albumin: 3.4 g/dL — ABNORMAL LOW (ref 3.5–5.0)
Alkaline Phosphatase: 59 U/L (ref 40–150)
Anion Gap: 9 mEq/L (ref 3–11)
BUN: 11.9 mg/dL (ref 7.0–26.0)
CO2: 24 mEq/L (ref 22–29)
Creatinine: 1.2 mg/dL (ref 0.7–1.3)
Glucose: 120 mg/dl (ref 70–140)
Sodium: 140 mEq/L (ref 136–145)
Total Bilirubin: 0.83 mg/dL (ref 0.20–1.20)
Total Protein: 6.4 g/dL (ref 6.4–8.3)

## 2013-12-07 MED ORDER — SODIUM CHLORIDE 0.9 % IV SOLN
Freq: Once | INTRAVENOUS | Status: AC
  Administered 2013-12-07: 12:00:00 via INTRAVENOUS

## 2013-12-07 MED ORDER — SODIUM CHLORIDE 0.9 % IV SOLN
5.0000 mg/kg | Freq: Once | INTRAVENOUS | Status: AC
  Administered 2013-12-07: 400 mg via INTRAVENOUS
  Filled 2013-12-07: qty 16

## 2013-12-07 NOTE — Telephone Encounter (Signed)
gv and printed papt sched and avs for pt for DEC thru March 2015...sed added tx.

## 2013-12-07 NOTE — Telephone Encounter (Signed)
pt requested to have all tx on WEdnesdays...done

## 2013-12-07 NOTE — Progress Notes (Signed)
Hematology and Oncology Follow Up Visit  [name] [id number] 1941-02-27 72 y.o. 12/07/2013 8:02 PM   Principle Diagnosis: Encounter Diagnoses  Name Primary?  . NEOPLASM, MALIGNANT, LUNG, NON-SMALL CELL   . Colon cancer metastasized to multiple sites Yes  . 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 He developed a rise in CEA tumor marker and 2 intra-abdominal soft tissue densities on followup CT scan done in November 2012 while on adjuvant Xeloda chemotherapy. Oxaliplatin and Avastin were added to his regimen. Oxaliplatinum was started 11/26/2011. He had a steady fall in his CEA tumor marker from peak value of 12.6 on 11/02/2011 to 2.3 by July 10. Unfortunately, he developed cumulative neurotoxicity from the Oxaliplatinum. We were following his symptoms closely but he had a sudden worsening of neuropathy, hands greater than feet, and was having trouble buttoning buttons and uncomfortable dysesthesias. I had to stop the Oxaliplatinum. He continued on Xeloda one week on 1 week off and every two-week Avastin until April 2014 when CEA began to slowly rise again to a peak of 17.7 by May 7. Followup CT scans done April 16 showed a new, indeterminate nodule at the left lung base 1.8 cm. There was an increase in the size and number of multiple peritoneal nodules largest up to 1.5 cm and a 2.1 cm soft tissue implant along the surface of the gastric antrum. I reviewed his chemotherapy plan. He was not exposed to Irinotecan. Beginning on May 7, I started him on the FOLFIRI regimen. I had to make a 25% dose reduction after the first treatment due to significant mucositis.  He has had a nice response to this regimen with fall in his CEA tumor marker down to 4.2 by September 3. Minimal change  since that time with current value of 5.3 on 11/03/2013. There has been complete regression of soft tissue abnormalities in the abdomen and pelvis as of the most recent study of 10/19/2013. He. As an unexpected positive event, right lower lobe pulmonary nodule has also completely regressed. Some of this has come at the expense of chemotherapy related toxicity. He initially lost almost 10 pounds in weight compared with his weight in June but this has slowly come back up. He is having some intermittent diarrhea and nausea. Increasing weakness and fatigue. He has now been on the FOLFIRI for 7 months.   Medications: reviewed  Allergies: No Known Allergies  Review of Systems: Hematology: No bleeding or bruising ENT ROS: No sore throat Respiratory ROS: No cough or dyspnea Cardiovascular ROS:   No chest pain or palpitations Gastrointestinal ROS:   See above Genito-Urinary ROS: Not questioned Musculoskeletal ROS: No muscle bone or joint pain Neurological ROS: No headache or focal weakness Dermatological ROS: No rash or ecchymosis Remaining ROS negative  Physical Exam: Blood pressure 119/73, pulse 82, temperature 96.6 F (35.9 C), resp. rate 18, height 5\' 7"  (1.702 m), weight 176 lb 8 oz (80.06 kg). Wt Readings from Last 3 Encounters:  12/07/13 176 lb 8 oz (80.06 kg)  10/08/13 172 lb (78.019 kg)  08/31/13 167 lb 8 oz (75.978 kg)     General appearance: Adequately nourished Caucasian man he HENNT: Pharynx no erythema, exudate, mass, or ulcer. No thyromegaly or thyroid nodules Lymph nodes: No cervical, supraclavicular, or axillary lymphadenopathy Breasts:  Lungs: Clear  to auscultation, resonant to percussion throughout Heart: Regular rhythm, no murmur, no gallop, no rub, no click, no edema Abdomen: Soft, nontender, normal bowel sounds, no mass, no organomegaly Extremities: No edema, no calf tenderness Musculoskeletal: no joint deformities GU:  Vascular: Carotid pulses 2+, no  bruits Neurologic: Alert, oriented, PERRLA,  exudate, cranial nerves grossly normal, decreased vision left eye. Status post corneal transplant. motor strength 5 over 5, reflexes 1+ symmetric, upper body coordination normal, gait normal, minimal decrease in vibration over the fingertips by tuning fork exam Skin: No rash or ecchymosis  Lab Results: CBC W/Diff    Component Value Date/Time   WBC 9.6 12/07/2013 0951   WBC 10.2 04/27/2013 1300   RBC 5.45 12/07/2013 0951   RBC 5.31 04/27/2013 1300   HGB 15.8 12/07/2013 0951   HGB 17.1* 04/27/2013 1300   HCT 47.2 12/07/2013 0951   HCT 48.5 04/27/2013 1300   PLT 163 12/07/2013 0951   PLT 134* 04/27/2013 1300   MCV 86.6 12/07/2013 0951   MCV 91.3 04/27/2013 1300   MCH 28.9 12/07/2013 0951   MCH 32.2 04/27/2013 1300   MCHC 33.4 12/07/2013 0951   MCHC 35.3 04/27/2013 1300   RDW 18.6* 12/07/2013 0951   RDW 18.1* 04/27/2013 1300   LYMPHSABS 1.2 12/07/2013 0951   LYMPHSABS 1.2 07/02/2011 1100   MONOABS 0.9 12/07/2013 0951   MONOABS 0.5 07/02/2011 1100   EOSABS 0.4 12/07/2013 0951   EOSABS 0.4 07/02/2011 1100   BASOSABS 0.1 12/07/2013 0951   BASOSABS 0.0 07/02/2011 1100     Chemistry      Component Value Date/Time   NA 140 12/07/2013 0952   NA 138 04/27/2013 1300   NA 139 10/15/2011 1601   K 4.1 12/07/2013 0952   K 4.2 04/27/2013 1300   K 4.3 10/15/2011 1601   CL 108* 06/17/2013 1200   CL 104 04/27/2013 1300   CL 101 10/15/2011 1601   CO2 24 12/07/2013 0952   CO2 24 04/27/2013 1300   CO2 30 10/15/2011 1601   BUN 11.9 12/07/2013 0952   BUN 13 04/27/2013 1300   BUN 17 10/15/2011 1601   CREATININE 1.2 12/07/2013 0952   CREATININE 1.15 04/27/2013 1300   CREATININE 1.1 10/15/2011 1601      Component Value Date/Time   CALCIUM 10.1 12/07/2013 0952   CALCIUM 9.9 04/27/2013 1300   CALCIUM 9.3 10/15/2011 1601   ALKPHOS 59 12/07/2013 0952   ALKPHOS 50 12/31/2012 1632   ALKPHOS 59 10/15/2011 1601   AST 15 12/07/2013 0952   AST 19 12/31/2012 1632   AST 35 10/15/2011 1601    ALT 13 12/07/2013 0952   ALT 14 12/31/2012 1632   ALT 37 10/15/2011 1601   BILITOT 0.83 12/07/2013 0952   BILITOT 0.6 12/31/2012 1632   BILITOT 1.30 10/15/2011 1601       Radiological Studies: See discussion above   Impression:  #1. Colon cancer metastatic to peritoneum and likely lung (left lower lobe lesion which appeared between January and April CT scans)  He is showing a nice response to FOLFIRI/Avastin with resolution of the right lower lobe lung lesion and regression of all of the peritoneal nodules.  I would like to give him a break from the intensive chemotherapy at this time and put him  back on a Xeloda/Avastin regimen.  Body surface area 1.9 m. I will leave him 1000 mg meter squared total dose 2 g by mouth twice a day one week on 1 week off. Continue  Avastin every 2 weeks IV.  #2. Metachronous primary bilateral non-small cell lung cancers  Initial lesion in the left upper lung treated with surgical resection and brachy therapy in June, 2007with no recurrence. Second tumor in the right lung treated with observation alone with complete regression when Avastin was started for his colon cancer.Right lower lobe lung nodule resolved on anti-colon cancer treatment and was likely a pulmonary metastasis from the colon.  .  #3. Oxaliplatin related neuropathy  Improving significantly off oxaliplatin.   #4. Type 2 diabetes   #5. History of nephrolithiasis   #6. Status post left corneal transplant.    CC: Patient Care Team: Ezequiel Kayser, MD as PCP - General (Internal Medicine)   Levert Feinstein, MD 12/15/20148:02 PM

## 2013-12-08 ENCOUNTER — Other Ambulatory Visit: Payer: Self-pay | Admitting: *Deleted

## 2013-12-08 ENCOUNTER — Encounter: Payer: Self-pay | Admitting: Oncology

## 2013-12-08 LAB — CEA: CEA: 6.5 ng/mL — ABNORMAL HIGH (ref 0.0–5.0)

## 2013-12-08 NOTE — Progress Notes (Signed)
Faxed xeloda prescription to [facility] °

## 2013-12-11 ENCOUNTER — Encounter: Payer: Self-pay | Admitting: Oncology

## 2013-12-11 NOTE — Progress Notes (Signed)
RECEIVED A FAX FROM [facility] CONCERNING A CONFIRMATION OF PRESCRIPTION SHIPMENT FOR CAPECITABINE ON 12/09/13.

## 2013-12-11 NOTE — Progress Notes (Signed)
Received approval letter from [employer].  Pt is approved for Xeloda and Avastin effective 11/04/13 to 11/03/14 or when benefit cap is met.  The amount of the grant is $[id number].  I forwarded a copy of letter to [facility] with the billing dept.

## 2013-12-16 NOTE — Progress Notes (Signed)
[facility] sent facsimile confirmation of Capecitabine prescription shipment.  Capecitabine was shipped on 12-15-2013 with next business day delivery.

## 2013-12-18 ENCOUNTER — Telehealth: Payer: Self-pay | Admitting: *Deleted

## 2013-12-18 NOTE — Telephone Encounter (Signed)
LISA THOMAS,NP CONSULTED WITH DR.SHERRILL. VERBAL ORDER AND READ BACK TO LISA THOMAS,NP- PT. SHOULD SEE PRIMARY CARE PHYSICIAN OR GO TO URGENT CARE. POSSIBLE INFLUENZA OR PNEUMONIA. NOTIFIED PT. OF THE ABOVE INSTRUCTIONS. HE VOICES UNDERSTANDING.

## 2013-12-18 NOTE — Telephone Encounter (Signed)
THERE WAS A GRANDCHILD AT THE PT.'S HOME WHO HAS BEEN DIAGNOSED WITH CROUP. PT. HAS A COUGH WITH YELLOW SPUTUM. PT. IS SLIGHTLY SHORT OF BREATH AT REST. HIS NASAL DRAINAGE IS CLEAR. PT. ALSO HAS A SLIGHT SORE THROAT. PT. HAS BEEN USING COOL COMPRESSES TO FOREHEAD, TYLENOL EXTRA STRENGTH AND ADVIL FOR CONGESTION WHICH HELP A LITTLE. PT. SOUNDS HOARSE. HE IS ON HIS REST PERIOD FOR XELODA. NEXT AVASTIN IS 12/23/13. THIS NOTE TO LISA THOMAS,NP.

## 2013-12-21 ENCOUNTER — Telehealth: Payer: Self-pay | Admitting: *Deleted

## 2013-12-21 NOTE — Telephone Encounter (Signed)
Received call from pt stating that he has appt for 12/31 for lab & rx & reports that he is recovering from the flu & will finish tamiflu tomorrow.  He also states that he started his xeloda today & all he gets is his avastin 12/31 & usually does well with that.  He reports feeling better & wants to know if he should get avastin 12/31.  Note to Dr Cyndie Chime.

## 2013-12-23 ENCOUNTER — Other Ambulatory Visit: Payer: Self-pay | Admitting: Oncology

## 2013-12-23 ENCOUNTER — Other Ambulatory Visit (HOSPITAL_BASED_OUTPATIENT_CLINIC_OR_DEPARTMENT_OTHER): Payer: Medicare Other

## 2013-12-23 ENCOUNTER — Ambulatory Visit (HOSPITAL_BASED_OUTPATIENT_CLINIC_OR_DEPARTMENT_OTHER): Payer: Medicare Other

## 2013-12-23 VITALS — BP 140/83 | HR 86 | Temp 97.4°F | Resp 18

## 2013-12-23 DIAGNOSIS — C189 Malignant neoplasm of colon, unspecified: Secondary | ICD-10-CM

## 2013-12-23 DIAGNOSIS — C185 Malignant neoplasm of splenic flexure: Secondary | ICD-10-CM

## 2013-12-23 DIAGNOSIS — C3490 Malignant neoplasm of unspecified part of unspecified bronchus or lung: Secondary | ICD-10-CM

## 2013-12-23 DIAGNOSIS — C786 Secondary malignant neoplasm of retroperitoneum and peritoneum: Secondary | ICD-10-CM

## 2013-12-23 DIAGNOSIS — Z5112 Encounter for antineoplastic immunotherapy: Secondary | ICD-10-CM

## 2013-12-23 LAB — CBC WITH DIFFERENTIAL/PLATELET
BASO%: 0.4 % (ref 0.0–2.0)
EOS%: 4.4 % (ref 0.0–7.0)
Eosinophils Absolute: 0.4 10*3/uL (ref 0.0–0.5)
HCT: 48.9 % (ref 38.4–49.9)
LYMPH%: 12 % — ABNORMAL LOW (ref 14.0–49.0)
MCH: 28.4 pg (ref 27.2–33.4)
MCHC: 32.9 g/dL (ref 32.0–36.0)
MONO#: 0.5 10*3/uL (ref 0.1–0.9)
MONO%: 6.5 % (ref 0.0–14.0)
NEUT#: 6.4 10*3/uL (ref 1.5–6.5)
NEUT%: 76.7 % — ABNORMAL HIGH (ref 39.0–75.0)
Platelets: 124 10*3/uL — ABNORMAL LOW (ref 140–400)
RBC: 5.66 10*6/uL (ref 4.20–5.82)
WBC: 8.4 10*3/uL (ref 4.0–10.3)
nRBC: 0 % (ref 0–0)

## 2013-12-23 LAB — COMPREHENSIVE METABOLIC PANEL (CC13)
ALT: 19 U/L (ref 0–55)
AST: 21 U/L (ref 5–34)
Alkaline Phosphatase: 68 U/L (ref 40–150)
BUN: 19.4 mg/dL (ref 7.0–26.0)
Calcium: 10.3 mg/dL (ref 8.4–10.4)
Chloride: 105 mEq/L (ref 98–109)
Creatinine: 1.2 mg/dL (ref 0.7–1.3)
Total Bilirubin: 0.72 mg/dL (ref 0.20–1.20)

## 2013-12-23 LAB — UA PROTEIN, DIPSTICK - CHCC: Protein, ur: 100 mg/dL

## 2013-12-23 MED ORDER — SODIUM CHLORIDE 0.9 % IV SOLN
5.0000 mg/kg | Freq: Once | INTRAVENOUS | Status: AC
  Administered 2013-12-23: 400 mg via INTRAVENOUS
  Filled 2013-12-23: qty 16

## 2013-12-23 MED ORDER — HEPARIN SOD (PORK) LOCK FLUSH 100 UNIT/ML IV SOLN
500.0000 [IU] | Freq: Once | INTRAVENOUS | Status: AC | PRN
  Administered 2013-12-23: 500 [IU]
  Filled 2013-12-23: qty 5

## 2013-12-23 MED ORDER — SODIUM CHLORIDE 0.9 % IJ SOLN
10.0000 mL | INTRAMUSCULAR | Status: DC | PRN
  Administered 2013-12-23: 10 mL
  Filled 2013-12-23: qty 10

## 2013-12-23 MED ORDER — SODIUM CHLORIDE 0.9 % IV SOLN
Freq: Once | INTRAVENOUS | Status: AC
  Administered 2013-12-23: 12:00:00 via INTRAVENOUS

## 2013-12-23 NOTE — Patient Instructions (Signed)
[facility] Discharge Instructions for Patients Receiving Chemotherapy  Today you received the following chemotherapy agents Avastin.  To help prevent nausea and vomiting after your treatment, we encourage you to take your nausea medication as prescribed.   If you develop nausea and vomiting that is not controlled by your nausea medication, call the clinic.   BELOW ARE SYMPTOMS THAT SHOULD BE REPORTED IMMEDIATELY:  *FEVER GREATER THAN 100.5 F  *CHILLS WITH OR WITHOUT FEVER  NAUSEA AND VOMITING THAT IS NOT CONTROLLED WITH YOUR NAUSEA MEDICATION  *UNUSUAL SHORTNESS OF BREATH  *UNUSUAL BRUISING OR BLEEDING  TENDERNESS IN MOUTH AND THROAT WITH OR WITHOUT PRESENCE OF ULCERS  *URINARY PROBLEMS  *BOWEL PROBLEMS  UNUSUAL RASH Items with * indicate a potential emergency and should be followed up as soon as possible.  Feel free to call the clinic you have any questions or concerns. The clinic phone number is [phone].    

## 2013-12-24 LAB — CEA: CEA: 5.6 ng/mL — ABNORMAL HIGH (ref 0.0–5.0)

## 2013-12-28 ENCOUNTER — Telehealth: Payer: Self-pay | Admitting: *Deleted

## 2013-12-28 MED ORDER — AMOXICILLIN-POT CLAVULANATE 875-125 MG PO TABS: ORAL_TABLET | ORAL | Status: DC

## 2013-12-28 NOTE — Telephone Encounter (Signed)
Pt notified to p/u script for augmentin which was sent to his pharmacy.

## 2013-12-28 NOTE — Telephone Encounter (Signed)
Received vm call from pt stating that he is to have a crown placed tomorrow & wants to know if he should have an antiobiotic-amoxicillin script.  Note to Dr Beryle Beams.

## 2013-12-30 ENCOUNTER — Telehealth: Payer: Self-pay | Admitting: *Deleted

## 2013-12-30 NOTE — Telephone Encounter (Signed)
Amy from Dr. Kathie Rhodes' office (oral surgeon) called and left message.  He is having a tooth removed and they would like medical clearance from [place] for this.  Call back is [phone].  Note to Dr. Beryle Beams.

## 2013-12-30 NOTE — Telephone Encounter (Signed)
Called Amy/Dr. Weston Settle back at [phone]  Per Dr. Beryle Beams:  He is clear for dental extraction.  CBC 12/23/13 was acceptable.  He is due for another CBC on1/14/15.  She appreciated the call back.

## 2013-12-31 ENCOUNTER — Other Ambulatory Visit: Payer: Self-pay | Admitting: *Deleted

## 2013-12-31 MED ORDER — LEVOFLOXACIN 500 MG PO TABS
ORAL_TABLET | ORAL | Status: DC
Start: 2013-12-31 — End: 2014-03-01

## 2013-12-31 NOTE — Telephone Encounter (Signed)
Received vm call from pt stating that he is probably going to have a root canal & another tooth extracted.  He took the augmentin when he thought they were going to do the crown & vomited.  He states that he tried 1/2 a pill & vomited with that too but states he's not 100% sure it is the augmentin but doesn't know what to do.  Discussed with Dr Beryle Beams & he suggested levaquin 500 mg to take day off procedure & daily x 2 more days, total of 3 doses.  Notified pt of new script.

## 2014-01-04 ENCOUNTER — Encounter: Payer: Self-pay | Admitting: *Deleted

## 2014-01-04 NOTE — Progress Notes (Signed)
RECEIVED A FAX FROM [facility] CONCERNING A CONFIRMATION OF PRESCRIPTION SHIPMENT FOR CAPECITABINE ON 01/01/14.

## 2014-01-06 ENCOUNTER — Ambulatory Visit (HOSPITAL_BASED_OUTPATIENT_CLINIC_OR_DEPARTMENT_OTHER): Payer: Medicare Other

## 2014-01-06 ENCOUNTER — Other Ambulatory Visit (HOSPITAL_BASED_OUTPATIENT_CLINIC_OR_DEPARTMENT_OTHER): Payer: Medicare Other

## 2014-01-06 VITALS — BP 120/72 | HR 91 | Temp 98.1°F

## 2014-01-06 DIAGNOSIS — C185 Malignant neoplasm of splenic flexure: Secondary | ICD-10-CM

## 2014-01-06 DIAGNOSIS — C189 Malignant neoplasm of colon, unspecified: Secondary | ICD-10-CM

## 2014-01-06 DIAGNOSIS — C3490 Malignant neoplasm of unspecified part of unspecified bronchus or lung: Secondary | ICD-10-CM

## 2014-01-06 DIAGNOSIS — C3410 Malignant neoplasm of upper lobe, unspecified bronchus or lung: Secondary | ICD-10-CM

## 2014-01-06 DIAGNOSIS — C786 Secondary malignant neoplasm of retroperitoneum and peritoneum: Secondary | ICD-10-CM

## 2014-01-06 DIAGNOSIS — Z5112 Encounter for antineoplastic immunotherapy: Secondary | ICD-10-CM

## 2014-01-06 LAB — CBC WITH DIFFERENTIAL/PLATELET
BASO%: 0.4 % (ref 0.0–2.0)
Basophils Absolute: 0 10*3/uL (ref 0.0–0.1)
EOS ABS: 0.5 10*3/uL (ref 0.0–0.5)
EOS%: 5.6 % (ref 0.0–7.0)
HCT: 49 % (ref 38.4–49.9)
HGB: 16.1 g/dL (ref 13.0–17.1)
LYMPH#: 1.2 10*3/uL (ref 0.9–3.3)
LYMPH%: 14.2 % (ref 14.0–49.0)
MCH: 29.3 pg (ref 27.2–33.4)
MCHC: 32.9 g/dL (ref 32.0–36.0)
MCV: 89.1 fL (ref 79.3–98.0)
MONO#: 0.5 10*3/uL (ref 0.1–0.9)
MONO%: 6.6 % (ref 0.0–14.0)
NEUT%: 73.2 % (ref 39.0–75.0)
NEUTROS ABS: 6 10*3/uL (ref 1.5–6.5)
Platelets: 150 10*3/uL (ref 140–400)
RBC: 5.5 10*6/uL (ref 4.20–5.82)
RDW: 20.2 % — AB (ref 11.0–14.6)
WBC: 8.2 10*3/uL (ref 4.0–10.3)

## 2014-01-06 MED ORDER — HEPARIN SOD (PORK) LOCK FLUSH 100 UNIT/ML IV SOLN
500.0000 [IU] | INTRAVENOUS | Status: AC | PRN
  Administered 2014-01-06: 500 [IU]
  Filled 2014-01-06: qty 5

## 2014-01-06 MED ORDER — SODIUM CHLORIDE 0.9 % IJ SOLN
10.0000 mL | INTRAMUSCULAR | Status: AC | PRN
  Administered 2014-01-06: 10 mL
  Filled 2014-01-06: qty 10

## 2014-01-06 MED ORDER — ALTEPLASE 2 MG IJ SOLR
2.0000 mg | Freq: Once | INTRAMUSCULAR | Status: DC | PRN
  Filled 2014-01-06: qty 2

## 2014-01-06 MED ORDER — ANTICOAGULANT SODIUM CITRATE 4% (200MG/5ML) IV SOLN
5.0000 mL | Status: DC | PRN
  Filled 2014-01-06: qty 5

## 2014-01-06 MED ORDER — SODIUM CHLORIDE 0.9 % IJ SOLN
10.0000 mL | INTRAMUSCULAR | Status: DC | PRN
  Filled 2014-01-06: qty 10

## 2014-01-06 MED ORDER — SODIUM CHLORIDE 0.9 % IV SOLN
Freq: Once | INTRAVENOUS | Status: AC
  Administered 2014-01-06: 12:00:00 via INTRAVENOUS

## 2014-01-06 MED ORDER — HEPARIN SOD (PORK) LOCK FLUSH 100 UNIT/ML IV SOLN
250.0000 [IU] | INTRAVENOUS | Status: DC | PRN
  Filled 2014-01-06: qty 5

## 2014-01-06 MED ORDER — SODIUM CHLORIDE 0.9 % IV SOLN
5.0000 mg/kg | Freq: Once | INTRAVENOUS | Status: AC
  Administered 2014-01-06: 400 mg via INTRAVENOUS
  Filled 2014-01-06: qty 16

## 2014-01-06 NOTE — Patient Instructions (Signed)
[facility] Discharge Instructions for Patients Receiving Chemotherapy  Today you received the following chemotherapy agents:  Avastin  To help prevent nausea and vomiting after your treatment, we encourage you to take your nausea medication as ordered per MD.   If you develop nausea and vomiting that is not controlled by your nausea medication, call the clinic.   BELOW ARE SYMPTOMS THAT SHOULD BE REPORTED IMMEDIATELY:  *FEVER GREATER THAN 100.5 F  *CHILLS WITH OR WITHOUT FEVER  NAUSEA AND VOMITING THAT IS NOT CONTROLLED WITH YOUR NAUSEA MEDICATION  *UNUSUAL SHORTNESS OF BREATH  *UNUSUAL BRUISING OR BLEEDING  TENDERNESS IN MOUTH AND THROAT WITH OR WITHOUT PRESENCE OF ULCERS  *URINARY PROBLEMS  *BOWEL PROBLEMS  UNUSUAL RASH Items with * indicate a potential emergency and should be followed up as soon as possible.  Feel free to call the clinic you have any questions or concerns. The clinic phone number is [phone].

## 2014-01-11 ENCOUNTER — Telehealth: Payer: Self-pay | Admitting: *Deleted

## 2014-01-11 NOTE — Telephone Encounter (Addendum)
Received call from Amy/Dr. Shelby Mattocks Surg asking for written medical clearance for pt to have # 31 tooth removed & will Dr Beryle Beams pre-med pt due to [facility]?  Ph# [phone] & fax # [phone].  Note to Dr Beryle Beams. Called Amy back after discussing with Dr Beryle Beams & pt needs cbc day or 2 before to make sure WBC OK since on chemo & pt should take augmentin same as before for crown.  Discussed with pt & he had trouble with vomiting after augmentin & was given levaquin & has refills on this.  Will discuss with pharmacy tomorrow timeframe for avastin & extraction.  Notified Amy that we would be back in touch with her tomorrow.

## 2014-01-13 ENCOUNTER — Telehealth: Payer: Self-pay | Admitting: *Deleted

## 2014-01-13 NOTE — Telephone Encounter (Signed)
Notified pt that Dr Beryle Beams gave clearance for tooth extraction & faxed clearance to Dr. Carleene Mains office.  Informed that lab needed day or 2 before to check WBC & pt should take levoquin as prescribed last.  OK even though pt has been on avastin per pharmacy/Lew/Chris.  Pt to let us know when scheduled.

## 2014-01-14 ENCOUNTER — Encounter: Payer: Self-pay | Admitting: *Deleted

## 2014-01-14 NOTE — Progress Notes (Signed)
RECEIVED A FAX FROM [facility] CONCERNING A CONFIRMATION OF PRESCRIPTION SHIPMENT FOR CAPECITABINE ON 01/13/14.

## 2014-01-15 ENCOUNTER — Ambulatory Visit (HOSPITAL_COMMUNITY): Payer: Medicare Other

## 2014-01-15 ENCOUNTER — Ambulatory Visit (HOSPITAL_COMMUNITY)
Admission: RE | Admit: 2014-01-15 | Discharge: 2014-01-15 | Disposition: A | Discharge status: Home / Self Care | Payer: Medicare Other | Source: Ambulatory Visit | Attending: Oncology | Admitting: Oncology

## 2014-01-15 DIAGNOSIS — C3490 Malignant neoplasm of unspecified part of unspecified bronchus or lung: Secondary | ICD-10-CM

## 2014-01-15 DIAGNOSIS — K5710 Diverticulosis of small intestine without perforation or abscess without bleeding: Secondary | ICD-10-CM | POA: Insufficient documentation

## 2014-01-15 DIAGNOSIS — C189 Malignant neoplasm of colon, unspecified: Secondary | ICD-10-CM | POA: Insufficient documentation

## 2014-01-15 DIAGNOSIS — K669 Disorder of peritoneum, unspecified: Secondary | ICD-10-CM | POA: Insufficient documentation

## 2014-01-15 MED ORDER — IOHEXOL 300 MG/ML  SOLN
100.0000 mL | Freq: Once | INTRAMUSCULAR | Status: AC | PRN
  Administered 2014-01-15: 100 mL via INTRAVENOUS

## 2014-01-18 ENCOUNTER — Other Ambulatory Visit (HOSPITAL_BASED_OUTPATIENT_CLINIC_OR_DEPARTMENT_OTHER): Payer: Medicare Other

## 2014-01-18 ENCOUNTER — Telehealth: Payer: Self-pay | Admitting: *Deleted

## 2014-01-18 ENCOUNTER — Telehealth: Payer: Self-pay | Admitting: Oncology

## 2014-01-18 DIAGNOSIS — C3490 Malignant neoplasm of unspecified part of unspecified bronchus or lung: Secondary | ICD-10-CM

## 2014-01-18 DIAGNOSIS — C185 Malignant neoplasm of splenic flexure: Secondary | ICD-10-CM

## 2014-01-18 DIAGNOSIS — C189 Malignant neoplasm of colon, unspecified: Secondary | ICD-10-CM

## 2014-01-18 LAB — CBC WITH DIFFERENTIAL/PLATELET
BASO%: 1.1 % (ref 0.0–2.0)
Basophils Absolute: 0.1 10*3/uL (ref 0.0–0.1)
EOS ABS: 0.9 10*3/uL — AB (ref 0.0–0.5)
EOS%: 10 % — ABNORMAL HIGH (ref 0.0–7.0)
HCT: 50.9 % — ABNORMAL HIGH (ref 38.4–49.9)
HGB: 16.8 g/dL (ref 13.0–17.1)
LYMPH%: 14.5 % (ref 14.0–49.0)
MCH: 29.5 pg (ref 27.2–33.4)
MCHC: 32.9 g/dL (ref 32.0–36.0)
MCV: 89.7 fL (ref 79.3–98.0)
MONO#: 0.6 10*3/uL (ref 0.1–0.9)
MONO%: 7.4 % (ref 0.0–14.0)
NEUT%: 67 % (ref 39.0–75.0)
NEUTROS ABS: 5.8 10*3/uL (ref 1.5–6.5)
Platelets: 153 10*3/uL (ref 140–400)
RBC: 5.68 10*6/uL (ref 4.20–5.82)
RDW: 23.2 % — AB (ref 11.0–14.6)
WBC: 8.6 10*3/uL (ref 4.0–10.3)
lymph#: 1.2 10*3/uL (ref 0.9–3.3)

## 2014-01-18 LAB — COMPREHENSIVE METABOLIC PANEL (CC13)
ALBUMIN: 3.8 g/dL (ref 3.5–5.0)
ALT: 16 U/L (ref 0–55)
ANION GAP: 10 meq/L (ref 3–11)
AST: 21 U/L (ref 5–34)
Alkaline Phosphatase: 64 U/L (ref 40–150)
BUN: 19.7 mg/dL (ref 7.0–26.0)
CALCIUM: 10.3 mg/dL (ref 8.4–10.4)
CHLORIDE: 106 meq/L (ref 98–109)
CO2: 25 meq/L (ref 22–29)
Creatinine: 1.2 mg/dL (ref 0.7–1.3)
GLUCOSE: 130 mg/dL (ref 70–140)
POTASSIUM: 4.2 meq/L (ref 3.5–5.1)
Sodium: 141 mEq/L (ref 136–145)
TOTAL PROTEIN: 6.5 g/dL (ref 6.4–8.3)
Total Bilirubin: 1.58 mg/dL — ABNORMAL HIGH (ref 0.20–1.20)

## 2014-01-18 LAB — LACTATE DEHYDROGENASE (CC13): LDH: 237 U/L (ref 125–245)

## 2014-01-18 NOTE — Telephone Encounter (Signed)
Message copied by Jesse Fall on Mon Jan 18, 2014  4:50 PM ------      Message from: Annia Belt      Created: Fri Jan 15, 2014  6:54 PM       Call pt: 2 tiny abdominal nodules unchanged from previous scan.  No new or progressive colon cancer ------

## 2014-01-18 NOTE — Telephone Encounter (Signed)
Gave pt appt for lab today r/s fron 01/20/14

## 2014-01-18 NOTE — Telephone Encounter (Signed)
Notified pt of scan report per Dr. Azucena Freed instructions.  Also faxed copy of lab/cbc report to Dr. Terence Lux DDS @ [phone] for possible tooth extraction tomorrow.  Pt notified of this. Pt wanted to come in earlier than wed for lab due to lab needed before extraction.

## 2014-01-19 ENCOUNTER — Telehealth: Payer: Self-pay | Admitting: *Deleted

## 2014-01-19 LAB — CEA: CEA: 10.1 ng/mL — AB (ref 0.0–5.0)

## 2014-01-19 NOTE — Telephone Encounter (Signed)
Notified Hannan Tetzlaff at Dr. Carleene Mains office that fax was sent per request for written authorization from Dr. Beryle Beams.  Confirmed Fax # [phone].  [name] verbalized understanding.

## 2014-01-19 NOTE — Telephone Encounter (Signed)
Received message from Saraann Enneking--Dr. Loran Senters office stating that Dr. Weston Settle is requesting written authorization for pt to have 2 teeth extracted from Dr. Beryle Beams;  also confirmed that their office did receive pt's labs yesterday 01/18/14.  Note to Dr. Beryle Beams.

## 2014-01-20 ENCOUNTER — Ambulatory Visit (HOSPITAL_BASED_OUTPATIENT_CLINIC_OR_DEPARTMENT_OTHER): Payer: Medicare Other | Admitting: Nurse Practitioner

## 2014-01-20 ENCOUNTER — Ambulatory Visit: Payer: Medicare Other

## 2014-01-20 ENCOUNTER — Other Ambulatory Visit: Payer: Medicare Other

## 2014-01-20 VITALS — BP 133/73 | HR 84 | Resp 18 | Ht 67.0 in | Wt 169.2 lb

## 2014-01-20 DIAGNOSIS — C189 Malignant neoplasm of colon, unspecified: Secondary | ICD-10-CM

## 2014-01-20 DIAGNOSIS — C3490 Malignant neoplasm of unspecified part of unspecified bronchus or lung: Secondary | ICD-10-CM

## 2014-01-20 DIAGNOSIS — C185 Malignant neoplasm of splenic flexure: Secondary | ICD-10-CM

## 2014-01-20 DIAGNOSIS — C3410 Malignant neoplasm of upper lobe, unspecified bronchus or lung: Secondary | ICD-10-CM

## 2014-01-20 DIAGNOSIS — C786 Secondary malignant neoplasm of retroperitoneum and peritoneum: Secondary | ICD-10-CM

## 2014-01-20 DIAGNOSIS — G620 Drug-induced polyneuropathy: Secondary | ICD-10-CM

## 2014-01-20 MED ORDER — HYDROCODONE-ACETAMINOPHEN 5-325 MG PO TABS: ORAL_TABLET | ORAL | Status: DC

## 2014-01-20 NOTE — Progress Notes (Signed)
OFFICE PROGRESS NOTE  Interval history:  [name] is a 73 year old man with metastatic colon cancer currently on Xeloda at 1 week on/1 week off and Avastin every 2 weeks. Restaging CT chest/abdomen 01/15/2014 showed no evidence of lung cancer recurrent or colon cancer progression. Small peritoneal nodules in the left upper quadrant were stable.  He has had a few episodes of nausea. Energy level is better. He reports a good appetite. He is gaining weight. He denies mouth sores. He notes an "aching" discomfort in his mouth when on this regimen. He has previously taken hydrocodone and would like a new prescription. No diarrhea. He denies abdominal pain. No shortness of breath or chest pain. No leg swelling or calf pain. No hand or foot pain or redness. He reports undergoing extraction of 2 teeth yesterday. He had bleeding for about 12 hours post procedure.   Objective: Filed Vitals:   01/20/14 1231  BP: 133/73  Pulse: 84  Resp: 18   Tooth extraction sites without evidence of bleeding. No thrush or ulcerations. Lungs are clear. Regular cardiac rhythm. Port-A-Cath site without erythema. Abdomen soft and nontender. No hepatomegaly. No leg edema. Calves soft and nontender. Motor strength 5 over 5. Palms nontender. No erythema or skin breakdown.  Lab Results: Lab Results  Component Value Date   WBC 8.6 01/18/2014   HGB 16.8 01/18/2014   HCT 50.9* 01/18/2014   MCV 89.7 01/18/2014   PLT 153 01/18/2014   NEUTROABS 5.8 01/18/2014    Chemistry:    Chemistry      Component Value Date/Time   NA 141 01/18/2014 1155   NA 138 04/27/2013 1300   NA 139 10/15/2011 1601   K 4.2 01/18/2014 1155   K 4.2 04/27/2013 1300   K 4.3 10/15/2011 1601   CL 108* 06/17/2013 1200   CL 104 04/27/2013 1300   CL 101 10/15/2011 1601   CO2 25 01/18/2014 1155   CO2 24 04/27/2013 1300   CO2 30 10/15/2011 1601   BUN 19.7 01/18/2014 1155   BUN 13 04/27/2013 1300   BUN 17 10/15/2011 1601   CREATININE 1.2 01/18/2014 1155   CREATININE  1.15 04/27/2013 1300   CREATININE 1.1 10/15/2011 1601      Component Value Date/Time   CALCIUM 10.3 01/18/2014 1155   CALCIUM 9.9 04/27/2013 1300   CALCIUM 9.3 10/15/2011 1601   ALKPHOS 64 01/18/2014 1155   ALKPHOS 50 12/31/2012 1632   ALKPHOS 59 10/15/2011 1601   AST 21 01/18/2014 1155   AST 19 12/31/2012 1632   AST 35 10/15/2011 1601   ALT 16 01/18/2014 1155   ALT 14 12/31/2012 1632   ALT 37 10/15/2011 1601   BILITOT 1.58* 01/18/2014 1155   BILITOT 0.6 12/31/2012 1632   BILITOT 1.30 10/15/2011 1601     01/18/2014 CEA 10.1.  Studies/Results: Ct Chest W Contrast  01/15/2014   CLINICAL DATA:  Lung cancer and colon cancer.  EXAM: CT CHEST, ABDOMEN, WITH CONTRAST  TECHNIQUE: Multidetector CT imaging of the chest, abdomen was performed following the standard protocol during bolus administration of intravenous contrast.  CONTRAST:  134mL OMNIPAQUE IOHEXOL 300 MG/ML  SOLN  COMPARISON:  None.  FINDINGS: CT CHEST FINDINGS  There is a port in the right anterior chest wall. No axillary or supraclavicular lymphadenopathy. No mediastinal hilar lymphadenopathy. No pericardial fluid. Esophagus is normal. There is postoperative change in the left upper lobe with scarring and brachytherapy seed implants. There is no focal nodularity. Right lung is clear. Centrilobular emphysema  is noted at lung apices.  CT ABDOMEN FINDINGS  Stable small 7 mm hypervascular lesion the right hepatic lobe (image 55). Gallbladder, pancreas, spleen, adrenal glands are unchanged. There is a periampullary duodenum diverticulum.  The stomach and limited view of small bowel and colon are unremarkable. No retroperitoneal periportal lymphadenopathy. No aggressive osseous lesion. No peritoneal disease evident in the upper abdomen.  There is a small nodule within the left upper peritoneal space measuring 7 mm (image 68, series 2) superior similar prior. Second nodule along the splenic flexure measuring 14 mm is unchanged from 14 mm on CT of 01/07/2013  (image 60, series 2).  IMPRESSION: 1. No evidence of lung cancer recurrence. 2. Post therapy changes in the left upper lobe. 3. No evidence of colon cancer progression. 4. Small peritoneal nodule in the left upper quadrant are stable. Recommend attention on follow-up.   Electronically Signed   By: Suzy Bouchard M.D.   On: 01/15/2014 17:45   Ct Abdomen W Contrast  01/15/2014   CLINICAL DATA:  Lung cancer and colon cancer.  EXAM: CT CHEST, ABDOMEN, WITH CONTRAST  TECHNIQUE: Multidetector CT imaging of the chest, abdomen was performed following the standard protocol during bolus administration of intravenous contrast.  CONTRAST:  139mL OMNIPAQUE IOHEXOL 300 MG/ML  SOLN  COMPARISON:  None.  FINDINGS: CT CHEST FINDINGS  There is a port in the right anterior chest wall. No axillary or supraclavicular lymphadenopathy. No mediastinal hilar lymphadenopathy. No pericardial fluid. Esophagus is normal. There is postoperative change in the left upper lobe with scarring and brachytherapy seed implants. There is no focal nodularity. Right lung is clear. Centrilobular emphysema is noted at lung apices.  CT ABDOMEN FINDINGS  Stable small 7 mm hypervascular lesion the right hepatic lobe (image 55). Gallbladder, pancreas, spleen, adrenal glands are unchanged. There is a periampullary duodenum diverticulum.  The stomach and limited view of small bowel and colon are unremarkable. No retroperitoneal periportal lymphadenopathy. No aggressive osseous lesion. No peritoneal disease evident in the upper abdomen.  There is a small nodule within the left upper peritoneal space measuring 7 mm (image 68, series 2) superior similar prior. Second nodule along the splenic flexure measuring 14 mm is unchanged from 14 mm on CT of 01/07/2013 (image 60, series 2).  IMPRESSION: 1. No evidence of lung cancer recurrence. 2. Post therapy changes in the left upper lobe. 3. No evidence of colon cancer progression. 4. Small peritoneal nodule in the left  upper quadrant are stable. Recommend attention on follow-up.   Electronically Signed   By: Suzy Bouchard M.D.   On: 01/15/2014 17:44    Medications: I have reviewed the patient's current medications.  Assessment/Plan: 1. 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 He began treatment on the FOLFIRI regimen on 04/29/2013. Avastin was added beginning with cycle 3. Restaging CT evaluation after cycle 6 showed improvement. He was last treated with FOLFIRI/Avastin 11/23/2013. Xeloda/Avastin resumed mid December 2014. 2. Status post Port-A-Cath placement 04/27/2013. 3. Status post resection of a stage I non-small cell lung cancer left upper lung with postop brachytherapy June 2007. 4. 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 Radiographic complete regression on Avastin based chemotherapy. 5. Oxaliplatin neuropathy.  6. Type 2 diabetes. 7. History of nephrolithiasis. 8. Status post bilateral corneal transplants. 9. Status post extraction of 2 teeth 01/19/2014.   Dispositon-he appears stable. Recent CEA was increased.  Restaging CT evaluation showed no evidence of disease progression. We will obtain a followup CEA at a 4 week interval. Plan to continue Xeloda/Avastin for now but will hold today's planned dose of Avastin due to the recent tooth extractions. He will return in 2 weeks to resume Avastin. He has a followup visit with Dr. Beryle Beams on 03/01/2014. He will contact the office in the interim with any problems.  Plan reviewed with Dr. Beryle Beams.   Ned Card ANP/GNP-BC

## 2014-01-21 ENCOUNTER — Telehealth: Payer: Self-pay | Admitting: *Deleted

## 2014-01-21 ENCOUNTER — Other Ambulatory Visit (HOSPITAL_COMMUNITY)
Admission: RE | Admit: 2014-01-21 | Discharge: 2014-01-21 | Disposition: A | Discharge status: Home / Self Care | Payer: Medicare Other | Source: Ambulatory Visit | Attending: Oncology | Admitting: Oncology

## 2014-01-21 DIAGNOSIS — C189 Malignant neoplasm of colon, unspecified: Secondary | ICD-10-CM | POA: Insufficient documentation

## 2014-01-21 NOTE — Telephone Encounter (Signed)
Request for Extended KRAS testing on Accession #: [id number] submitted per order from L. Thomas ANP/Dr. Beryle Beams.  Spoke with J. Epps in pathology.

## 2014-01-28 ENCOUNTER — Encounter: Payer: Self-pay | Admitting: *Deleted

## 2014-01-28 NOTE — Progress Notes (Signed)
RECEIVED A FAX FROM [facility] CONCERNING A CONFIRMATION OF PRESCRIPTION SHIPMENT FOR CAPECITABINE ON 01/27/14.

## 2014-02-03 ENCOUNTER — Ambulatory Visit (HOSPITAL_BASED_OUTPATIENT_CLINIC_OR_DEPARTMENT_OTHER): Payer: Medicare Other

## 2014-02-03 ENCOUNTER — Other Ambulatory Visit (HOSPITAL_BASED_OUTPATIENT_CLINIC_OR_DEPARTMENT_OTHER): Payer: Medicare Other

## 2014-02-03 VITALS — BP 149/68 | HR 73 | Temp 97.4°F | Resp 18

## 2014-02-03 DIAGNOSIS — C185 Malignant neoplasm of splenic flexure: Secondary | ICD-10-CM

## 2014-02-03 DIAGNOSIS — Z5112 Encounter for antineoplastic immunotherapy: Secondary | ICD-10-CM

## 2014-02-03 DIAGNOSIS — C786 Secondary malignant neoplasm of retroperitoneum and peritoneum: Secondary | ICD-10-CM

## 2014-02-03 DIAGNOSIS — C3490 Malignant neoplasm of unspecified part of unspecified bronchus or lung: Secondary | ICD-10-CM

## 2014-02-03 DIAGNOSIS — C189 Malignant neoplasm of colon, unspecified: Secondary | ICD-10-CM

## 2014-02-03 LAB — CBC WITH DIFFERENTIAL/PLATELET
BASO%: 0.2 % (ref 0.0–2.0)
BASOS ABS: 0 10*3/uL (ref 0.0–0.1)
EOS%: 5.1 % (ref 0.0–7.0)
Eosinophils Absolute: 0.4 10*3/uL (ref 0.0–0.5)
HCT: 43.7 % (ref 38.4–49.9)
HGB: 14.4 g/dL (ref 13.0–17.1)
LYMPH%: 13.7 % — AB (ref 14.0–49.0)
MCH: 29.8 pg (ref 27.2–33.4)
MCHC: 33 g/dL (ref 32.0–36.0)
MCV: 90.5 fL (ref 79.3–98.0)
MONO#: 0.7 10*3/uL (ref 0.1–0.9)
MONO%: 8.4 % (ref 0.0–14.0)
NEUT%: 72.6 % (ref 39.0–75.0)
NEUTROS ABS: 5.9 10*3/uL (ref 1.5–6.5)
PLATELETS: 128 10*3/uL — AB (ref 140–400)
RBC: 4.83 10*6/uL (ref 4.20–5.82)
RDW: 20.9 % — ABNORMAL HIGH (ref 11.0–14.6)
WBC: 8.2 10*3/uL (ref 4.0–10.3)
lymph#: 1.1 10*3/uL (ref 0.9–3.3)

## 2014-02-03 LAB — COMPREHENSIVE METABOLIC PANEL (CC13)
ALBUMIN: 3.2 g/dL — AB (ref 3.5–5.0)
ALT: 10 U/L (ref 0–55)
ANION GAP: 7 meq/L (ref 3–11)
AST: 17 U/L (ref 5–34)
Alkaline Phosphatase: 57 U/L (ref 40–150)
BUN: 11.3 mg/dL (ref 7.0–26.0)
CO2: 25 meq/L (ref 22–29)
CREATININE: 1.1 mg/dL (ref 0.7–1.3)
Calcium: 9.5 mg/dL (ref 8.4–10.4)
Chloride: 109 mEq/L (ref 98–109)
GLUCOSE: 116 mg/dL (ref 70–140)
Potassium: 3.8 mEq/L (ref 3.5–5.1)
Sodium: 140 mEq/L (ref 136–145)
Total Bilirubin: 0.88 mg/dL (ref 0.20–1.20)
Total Protein: 5.6 g/dL — ABNORMAL LOW (ref 6.4–8.3)

## 2014-02-03 LAB — UA PROTEIN, DIPSTICK - CHCC: Protein, ur: 30 mg/dL

## 2014-02-03 MED ORDER — SODIUM CHLORIDE 0.9 % IJ SOLN
10.0000 mL | INTRAMUSCULAR | Status: DC | PRN
  Administered 2014-02-03: 10 mL
  Filled 2014-02-03: qty 10

## 2014-02-03 MED ORDER — SODIUM CHLORIDE 0.9 % IV SOLN
5.0000 mg/kg | Freq: Once | INTRAVENOUS | Status: AC
  Administered 2014-02-03: 400 mg via INTRAVENOUS
  Filled 2014-02-03: qty 16

## 2014-02-03 MED ORDER — SODIUM CHLORIDE 0.9 % IV SOLN
Freq: Once | INTRAVENOUS | Status: AC
  Administered 2014-02-03: 13:00:00 via INTRAVENOUS

## 2014-02-03 MED ORDER — HEPARIN SOD (PORK) LOCK FLUSH 100 UNIT/ML IV SOLN
500.0000 [IU] | Freq: Once | INTRAVENOUS | Status: AC | PRN
  Administered 2014-02-03: 500 [IU]
  Filled 2014-02-03: qty 5

## 2014-02-03 NOTE — Progress Notes (Signed)
Urine Protein 30

## 2014-02-03 NOTE — Patient Instructions (Signed)
[facility] Discharge Instructions for Patients Receiving Chemotherapy  Today you received the following chemotherapy agents: avastin   To help prevent nausea and vomiting after your treatment, we encourage you to take your nausea medication.  Take it as often as prescribed.     If you develop nausea and vomiting that is not controlled by your nausea medication, call the clinic. If it is after clinic hours your family physician or the after hours number for the clinic or go to the Emergency Department.   BELOW ARE SYMPTOMS THAT SHOULD BE REPORTED IMMEDIATELY:  *FEVER GREATER THAN 100.5 F  *CHILLS WITH OR WITHOUT FEVER  NAUSEA AND VOMITING THAT IS NOT CONTROLLED WITH YOUR NAUSEA MEDICATION  *UNUSUAL SHORTNESS OF BREATH  *UNUSUAL BRUISING OR BLEEDING  TENDERNESS IN MOUTH AND THROAT WITH OR WITHOUT PRESENCE OF ULCERS  *URINARY PROBLEMS  *BOWEL PROBLEMS  UNUSUAL RASH Items with * indicate a potential emergency and should be followed up as soon as possible.  Feel free to call the clinic you have any questions or concerns. The clinic phone number is [phone].   I have been informed and understand all the instructions given to me. I know to contact the clinic, my physician, or go to the Emergency Department if any problems should occur. I do not have any questions at this time, but understand that I may call the clinic during office hours   should I have any questions or need assistance in obtaining follow up care.    __________________________________________  _____________  __________ Signature of Patient or Authorized Representative            Date                   Time    __________________________________________ Nurse's Signature

## 2014-02-04 LAB — CEA: CEA: 10.4 ng/mL — ABNORMAL HIGH (ref 0.0–5.0)

## 2014-02-08 ENCOUNTER — Other Ambulatory Visit: Payer: Self-pay | Admitting: *Deleted

## 2014-02-08 NOTE — Telephone Encounter (Signed)
THIS REFILL REQUEST FOR CAPECITABINE WAS PLACED IN DR.GRANFORTUNA'S ACTIVE WORK BOX.

## 2014-02-09 ENCOUNTER — Other Ambulatory Visit: Payer: Self-pay | Admitting: *Deleted

## 2014-02-09 DIAGNOSIS — C3490 Malignant neoplasm of unspecified part of unspecified bronchus or lung: Secondary | ICD-10-CM

## 2014-02-09 MED ORDER — CAPECITABINE 500 MG PO TABS: 2000.0000 mg | ORAL_TABLET | Freq: Two times a day (BID) | ORAL | Status: DC

## 2014-02-15 NOTE — Telephone Encounter (Signed)
RECEIVED A FAX FROM [facility] CONCERNING A CONFIRMATION OF PRESCRIPTION SHIPMENT FOR CAPECITABINE ON 02/12/14.

## 2014-02-17 ENCOUNTER — Ambulatory Visit (HOSPITAL_BASED_OUTPATIENT_CLINIC_OR_DEPARTMENT_OTHER): Payer: Medicare Other

## 2014-02-17 ENCOUNTER — Other Ambulatory Visit (HOSPITAL_BASED_OUTPATIENT_CLINIC_OR_DEPARTMENT_OTHER): Payer: Medicare Other

## 2014-02-17 VITALS — BP 136/74 | HR 94 | Temp 96.9°F | Resp 20

## 2014-02-17 DIAGNOSIS — C189 Malignant neoplasm of colon, unspecified: Secondary | ICD-10-CM

## 2014-02-17 DIAGNOSIS — C185 Malignant neoplasm of splenic flexure: Secondary | ICD-10-CM

## 2014-02-17 DIAGNOSIS — Z5112 Encounter for antineoplastic immunotherapy: Secondary | ICD-10-CM

## 2014-02-17 DIAGNOSIS — C786 Secondary malignant neoplasm of retroperitoneum and peritoneum: Secondary | ICD-10-CM

## 2014-02-17 LAB — CBC WITH DIFFERENTIAL/PLATELET
BASO%: 0.4 % (ref 0.0–2.0)
Basophils Absolute: 0 10*3/uL (ref 0.0–0.1)
EOS%: 5 % (ref 0.0–7.0)
Eosinophils Absolute: 0.4 10*3/uL (ref 0.0–0.5)
HCT: 46.7 % (ref 38.4–49.9)
HEMOGLOBIN: 16 g/dL (ref 13.0–17.1)
LYMPH%: 13.3 % — AB (ref 14.0–49.0)
MCH: 31.1 pg (ref 27.2–33.4)
MCHC: 34.3 g/dL (ref 32.0–36.0)
MCV: 90.9 fL (ref 79.3–98.0)
MONO#: 0.6 10*3/uL (ref 0.1–0.9)
MONO%: 7.1 % (ref 0.0–14.0)
NEUT#: 6.3 10*3/uL (ref 1.5–6.5)
NEUT%: 74.2 % (ref 39.0–75.0)
PLATELETS: 133 10*3/uL — AB (ref 140–400)
RBC: 5.14 10*6/uL (ref 4.20–5.82)
RDW: 21 % — ABNORMAL HIGH (ref 11.0–14.6)
WBC: 8.5 10*3/uL (ref 4.0–10.3)
lymph#: 1.1 10*3/uL (ref 0.9–3.3)
nRBC: 0 % (ref 0–0)

## 2014-02-17 LAB — UA PROTEIN, DIPSTICK - CHCC: Protein, ur: 300 mg/dL

## 2014-02-17 MED ORDER — SODIUM CHLORIDE 0.9 % IJ SOLN
10.0000 mL | INTRAMUSCULAR | Status: DC | PRN
  Administered 2014-02-17: 10 mL
  Filled 2014-02-17: qty 10

## 2014-02-17 MED ORDER — HEPARIN SOD (PORK) LOCK FLUSH 100 UNIT/ML IV SOLN
500.0000 [IU] | Freq: Once | INTRAVENOUS | Status: AC | PRN
  Administered 2014-02-17: 500 [IU]
  Filled 2014-02-17: qty 5

## 2014-02-17 MED ORDER — SODIUM CHLORIDE 0.9 % IV SOLN
5.0000 mg/kg | Freq: Once | INTRAVENOUS | Status: AC
  Administered 2014-02-17: 400 mg via INTRAVENOUS
  Filled 2014-02-17: qty 16

## 2014-02-17 MED ORDER — SODIUM CHLORIDE 0.9 % IV SOLN
Freq: Once | INTRAVENOUS | Status: AC
  Administered 2014-02-17: 13:00:00 via INTRAVENOUS

## 2014-02-17 NOTE — Patient Instructions (Signed)
[place] [facility] Discharge Instructions for Patients Receiving Chemotherapy  Today you received the following chemotherapy agents AVASTIN   To help prevent nausea and vomiting after your treatment, we encourage you to take your nausea medication IF NEEDED   If you develop nausea and vomiting that is not controlled by your nausea medication, call the clinic.   BELOW ARE SYMPTOMS THAT SHOULD BE REPORTED IMMEDIATELY:  *FEVER GREATER THAN 100.5 F  *CHILLS WITH OR WITHOUT FEVER  NAUSEA AND VOMITING THAT IS NOT CONTROLLED WITH YOUR NAUSEA MEDICATION  *UNUSUAL SHORTNESS OF BREATH  *UNUSUAL BRUISING OR BLEEDING  TENDERNESS IN MOUTH AND THROAT WITH OR WITHOUT PRESENCE OF ULCERS  *URINARY PROBLEMS  *BOWEL PROBLEMS  UNUSUAL RASH Items with * indicate a potential emergency and should be followed up as soon as possible.  Feel free to call the clinic you have any questions or concerns. The clinic phone number is [phone].

## 2014-02-17 NOTE — Progress Notes (Signed)
OK to treat with urine protein of 300 per Dr Beryle Beams

## 2014-02-20 ENCOUNTER — Encounter: Payer: Self-pay | Admitting: Oncology

## 2014-03-01 ENCOUNTER — Ambulatory Visit (HOSPITAL_BASED_OUTPATIENT_CLINIC_OR_DEPARTMENT_OTHER): Payer: Medicare Other | Admitting: Oncology

## 2014-03-01 ENCOUNTER — Telehealth: Payer: Self-pay | Admitting: Oncology

## 2014-03-01 ENCOUNTER — Other Ambulatory Visit (HOSPITAL_BASED_OUTPATIENT_CLINIC_OR_DEPARTMENT_OTHER): Payer: Medicare Other

## 2014-03-01 VITALS — BP 120/66 | HR 80 | Temp 97.5°F | Resp 18 | Ht 67.0 in | Wt 172.9 lb

## 2014-03-01 DIAGNOSIS — C189 Malignant neoplasm of colon, unspecified: Secondary | ICD-10-CM

## 2014-03-01 DIAGNOSIS — C185 Malignant neoplasm of splenic flexure: Secondary | ICD-10-CM

## 2014-03-01 DIAGNOSIS — G620 Drug-induced polyneuropathy: Secondary | ICD-10-CM

## 2014-03-01 DIAGNOSIS — E119 Type 2 diabetes mellitus without complications: Secondary | ICD-10-CM

## 2014-03-01 DIAGNOSIS — C786 Secondary malignant neoplasm of retroperitoneum and peritoneum: Secondary | ICD-10-CM

## 2014-03-01 LAB — CBC WITH DIFFERENTIAL/PLATELET
BASO%: 0.5 % (ref 0.0–2.0)
BASOS ABS: 0 10*3/uL (ref 0.0–0.1)
EOS%: 5.9 % (ref 0.0–7.0)
Eosinophils Absolute: 0.6 10*3/uL — ABNORMAL HIGH (ref 0.0–0.5)
HEMATOCRIT: 48.8 % (ref 38.4–49.9)
HGB: 16.1 g/dL (ref 13.0–17.1)
LYMPH%: 14.4 % (ref 14.0–49.0)
MCH: 31.1 pg (ref 27.2–33.4)
MCHC: 33 g/dL (ref 32.0–36.0)
MCV: 94.1 fL (ref 79.3–98.0)
MONO#: 0.9 10*3/uL (ref 0.1–0.9)
MONO%: 9 % (ref 0.0–14.0)
NEUT#: 6.8 10*3/uL — ABNORMAL HIGH (ref 1.5–6.5)
NEUT%: 70.2 % (ref 39.0–75.0)
PLATELETS: 159 10*3/uL (ref 140–400)
RBC: 5.19 10*6/uL (ref 4.20–5.82)
RDW: 23.3 % — ABNORMAL HIGH (ref 11.0–14.6)
WBC: 9.7 10*3/uL (ref 4.0–10.3)
lymph#: 1.4 10*3/uL (ref 0.9–3.3)

## 2014-03-01 LAB — UA PROTEIN, DIPSTICK - CHCC: Protein, ur: 100 mg/dL

## 2014-03-01 NOTE — Progress Notes (Signed)
Hematology and Oncology Follow Up Visit  [name] [id number] 24-Jan-1941 73 y.o. 03/01/2014 3:59 PM   Principle Diagnosis: Encounter Diagnosis  Name Primary?  . 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 He developed a rise in CEA tumor marker and 2 intra-abdominal soft tissue densities on followup CT scan done in November 2012 while on adjuvant Xeloda chemotherapy. Oxaliplatin and Avastin were added to his regimen. Oxaliplatinum was started 11/26/2011. He had a steady fall in his CEA tumor marker from peak value of 12.6 on 11/02/2011 to 2.3 by July 10. Unfortunately, he developed cumulative neurotoxicity from the Oxaliplatinum. We were following his symptoms closely but he had a sudden worsening of neuropathy, hands greater than feet, and was having trouble buttoning buttons and uncomfortable dysesthesias. I had to stop the Oxaliplatinum. He continued on Xeloda one week on 1 week off and every two-week Avastin until April 2014 when CEA began to slowly rise again to a peak of 17.7 by May 7. Followup CT scans done April 16 showed a new, indeterminate nodule at the left lung base 1.8 cm. There was an increase in the size and number of multiple peritoneal nodules largest up to 1.5 cm and a 2.1 cm soft tissue implant along the surface of the gastric antrum. I reviewed his chemotherapy plan. He was not exposed to Irinotecan. Beginning on May 7, I started him on the FOLFIRI regimen. I had to make a 25% dose reduction after the first treatment due to significant mucositis.  He  had a nice response to this regimen with fall in his CEA tumor marker down to 4.2 by September 3. Minimal change since that time with current value of 5.3 on 11/03/2013. There was  complete regression of soft  tissue abnormalities in the abdomen and pelvis  on CT scans 10/19/2013. He. As an unexpected positive event, right lower lobe pulmonary nodule has also completely regressed.   At time of visit here on 12/07/2013, it was clear that the chemotherapy toxicity was starting to build up and the impact negatively on his quality of life. He lost a significant amount of weight. He was having intermittent diarrhea. Progressive weakness. He was on the FOLFIRI/Avastin for 7 months. I elected to modify his regimen again and drop the irinotecan/5-FU intravenous and put him back on a combination of Xeloda plus Avastin.  His performance status has significantly improved. Weight up from 169 pounds to 173 pounds. No significant diarrhea. No hand foot syndrome. CT scans done 01/15/2014 remained stable. CEA tumor marker has risen again from 5.6 on December 31 up to 10.4 on 02/03/2014 but this value is stable compared with a 01/18/2014 value.      Medications: reviewed  Allergies: No Known Allergies  Review of Systems: Hematology:  No bleeding or bruising ENT ROS: No sore throat Breast ROS:  Respiratory ROS: No cough or dyspnea Cardiovascular ROS:  No chest pain or palpitations Gastrointestinal ROS: No abdominal pain or change in bowel habit   Genito-Urinary ROS: No urinary tract symptoms Musculoskeletal ROS: See above Neurological ROS: No headache or change in vision Dermatological ROS: No rash or ecchymosis Remaining ROS negative:   Physical Exam: Blood pressure 120/66, pulse 80, temperature 97.5 F (36.4 C), temperature source Oral, resp. rate 18, height 5' 7" (1.702 m), weight  172 lb 14.4 oz (78.427 kg), SpO2 95.00%. Wt Readings from Last 3 Encounters:  03/01/14 172 lb 14.4 oz (78.427 kg)  01/20/14 169 lb 3.2 oz (76.749 kg)  12/07/13 176 lb 8 oz (80.06 kg)     General appearance: Adequately nourished Caucasian man HENNT: Status bilateral corneal transplants. 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 Pharynx no erythema, exudate, mass, or ulcer. No thyromegaly or thyroid nodules Lymph nodes: No cervical, supraclavicular, or axillary lymphadenopathy Breasts:  Lungs: Clear to auscultation, resonant to percussion throughout Heart: Regular rhythm, no murmur, no gallop, no rub, no click, no edema Abdomen: Soft, nontender, normal bowel sounds, no mass, no organomegaly Extremities: No edema, no calf tenderness Musculoskeletal: no joint deformities GU:  Vascular: Carotid pulses 2+, no bruits,  Neurologic: Alert, oriented, PERRLA,  , cranial nerves grossly normal, motor strength 5 over 5, reflexes 1+ symmetric, upper body coordination normal, gait normal, Skin: No rash or ecchymosis  Lab Results: CBC W/Diff    Component Value Date/Time   WBC 9.7 03/01/2014 0957   WBC 10.2 04/27/2013 1300   RBC 5.19 03/01/2014 0957   RBC 5.31 04/27/2013 1300   HGB 16.1 03/01/2014 0957   HGB 17.1* 04/27/2013 1300   HCT 48.8 03/01/2014 0957   HCT 48.5 04/27/2013 1300   PLT 159 03/01/2014 0957   PLT 134* 04/27/2013 1300   MCV 94.1 03/01/2014 0957   MCV 91.3 04/27/2013 1300   MCH 31.1 03/01/2014 0957   MCH 32.2 04/27/2013 1300   MCHC 33.0 03/01/2014 0957   MCHC 35.3 04/27/2013 1300   RDW 23.3* 03/01/2014 0957   RDW 18.1* 04/27/2013 1300   LYMPHSABS 1.4 03/01/2014 0957   LYMPHSABS 1.2 07/02/2011 1100   MONOABS 0.9 03/01/2014 0957   MONOABS 0.5 07/02/2011 1100   EOSABS 0.6* 03/01/2014 0957   EOSABS 0.4 07/02/2011 1100   BASOSABS 0.0 03/01/2014 0957   BASOSABS 0.0 07/02/2011 1100     Chemistry      Component Value Date/Time   NA 140 02/03/2014 1152   NA 138 04/27/2013 1300   NA 139 10/15/2011 1601   K 3.8 02/03/2014 1152   K 4.2 04/27/2013 1300   K 4.3 10/15/2011 1601   CL 108* 06/17/2013 1200   CL 104 04/27/2013 1300   CL 101 10/15/2011 1601   CO2 25 02/03/2014 1152   CO2 24 04/27/2013 1300   CO2 30 10/15/2011 1601   BUN 11.3 02/03/2014 1152   BUN 13 04/27/2013 1300   BUN 17 10/15/2011 1601   CREATININE 1.1 02/03/2014 1152   CREATININE  1.15 04/27/2013 1300   CREATININE 1.1 10/15/2011 1601      Component Value Date/Time   CALCIUM 9.5 02/03/2014 1152   CALCIUM 9.9 04/27/2013 1300   CALCIUM 9.3 10/15/2011 1601   ALKPHOS 57 02/03/2014 1152   ALKPHOS 50 12/31/2012 1632   ALKPHOS 59 10/15/2011 1601   AST 17 02/03/2014 1152   AST 19 12/31/2012 1632   AST 35 10/15/2011 1601   ALT 10 02/03/2014 1152   ALT 14 12/31/2012 1632   ALT 37 10/15/2011 1601   BILITOT 0.88 02/03/2014 1152   BILITOT 0.6 12/31/2012 1632   BILITOT 1.30 10/15/2011 1601        Impression:   #1. Colon cancer metastatic to peritoneum and likely lung (left lower lobe lesion which appeared between January and April CT scans)  Currently with stable disease on combination of insulin plus Avastin. He understands that we will not be able to  cure his cancer and that he will need to be on some form of treatment and definitely although we can continue to modify history concern at times, and give him a drug holiday. He is KRAS wild type. At the advice of Dr. Benay Spice, we recently sent off a expanded genetic profile and liver no significant additional genetic findings that would prohibit a trial of an EGFR receptor inhibitor such as cetuximab or panitumumab as part of his treatment program in the future.  #2. Metachronous primary bilateral non-small cell lung cancers  Initial lesion in the left upper lung treated with surgical resection and brachy therapy in June, 2007with no recurrence. Second tumor in the right lung treated with observation alone with complete regression when Avastin was started for his colon cancer.Right lower lobe lung nodule resolved on anti-colon cancer treatment and was likely a pulmonary metastasis from the colon.  .  #3. Oxaliplatin related neuropathy  Improved significantly off oxaliplatin.   #4. Type 2 diabetes   #5. History of nephrolithiasis   #6. Status post left corneal transplant.  I will transition his care to Dr. Benay Spice. Continue Xeloda  and Avastin for now. Followup CT scans in May prior to visit with Dr. Benay Spice. Continue to monitor labs on a monthly basis.     CC: Patient Care Team: Jerlyn Ly, MD as PCP - General (Internal Medicine) Earnstine Regal, MD as Consulting Physician (General Surgery)   Annia Belt, MD 3/9/20153:59 PM

## 2014-03-01 NOTE — Telephone Encounter (Signed)
gv pt appt schedule for march thru june. central will call pt w/ct appt - pt aware.

## 2014-03-03 ENCOUNTER — Ambulatory Visit (HOSPITAL_BASED_OUTPATIENT_CLINIC_OR_DEPARTMENT_OTHER): Payer: Medicare Other

## 2014-03-03 ENCOUNTER — Other Ambulatory Visit (HOSPITAL_BASED_OUTPATIENT_CLINIC_OR_DEPARTMENT_OTHER): Payer: Medicare Other

## 2014-03-03 ENCOUNTER — Other Ambulatory Visit: Payer: Self-pay | Admitting: Oncology

## 2014-03-03 ENCOUNTER — Other Ambulatory Visit: Payer: Self-pay | Admitting: Dermatology

## 2014-03-03 VITALS — BP 144/75 | HR 81 | Temp 97.6°F | Resp 18

## 2014-03-03 DIAGNOSIS — Z5112 Encounter for antineoplastic immunotherapy: Secondary | ICD-10-CM

## 2014-03-03 DIAGNOSIS — C185 Malignant neoplasm of splenic flexure: Secondary | ICD-10-CM

## 2014-03-03 DIAGNOSIS — C189 Malignant neoplasm of colon, unspecified: Secondary | ICD-10-CM

## 2014-03-03 DIAGNOSIS — C786 Secondary malignant neoplasm of retroperitoneum and peritoneum: Secondary | ICD-10-CM

## 2014-03-03 LAB — CBC WITH DIFFERENTIAL/PLATELET
BASO%: 0.3 % (ref 0.0–2.0)
Basophils Absolute: 0 10*3/uL (ref 0.0–0.1)
EOS ABS: 0.5 10*3/uL (ref 0.0–0.5)
EOS%: 5.4 % (ref 0.0–7.0)
HCT: 47.6 % (ref 38.4–49.9)
HEMOGLOBIN: 16.3 g/dL (ref 13.0–17.1)
LYMPH#: 1.3 10*3/uL (ref 0.9–3.3)
LYMPH%: 13.3 % — ABNORMAL LOW (ref 14.0–49.0)
MCH: 31.2 pg (ref 27.2–33.4)
MCHC: 34.2 g/dL (ref 32.0–36.0)
MCV: 91 fL (ref 79.3–98.0)
MONO#: 0.7 10*3/uL (ref 0.1–0.9)
MONO%: 7.1 % (ref 0.0–14.0)
NEUT%: 73.9 % (ref 39.0–75.0)
NEUTROS ABS: 7 10*3/uL — AB (ref 1.5–6.5)
Platelets: 140 10*3/uL (ref 140–400)
RBC: 5.23 10*6/uL (ref 4.20–5.82)
RDW: 20 % — AB (ref 11.0–14.6)
WBC: 9.5 10*3/uL (ref 4.0–10.3)

## 2014-03-03 LAB — LACTATE DEHYDROGENASE (CC13): LDH: 217 U/L (ref 125–245)

## 2014-03-03 LAB — COMPREHENSIVE METABOLIC PANEL (CC13)
ALBUMIN: 3.4 g/dL — AB (ref 3.5–5.0)
ALT: 10 U/L (ref 0–55)
AST: 15 U/L (ref 5–34)
Alkaline Phosphatase: 66 U/L (ref 40–150)
Anion Gap: 7 mEq/L (ref 3–11)
BUN: 12.6 mg/dL (ref 7.0–26.0)
CALCIUM: 9.8 mg/dL (ref 8.4–10.4)
CHLORIDE: 109 meq/L (ref 98–109)
CO2: 23 mEq/L (ref 22–29)
Creatinine: 1.1 mg/dL (ref 0.7–1.3)
GLUCOSE: 112 mg/dL (ref 70–140)
POTASSIUM: 4.1 meq/L (ref 3.5–5.1)
SODIUM: 139 meq/L (ref 136–145)
TOTAL PROTEIN: 6.1 g/dL — AB (ref 6.4–8.3)
Total Bilirubin: 1.13 mg/dL (ref 0.20–1.20)

## 2014-03-03 MED ORDER — HEPARIN SOD (PORK) LOCK FLUSH 100 UNIT/ML IV SOLN
500.0000 [IU] | Freq: Once | INTRAVENOUS | Status: AC | PRN
Start: 2014-03-03 — End: 2014-03-03
  Administered 2014-03-03: 500 [IU]
  Filled 2014-03-03: qty 5

## 2014-03-03 MED ORDER — BEVACIZUMAB CHEMO INJECTION 400 MG/16ML
5.0000 mg/kg | Freq: Once | INTRAVENOUS | Status: AC
  Administered 2014-03-03: 400 mg via INTRAVENOUS
  Filled 2014-03-03: qty 16

## 2014-03-03 MED ORDER — SODIUM CHLORIDE 0.9 % IV SOLN
Freq: Once | INTRAVENOUS | Status: AC
  Administered 2014-03-03: 12:00:00 via INTRAVENOUS

## 2014-03-03 MED ORDER — SODIUM CHLORIDE 0.9 % IJ SOLN
10.0000 mL | INTRAMUSCULAR | Status: DC | PRN
  Administered 2014-03-03: 10 mL
  Filled 2014-03-03: qty 10

## 2014-03-03 NOTE — Patient Instructions (Signed)
[facility] Discharge Instructions for Patients Receiving Chemotherapy  Today you received the following chemotherapy agents: Avastin.  To help prevent nausea and vomiting after your treatment, we encourage you to take your nausea medication.   If you develop nausea and vomiting that is not controlled by your nausea medication, call the clinic.   BELOW ARE SYMPTOMS THAT SHOULD BE REPORTED IMMEDIATELY:  *FEVER GREATER THAN 100.5 F  *CHILLS WITH OR WITHOUT FEVER  NAUSEA AND VOMITING THAT IS NOT CONTROLLED WITH YOUR NAUSEA MEDICATION  *UNUSUAL SHORTNESS OF BREATH  *UNUSUAL BRUISING OR BLEEDING  TENDERNESS IN MOUTH AND THROAT WITH OR WITHOUT PRESENCE OF ULCERS  *URINARY PROBLEMS  *BOWEL PROBLEMS  UNUSUAL RASH Items with * indicate a potential emergency and should be followed up as soon as possible.  Feel free to call the clinic you have any questions or concerns. The clinic phone number is [phone].

## 2014-03-04 LAB — CEA: CEA: 15.6 ng/mL — ABNORMAL HIGH (ref 0.0–5.0)

## 2014-03-05 ENCOUNTER — Telehealth: Payer: Self-pay | Admitting: *Deleted

## 2014-03-05 NOTE — Telephone Encounter (Signed)
Notified pt of CEA result per Dr Azucena Freed request.  Pt did not have any questions. Informed that result should be routed to Dr Benay Spice per Dr Beryle Beams.

## 2014-03-05 NOTE — Telephone Encounter (Signed)
Message copied by Jesse Fall on Fri Mar 05, 2014  2:38 PM ------      Message from: Annia Belt      Created: Thu Mar 04, 2014  7:27 AM       Call pt  CEA up slightly from 10.4 to 15.6  Lab forwarded to Dr Benay Spice ------

## 2014-03-08 ENCOUNTER — Other Ambulatory Visit: Payer: Self-pay | Admitting: *Deleted

## 2014-03-08 DIAGNOSIS — C3490 Malignant neoplasm of unspecified part of unspecified bronchus or lung: Secondary | ICD-10-CM

## 2014-03-08 NOTE — Telephone Encounter (Signed)
THIS REFILL REQUEST FOR CAPECITABINE WAS PLACED IN DR.GRANFORTUNA'S ACTIVE WORK FOLDER.

## 2014-03-09 MED ORDER — CAPECITABINE 500 MG PO TABS: 2000.0000 mg | ORAL_TABLET | Freq: Two times a day (BID) | ORAL | Status: DC

## 2014-03-09 NOTE — Telephone Encounter (Signed)
RECEIVED A FAX FROM [facility] CONCERNING A CONFIRMATION OF FACSIMILE RECEIPT FOR PT. REFERRAL. 

## 2014-03-09 NOTE — Addendum Note (Signed)
Addended by: Wyonia Hough on: 03/09/2014 09:44 AM   Modules accepted: Orders

## 2014-03-12 NOTE — Telephone Encounter (Signed)
RECEIVED A FAX FROM [facility] CONCERNING A CONFIRMATION OF PRESCRIPTION SHIPMENT FOR CAPECITABINE ON 03/11/14.

## 2014-03-16 ENCOUNTER — Other Ambulatory Visit: Payer: Self-pay | Admitting: Dermatology

## 2014-03-17 ENCOUNTER — Ambulatory Visit (HOSPITAL_BASED_OUTPATIENT_CLINIC_OR_DEPARTMENT_OTHER): Payer: Medicare Other

## 2014-03-17 VITALS — BP 131/64 | HR 75 | Temp 97.5°F | Resp 18

## 2014-03-17 DIAGNOSIS — C189 Malignant neoplasm of colon, unspecified: Secondary | ICD-10-CM

## 2014-03-17 DIAGNOSIS — C786 Secondary malignant neoplasm of retroperitoneum and peritoneum: Secondary | ICD-10-CM

## 2014-03-17 DIAGNOSIS — Z5112 Encounter for antineoplastic immunotherapy: Secondary | ICD-10-CM

## 2014-03-17 DIAGNOSIS — C185 Malignant neoplasm of splenic flexure: Secondary | ICD-10-CM

## 2014-03-17 LAB — UA PROTEIN, DIPSTICK - CHCC: Protein, ur: 100 mg/dL

## 2014-03-17 MED ORDER — SODIUM CHLORIDE 0.9 % IV SOLN
Freq: Once | INTRAVENOUS | Status: AC
  Administered 2014-03-17: 13:00:00 via INTRAVENOUS

## 2014-03-17 MED ORDER — SODIUM CHLORIDE 0.9 % IJ SOLN
10.0000 mL | INTRAMUSCULAR | Status: DC | PRN
  Administered 2014-03-17: 10 mL
  Filled 2014-03-17: qty 10

## 2014-03-17 MED ORDER — HEPARIN SOD (PORK) LOCK FLUSH 100 UNIT/ML IV SOLN
500.0000 [IU] | Freq: Once | INTRAVENOUS | Status: AC | PRN
  Administered 2014-03-17: 500 [IU]
  Filled 2014-03-17: qty 5

## 2014-03-17 MED ORDER — SODIUM CHLORIDE 0.9 % IV SOLN
5.0000 mg/kg | Freq: Once | INTRAVENOUS | Status: AC
  Administered 2014-03-17: 400 mg via INTRAVENOUS
  Filled 2014-03-17: qty 16

## 2014-03-17 NOTE — Patient Instructions (Addendum)
[facility] Discharge Instructions for Patients Receiving Chemotherapy  Today you received the following chemotherapy agents Avastin.  To help prevent nausea and vomiting after your treatment, we encourage you to take your nausea medication.   If you develop nausea and vomiting that is not controlled by your nausea medication, call the clinic.   BELOW ARE SYMPTOMS THAT SHOULD BE REPORTED IMMEDIATELY:  *FEVER GREATER THAN 100.5 F  *CHILLS WITH OR WITHOUT FEVER  NAUSEA AND VOMITING THAT IS NOT CONTROLLED WITH YOUR NAUSEA MEDICATION  *UNUSUAL SHORTNESS OF BREATH  *UNUSUAL BRUISING OR BLEEDING  TENDERNESS IN MOUTH AND THROAT WITH OR WITHOUT PRESENCE OF ULCERS  *URINARY PROBLEMS  *BOWEL PROBLEMS  UNUSUAL RASH Items with * indicate a potential emergency and should be followed up as soon as possible.  Feel free to call the clinic you have any questions or concerns. The clinic phone number is [phone].

## 2014-03-22 ENCOUNTER — Other Ambulatory Visit: Payer: Self-pay | Admitting: *Deleted

## 2014-03-22 NOTE — Telephone Encounter (Signed)
THIS REFILL REQUEST FOR CAPECITABINE WAS GIVEN TO MYRTLE HARDIN,RN.

## 2014-03-29 ENCOUNTER — Other Ambulatory Visit: Payer: Self-pay | Admitting: *Deleted

## 2014-03-29 DIAGNOSIS — C3490 Malignant neoplasm of unspecified part of unspecified bronchus or lung: Secondary | ICD-10-CM

## 2014-03-29 MED ORDER — CAPECITABINE 500 MG PO TABS: 2000.0000 mg | ORAL_TABLET | Freq: Two times a day (BID) | ORAL | Status: DC

## 2014-03-31 ENCOUNTER — Other Ambulatory Visit: Payer: Self-pay

## 2014-03-31 ENCOUNTER — Ambulatory Visit: Payer: Medicare Other

## 2014-03-31 ENCOUNTER — Ambulatory Visit (HOSPITAL_BASED_OUTPATIENT_CLINIC_OR_DEPARTMENT_OTHER): Payer: Medicare Other | Admitting: Lab

## 2014-03-31 DIAGNOSIS — E119 Type 2 diabetes mellitus without complications: Secondary | ICD-10-CM

## 2014-03-31 DIAGNOSIS — C189 Malignant neoplasm of colon, unspecified: Secondary | ICD-10-CM

## 2014-03-31 DIAGNOSIS — C185 Malignant neoplasm of splenic flexure: Secondary | ICD-10-CM

## 2014-03-31 DIAGNOSIS — C786 Secondary malignant neoplasm of retroperitoneum and peritoneum: Secondary | ICD-10-CM

## 2014-03-31 LAB — CBC WITH DIFFERENTIAL/PLATELET
BASO%: 0.6 % (ref 0.0–2.0)
Basophils Absolute: 0.1 10*3/uL (ref 0.0–0.1)
EOS ABS: 0.5 10*3/uL (ref 0.0–0.5)
EOS%: 4.5 % (ref 0.0–7.0)
HCT: 48.1 % (ref 38.4–49.9)
HEMOGLOBIN: 16 g/dL (ref 13.0–17.1)
LYMPH%: 11.6 % — AB (ref 14.0–49.0)
MCH: 31.3 pg (ref 27.2–33.4)
MCHC: 33.2 g/dL (ref 32.0–36.0)
MCV: 94.2 fL (ref 79.3–98.0)
MONO#: 0.8 10*3/uL (ref 0.1–0.9)
MONO%: 7.5 % (ref 0.0–14.0)
NEUT%: 75.8 % — ABNORMAL HIGH (ref 39.0–75.0)
NEUTROS ABS: 7.9 10*3/uL — AB (ref 1.5–6.5)
PLATELETS: 187 10*3/uL (ref 140–400)
RBC: 5.1 10*6/uL (ref 4.20–5.82)
RDW: 21.6 % — AB (ref 11.0–14.6)
WBC: 10.5 10*3/uL — ABNORMAL HIGH (ref 4.0–10.3)
lymph#: 1.2 10*3/uL (ref 0.9–3.3)

## 2014-03-31 LAB — COMPREHENSIVE METABOLIC PANEL (CC13)
ALBUMIN: 3.3 g/dL — AB (ref 3.5–5.0)
ALK PHOS: 64 U/L (ref 40–150)
ALT: 6 U/L (ref 0–55)
ANION GAP: 7 meq/L (ref 3–11)
AST: 14 U/L (ref 5–34)
BUN: 11.1 mg/dL (ref 7.0–26.0)
CO2: 25 mEq/L (ref 22–29)
Calcium: 9.7 mg/dL (ref 8.4–10.4)
Chloride: 108 mEq/L (ref 98–109)
Creatinine: 1.2 mg/dL (ref 0.7–1.3)
GLUCOSE: 124 mg/dL (ref 70–140)
POTASSIUM: 4.1 meq/L (ref 3.5–5.1)
Sodium: 139 mEq/L (ref 136–145)
Total Bilirubin: 1.04 mg/dL (ref 0.20–1.20)
Total Protein: 6.2 g/dL — ABNORMAL LOW (ref 6.4–8.3)

## 2014-03-31 LAB — UA PROTEIN, DIPSTICK - CHCC: Protein, ur: 300 mg/dL

## 2014-03-31 LAB — LACTATE DEHYDROGENASE (CC13): LDH: 224 U/L (ref 125–245)

## 2014-03-31 NOTE — Progress Notes (Signed)
Per Dr Benay Spice hold treatment d/t urine protein 300.  Ordered 24 hour urine per Dr. Benay Spice - pt sent back to lab.

## 2014-04-01 LAB — CEA: CEA: 20.3 ng/mL — AB (ref 0.0–5.0)

## 2014-04-02 LAB — PROTEIN, URINE, 24 HOUR
COLLECTION INTERVAL-UPROT: 24 h
Protein, 24H Urine: 814 mg/d — ABNORMAL HIGH (ref 50–100)
Protein, Urine: 74 mg/dL
Urine Total Volume-UPROT: 1100 mL

## 2014-04-08 ENCOUNTER — Telehealth: Payer: Self-pay | Admitting: *Deleted

## 2014-04-08 NOTE — Telephone Encounter (Signed)
Message copied by Norma Fredrickson on Thu Apr 08, 2014  1:39 PM ------      Message from: [place], [place] K      Created: Thu Apr 08, 2014  1:23 PM       Tanesia Butner, please let him know we need to hold Avastin due to elevated urine protein. He should continue Xeloda as he is currently taking. We will see him as scheduled on 04/14/2014. Thanks.      ----- Message -----         From: Ladell Pier, MD         Sent: 04/07/2014   8:35 PM           To: Owens Shark, NP            Hold avastin, continue xeloda, check cea 4/22, are CTs scheduled?      ----- Message -----         From: [facility]         Sent: 03/31/2014  11:55 AM           To: Ladell Pier, MD                         ------

## 2014-04-08 NOTE — Telephone Encounter (Signed)
Call from pt, he has questions about Avastin and 24 hour urine protein results. Asks if he will be able to take Avastin next week? Pt has continued Xeloda as prescribed. Informed pt he will see APP before next treatment. Will review lab that day and make a recommendation then. He voiced understanding.

## 2014-04-08 NOTE — Telephone Encounter (Signed)
Called and informed patient to hold Avastin due to elevated urine protein and to continue taking Xeloda as he is currently taking.  Also, informed patient that we will see him on 04/14/14.  Per Elby Showers. Thomas.  Patient verbalized understanding.

## 2014-04-09 ENCOUNTER — Encounter: Payer: Self-pay | Admitting: *Deleted

## 2014-04-09 NOTE — Progress Notes (Signed)
RECEIVED A FAX FROM [facility] CONCERNING A CONFIRMATION OF PRESCRIPTION SHIPMENT FOR CAPECITABINE ON 04/08/14.

## 2014-04-14 ENCOUNTER — Telehealth: Payer: Self-pay | Admitting: Oncology

## 2014-04-14 ENCOUNTER — Ambulatory Visit: Payer: Medicare Other

## 2014-04-14 ENCOUNTER — Other Ambulatory Visit (HOSPITAL_BASED_OUTPATIENT_CLINIC_OR_DEPARTMENT_OTHER): Payer: Medicare Other

## 2014-04-14 ENCOUNTER — Ambulatory Visit (HOSPITAL_BASED_OUTPATIENT_CLINIC_OR_DEPARTMENT_OTHER): Payer: Medicare Other | Admitting: Nurse Practitioner

## 2014-04-14 VITALS — BP 143/79 | HR 82 | Temp 97.0°F | Resp 22 | Ht 67.0 in | Wt 174.9 lb

## 2014-04-14 DIAGNOSIS — R3919 Other difficulties with micturition: Secondary | ICD-10-CM

## 2014-04-14 DIAGNOSIS — C786 Secondary malignant neoplasm of retroperitoneum and peritoneum: Secondary | ICD-10-CM

## 2014-04-14 DIAGNOSIS — G622 Polyneuropathy due to other toxic agents: Secondary | ICD-10-CM

## 2014-04-14 DIAGNOSIS — C189 Malignant neoplasm of colon, unspecified: Secondary | ICD-10-CM

## 2014-04-14 DIAGNOSIS — C185 Malignant neoplasm of splenic flexure: Secondary | ICD-10-CM

## 2014-04-14 DIAGNOSIS — R109 Unspecified abdominal pain: Secondary | ICD-10-CM

## 2014-04-14 DIAGNOSIS — T451X5A Adverse effect of antineoplastic and immunosuppressive drugs, initial encounter: Secondary | ICD-10-CM

## 2014-04-14 DIAGNOSIS — C3410 Malignant neoplasm of upper lobe, unspecified bronchus or lung: Secondary | ICD-10-CM

## 2014-04-14 DIAGNOSIS — E119 Type 2 diabetes mellitus without complications: Secondary | ICD-10-CM

## 2014-04-14 NOTE — Telephone Encounter (Signed)
gv adn printed appt sched and avs for pt for May.Marland KitchenMarland KitchenMarland Kitchen

## 2014-04-14 NOTE — Progress Notes (Addendum)
[facility] OFFICE PROGRESS NOTE   Diagnosis:  Metastatic colon cancer.  INTERVAL HISTORY:   Mr. Bas is a 73 year old man with metastatic colon cancer previously followed by Dr. Beryle Beams. He is seen today to establish care with Dr. Benay Spice.  To review, he was initially diagnosed with stage III colon cancer in July 2012 status post left colectomy 07/09/2011. He developed a rise in the CEA tumor marker and 2 intra-abdominal soft tissue densities on followup CT scan in November 2012 while on adjuvant Xeloda chemotherapy. Oxaliplatin and Avastin were added to the regimen with a steady fall in the CEA. He continued on this regimen from 11/26/2011 through 04/23/2012. He developed cumulative neurotoxicity related to the oxaliplatin necessitating discontinuation. Xeloda was continued on a 1 week on/1 week off schedule and Avastin was continued every 2 weeks. Restaging CT evaluation 04/09/2013 showed increase in size of previously noted peritoneal implants. He underwent placement of a Port-A-Cath on 04/27/2013 and began treatment on the FOLFIRI regimen on 04/29/2013. The irinotecan was dose reduced following cycle 1 due to mucositis. Avastin was added beginning with cycle 3. Restaging CT evaluation on 07/27/2013 (after 6 cycles ) showed interval resolution of previously described left lower lobe nodule. There was interval improvement in peritoneal nodularity. He completed a total of 14 cycles through 11/23/2013. He had a significant decline in his performance status and the treatment was adjusted to [facility] following an office visit 12/07/2013.  Avastin was placed on hold beginning 03/31/2014 due to increased urine protein. A 24-hour urine was obtained showing 814 mg of protein. He has continued Xeloda on a 1 week on/1 week off schedule.  Most recent CEA on 03/31/2014 was mildly increased at 20.3 as compared to 15.6 on 03/03/2014 and 10.4 on 02/03/2014.  He overall feels well. He  has a good appetite. He denies nausea/vomiting. No mouth sores. Occasional loose stools. No hand or foot pain or redness. He has mild numbness in the fingertips and toes. His main complaint today is a one-month history of intermittent "achy" abdominal pain at the upper abdomen. He states the pain is similar to "hunger pain". Food at times will relieve the pain. He estimates the duration of pain from 2-4 hours. The pain does not occur on a daily basis.    Objective:  Vital signs in last 24 hours:  Blood pressure 143/79, pulse 82, temperature 97 F (36.1 C), temperature source Oral, resp. rate 22, height 5' 7"  (1.702 m), weight 174 lb 14.4 oz (79.334 kg).    HEENT: No thrush or ulcerations. Lymphatics: No palpable cervical, supraclavicular or axillary lymph nodes. Resp: Lungs clear. Cardio: Regular cardiac rhythm. GI: Abdomen soft and nontender. No hepatomegaly. No mass. Vascular: No leg edema. Skin: Palms without erythema.    Lab Results:  Lab Results  Component Value Date   WBC 10.5* 03/31/2014   HGB 16.0 03/31/2014   HCT 48.1 03/31/2014   MCV 94.2 03/31/2014   PLT 187 03/31/2014   NEUTROABS 7.9* 03/31/2014    Imaging:  No results found.  Medications: I have reviewed the patient's current medications.  Assessment/Plan: 1. 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 K-ras mutation not detected on initial testing. Extended testing returned negative for the K-ras mutation. Details of diagnosis and previous treatment as outlined above. Most recently treated with Xeloda/Avastin. Avastin placed on hold beginning 03/31/2014 due to increased urine protein.  2. Status post Port-A-Cath placement 04/27/2013. 3. Status post resection of a stage I non-small cell lung cancer left  upper lung with postop brachytherapy June 2007. 4. Vague, alveolar density right lower lung felt to be a second primary bronchial alveolar lung cancer found at the same time as his initial cancer in the left upper  lung. Radiographic complete regression on Avastin based chemotherapy. 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 6. Type 2 diabetes. 7. History of nephrolithiasis. 8. Status post bilateral corneal transplants. 9. Increased urine protein on Avastin. 10. Intermittent upper abdominal pain.   Disposition: Mr. Stepanek has metastatic colon cancer. The CEA is slowly rising. He appears asymptomatic. Avastin is on hold due to increased urine protein. Dr. Benay Spice recommends a treatment break. He will complete Xeloda this week and then discontinue.  Mr. Bruun is scheduled for restaging CT scans in May. He will return for a followup visit with Dr. Benay Spice on 05/24/2014 to review the CT results and discuss additional treatment.  We had a preliminary discussion regarding future treatment including retreatment with oxaliplatin, irinotecan/Panitumumab, clinical trial.  For the abdominal pain he will try an antacid. He understands to contact the office if the pain worsens.   Patient seen with Dr. Benay Spice. 30 minutes were spent face-to-face at today's visit with the majority of that time involved in counseling/coordination of care    Owens Shark ANP/GNP-BC   04/14/2014  1:53 PM  This was a shared visit with Ned Card.  He has metastatic colon cancer with a rising CEA.  Treatment will be placed on hold and he will have restaging CTs. I discussed treatment options with Mr. Reiber.  Julieanne Manson, MD

## 2014-04-15 LAB — CEA: CEA: 23.8 ng/mL — AB (ref 0.0–5.0)

## 2014-04-28 ENCOUNTER — Ambulatory Visit (HOSPITAL_BASED_OUTPATIENT_CLINIC_OR_DEPARTMENT_OTHER): Payer: Medicare Other

## 2014-04-28 ENCOUNTER — Other Ambulatory Visit: Payer: Medicare Other

## 2014-04-28 ENCOUNTER — Ambulatory Visit: Payer: Medicare Other

## 2014-04-28 VITALS — BP 133/72 | HR 94

## 2014-04-28 DIAGNOSIS — C185 Malignant neoplasm of splenic flexure: Secondary | ICD-10-CM

## 2014-04-28 DIAGNOSIS — Z95828 Presence of other vascular implants and grafts: Secondary | ICD-10-CM

## 2014-04-28 DIAGNOSIS — C786 Secondary malignant neoplasm of retroperitoneum and peritoneum: Secondary | ICD-10-CM

## 2014-04-28 DIAGNOSIS — Z452 Encounter for adjustment and management of vascular access device: Secondary | ICD-10-CM

## 2014-04-28 MED ORDER — SODIUM CHLORIDE 0.9 % IJ SOLN
10.0000 mL | INTRAMUSCULAR | Status: DC | PRN
  Administered 2014-04-28: 10 mL via INTRAVENOUS
  Filled 2014-04-28: qty 10

## 2014-04-28 MED ORDER — HEPARIN SOD (PORK) LOCK FLUSH 100 UNIT/ML IV SOLN
500.0000 [IU] | Freq: Once | INTRAVENOUS | Status: AC
  Administered 2014-04-28: 500 [IU] via INTRAVENOUS
  Filled 2014-04-28: qty 5

## 2014-04-29 ENCOUNTER — Ambulatory Visit
Admission: RE | Admit: 2014-04-29 | Discharge: 2014-04-29 | Disposition: A | Discharge status: Home / Self Care | Payer: Medicare Other | Source: Ambulatory Visit | Attending: Internal Medicine | Admitting: Internal Medicine

## 2014-04-29 ENCOUNTER — Other Ambulatory Visit: Payer: Self-pay | Admitting: Internal Medicine

## 2014-04-29 DIAGNOSIS — R109 Unspecified abdominal pain: Secondary | ICD-10-CM

## 2014-04-29 DIAGNOSIS — C189 Malignant neoplasm of colon, unspecified: Secondary | ICD-10-CM

## 2014-04-29 DIAGNOSIS — C3490 Malignant neoplasm of unspecified part of unspecified bronchus or lung: Secondary | ICD-10-CM

## 2014-04-29 DIAGNOSIS — R10811 Right upper quadrant abdominal tenderness: Secondary | ICD-10-CM

## 2014-04-29 MED ORDER — IOHEXOL 300 MG/ML  SOLN
100.0000 mL | Freq: Once | INTRAMUSCULAR | Status: AC | PRN
Start: 2014-04-29 — End: 2014-04-29
  Administered 2014-04-29: 100 mL via INTRAVENOUS

## 2014-04-30 ENCOUNTER — Telehealth: Payer: Self-pay | Admitting: Gastroenterology

## 2014-04-30 ENCOUNTER — Telehealth: Payer: Self-pay | Admitting: *Deleted

## 2014-04-30 NOTE — Telephone Encounter (Signed)
Spoke with [name] at Dr. Silvestre Mesi office and he wants patient to be seen on Monday for new gastric mass. Patient has known met. Colon cancer and is seen by Dr. Benay Spice. Dr. Henrene Pastor reviewed CT and patient was scheduled with [name], PA on 05/03/14 at 10:30 AM.

## 2014-04-30 NOTE — Telephone Encounter (Signed)
Message from pt, was told to make Dr. Benay Spice aware that he had CT done 04/29/14 by PCP. Dr. Benay Spice made aware. Pt is scheduled to see GI on 05/03/14. Will arrange for sooner appt with Dr. Benay Spice.

## 2014-05-03 ENCOUNTER — Ambulatory Visit: Payer: Medicare Other | Admitting: Physician Assistant

## 2014-05-03 ENCOUNTER — Encounter: Payer: Self-pay | Admitting: Physician Assistant

## 2014-05-03 VITALS — BP 120/62 | HR 87 | Ht 67.0 in | Wt 175.0 lb

## 2014-05-03 DIAGNOSIS — K3189 Other diseases of stomach and duodenum: Secondary | ICD-10-CM

## 2014-05-03 DIAGNOSIS — R1013 Epigastric pain: Secondary | ICD-10-CM

## 2014-05-03 DIAGNOSIS — Z85038 Personal history of other malignant neoplasm of large intestine: Secondary | ICD-10-CM

## 2014-05-03 NOTE — Progress Notes (Addendum)
Subjective:    Patient ID: [name], male    DOB: [birth year], 73 y.o.   MRN: [id number]  HPI   [name] is a pleasant 73 year old white male formerly known to Dr. Sharlett Iles who had not been seen since 2012 at that time he was diagnosed with colon cancer. He is  a primary patient of Dr. Joylene Draft  and has recently established with Dr. Benay Spice . He was initially diagnosed with a stage III colon cancer in July of 2012 and underwent a left hemicolectomy. Followup CT Scan in November of 2012 , he was  Found to Have 2 Intra-Abdominal Soft Tissue Densities. He Was Already on Xeloda . Since Then He Has Undergone Multiple Rounds of  Chemotherapy ,most recently with Xeloda and Avastin. Avastin Was Held As of April 2015 Due to Increase in His Urine Protein and He Is Currently on a Break  From Xeloda . Patient states that he developed epigastric pain over the past 2 months and that this has become fairly constant over the past 6 weeks. He has had some discomfort radiating up into his lower chest and around into his back as well. He has not had any nausea or vomiting, his appetite has been fairly good. He has no definite association with eating though says at times he may feel a bit better with something on his stomach. Over the past week he says the pain has been bothering him more at night and making it difficult to sleep. He has no dysphagia odynophagia heartburn or indigestion. He has not noted in any melena or hematochezia. He had tried some Pepcid but did not find any relief.  Patient had undergone Re-staging CT scans on 04/29/2014. CT of the chest shows no findings of pulmonary metastatic disease, no pleural effusions. CT of the abdomen and pelvis showed mild diffuse fatty infiltration, but no focal hepatic lesions, mildly distended gallbladder, no inflammatory changes and no gallstones. There is a probable mass involving the anterior wall of the stomach where the lumen is significantly narrowed this measures  approximately 5 cm. He is also noted to have enlarging peritoneal implants in the left upper quadrant the largest measuring 16 mm.     Review of Systems  Constitutional: Negative.   HENT: Negative.   Eyes: Negative.   Respiratory: Negative.   Cardiovascular: Negative.   Gastrointestinal: Positive for nausea and abdominal pain.  Endocrine: Negative.   Genitourinary: Negative.   Musculoskeletal: Negative.   Skin: Negative.   Allergic/Immunologic: Negative.   Neurological: Negative.   Hematological: Negative.   Psychiatric/Behavioral: Negative.    Outpatient Prescriptions Prior to Visit  Medication Sig Dispense Refill  . amoxicillin (AMOXIL) 500 MG capsule       . aspirin 81 MG tablet Take 81 mg by mouth daily.       . Cholecalciferol (VITAMIN D3) 2000 UNITS TABS Take 1 tablet by mouth daily.       . cyanocobalamin 500 MCG tablet Take 1,000 mcg by mouth daily.       . dorzolamide-timolol (COSOPT) 22.3-6.8 MG/ML ophthalmic solution Place 1 drop into the left eye 2 (two) times daily.       Marland Kitchen ezetimibe-simvastatin (VYTORIN) 10-40 MG per tablet 0.5 tablets 3 (three) times a week. 1/2 tab 4-5 times per week      . HYDROcodone-acetaminophen (NORCO/VICODIN) 5-325 MG per tablet 1-2 po q 4h prn pain  75 tablet  0  . lidocaine (XYLOCAINE) 2 % solution 5% solution. 70mls gargle and spit  out every 2 hours as needed  100 mL  4  . loperamide (IMODIUM A-D) 2 MG tablet Take by mouth as needed for diarrhea or loose stools. Take 2 tablets at the first sign of diarrhea then take one every 2 hours. Take 2 tablets at bedtime. Stop when you have not had a loose stool in twelve hours.      Marland Kitchen loratadine (CLARITIN) 10 MG tablet Take 10 mg by mouth 2 (two) times daily.        Marland Kitchen LORazepam (ATIVAN) 0.5 MG tablet Take one to two at bedtime for sleep as needed  30 tablet  1  . loteprednol (LOTEMAX) 0.5 % ophthalmic suspension Place 1 drop into the left eye daily.       . metFORMIN (GLUMETZA) 1000 MG (MOD) 24 hr  tablet Take 1,000 mg by mouth daily after supper.       . Sean-3 Fatty Acids (FISH OIL) 1000 MG CAPS Take 6 capsules by mouth daily.       . ondansetron (ZOFRAN) 8 MG tablet Take 1 tablet (8 mg total) by mouth every 12 (twelve) hours as needed for nausea.  30 tablet  1  . oxyCODONE-acetaminophen (PERCOCET/ROXICET) 5-325 MG per tablet       . testosterone enanthate (DELATESTRYL) 200 MG/ML injection Inject 500 mg into the muscle every 21 ( twenty-one) days. Every 3 weeks currently      . Bevacizumab (AVASTIN IV) Inject into the vein. Every 2 weeks      . capecitabine (XELODA) 500 MG tablet Take 4 tablets (2,000 mg total) by mouth 2 (two) times daily after a meal. 7 days on then 7 days rest  56 tablet  0   No facility-administered medications prior to visit.   No Known Allergies     Objective:   Physical Exam Well-developed older white male in no acute distress, pleasant accompanied by his wife blood pressure 120/62 pulse 87 height 5 foot 7 weight 175. 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 He has positive K ras  mutation. Recent restaging CT showed enlarging peritoneal implants in the left upper quadrant of the abdomen and a probable 5 cm mass in the gastric antrum with luminal narrowing. Patient symptomatic with epigastric pain x6 weeks. 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 Billie Lade  as Dr. Sharlett Iles has retired. Procedure discussed in detail with the patient and his wife and they are agreeable to proceed Patient has Pepcid at home and will use Pepcid 20 mg twice daily  Addendum: Reviewed and agree with initial management. Jerene Bears, MD

## 2014-05-03 NOTE — Patient Instructions (Signed)
You have been scheduled for an endoscopy with propofol. Please follow written instructions given to you at your visit today. If you use inhalers (even only as needed), please bring them with you on the day of your procedure. Your physician has requested that you go to www.startemmi.com and enter the access code given to you at your visit today. This web site gives a general overview about your procedure. However, you should still follow specific instructions given to you by our office regarding your preparation for the procedure. Take pepcid or zantac twice daily  CC:  Crist Infante MD

## 2014-05-04 ENCOUNTER — Encounter: Payer: Self-pay | Admitting: Internal Medicine

## 2014-05-04 ENCOUNTER — Ambulatory Visit: Payer: Medicare Other | Admitting: Internal Medicine

## 2014-05-04 VITALS — BP 135/82 | HR 75 | Temp 96.2°F | Resp 18 | Ht 67.0 in | Wt 175.0 lb

## 2014-05-04 DIAGNOSIS — K3189 Other diseases of stomach and duodenum: Secondary | ICD-10-CM

## 2014-05-04 DIAGNOSIS — C801 Malignant (primary) neoplasm, unspecified: Secondary | ICD-10-CM

## 2014-05-04 DIAGNOSIS — C189 Malignant neoplasm of colon, unspecified: Secondary | ICD-10-CM

## 2014-05-04 DIAGNOSIS — R933 Abnormal findings on diagnostic imaging of other parts of digestive tract: Secondary | ICD-10-CM

## 2014-05-04 HISTORY — DX: Malignant (primary) neoplasm, unspecified: C80.1

## 2014-05-04 LAB — GLUCOSE, CAPILLARY
Glucose-Capillary: 115 mg/dL — ABNORMAL HIGH (ref 70–99)
Glucose-Capillary: 106 mg/dL — ABNORMAL HIGH (ref 70–99)

## 2014-05-04 MED ORDER — PANTOPRAZOLE SODIUM 40 MG PO TBEC: 40.0000 mg | DELAYED_RELEASE_TABLET | Freq: Every day | ORAL | Status: DC

## 2014-05-04 MED ORDER — SODIUM CHLORIDE 0.9 % IV SOLN: 500.0000 mL | INTRAVENOUS | Status: DC

## 2014-05-04 NOTE — Patient Instructions (Signed)
YOU HAD AN ENDOSCOPIC PROCEDURE TODAY AT THE [facility]: Refer to the procedure report that was given to you for any specific questions about what was found during the examination.  If the procedure report does not answer your questions, please call your gastroenterologist to clarify.  If you requested that your care partner not be given the details of your procedure findings, then the procedure report has been included in a sealed envelope for you to review at your convenience later.  YOU SHOULD EXPECT: Some feelings of bloating in the abdomen. Passage of more gas than usual.  Walking can help get rid of the air that was put into your GI tract during the procedure and reduce the bloating. If you had a lower endoscopy (such as a colonoscopy or flexible sigmoidoscopy) you may notice spotting of blood in your stool or on the toilet paper. If you underwent a bowel prep for your procedure, then you may not have a normal bowel movement for a few days.  DIET: Your first meal following the procedure should be a light meal and then it is ok to progress to your normal diet.  A half-sandwich or bowl of soup is an example of a good first meal.  Heavy or fried foods are harder to digest and may make you feel nauseous or bloated.  Likewise meals heavy in dairy and vegetables can cause extra gas to form and this can also increase the bloating.  Drink plenty of fluids but you should avoid alcoholic beverages for 24 hours.  ACTIVITY: Your care partner should take you home directly after the procedure.  You should plan to take it easy, moving slowly for the rest of the day.  You can resume normal activity the day after the procedure however you should NOT DRIVE or use heavy machinery for 24 hours (because of the sedation medicines used during the test).    SYMPTOMS TO REPORT IMMEDIATELY: A gastroenterologist can be reached at any hour.  During normal business hours, 8:30 AM to 5:00 PM Monday through Friday,  call [phone].  After hours and on weekends, please call the GI answering service at [phone] who will take a message and have the physician on call contact you.   Following upper endoscopy (EGD)  Vomiting of blood or coffee ground material  New chest pain or pain under the shoulder blades  Painful or persistently difficult swallowing  New shortness of breath  Fever of 100F or higher  Black, tarry-looking stools  FOLLOW UP: If any biopsies were taken you will be contacted by phone or by letter within the next 1-3 weeks.  Call your gastroenterologist if you have not heard about the biopsies in 3 weeks.  Our staff will call the home number listed on your records the next business day following your procedure to check on you and address any questions or concerns that you may have at that time regarding the information given to you following your procedure. This is a courtesy call and so if there is no answer at the home number and we have not heard from you through the emergency physician on call, we will assume that you have returned to your regular daily activities without incident.  SIGNATURES/CONFIDENTIALITY: You and/or your care partner have signed paperwork which will be entered into your electronic medical record.  These signatures attest to the fact that that the information above on your After Visit Summary has been reviewed and is understood.  Full responsibility   of the confidentiality of this discharge information lies with you and/or your care-partner.  Esophagitis, Hiatal hernia-handouts given  Pantoprazole 40 mg daily 30 mins before 1st meal of the day.  Wait biospy results.

## 2014-05-04 NOTE — Progress Notes (Signed)
Called to room to assist during endoscopic procedure.  Patient ID and intended procedure confirmed with present staff. Received instructions for my participation in the procedure from the performing physician.  

## 2014-05-04 NOTE — Op Note (Signed)
[place]  [address] [place], [id number]   ENDOSCOPY PROCEDURE REPORT  PATIENT: Isabella, Ida  MR#: [id number] BIRTHDATE: 09/24/1941 , 5  yrs. old GENDER: Male ENDOSCOPIST: Jerene Bears, MD REFERRED BY:  [name], PA-C Arturo Morton, M.D. PROCEDURE DATE:  05/04/2014 PROCEDURE:  EGD w/ biopsy ASA CLASS:     Class III INDICATIONS:  Epigastric pain.   abnormal CT of the GI tract. history of colon cancer.  history of lung cancer MEDICATIONS: MAC sedation, administered by CRNA and Propofol (Diprivan) 120 mg IV TOPICAL ANESTHETIC: Cetacaine Spray  DESCRIPTION OF PROCEDURE: After the risks benefits and alternatives of the procedure were thoroughly explained, informed consent was obtained.  The LB [id number] endoscope was introduced through the mouth and advanced to the second portion of the duodenum. Without limitations.  The instrument was slowly withdrawn as the mucosa was fully examined.   ESOPHAGUS: Reflux esophagitis was found at the gastroesophageal junction.  Esophagitis was LA Grade A: breaks across 1-2 folds, <5 mm.   The esophagus was otherwise normal.   A 2 cm hiatal hernia was noted.  STOMACH: A firm mass measuring 5 X 5cm in size was found in the gastric antrum.  There was edema and erythema, and though difficult to determine completely, may be primarily submucosal.  The mass has a bluish color.  Multiple biopsies were performed using cold forceps.  Sample sent for histology.  DUODENUM: The duodenal mucosa showed no abnormalities in the bulb and second portion of the duodenum.  Retroflexed views revealed a hiatal hernia.     The scope was then withdrawn from the patient and the procedure completed.  COMPLICATIONS: There were no complications.  ENDOSCOPIC IMPRESSION: 1.   Mild esophagitis consistent with reflux esophagitis at the gastroesophageal junction 2.   The esophagus was otherwise normal. 3.   2 cm hiatal hernia 4.    Mass measuring 5 X 5cm in size was found in the gastric antrum as described above; multiple biopsies 5.   The duodenal mucosa showed no abnormalities in the bulb and second portion of the duodenum  RECOMMENDATIONS: 1.  Await biopsy results 2.  Pantoprazole 40 mg once daily 30 minutes before 1st meal of the day  eSigned:  Jerene Bears, MD 05/04/2014 2:03 PM      CC:The Patient, Kavin Leech, MD, and Crist Infante, MD

## 2014-05-04 NOTE — Progress Notes (Signed)
Report to pacu rn, vss, bbs=clear 

## 2014-05-05 ENCOUNTER — Telehealth: Payer: Self-pay | Admitting: Oncology

## 2014-05-05 ENCOUNTER — Telehealth: Payer: Self-pay

## 2014-05-05 ENCOUNTER — Telehealth: Payer: Self-pay | Admitting: *Deleted

## 2014-05-05 ENCOUNTER — Telehealth: Payer: Self-pay | Admitting: Internal Medicine

## 2014-05-05 NOTE — Telephone Encounter (Signed)
Spoke to patient by phone, see Result note

## 2014-05-05 NOTE — Telephone Encounter (Signed)
Left message on answering machine. 

## 2014-05-05 NOTE — Telephone Encounter (Signed)
added f/u w/LT for 5/15. d/t/LT/pt aware per 5/13 pof.

## 2014-05-05 NOTE — Telephone Encounter (Signed)
Order received for office visit 05/07/14. Left message on voicemail for pt to call office.  Pt returned call, appt given for 5/15 at 2:15.

## 2014-05-07 ENCOUNTER — Telehealth: Payer: Self-pay | Admitting: Oncology

## 2014-05-07 ENCOUNTER — Other Ambulatory Visit: Payer: Self-pay

## 2014-05-07 ENCOUNTER — Ambulatory Visit (HOSPITAL_BASED_OUTPATIENT_CLINIC_OR_DEPARTMENT_OTHER): Payer: Medicare Other | Admitting: Nurse Practitioner

## 2014-05-07 VITALS — BP 139/73 | HR 90 | Temp 97.9°F | Resp 17 | Ht 67.0 in | Wt 173.5 lb

## 2014-05-07 DIAGNOSIS — C189 Malignant neoplasm of colon, unspecified: Secondary | ICD-10-CM

## 2014-05-07 DIAGNOSIS — R109 Unspecified abdominal pain: Secondary | ICD-10-CM

## 2014-05-07 DIAGNOSIS — G620 Drug-induced polyneuropathy: Secondary | ICD-10-CM

## 2014-05-07 DIAGNOSIS — R82998 Other abnormal findings in urine: Secondary | ICD-10-CM

## 2014-05-07 DIAGNOSIS — C185 Malignant neoplasm of splenic flexure: Secondary | ICD-10-CM

## 2014-05-07 DIAGNOSIS — C786 Secondary malignant neoplasm of retroperitoneum and peritoneum: Secondary | ICD-10-CM

## 2014-05-07 MED ORDER — LORAZEPAM 0.5 MG PO TABS: ORAL_TABLET | ORAL | Status: DC

## 2014-05-07 MED ORDER — LORAZEPAM 0.5 MG PO TABS: ORAL_TABLET | ORAL | Status: AC

## 2014-05-07 NOTE — Patient Instructions (Signed)
Panitumumab Solution for Injection What is this medicine? PANITUMUMAB (pan i TOOM ue mab) is a chemotherapy drug. It targets a specific protein within cancer cells and stops the cells from growing. It is used to treat colorectal cancer. This medicine may be used for other purposes; ask your health care provider or pharmacist if you have questions. COMMON BRAND NAME(S): Vectibix What should I tell my health care provider before I take this medicine? They need to know if you have any of these conditions: -lung disease, especially lung fibrosis -skin conditions or sensitivity -an unusual or allergic reaction to panitumumab, mouse proteins, other medicines, foods, dyes, or preservatives -pregnant or trying to get pregnant -breast-feeding How should I use this medicine? This drug is given as an infusion into a vein. It is administered in a hospital or clinic by a specially trained health care professional. Talk to your pediatrician regarding the use of this medicine in children. Special care may be needed. Overdosage: If you think you have taken too much of this medicine contact a poison control center or emergency room at once. NOTE: This medicine is only for you. Do not share this medicine with others. What if I miss a dose? It is important not to miss your dose. Call your doctor or health care professional if you are unable to keep an appointment. What may interact with this medicine? -some medicines for cancer This list may not describe all possible interactions. Give your health care provider a list of all the medicines, herbs, non-prescription drugs, or dietary supplements you use. Also tell them if you smoke, drink alcohol, or use illegal drugs. Some items may interact with your medicine. What should I watch for while using this medicine? Visit your doctor for checks on your progress. This drug may make you feel generally unwell. This is not uncommon, as chemotherapy can affect healthy cells  as well as cancer cells. Report any side effects. Continue your course of treatment even though you feel ill unless your doctor tells you to stop. This medicine can make you more sensitive to the sun. Keep out of the sun while receiving this medicine and for 2 months after the last dose. If you cannot avoid being in the sun, wear protective clothing and use sunscreen. Do not use sun lamps or tanning beds/booths. In some cases, you may be given additional medicines to help with side effects. Follow all directions for their use. Call your doctor or health care professional for advice if you get a fever, chills or sore throat, or other symptoms of a cold or flu. Do not treat yourself. This drug decreases your body's ability to fight infections. Try to avoid being around people who are sick. Avoid taking products that contain aspirin, acetaminophen, ibuprofen, naproxen, or ketoprofen unless instructed by your doctor. These medicines may hide a fever. Do not become pregnant while taking this medicine and for 6 months after the last dose. Women should inform their doctor if they wish to become pregnant or think they might be pregnant. Men should not father a child while taking this medicine and for 6 months after the last dose. There is a potential for serious side effects to an unborn child. Talk to your health care professional or pharmacist for more information. Do not breast-feed an infant while taking this medicine. What side effects may I notice from receiving this medicine? Side effects that you should report to your doctor or health care professional as soon as possible: -allergic reactions like  skin rash, itching or hives, swelling of the face, lips, or tongue -breathing problems -changes in vision -fast, irregular heartbeat -feeling faint or lightheaded, falls -fever, chills -mouth sores -swelling of the ankles, feet, hands -unusually weak or tired Side effects that usually do not require  medical attention (report to your doctor or health care professional if they continue or are bothersome): -changes in skin like acne, cracks, skin dryness -constipation -diarrhea -eyelash growth -headache -nail changes -nausea, vomiting -stomach upset This list may not describe all possible side effects. Call your doctor for medical advice about side effects. You may report side effects to FDA at 1-800-FDA-1088. Where should I keep my medicine? This drug is given in a hospital or clinic and will not be stored at home. NOTE: This sheet is a summary. It may not cover all possible information. If you have questions about this medicine, talk to your doctor, pharmacist, or health care provider.  2014, Elsevier/Gold Standard. (2011-05-08 11:00:12)  Irinotecan injection What is this medicine? IRINOTECAN (ir in oh TEE kan ) is a chemotherapy drug. It is used to treat colon and rectal cancer. This medicine may be used for other purposes; ask your health care provider or pharmacist if you have questions. COMMON BRAND NAME(S): Camptosar What should I tell my health care provider before I take this medicine? They need to know if you have any of these conditions: -blood disorders -dehydration -diarrhea -infection (especially a virus infection such as chickenpox, cold sores, or herpes) -liver disease -low blood counts, like low white cell, platelet, or red cell counts -recent or ongoing radiation therapy -an unusual or allergic reaction to irinotecan, sorbitol, other chemotherapy, other medicines, foods, dyes, or preservatives -pregnant or trying to get pregnant -breast-feeding How should I use this medicine? This drug is given as an infusion into a vein. It is administered in a hospital or clinic by a specially trained health care professional. Talk to your pediatrician regarding the use of this medicine in children. Special care may be needed. Overdosage: If you think you have taken too much  of this medicine contact a poison control center or emergency room at once. NOTE: This medicine is only for you. Do not share this medicine with others. What if I miss a dose? It is important not to miss your dose. Call your doctor or health care professional if you are unable to keep an appointment. What may interact with this medicine? Do not take this medicine with any of the following medications: -atazanavir -ketoconazole -St. John's Wort This medicine may also interact with the following medications: -dexamethasone -diuretics -laxatives -medicines for seizures like carbamazepine, mephobarbital, phenobarbital, phenytoin, primidone -medicines to increase blood counts like filgrastim, pegfilgrastim, sargramostim -prochlorperazine -vaccines This list may not describe all possible interactions. Give your health care provider a list of all the medicines, herbs, non-prescription drugs, or dietary supplements you use. Also tell them if you smoke, drink alcohol, or use illegal drugs. Some items may interact with your medicine. What should I watch for while using this medicine? Your condition will be monitored carefully while you are receiving this medicine. You will need important blood work done while you are taking this medicine. This drug may make you feel generally unwell. This is not uncommon, as chemotherapy can affect healthy cells as well as cancer cells. Report any side effects. Continue your course of treatment even though you feel ill unless your doctor tells you to stop. In some cases, you may be given additional medicines to  help with side effects. Follow all directions for their use. You may get drowsy or dizzy. Do not drive, use machinery, or do anything that needs mental alertness until you know how this medicine affects you. Do not stand or sit up quickly, especially if you are an older patient. This reduces the risk of dizzy or fainting spells. Call your doctor or health care  professional for advice if you get a fever, chills or sore throat, or other symptoms of a cold or flu. Do not treat yourself. This drug decreases your body's ability to fight infections. Try to avoid being around people who are sick. This medicine may increase your risk to bruise or bleed. Call your doctor or health care professional if you notice any unusual bleeding. Be careful brushing and flossing your teeth or using a toothpick because you may get an infection or bleed more easily. If you have any dental work done, tell your dentist you are receiving this medicine. Avoid taking products that contain aspirin, acetaminophen, ibuprofen, naproxen, or ketoprofen unless instructed by your doctor. These medicines may hide a fever. Do not become pregnant while taking this medicine. Women should inform their doctor if they wish to become pregnant or think they might be pregnant. There is a potential for serious side effects to an unborn child. Talk to your health care professional or pharmacist for more information. Do not breast-feed an infant while taking this medicine. What side effects may I notice from receiving this medicine? Side effects that you should report to your doctor or health care professional as soon as possible: -allergic reactions like skin rash, itching or hives, swelling of the face, lips, or tongue -low blood counts - this medicine may decrease the number of white blood cells, red blood cells and platelets. You may be at increased risk for infections and bleeding. -signs of infection - fever or chills, cough, sore throat, pain or difficulty passing urine -signs of decreased platelets or bleeding - bruising, pinpoint red spots on the skin, black, tarry stools, blood in the urine -signs of decreased red blood cells - unusually weak or tired, fainting spells, lightheadedness -breathing problems -chest pain -diarrhea -feeling faint or lightheaded, falls -flushing, runny nose, sweating  during infusion -mouth sores or pain -pain, swelling, redness or irritation where injected -pain, swelling, warmth in the leg -pain, tingling, numbness in the hands or feet -problems with balance, talking, walking -stomach cramps, pain -trouble passing urine or change in the amount of urine -vomiting as to be unable to hold down drinks or food -yellowing of the eyes or skin Side effects that usually do not require medical attention (report to your doctor or health care professional if they continue or are bothersome): -constipation -hair loss -headache -loss of appetite -nausea, vomiting -stomach upset This list may not describe all possible side effects. Call your doctor for medical advice about side effects. You may report side effects to FDA at 1-800-FDA-1088. Where should I keep my medicine? This drug is given in a hospital or clinic and will not be stored at home. NOTE: This sheet is a summary. It may not cover all possible information. If you have questions about this medicine, talk to your doctor, pharmacist, or health care provider.  2014, Elsevier/Gold Standard. (2008-04-27 16:29:12)  Capecitabine tablets What is this medicine? CAPECITABINE (ka pe SITE a been) is a chemotherapy drug. It slows the growth of cancer cells. This medicine is used to treat breast cancer, and also colon or rectal cancer.  This medicine may be used for other purposes; ask your health care provider or pharmacist if you have questions. COMMON BRAND NAME(S): Xeloda What should I tell my health care provider before I take this medicine? They need to know if you have any of these conditions: -bleeding or blood disorders -dihydropyrimidine dehydrogenase (DPD) deficiency -heart disease -infection (especially a virus infection such as chickenpox, cold sores, or herpes) -kidney disease -liver disease -an unusual or allergic reaction to capecitabine, 5-fluorouracil, other medicines, foods, dyes, or  preservatives -pregnant or trying to get pregnant -breast-feeding How should I use this medicine? Take this medicine by mouth with a glass of water, within 30 minutes of the end of a meal. Follow the directions on the prescription label. Take your medicine at regular intervals. Do not take it more often than directed. Do not stop taking except on your doctor's advice. Your doctor may want you to take a combination of 150 mg and 500 mg tablets for each dose. It is very important that you know how to correctly take your dose. Taking the wrong tablets could result in an overdose (too much medication) or underdose (too little medication). Talk to your pediatrician regarding the use of this medicine in children. Special care may be needed. Overdosage: If you think you have taken too much of this medicine contact a poison control center or emergency room at once. NOTE: This medicine is only for you. Do not share this medicine with others. What if I miss a dose? If you miss a dose, do not take the missed dose at all. Do not take double or extra doses. Instead, continue with your next scheduled dose and check with your doctor. What may interact with this medicine? -antacids with aluminum and/or magnesium -folic acid -leucovorin -medicines to increase blood counts like filgrastim, pegfilgrastim, sargramostim -phenytoin -vaccines -warfarin Talk to your doctor or health care professional before taking any of these medicines: -acetaminophen -aspirin -ibuprofen -ketoprofen -naproxen This list may not describe all possible interactions. Give your health care provider a list of all the medicines, herbs, non-prescription drugs, or dietary supplements you use. Also tell them if you smoke, drink alcohol, or use illegal drugs. Some items may interact with your medicine. What should I watch for while using this medicine? Visit your doctor for checks on your progress. This drug may make you feel generally  unwell. This is not uncommon, as chemotherapy can affect healthy cells as well as cancer cells. Report any side effects. Continue your course of treatment even though you feel ill unless your doctor tells you to stop. In some cases, you may be given additional medicines to help with side effects. Follow all directions for their use. Call your doctor or health care professional for advice if you get a fever, chills or sore throat, or other symptoms of a cold or flu. Do not treat yourself. This drug decreases your body's ability to fight infections. Try to avoid being around people who are sick. This medicine may increase your risk to bruise or bleed. Call your doctor or health care professional if you notice any unusual bleeding. Be careful brushing and flossing your teeth or using a toothpick because you may get an infection or bleed more easily. If you have any dental work done, tell your dentist you are receiving this medicine. Avoid taking products that contain aspirin, acetaminophen, ibuprofen, naproxen, or ketoprofen unless instructed by your doctor. These medicines may hide a fever. Do not become pregnant while taking this medicine.  Women should inform their doctor if they wish to become pregnant or think they might be pregnant. There is a potential for serious side effects to an unborn child. Talk to your health care professional or pharmacist for more information. Do not breast-feed an infant while taking this medicine. Men are advised not to father a child while taking this medicine. What side effects may I notice from receiving this medicine? Side effects that you should report to your doctor or health care professional as soon as possible: -allergic reactions like skin rash, itching or hives, swelling of the face, lips, or tongue -low blood counts - this medicine may decrease the number of white blood cells, red blood cells and platelets. You may be at increased risk for infections and  bleeding. -signs of infection - fever or chills, cough, sore throat, pain or difficulty passing urine -signs of decreased platelets or bleeding - bruising, pinpoint red spots on the skin, black, tarry stools, blood in the urine -signs of decreased red blood cells - unusually weak or tired, fainting spells, lightheadedness -breathing problems -changes in vision -chest pain -diarrhea of more than 4 bowel movements in one day or any diarrhea at night -mouth sores -nausea and vomiting -pain, swelling, redness at site where injected -pain, tingling, numbness in the hands or feet -redness, swelling, or sores on hands or feet -stomach pain -vomiting -yellow color of skin or eyes Side effects that usually do not require medical attention (report to your doctor or health care professional if they continue or are bothersome): -constipation -diarrhea -dry or itchy skin -hair loss -loss of appetite -nausea -weak or tired This list may not describe all possible side effects. Call your doctor for medical advice about side effects. You may report side effects to FDA at 1-800-FDA-1088. Where should I keep my medicine? Keep out of the reach of children. Store at room temperature between 15 and 30 degrees C (59 and 86 degrees F). Keep container tightly closed. Throw away any unused medicine after the expiration date. NOTE: This sheet is a summary. It may not cover all possible information. If you have questions about this medicine, talk to your doctor, pharmacist, or health care provider.  2014, Elsevier/Gold Standard. (2008-11-22 11:47:41)

## 2014-05-07 NOTE — Progress Notes (Addendum)
[facility] OFFICE PROGRESS NOTE   Diagnosis:  Metastatic colon cancer.  INTERVAL HISTORY:   Mr. Bynum returns as scheduled. At the time of his last visit he was experiencing mild intermittent abdominal pain. This worsened and he was referred for CT scans of the chest, abdomen and pelvis by Dr. Joylene Draft on 04/29/2014 with findings of an enlarging stomach mass involving the anterior wall in the antral region measuring approximately 5 cm. The mass was narrowing the gastric lumen. Peritoneal implants in the left upper quadrant were noted to be slightly more enlarged.  He underwent an upper endoscopy by Dr. Hilarie Fredrickson on 05/04/2014 with findings of a 5 x 5 cm mass in the gastric antrum, mild esophagitis consistent with reflux esophagitis at the gastroesophageal junction and a 2 cm hiatal hernia. 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 Immunostains could not be performed.  Mr. Mazzocco continues to have intermittent upper abdominal pain that "comes and goes". The pain seems to be occurring more frequently and lasting longer. He has noted some improvement since beginning a proton pump inhibitor. He denies dysphagia. No regurgitation. No nausea or vomiting. Bowels are moving. He continues to have mild numbness in the fingertips. This does not interfere with activity.  His wife accompanies him to today's visit. She has been diagnosed with breast cancer and is scheduled for Port-A-Cath placement and initiation of chemotherapy in the near future.  Objective:  Vital signs in last 24 hours:  Blood pressure 139/73, pulse 90, temperature 97.9 F (36.6 C), temperature source Oral, resp. rate 17, height 5' 7"  (1.702 m), weight 173 lb 8 oz (78.699 kg), SpO2 96.00%.    HEENT: No thrush or ulcerations. Lymphatics: No palpable cervical, supraclavicular or axillary lymph nodes. Resp: Lungs are clear. Cardio: Regular cardiac rhythm. GI: Abdomen soft and nontender. No mass. No  organomegaly. Vascular: No leg edema.   Port-A-Cath site is without erythema.    Lab Results:  Lab Results  Component Value Date   WBC 10.5* 03/31/2014   HGB 16.0 03/31/2014   HCT 48.1 03/31/2014   MCV 94.2 03/31/2014   PLT 187 03/31/2014   NEUTROABS 7.9* 03/31/2014    Imaging:  No results found.  Medications: I have reviewed the patient's current medications.  Assessment/Plan: 1. 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   Initially diagnosed with stage III colon cancer July 2012 status post left colectomy 07/09/2011.   K-ras mutation not detected on initial testing. Extended testing returned negative for the K-ras mutation.   Adjuvant Xeloda initiated July 2012.  Rise in the CEA tumor marker and 2 intra-abdominal soft tissue densities on CT scan November 2012 while on adjuvant Xeloda chemotherapy.   Oxaliplatin and Avastin were added to the regimen beginning 11/26/2011 with a decrease in the CEA and continued through 04/23/2012. Oxaliplatin discontinued at that time due to cumulative neurotoxicity.   Xeloda and Avastin continued.   Restaging CT evaluation 04/09/2013 showed an increase in the previously noted peritoneal implants.   FOLFIRI initiated 04/29/2013. Irinotecan dose reduced following cycle 1 due to mucositis.   Avastin added beginning with cycle 3.   Restaging CT evaluation 07/27/2013 (after 6 cycles) showed interval resolution of a previously described left lower lobe nodule. Interval improvement in peritoneal nodularity. He completed a total of 14 cycles through 11/23/2013. Treatment was adjusted to [facility] following an office visit 12/07/2013 due to a significant decline in his performance status.  Avastin placed on hold beginning 03/31/2014 due to increased urine protein.  24-hour urine on 04/02/2014 showed 814 mg of protein.  03/31/2014 CEA mildly increased at 20.3 as compared to 15.6 on 03/03/2014 and 10.4 on 02/03/2014.  Xeloda placed on  hold beginning 04/14/2014 pending upcoming CT scans.  CT scan chest/abdomen/pelvis 04/29/2014 showed an enlarging stomach mass involving the anterior wall in the antral region measuring approximately 5 cm. The mass was narrowing the gastric lumen. Peritoneal implants in the left upper quadrant were noted to be slightly more enlarged.  Upper endoscopy 05/04/2014 with findings of a 5 x 5 cm mass in the gastric antrum. Biopsy showed metastatic adenocarcinoma. 2. Status post Port-A-Cath placement 04/27/2013. 3. Status post resection of a stage I non-small cell lung cancer left upper lung with postop brachytherapy June 2007. 4. 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 Radiographic complete regression on Avastin based chemotherapy. 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 6. Type 2 diabetes. 7. History of nephrolithiasis. 8. Status post bilateral corneal transplants. 9. Increased urine protein on Avastin. 10. Intermittent upper abdominal pain secondary to gastric mass.   Disposition: Mr. Amos has a biopsy proven metastatic disease involving the gastric antrum. He understands this most likely represents metastatic colorectal cancer. He is symptomatic with abdominal pain.   Dr. Benay Spice reviewed treatment options with Mr. Palmatier to include systemic therapy with irinotecan/Panitumumab or an oxaliplatin-based regimen versus a referral for radiation versus referral for a clinical trial versus continued observation with symptom management.  Mr. Schroth is most comfortable with a referral to radiation oncology. Dr. Benay Spice recommends concurrent Xeloda as a radiosensitizer to be taken at a dose of 1500 mg twice daily on the days of radiation.  We will obtain baseline labs to include a CBC, chemistry panel and CEA on 05/12/2014 when he is here for the  radiation oncology consult.  He will return for a followup visit on 06/03/2014. He will contact the office in the interim with any problems.   Patient seen with Dr. Benay Spice. 35 minutes were spent face-to-face at today's visit with the majority of that time involved in counseling/coordination of care.   Owens Shark ANP/GNP-BC   05/07/2014  4:33 PM  This was a shared visit with Ned Card. Mr. Taite was interviewed and examined. He appears to have metastatic colon cancer involving the gastric antrum. He has a low volume of systemic disease and has symptoms on the related to the gastric mass. We discussed treatment options including palliative gastric radiation and systemic therapy.  We specifically discussed irinotecan/panitumumab and repeat treatment with oxaliplatin-based therapy.  Mr. Kamphaus would like to proceed with palliative gastric radiation and consider systemic options a later date. We will make a referral to Dr. Lisbeth Renshaw. I recommend capecitabine to be given concurrent with palliative radiation.  Julieanne Manson, M.D.

## 2014-05-07 NOTE — Telephone Encounter (Signed)
Ativan called in originally entered as 30 tabs, changed to 60 tabs. Was called in under 60 tablets to pharmacy per Ned Card, NP

## 2014-05-07 NOTE — Telephone Encounter (Signed)
gv adn printed appt sched and avs for pt for May and June °

## 2014-05-07 NOTE — Progress Notes (Signed)
Met with [name] and family. Explained role of nurse navigator.  [place] resources provided to patient, including SW service and support group information.  Contact names and phone numbers were provided for entire [facility] team.  Teach back method was used.  No barriers to care identified.  Will continue to follow as needed.

## 2014-05-07 NOTE — Telephone Encounter (Signed)
gv and printed appts ched and avs for pt for May and June

## 2014-05-10 ENCOUNTER — Telehealth: Payer: Self-pay | Admitting: *Deleted

## 2014-05-10 NOTE — Telephone Encounter (Signed)
Pt reports "Please let MD know I have 32 tablets of the Xeloda left"  Pt has radiation consult 05/12/14 to determine if/when he will start radiation.  Pt has been off Xeloda for approximately 1 month. Note to MD.

## 2014-05-11 ENCOUNTER — Encounter: Payer: Self-pay | Admitting: Radiation Oncology

## 2014-05-11 NOTE — Progress Notes (Signed)
GI Location of Tumor / Histology:mass  gastric antrum  Patient presented  months ago with symptoms of: mild abdominal pain referred for Ct scan 04/29/14 5 cm mass   Biopsies of  (if applicable) revealed: Diagnosis 05/04/2014:Stomach, biopsy, gastric antrum, biopsy- GASTRIC ANTRAL TYPE MUCOSA WITH PATCHY SUBMUCOSAL MALIGNANT GLANDULAR CELL INFILTRATES, CONSISTENT WITH ADENOCARCINOMA. Metastatic  Past/Anticipated interventions by surgeon, if any: upper endoscopy by Dr. Hilarie Fredrickson 05/04/14 bx done   Past/Anticipated interventions by medical oncology, if any: Adjuvant Xeloda initiated July 2012. Rise in the CEA tumor marker and 2 intra-abdominal soft tissue densities on CT scan November 2012 while on adjuvant Xeloda chemotherapy. Oxaliplatin and Avastin were added to the regimen beginning 11/26/2011 with a decrease in the CEA and continued through 04/23/2012. Oxaliplatin discontinued at that time due to cumulative neurotoxicity. Xeloda and Avastin continued. Restaging CT evaluation 04/09/2013 showed an increase in the previously noted peritoneal implants. FOLFIRI initiated 04/29/2013. Irinotecan dose reduced following cycle 1 due to mucositis. Avastin added beginning with cycle 3. Restaging CT evaluation 07/27/2013 (after 6 cycles) showed interval resolution of a previously described left lower lobe nodule. Interval improvement in peritoneal nodularity. He completed a total of 14 cycles through 11/23/2013. Treatment was adjusted to [facility] following an office visit 12/07/2013 due to a significant decline in his performance status. Avastin placed on hold beginning 03/31/2014 due to increased urine protein. 24-hour urine on 04/02/2014 showed 814 mg of protein. 03/31/2014 CEA mildly increased at 20.3 as compared to 15.6 on 03/03/2014 and 10.4 on 02/03/2014. Dr.Sherrill, 05/07/14  Palliative gastric  Radiation per patient preference and to consider systemic options at a later date, follow up appt 06/03/14 with  Dr.Sherrill   Weight changes, if any: no  Bowel/Bladder complaints, if any: diarrhea at times,takes imodium prn,  Nausea / Vomiting, if any: No  Pain issues, if any:  Intermittent upper abdominal pain   SAFETY ISSUES:  Prior radiation? Yes, Left upper Lung seed implantation  With wedge resection 05/28/2006 Dr.  Rodman Key Manning/Dr. Marlyn Corporal  Pacemaker/ICD? No  Is the patient on methotrexate? no  1. 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  Initially diagnosed with stage III colon cancer July 2012 status post left colectomy 07/09/2011.  K-ras mutation not detected on initial testing. Extended testing returned negative for the K-ras mutation.  Adjuvant Xeloda initiated July 2012. Mother esophagus ca, father lung ca, smoker both deceased, paternal aunt breast ca, deceased,

## 2014-05-12 ENCOUNTER — Ambulatory Visit: Payer: Medicare Other

## 2014-05-12 ENCOUNTER — Ambulatory Visit
Admission: RE | Admit: 2014-05-12 | Discharge: 2014-05-12 | Disposition: A | Discharge status: Home / Self Care | Payer: Medicare Other | Source: Ambulatory Visit | Attending: Radiation Oncology | Admitting: Radiation Oncology

## 2014-05-12 ENCOUNTER — Other Ambulatory Visit (HOSPITAL_BASED_OUTPATIENT_CLINIC_OR_DEPARTMENT_OTHER): Payer: Medicare Other

## 2014-05-12 ENCOUNTER — Encounter: Payer: Self-pay | Admitting: Radiation Oncology

## 2014-05-12 ENCOUNTER — Ambulatory Visit: Payer: Medicare Other | Admitting: Radiation Oncology

## 2014-05-12 VITALS — BP 155/84 | HR 81 | Temp 97.7°F | Resp 20 | Ht 67.0 in | Wt 177.1 lb

## 2014-05-12 DIAGNOSIS — Z85118 Personal history of other malignant neoplasm of bronchus and lung: Secondary | ICD-10-CM | POA: Insufficient documentation

## 2014-05-12 DIAGNOSIS — R109 Unspecified abdominal pain: Secondary | ICD-10-CM | POA: Insufficient documentation

## 2014-05-12 DIAGNOSIS — C185 Malignant neoplasm of splenic flexure: Secondary | ICD-10-CM

## 2014-05-12 DIAGNOSIS — C786 Secondary malignant neoplasm of retroperitoneum and peritoneum: Secondary | ICD-10-CM | POA: Insufficient documentation

## 2014-05-12 DIAGNOSIS — E119 Type 2 diabetes mellitus without complications: Secondary | ICD-10-CM | POA: Insufficient documentation

## 2014-05-12 DIAGNOSIS — H409 Unspecified glaucoma: Secondary | ICD-10-CM | POA: Insufficient documentation

## 2014-05-12 DIAGNOSIS — J449 Chronic obstructive pulmonary disease, unspecified: Secondary | ICD-10-CM | POA: Insufficient documentation

## 2014-05-12 DIAGNOSIS — C189 Malignant neoplasm of colon, unspecified: Secondary | ICD-10-CM

## 2014-05-12 DIAGNOSIS — Z87891 Personal history of nicotine dependence: Secondary | ICD-10-CM | POA: Insufficient documentation

## 2014-05-12 DIAGNOSIS — E785 Hyperlipidemia, unspecified: Secondary | ICD-10-CM | POA: Insufficient documentation

## 2014-05-12 DIAGNOSIS — Z9049 Acquired absence of other specified parts of digestive tract: Secondary | ICD-10-CM | POA: Insufficient documentation

## 2014-05-12 DIAGNOSIS — C169 Malignant neoplasm of stomach, unspecified: Secondary | ICD-10-CM

## 2014-05-12 DIAGNOSIS — J4489 Other specified chronic obstructive pulmonary disease: Secondary | ICD-10-CM | POA: Insufficient documentation

## 2014-05-12 DIAGNOSIS — Z85038 Personal history of other malignant neoplasm of large intestine: Secondary | ICD-10-CM | POA: Insufficient documentation

## 2014-05-12 DIAGNOSIS — Z510 Encounter for antineoplastic radiation therapy: Secondary | ICD-10-CM | POA: Insufficient documentation

## 2014-05-12 DIAGNOSIS — C799 Secondary malignant neoplasm of unspecified site: Secondary | ICD-10-CM

## 2014-05-12 DIAGNOSIS — Z79899 Other long term (current) drug therapy: Secondary | ICD-10-CM | POA: Insufficient documentation

## 2014-05-12 DIAGNOSIS — C7889 Secondary malignant neoplasm of other digestive organs: Secondary | ICD-10-CM | POA: Insufficient documentation

## 2014-05-12 HISTORY — DX: Malignant (primary) neoplasm, unspecified: C80.1

## 2014-05-12 LAB — CBC WITH DIFFERENTIAL/PLATELET
BASO%: 0.7 % (ref 0.0–2.0)
BASOS ABS: 0.1 10*3/uL (ref 0.0–0.1)
EOS ABS: 0.4 10*3/uL (ref 0.0–0.5)
EOS%: 3.9 % (ref 0.0–7.0)
HEMATOCRIT: 49.5 % (ref 38.4–49.9)
HEMOGLOBIN: 16.1 g/dL (ref 13.0–17.1)
LYMPH%: 12.1 % — ABNORMAL LOW (ref 14.0–49.0)
MCH: 30 pg (ref 27.2–33.4)
MCHC: 32.5 g/dL (ref 32.0–36.0)
MCV: 92.5 fL (ref 79.3–98.0)
MONO#: 0.7 10*3/uL (ref 0.1–0.9)
MONO%: 6.6 % (ref 0.0–14.0)
NEUT%: 76.7 % — AB (ref 39.0–75.0)
NEUTROS ABS: 7.7 10*3/uL — AB (ref 1.5–6.5)
PLATELETS: 170 10*3/uL (ref 140–400)
RBC: 5.35 10*6/uL (ref 4.20–5.82)
RDW: 19.7 % — ABNORMAL HIGH (ref 11.0–14.6)
WBC: 10 10*3/uL (ref 4.0–10.3)
lymph#: 1.2 10*3/uL (ref 0.9–3.3)

## 2014-05-12 LAB — COMPREHENSIVE METABOLIC PANEL (CC13)
ALBUMIN: 3.2 g/dL — AB (ref 3.5–5.0)
ALK PHOS: 66 U/L (ref 40–150)
ALT: 9 U/L (ref 0–55)
AST: 11 U/L (ref 5–34)
Anion Gap: 11 mEq/L (ref 3–11)
BUN: 13.2 mg/dL (ref 7.0–26.0)
CALCIUM: 10.3 mg/dL (ref 8.4–10.4)
CO2: 25 mEq/L (ref 22–29)
CREATININE: 1.3 mg/dL (ref 0.7–1.3)
Chloride: 107 mEq/L (ref 98–109)
GLUCOSE: 109 mg/dL (ref 70–140)
POTASSIUM: 4.1 meq/L (ref 3.5–5.1)
Sodium: 144 mEq/L (ref 136–145)
Total Bilirubin: 0.66 mg/dL (ref 0.20–1.20)
Total Protein: 6.4 g/dL (ref 6.4–8.3)

## 2014-05-12 NOTE — Progress Notes (Signed)
Please see the Nurse Progress Note in the MD Initial Consult Encounter for this patient. 

## 2014-05-13 ENCOUNTER — Ambulatory Visit: Payer: Medicare Other | Admitting: Radiation Oncology

## 2014-05-13 ENCOUNTER — Ambulatory Visit: Payer: Medicare Other

## 2014-05-13 DIAGNOSIS — C799 Secondary malignant neoplasm of unspecified site: Secondary | ICD-10-CM

## 2014-05-13 DIAGNOSIS — C189 Malignant neoplasm of colon, unspecified: Secondary | ICD-10-CM | POA: Insufficient documentation

## 2014-05-13 LAB — CEA: CEA: 32.4 ng/mL — AB (ref 0.0–5.0)

## 2014-05-13 MED ORDER — ONDANSETRON HCL 8 MG PO TABS: 8.0000 mg | ORAL_TABLET | Freq: Two times a day (BID) | ORAL | Status: DC | PRN

## 2014-05-14 ENCOUNTER — Ambulatory Visit
Admission: RE | Admit: 2014-05-14 | Discharge: 2014-05-14 | Disposition: A | Discharge status: Home / Self Care | Payer: Medicare Other | Source: Ambulatory Visit | Attending: Radiation Oncology | Admitting: Radiation Oncology

## 2014-05-14 DIAGNOSIS — C189 Malignant neoplasm of colon, unspecified: Secondary | ICD-10-CM

## 2014-05-14 DIAGNOSIS — C799 Secondary malignant neoplasm of unspecified site: Principal | ICD-10-CM

## 2014-05-14 NOTE — Progress Notes (Addendum)
Radiation Oncology         [phone] ________________________________  Name: [name] MRN: [id number]  Date: 05/12/2014  DOB: 03-30-1941  IR:SWNIOE,VOJJ A, MD  Ladell Pier, MD     REFERRING PHYSICIAN: Ladell Pier, MD   DIAGNOSIS: The primary encounter diagnosis was Colon cancer metastasized to multiple sites. Diagnoses of Colon adenocarcinoma and Metastasis from malignant tumor of colon were also pertinent to this visit.   HISTORY OF PRESENT ILLNESS::[name] is a 73 y.o. male who is seen for an initial consultation visit. The patient is seen today regarding his diagnosis of metastatic colon cancer. The patient was initially diagnosed with stage III colon cancer in 2012. He underwent a left colectomy and subsequently has undergone multiple rounds of systemic treatment. The patient has been noted to have metastatic disease with the presence of peritoneal implants. Recently he began to complain of mild occasional abdominal pain and this continued to worsen and the persistent. Next  The patient underwent a CT scan of the chest abdomen and pelvis. And enlarging mass within the anterior wall of the gastric antrum was seen area this measured approximately 5 cm and this appeared to be narrowed the gastric lumen. Some enlarging peritoneal implants were also identified. The patient underwent endoscopy with biopsy on 05/04/2014. A gastric mass was present within the antrum measuring 5 x 5 cm. Edema and erythema was present and multiple lab sees were obtained. These returned positive for adenocarcinoma and this is felt to be consistent with metastatic adenocarcinoma from the colon most likely. The patient does have a history of lung cancer but based on his recent history this is for more likely to represent metastatic colon cancer.  Given these findings and symptoms, I have been asked to see the patient for consideration of palliative radiation treatment.   PREVIOUS RADIATION THERAPY:  Yes the patient underwent resection of a stage I lung cancer in 2007 in conjunction with brachytherapy.   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     PAST SURGICAL HISTORY: Past Surgical History  Procedure Laterality Date  . 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 Lung removal, partial  05/2006    Lt upper lobe wedge resection  . Tonsillectomy    . Kidney stone removal  07/2006  . Colonoscopy    . Polypectomy    . Colon surgery  06/2011  . 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 Trigger finger release  08/29/2012    Procedure: RELEASE TRIGGER FINGER/A-1 PULLEY;  Surgeon: Cammie Sickle., MD;  Location: [place];  Service: Orthopedics;  Laterality: Left;  release a1 pulley left long  . Radioactive seed implantation Bilateral 05/28/2006    40seeds, lung left upper      FAMILY HISTORY: family history includes Asthma in his cousin and another family member; Cancer in his daughter and paternal aunt; Emphysema in his father; Esophageal cancer in his mother; Leukemia in his father; Lung cancer in his father; Ulcers in his brother.   SOCIAL HISTORY:  reports that he quit smoking  about 33 years ago. His smoking use included Cigarettes. He has a 50 pack-year smoking history. He has never used smokeless tobacco. He reports that he drinks about .6 ounces of alcohol per week. He reports that he does not use illicit drugs.   ALLERGIES: Review of patient's  allergies indicates no known allergies.   MEDICATIONS:  Current Outpatient Prescriptions  Medication Sig Dispense Refill  . aspirin 81 MG tablet Take 81 mg by mouth daily.       . Cholecalciferol (VITAMIN D3) 2000 UNITS TABS Take 1 tablet by mouth daily.       . cyanocobalamin 500 MCG tablet Take 1,000 mcg by mouth daily.       . dorzolamide-timolol (COSOPT) 22.3-6.8 MG/ML ophthalmic solution Place 1 drop into the left eye 2 (two) times daily.       Marland Kitchen ezetimibe-simvastatin (VYTORIN) 10-40 MG per tablet 0.5 tablets 3 (three) times a week. 1/2 tab 4-5 times per week      . loperamide (IMODIUM A-D) 2 MG tablet Take by mouth as needed for diarrhea or loose stools. Take 2 tablets at the first sign of diarrhea then take one every 2 hours. Take 2 tablets at bedtime. Stop when you have not had a loose stool in twelve hours.      Marland Kitchen loratadine (CLARITIN) 10 MG tablet Take 10 mg by mouth 2 (two) times daily.        Marland Kitchen LORazepam (ATIVAN) 0.5 MG tablet Take one to two at bedtime for sleep as needed  60 tablet  1  . loteprednol (LOTEMAX) 0.5 % ophthalmic suspension Place 1 drop into the left eye daily.       . metFORMIN (GLUMETZA) 1000 MG (MOD) 24 hr tablet Take 1,000 mg by mouth daily after supper.       . Omega-3 Fatty Acids (FISH OIL) 1000 MG CAPS Take 6 capsules by mouth daily.       . pantoprazole (PROTONIX) 40 MG tablet Take 1 tablet (40 mg total) by mouth daily.  30 tablet  3  . testosterone enanthate (DELATESTRYL) 200 MG/ML injection Inject 500 mg into the muscle every 21 ( twenty-one) days. Every 3 weeks currently      . amoxicillin (AMOXIL) 500 MG capsule       . HYDROcodone-acetaminophen (NORCO/VICODIN) 5-325 MG per tablet 1-2 po q 4h prn pain  75 tablet  0  . lidocaine (XYLOCAINE) 2 % solution 5% solution. 29mls gargle and spit out every 2 hours as needed  100 mL  4  . ondansetron (ZOFRAN) 8 MG tablet Take 1 tablet (8 mg total) by mouth every 12 (twelve) hours as needed for nausea.  30 tablet  1    . oxyCODONE-acetaminophen (PERCOCET/ROXICET) 5-325 MG per tablet        No current facility-administered medications for this encounter.     REVIEW OF SYSTEMS:  A 15 point review of systems is documented in the electronic medical record. This was obtained by the nursing staff. However, I reviewed this with the patient to discuss relevant findings and make appropriate changes.  Pertinent items are noted in HPI.    PHYSICAL EXAM:  height is 5\' 7"  (1.702 m) and weight is 177 lb 1.6 oz (80.332 kg). His oral temperature is 97.7 F (36.5 C). His blood pressure is 155/84 and his pulse is 81. His respiration is 20.   ECOG = 1  0 - Asymptomatic (Fully active, able to carry on all predisease activities without restriction)  1 - Symptomatic but completely ambulatory (Restricted in physically strenuous activity but ambulatory and able to carry out work of a light or sedentary nature. For example, light housework, office work)  2 - Symptomatic, <50% in bed during the day (Ambulatory and capable of all self care but unable to carry out any work activities. Up and about more than 50% of waking hours)  3 - Symptomatic, >50% in bed, but not bedbound (Capable of only limited self-care, confined to bed or chair 50% or more of waking hours)  4 - Bedbound (Completely disabled. Cannot carry on any self-care. Totally confined to bed or chair)  5 - Death   Eustace Pen MM, Creech RH, Tormey DC, et al. [phone]). "Toxicity and response criteria of the [facility]". [place] Oncol. 5 (6): 649-55  General: Well-developed, in no acute distress HEENT: Normocephalic, atraumatic; oral cavity clear Neck: Supple without any lymphadenopathy Cardiovascular: Regular rate and rhythm Respiratory: Clear to auscultation bilaterally GI: Soft, nontender, normal bowel sounds Extremities: No edema present Neuro: No focal deficits     LABORATORY DATA:  Lab Results  Component Value Date   WBC 10.0  05/12/2014   HGB 16.1 05/12/2014   HCT 49.5 05/12/2014   MCV 92.5 05/12/2014   PLT 170 05/12/2014   Lab Results  Component Value Date   NA 144 05/12/2014   K 4.1 05/12/2014   CL 108* 06/17/2013   CO2 25 05/12/2014   Lab Results  Component Value Date   ALT 9 05/12/2014   AST 11 05/12/2014   ALKPHOS 66 05/12/2014   BILITOT 0.66 05/12/2014      RADIOGRAPHY: Ct Chest W Contrast  04/29/2014   CLINICAL DATA:  History of colon cancer and lung cancer. Right upper quadrant abdominal pain.  EXAM: CT CHEST, ABDOMEN, AND PELVIS WITH CONTRAST  TECHNIQUE: Multidetector CT imaging of the chest, abdomen and pelvis was performed following the standard protocol during bolus administration of intravenous contrast.  CONTRAST:  159mL OMNIPAQUE IOHEXOL 300 MG/ML  SOLN  COMPARISON:  CT abdomen 10/19/2013  FINDINGS: CT CHEST FINDINGS  The chest wall is unremarkable. A right-sided Port-A-Cath is noted. No chest wall mass or adenopathy. The thyroid gland is normal. No supraclavicular adenopathy. The bony thorax is intact. No destructive bone lesions. Moderate degenerative changes involving the thoracic spine.  The heart is normal in size. No pericardial effusion. No mediastinal or hilar mass or adenopathy. The aorta is normal in caliber. No dissection. The esophagus is grossly normal. Pulmonary arteries appear normal.  Examination of the lung parenchyma demonstrates advanced emphysematous changes. There are surgical scarring changes in the left upper lobe from a lung resection. No findings for recurrent or residual tumor. No evidence of pulmonary metastatic disease. No pleural effusions.  CT ABDOMEN AND PELVIS FINDINGS  The liver demonstrates mild diffuse fatty infiltration but no focal hepatic lesions. Gallbladder is mildly distended and there is a prominent fold but no inflammatory changes. No definite gallstones. No common bile duct dilatation. The pancreas is normal. The spleen is normal. No focal lesions. The adrenal glands and  kidneys are normal. There is a prominent extra renal pelvis on the right.  Examination of the stomach demonstrates a  Probable mass involving the anterior wall in the antral region. The lumen is significantly narrowed. Recommend endoscopic evaluation. Duodenum diverticuli are noted. No obstruction. The small bowel and colon are unremarkable. No inflammatory changes, mass lesions or obstructive findings. Scattered colonic diverticulosis. No mesenteric or retroperitoneal mass or  adenopathy. The aorta is normal in caliber. The branch vessels are patent. Moderate atherosclerotic calcifications distally. Slightly enlarging peritoneal implants are noted in the left upper quadrant. Largest measures 16 mm. The anterior lesion on image number 66 is also enlarged to approximately 13 mm. .  The bladder, prostate gland and seminal vesicles are unremarkable. No pelvic mass, adenopathy or free pelvic fluid collections. No inguinal mass or adenopathy.  The bony structures are intact. Advanced degenerative lumbar spondylosis with multilevel disc disease and facet disease. Bilateral pars defects are noted at L5.  IMPRESSION: 1. Surgical changes involving the left upper lobe but no findings for residual or recurrent tumor and no evidence of pulmonary metastatic disease. 2. No mediastinal or hilar mass or adenopathy. 3. Enlarging stomach mass involving the anterior wall in the antral region. It measures approximately 5 cm and is narrowing the gastric lumen. Recommend endoscopic evaluation. 4. Enlarging peritoneal implants.   Electronically Signed   By: Kalman Jewels M.D.   On: 04/29/2014 19:13   Ct Abdomen Pelvis W Contrast  04/29/2014   CLINICAL DATA:  History of colon cancer and lung cancer. Right upper quadrant abdominal pain.  EXAM: CT CHEST, ABDOMEN, AND PELVIS WITH CONTRAST  TECHNIQUE: Multidetector CT imaging of the chest, abdomen and pelvis was performed following the standard protocol during bolus administration of  intravenous contrast.  CONTRAST:  164mL OMNIPAQUE IOHEXOL 300 MG/ML  SOLN  COMPARISON:  CT abdomen 10/19/2013  FINDINGS: CT CHEST FINDINGS  The chest wall is unremarkable. A right-sided Port-A-Cath is noted. No chest wall mass or adenopathy. The thyroid gland is normal. No supraclavicular adenopathy. The bony thorax is intact. No destructive bone lesions. Moderate degenerative changes involving the thoracic spine.  The heart is normal in size. No pericardial effusion. No mediastinal or hilar mass or adenopathy. The aorta is normal in caliber. No dissection. The esophagus is grossly normal. Pulmonary arteries appear normal.  Examination of the lung parenchyma demonstrates advanced emphysematous changes. There are surgical scarring changes in the left upper lobe from a lung resection. No findings for recurrent or residual tumor. No evidence of pulmonary metastatic disease. No pleural effusions.  CT ABDOMEN AND PELVIS FINDINGS  The liver demonstrates mild diffuse fatty infiltration but no focal hepatic lesions. Gallbladder is mildly distended and there is a prominent fold but no inflammatory changes. No definite gallstones. No common bile duct dilatation. The pancreas is normal. The spleen is normal. No focal lesions. The adrenal glands and kidneys are normal. There is a prominent extra renal pelvis on the right.  Examination of the stomach demonstrates a  Probable mass involving the anterior wall in the antral region. The lumen is significantly narrowed. Recommend endoscopic evaluation. Duodenum diverticuli are noted. No obstruction. The small bowel and colon are unremarkable. No inflammatory changes, mass lesions or obstructive findings. Scattered colonic diverticulosis. No mesenteric or retroperitoneal mass or adenopathy. The aorta is normal in caliber. The branch vessels are patent. Moderate atherosclerotic calcifications distally. Slightly enlarging peritoneal implants are noted in the left upper quadrant. Largest  measures 16 mm. The anterior lesion on image number 66 is also enlarged to approximately 13 mm. .  The bladder, prostate gland and seminal vesicles are unremarkable. No pelvic mass, adenopathy or free pelvic fluid collections. No inguinal mass or adenopathy.  The bony structures are intact. Advanced degenerative lumbar spondylosis with multilevel disc disease and facet disease. Bilateral pars defects are noted at L5.  IMPRESSION: 1. Surgical changes involving the left upper lobe but no  findings for residual or recurrent tumor and no evidence of pulmonary metastatic disease. 2. No mediastinal or hilar mass or adenopathy. 3. Enlarging stomach mass involving the anterior wall in the antral region. It measures approximately 5 cm and is narrowing the gastric lumen. Recommend endoscopic evaluation. 4. Enlarging peritoneal implants.   Electronically Signed   By: Kalman Jewels M.D.   On: 04/29/2014 19:13       IMPRESSION: The patient has metastatic colon cancer, now with metastatic disease to the gastric antrum. This may be the source of the patient's pain and some narrowing was seen on imaging. This has been biopsy proven.  The patient I believe is an appropriate candidate for palliative radiation treatment to this area. We discussed the rationale of such treatment and the expected/possible benefit of radiation treatment in terms of local control. We also discussed the possible side effects and risks of treatment as well. All of the patient's questions were answered. He does wish to proceed with treatment.   PLAN: The patient will proceed with a simulation in the near future such that we can begin treatment planning. He has discussed possible treatment with medical oncology who has recommended concurrent chemotherapy with Xeloda. I anticipate treating the patient to a total of 37.5 gray in 15 fractions.      ________________________________   Jodelle Gross, MD, PhD   **Disclaimer: This note was  dictated with voice recognition software. Similar sounding words can inadvertently be transcribed and this note may contain transcription errors which may not have been corrected upon publication of note.**

## 2014-05-19 ENCOUNTER — Other Ambulatory Visit: Payer: Medicare Other

## 2014-05-19 ENCOUNTER — Ambulatory Visit (HOSPITAL_COMMUNITY): Payer: Medicare Other

## 2014-05-19 NOTE — Addendum Note (Signed)
Encounter addended by: Marye Round, MD on: 05/19/2014  4:28 PM<BR>     Documentation filed: Notes Section

## 2014-05-20 ENCOUNTER — Other Ambulatory Visit: Payer: Self-pay | Admitting: *Deleted

## 2014-05-20 MED ORDER — CAPECITABINE 500 MG PO TABS: 1500.0000 mg | ORAL_TABLET | Freq: Two times a day (BID) | ORAL | Status: DC

## 2014-05-20 NOTE — Telephone Encounter (Signed)
Call from pt to follow up on Xeloda Rx. Pt has #32 tablets on hand to begin treatment. Requesting Rx to be sent to [facility]. Reviewed with Dr. Benay Spice, order received. Xeloda Rx sent electronically.

## 2014-05-24 ENCOUNTER — Ambulatory Visit: Payer: Medicare Other | Admitting: Oncology

## 2014-05-24 ENCOUNTER — Other Ambulatory Visit: Payer: Medicare Other

## 2014-05-24 ENCOUNTER — Encounter: Payer: Self-pay | Admitting: Radiation Oncology

## 2014-05-24 ENCOUNTER — Ambulatory Visit
Admission: RE | Admit: 2014-05-24 | Discharge: 2014-05-24 | Disposition: A | Discharge status: Home / Self Care | Payer: Medicare Other | Source: Ambulatory Visit | Attending: Radiation Oncology | Admitting: Radiation Oncology

## 2014-05-24 NOTE — Progress Notes (Signed)
Simulation verification note: The patient underwent simulation verification for treatment to his abdomen/stomach. His isocenter is in good position and the multileaf collimators contoured the treatment volume appropriately.

## 2014-05-25 ENCOUNTER — Ambulatory Visit
Admission: RE | Admit: 2014-05-25 | Discharge: 2014-05-25 | Disposition: A | Discharge status: Home / Self Care | Payer: Medicare Other | Source: Ambulatory Visit | Attending: Radiation Oncology | Admitting: Radiation Oncology

## 2014-05-25 DIAGNOSIS — C189 Malignant neoplasm of colon, unspecified: Secondary | ICD-10-CM

## 2014-05-25 MED ORDER — RADIAPLEXRX EX GEL
Freq: Once | CUTANEOUS | Status: AC
  Administered 2014-05-25: 14:00:00 via TOPICAL

## 2014-05-25 NOTE — Progress Notes (Addendum)
Patient education done, rad book, calender, my business card, radiaplex gel given to patient,discusses side effects, fatigue, skin irritation, nausea,vomiting, diarrhea, bladder changes, may need to get imodium  prn, pain, increase protein in diet,may need to eat 5-6 smaller meals, avoid milk producats,greasy foods, spicy foods if having diarrhea and nausea,sees MD on Fridays after rad txs  and prn, teach back given 1:58 PM

## 2014-05-26 ENCOUNTER — Ambulatory Visit
Admission: RE | Admit: 2014-05-26 | Discharge: 2014-05-26 | Disposition: A | Discharge status: Home / Self Care | Payer: Medicare Other | Source: Ambulatory Visit | Attending: Radiation Oncology | Admitting: Radiation Oncology

## 2014-05-26 NOTE — Telephone Encounter (Signed)
Received confirmation of capecitabine prescription shipment on 05/25/14 for delivery on the next business day from [facility].

## 2014-05-27 ENCOUNTER — Ambulatory Visit
Admission: RE | Admit: 2014-05-27 | Discharge: 2014-05-27 | Disposition: A | Discharge status: Home / Self Care | Payer: Medicare Other | Source: Ambulatory Visit | Attending: Radiation Oncology | Admitting: Radiation Oncology

## 2014-05-28 ENCOUNTER — Encounter: Payer: Self-pay | Admitting: Radiation Oncology

## 2014-05-28 ENCOUNTER — Ambulatory Visit
Admission: RE | Admit: 2014-05-28 | Discharge: 2014-05-28 | Disposition: A | Discharge status: Home / Self Care | Payer: Medicare Other | Source: Ambulatory Visit | Attending: Radiation Oncology | Admitting: Radiation Oncology

## 2014-05-28 VITALS — BP 139/73 | HR 91 | Temp 97.6°F | Resp 20 | Wt 173.3 lb

## 2014-05-28 DIAGNOSIS — C799 Secondary malignant neoplasm of unspecified site: Principal | ICD-10-CM

## 2014-05-28 DIAGNOSIS — C189 Malignant neoplasm of colon, unspecified: Secondary | ICD-10-CM

## 2014-05-28 NOTE — Progress Notes (Signed)
Weekly rad txs stomach 4/15 completed, has radiaplex to start using when skin becomes irritated no c/o nausea, did have loose bowel movement yesterday and some abdominal pain took pain medication,  Which helped, no pain at present 4:00 PM

## 2014-05-28 NOTE — Progress Notes (Signed)
Department of Radiation Oncology  Phone:  [phone] Fax:        [phone]  Weekly Treatment Note    Name: [name] Date: 05/28/2014 MRN: [id number] DOB: 09/15/1941   Current dose: 10 Gy  Current fraction: 4   MEDICATIONS: Current Outpatient Prescriptions  Medication Sig Dispense Refill  . amoxicillin (AMOXIL) 500 MG capsule       . aspirin 81 MG tablet Take 81 mg by mouth daily.       . capecitabine (XELODA) 500 MG tablet Take 3 tablets (1,500 mg total) by mouth 2 (two) times daily after a meal. Take on days of radiation only. (Mon-Fri)  58 tablet  0  . Cholecalciferol (VITAMIN D3) 2000 UNITS TABS Take 1 tablet by mouth daily.       . cyanocobalamin 500 MCG tablet Take 1,000 mcg by mouth daily.       . dorzolamide-timolol (COSOPT) 22.3-6.8 MG/ML ophthalmic solution Place 1 drop into the left eye 2 (two) times daily.       Marland Kitchen ezetimibe-simvastatin (VYTORIN) 10-40 MG per tablet 0.5 tablets 3 (three) times a week. 1/2 tab 4-5 times per week      . hyaluronate sodium (RADIAPLEXRX) GEL Apply 1 application topically 2 (two) times daily. Apply daily after rad tx and on weekends & prn      . HYDROcodone-acetaminophen (NORCO/VICODIN) 5-325 MG per tablet 1-2 po q 4h prn pain  75 tablet  0  . lidocaine (XYLOCAINE) 2 % solution 5% solution. 60mls gargle and spit out every 2 hours as needed  100 mL  4  . loperamide (IMODIUM A-D) 2 MG tablet Take by mouth as needed for diarrhea or loose stools. Take 2 tablets at the first sign of diarrhea then take one every 2 hours. Take 2 tablets at bedtime. Stop when you have not had a loose stool in twelve hours.      Marland Kitchen loratadine (CLARITIN) 10 MG tablet Take 10 mg by mouth 2 (two) times daily.        Marland Kitchen LORazepam (ATIVAN) 0.5 MG tablet Take one to two at bedtime for sleep as needed  60 tablet  1  . loteprednol (LOTEMAX) 0.5 % ophthalmic suspension Place 1 drop into the left eye daily.       . metFORMIN (GLUMETZA) 1000 MG (MOD) 24 hr tablet Take  1,000 mg by mouth daily after supper.       . Omega-3 Fatty Acids (FISH OIL) 1000 MG CAPS Take 6 capsules by mouth daily.       . ondansetron (ZOFRAN) 8 MG tablet Take 1 tablet (8 mg total) by mouth every 12 (twelve) hours as needed for nausea.  30 tablet  1  . oxyCODONE-acetaminophen (PERCOCET/ROXICET) 5-325 MG per tablet       . pantoprazole (PROTONIX) 40 MG tablet Take 1 tablet (40 mg total) by mouth daily.  30 tablet  3  . testosterone enanthate (DELATESTRYL) 200 MG/ML injection Inject 500 mg into the muscle every 21 ( twenty-one) days. Every 3 weeks currently       No current facility-administered medications for this encounter.     ALLERGIES: Review of patient's allergies indicates no known allergies.   LABORATORY DATA:  Lab Results  Component Value Date   WBC 10.0 05/12/2014   HGB 16.1 05/12/2014   HCT 49.5 05/12/2014   MCV 92.5 05/12/2014   PLT 170 05/12/2014   Lab Results  Component Value Date   NA 144 05/12/2014  K 4.1 05/12/2014   CL 108* 06/17/2013   CO2 25 05/12/2014   Lab Results  Component Value Date   ALT 9 05/12/2014   AST 11 05/12/2014   ALKPHOS 66 05/12/2014   BILITOT 0.66 05/12/2014     NARRATIVE: [name] was seen today for weekly treatment management. The chart was checked and the patient's films were reviewed. The patient states that he is doing well. He has had some occasional loose stools but Imodium has been controlling this well. He is also had a little bit of stomach pain but again the pain medicine has controlled this well and this has been occasional in nature. No nausea.  PHYSICAL EXAMINATION: weight is 173 lb 4.8 oz (78.608 kg). His oral temperature is 97.6 F (36.4 C). His blood pressure is 139/73 and his pulse is 91. His respiration is 20.        ASSESSMENT: The patient is doing satisfactorily with treatment.  PLAN: We will continue with the patient's radiation treatment as planned.

## 2014-05-30 NOTE — Progress Notes (Addendum)
  Radiation Oncology         [phone] ________________________________  Name: [name] MRN: [id number]  Date: 05/14/2014  DOB: 10-19-1941  SIMULATION AND TREATMENT PLANNING NOTE  DIAGNOSIS:  Metastatic colon cancer  Site:  Stomach  NARRATIVE:  The patient was brought to the [facility].  Identity was confirmed.  All relevant records and images related to the planned course of therapy were reviewed.   Written consent to proceed with treatment was confirmed which was freely given after reviewing the details related to the planned course of therapy had been reviewed with the patient.  Then, the patient was set-up in a stable reproducible  supine position for radiation therapy.  CT images were obtained.  Surface markings were placed.    Medically necessary complex treatment device(s) for immobilization:  Customized VAC lock.   The CT images were loaded into the planning software.  Then the target and avoidance structures were contoured.  Treatment planning then occurred.  The radiation prescription was entered and confirmed.  A total of 5 complex treatment devices were fabricated which relate to the designed radiation treatment fields. Each of these customized fields/ complex treatment devices will be used on a daily basis during the radiation course. I have requested : 3D Simulation  I have requested a DVH of the following structures: Target, spinal cord, liver, heart.   PLAN:  The patient will receive 37.5 Gy in 15 fractions.   Special treatment procedure The patient will also receive concurrent chemotherapy during the treatment. The patient may therefore experience increased toxicity or side effects and the patient will be monitored for such problems. This may require extra lab work as necessary. This therefore constitutes a special treatment procedure.  ________________________________   Jodelle Gross, MD, PhD

## 2014-05-30 NOTE — Addendum Note (Signed)
Encounter addended by: Marye Round, MD on: 05/30/2014  8:47 AM<BR>     Documentation filed: Notes Section, Visit Diagnoses, Follow-up Section, LOS Section

## 2014-05-31 ENCOUNTER — Ambulatory Visit
Admission: RE | Admit: 2014-05-31 | Discharge: 2014-05-31 | Disposition: A | Discharge status: Home / Self Care | Payer: Medicare Other | Source: Ambulatory Visit | Attending: Radiation Oncology | Admitting: Radiation Oncology

## 2014-06-01 ENCOUNTER — Ambulatory Visit
Admission: RE | Admit: 2014-06-01 | Discharge: 2014-06-01 | Disposition: A | Discharge status: Home / Self Care | Payer: Medicare Other | Source: Ambulatory Visit | Attending: Radiation Oncology | Admitting: Radiation Oncology

## 2014-06-02 ENCOUNTER — Other Ambulatory Visit: Payer: Self-pay | Admitting: *Deleted

## 2014-06-02 ENCOUNTER — Ambulatory Visit
Admission: RE | Admit: 2014-06-02 | Discharge: 2014-06-02 | Disposition: A | Discharge status: Home / Self Care | Payer: Medicare Other | Source: Ambulatory Visit | Attending: Radiation Oncology | Admitting: Radiation Oncology

## 2014-06-02 NOTE — Telephone Encounter (Signed)
Refill request from [facility] for Xeloda to MD desk for visit on 06/03/14.

## 2014-06-03 ENCOUNTER — Encounter: Payer: Self-pay | Admitting: *Deleted

## 2014-06-03 ENCOUNTER — Ambulatory Visit (HOSPITAL_BASED_OUTPATIENT_CLINIC_OR_DEPARTMENT_OTHER): Payer: Medicare Other | Admitting: Oncology

## 2014-06-03 ENCOUNTER — Telehealth: Payer: Self-pay | Admitting: Oncology

## 2014-06-03 ENCOUNTER — Ambulatory Visit
Admission: RE | Admit: 2014-06-03 | Discharge: 2014-06-03 | Disposition: A | Discharge status: Home / Self Care | Payer: Medicare Other | Source: Ambulatory Visit | Attending: Radiation Oncology | Admitting: Radiation Oncology

## 2014-06-03 ENCOUNTER — Encounter: Payer: Self-pay | Admitting: Oncology

## 2014-06-03 ENCOUNTER — Ambulatory Visit (HOSPITAL_BASED_OUTPATIENT_CLINIC_OR_DEPARTMENT_OTHER): Payer: Medicare Other

## 2014-06-03 VITALS — BP 127/68 | HR 74 | Temp 96.8°F | Resp 18 | Ht 67.0 in | Wt 172.7 lb

## 2014-06-03 DIAGNOSIS — C786 Secondary malignant neoplasm of retroperitoneum and peritoneum: Secondary | ICD-10-CM

## 2014-06-03 DIAGNOSIS — E119 Type 2 diabetes mellitus without complications: Secondary | ICD-10-CM

## 2014-06-03 DIAGNOSIS — C185 Malignant neoplasm of splenic flexure: Secondary | ICD-10-CM

## 2014-06-03 DIAGNOSIS — G609 Hereditary and idiopathic neuropathy, unspecified: Secondary | ICD-10-CM

## 2014-06-03 DIAGNOSIS — T451X5A Adverse effect of antineoplastic and immunosuppressive drugs, initial encounter: Secondary | ICD-10-CM

## 2014-06-03 DIAGNOSIS — C189 Malignant neoplasm of colon, unspecified: Secondary | ICD-10-CM

## 2014-06-03 LAB — CBC WITH DIFFERENTIAL/PLATELET
BASO%: 0.5 % (ref 0.0–2.0)
Basophils Absolute: 0 10*3/uL (ref 0.0–0.1)
EOS ABS: 0.5 10*3/uL (ref 0.0–0.5)
EOS%: 4.7 % (ref 0.0–7.0)
HEMATOCRIT: 47.3 % (ref 38.4–49.9)
HGB: 15.5 g/dL (ref 13.0–17.1)
LYMPH#: 0.5 10*3/uL — AB (ref 0.9–3.3)
LYMPH%: 4.9 % — ABNORMAL LOW (ref 14.0–49.0)
MCH: 29.2 pg (ref 27.2–33.4)
MCHC: 32.7 g/dL (ref 32.0–36.0)
MCV: 89.1 fL (ref 79.3–98.0)
MONO#: 0.8 10*3/uL (ref 0.1–0.9)
MONO%: 8.7 % (ref 0.0–14.0)
NEUT%: 81.2 % — ABNORMAL HIGH (ref 39.0–75.0)
NEUTROS ABS: 7.9 10*3/uL — AB (ref 1.5–6.5)
PLATELETS: 167 10*3/uL (ref 140–400)
RBC: 5.3 10*6/uL (ref 4.20–5.82)
RDW: 18.8 % — ABNORMAL HIGH (ref 11.0–14.6)
WBC: 9.8 10*3/uL (ref 4.0–10.3)

## 2014-06-03 NOTE — Progress Notes (Signed)
[facility] OFFICE PROGRESS NOTE   Diagnosis: Colon cancer  INTERVAL HISTORY:   [name] returns as scheduled. He began radiation and concurrent Xeloda on 05/25/2014. He reports stable abdominal pain relieved with hydrocodone. Minimal diarrhea. No nausea, mouth sores, or hand/foot pain.  Objective:  Vital signs in last 24 hours:  Blood pressure 127/68, pulse 74, temperature 96.8 F (36 C), temperature source Oral, resp. rate 18, height 5' 7"  (1.702 m), weight 172 lb 11.2 oz (78.336 kg).    HEENT: No thrush or ulcers Resp: Lungs clear bilaterally Cardio: Regular rate and rhythm GI: No hepatomegaly, nontender Vascular: No leg edema  Skin: Mild erythema at the anterior chest, palms without erythema   Portacath/PICC-without erythema  Lab Results:  Lab Results  Component Value Date   WBC 10.0 05/12/2014   HGB 16.1 05/12/2014   HCT 49.5 05/12/2014   MCV 92.5 05/12/2014   PLT 170 05/12/2014   NEUTROABS 7.7* 05/12/2014     Lab Results  Component Value Date   CEA 32.4* 05/12/2014    Medications: I have reviewed the patient's current medications.  Assessment/Plan: 1. 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  Initially diagnosed with stage III colon cancer July 2012 status post left colectomy 07/09/2011.  K-ras mutation not detected on initial testing. Extended testing returned negative for the K-ras mutation.  Adjuvant Xeloda initiated July 2012.  Rise in the CEA tumor marker and 2 intra-abdominal soft tissue densities on CT scan November 2012 while on adjuvant Xeloda chemotherapy.  Oxaliplatin and Avastin were added to the regimen beginning 11/26/2011 with a decrease in the CEA and continued through 04/23/2012. Oxaliplatin discontinued at that time due to cumulative neurotoxicity.  Xeloda and Avastin continued.  Restaging CT evaluation 04/09/2013 showed an increase in the previously noted peritoneal implants.  FOLFIRI initiated 04/29/2013.  Irinotecan dose reduced following cycle 1 due to mucositis.  Avastin added beginning with cycle 3.  Restaging CT evaluation 07/27/2013 (after 6 cycles) showed interval resolution of a previously described left lower lobe nodule. Interval improvement in peritoneal nodularity. He completed a total of 14 cycles through 11/23/2013. Treatment was adjusted to [facility] following an office visit 12/07/2013 due to a significant decline in his performance status.  Avastin placed on hold beginning 03/31/2014 due to increased urine protein. 24-hour urine on 04/02/2014 showed 814 mg of protein.  03/31/2014 CEA mildly increased at 20.3 as compared to 15.6 on 03/03/2014 and 10.4 on 02/03/2014.  Xeloda placed on hold beginning 04/14/2014 pending upcoming CT scans.  CT scan chest/abdomen/pelvis 04/29/2014 showed an enlarging stomach mass involving the anterior wall in the antral region measuring approximately 5 cm. The mass was narrowing the gastric lumen. Peritoneal implants in the left upper quadrant were noted to be slightly more enlarged.  Upper endoscopy 05/04/2014 with findings of a 5 x 5 cm mass in the gastric antrum. Biopsy showed metastatic adenocarcinoma. Initiation of pelvic radiation and concurrent Xeloda 05/25/2014 2. Status post Port-A-Cath placement 04/27/2013. 3. Status post resection of a stage I non-small cell lung cancer left upper lung with postop brachytherapy June 2007. 4. 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 Radiographic complete regression on Avastin based chemotherapy. 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 6. Type 2 diabetes. 7. History of nephrolithiasis. 8. Status post bilateral corneal transplants. 9. Increased urine protein on Avastin. 10.  Intermittent upper abdominal pain secondary to gastric  mass.   Disposition:  Mr. Morrical is completing a course of palliative radiation and Xeloda for treatment of a painful gastric mass. He is tolerating the treatment well. The pain is stable. He is scheduled to complete treatment on 06/14/2014. He will return for an office visit on 06/28/2014. We will discuss systemic treatment options then.  We will check a CBC today. He will return for a Port-A-Cath flush and lab visit 06/14/2014.  Betsy Coder, MD  06/03/2014  10:33 AM

## 2014-06-03 NOTE — Telephone Encounter (Signed)
gv and printed aptps ched and avs for pt for June adn July

## 2014-06-03 NOTE — Progress Notes (Signed)
Fax to [facility] that Xeloda refill is not needed. Patient told nurse at visit today that he has enough drug to complete his treatment.

## 2014-06-04 ENCOUNTER — Encounter: Payer: Self-pay | Admitting: Radiation Oncology

## 2014-06-04 ENCOUNTER — Ambulatory Visit
Admission: RE | Admit: 2014-06-04 | Discharge: 2014-06-04 | Disposition: A | Discharge status: Home / Self Care | Payer: Medicare Other | Source: Ambulatory Visit | Attending: Radiation Oncology | Admitting: Radiation Oncology

## 2014-06-04 VITALS — BP 133/74 | HR 73 | Temp 97.7°F | Resp 20 | Wt 171.3 lb

## 2014-06-04 DIAGNOSIS — C189 Malignant neoplasm of colon, unspecified: Secondary | ICD-10-CM

## 2014-06-04 DIAGNOSIS — C799 Secondary malignant neoplasm of unspecified site: Principal | ICD-10-CM

## 2014-06-04 NOTE — Progress Notes (Signed)
Weekly rad txs, stomach, no nausea, slight diarrhea,takes imodium ad prn,, missed 1 day of taking his Xeloda this eeek, has taken todays, appetite fair, energy okay 4:16 PM

## 2014-06-06 NOTE — Progress Notes (Signed)
Department of Radiation Oncology  Phone:  [phone] Fax:        [phone]  Weekly Treatment Note    Name: [name] Date: 06/06/2014 MRN: [id number] DOB: 16-Oct-1941   Current dose: 22.5 Gy  Current fraction: 9   MEDICATIONS: Current Outpatient Prescriptions  Medication Sig Dispense Refill  . amoxicillin (AMOXIL) 500 MG capsule       . aspirin 81 MG tablet Take 81 mg by mouth daily.       . capecitabine (XELODA) 500 MG tablet Take 3 tablets (1,500 mg total) by mouth 2 (two) times daily after a meal. Take on days of radiation only. (Mon-Fri)  58 tablet  0  . Cholecalciferol (VITAMIN D3) 2000 UNITS TABS Take 1 tablet by mouth daily.       . cyanocobalamin 500 MCG tablet Take 1,000 mcg by mouth daily.       . dorzolamide-timolol (COSOPT) 22.3-6.8 MG/ML ophthalmic solution Place 1 drop into the left eye 2 (two) times daily.       Marland Kitchen ezetimibe-simvastatin (VYTORIN) 10-40 MG per tablet 0.5 tablets 3 (three) times a week. 1/2 tab 4-5 times per week      . hyaluronate sodium (RADIAPLEXRX) GEL Apply 1 application topically 2 (two) times daily. Apply daily after rad tx and on weekends & prn      . HYDROcodone-acetaminophen (NORCO/VICODIN) 5-325 MG per tablet 1-2 po q 4h prn pain  75 tablet  0  . loperamide (IMODIUM A-D) 2 MG tablet Take by mouth as needed for diarrhea or loose stools. Take 2 tablets at the first sign of diarrhea then take one every 2 hours. Take 2 tablets at bedtime. Stop when you have not had a loose stool in twelve hours.      Marland Kitchen loratadine (CLARITIN) 10 MG tablet Take 10 mg by mouth 2 (two) times daily.        Marland Kitchen LORazepam (ATIVAN) 0.5 MG tablet Take one to two at bedtime for sleep as needed  60 tablet  1  . loteprednol (LOTEMAX) 0.5 % ophthalmic suspension Place 1 drop into the left eye daily.       . metFORMIN (GLUMETZA) 1000 MG (MOD) 24 hr tablet Take 1,000 mg by mouth daily after supper.       . Omega-3 Fatty Acids (FISH OIL) 1000 MG CAPS Take 6 capsules by  mouth daily.       . ondansetron (ZOFRAN) 8 MG tablet Take 1 tablet (8 mg total) by mouth every 12 (twelve) hours as needed for nausea.  30 tablet  1  . oxyCODONE-acetaminophen (PERCOCET/ROXICET) 5-325 MG per tablet       . pantoprazole (PROTONIX) 40 MG tablet Take 1 tablet (40 mg total) by mouth daily.  30 tablet  3  . testosterone enanthate (DELATESTRYL) 200 MG/ML injection Inject 500 mg into the muscle every 21 ( twenty-one) days. Every 3 weeks currently       No current facility-administered medications for this encounter.     ALLERGIES: Review of patient's allergies indicates no known allergies.   LABORATORY DATA:  Lab Results  Component Value Date   WBC 9.8 06/03/2014   HGB 15.5 06/03/2014   HCT 47.3 06/03/2014   MCV 89.1 06/03/2014   PLT 167 06/03/2014   Lab Results  Component Value Date   NA 144 05/12/2014   K 4.1 05/12/2014   CL 108* 06/17/2013   CO2 25 05/12/2014   Lab Results  Component Value Date  ALT 9 05/12/2014   AST 11 05/12/2014   ALKPHOS 66 05/12/2014   BILITOT 0.66 05/12/2014     NARRATIVE: [name] was seen today for weekly treatment management. The chart was checked and the patient's films were reviewed. The patient states that he is doing very well. He really has not experienced any nausea. He has had some occasional diarrhea which has been well controlled with Imodium. Appetite continues to be fairly good.  PHYSICAL EXAMINATION: weight is 171 lb 4.8 oz (77.701 kg). His oral temperature is 97.7 F (36.5 C). His blood pressure is 133/74 and his pulse is 73. His respiration is 20.        ASSESSMENT: The patient is doing satisfactorily with treatment.  PLAN: We will continue with the patient's radiation treatment as planned.

## 2014-06-07 ENCOUNTER — Ambulatory Visit
Admission: RE | Admit: 2014-06-07 | Discharge: 2014-06-07 | Disposition: A | Discharge status: Home / Self Care | Payer: Medicare Other | Source: Ambulatory Visit | Attending: Radiation Oncology | Admitting: Radiation Oncology

## 2014-06-08 ENCOUNTER — Ambulatory Visit
Admission: RE | Admit: 2014-06-08 | Discharge: 2014-06-08 | Disposition: A | Discharge status: Home / Self Care | Payer: Medicare Other | Source: Ambulatory Visit | Attending: Radiation Oncology | Admitting: Radiation Oncology

## 2014-06-09 ENCOUNTER — Ambulatory Visit
Admission: RE | Admit: 2014-06-09 | Discharge: 2014-06-09 | Disposition: A | Discharge status: Home / Self Care | Payer: Medicare Other | Source: Ambulatory Visit | Attending: Radiation Oncology | Admitting: Radiation Oncology

## 2014-06-09 ENCOUNTER — Encounter: Payer: Self-pay | Admitting: Radiation Oncology

## 2014-06-09 NOTE — Progress Notes (Signed)
  Radiation Oncology         [phone] ________________________________  Name: [name]  MRN: [id number]  Date: 06/10/2014  DOB: [birth year]  Weekly Radiation Therapy Management  Current Dose: 32.5 Gy     Planned Dose:  37.5 Gy  Narrative . . . . . . . . The patient presents for routine under treatment assessment.                                   The patient takes Xeloda bid, takes imodium prn diarrhea, not too bad stated patient, no nausea, appetite fair, fatigue some                                  Set-up films were reviewed.                                 The chart was checked. Physical Findings. . .  weight is 168 lb 6.4 oz (76.386 kg). His temperature is 98 F (36.7 C). His blood pressure is 132/76 and his pulse is 76. His respiration is 20. . Weight essentially stable.  No significant changes. Impression . . . . . . . The patient is tolerating radiation. Plan . . . . . . . . . . . . Continue treatment as planned.  ________________________________  Sheral Apley. Tammi Klippel, M.D.

## 2014-06-10 ENCOUNTER — Ambulatory Visit
Admission: RE | Admit: 2014-06-10 | Discharge: 2014-06-10 | Disposition: A | Discharge status: Home / Self Care | Payer: Medicare Other | Source: Ambulatory Visit | Attending: Radiation Oncology | Admitting: Radiation Oncology

## 2014-06-10 ENCOUNTER — Encounter: Payer: Self-pay | Admitting: Radiation Oncology

## 2014-06-10 ENCOUNTER — Ambulatory Visit: Payer: Medicare Other

## 2014-06-10 VITALS — BP 132/76 | HR 76 | Temp 98.0°F | Resp 20 | Wt 168.4 lb

## 2014-06-10 DIAGNOSIS — C189 Malignant neoplasm of colon, unspecified: Secondary | ICD-10-CM

## 2014-06-10 DIAGNOSIS — C799 Secondary malignant neoplasm of unspecified site: Principal | ICD-10-CM

## 2014-06-10 NOTE — Progress Notes (Addendum)
Weekly rad txs abdomen, 13 completed he takes Xeloda bid, takes imodium prn diarrhea, not too bad stated patient, no nausea, appetite fair, fatigue some Not otherwise examined today.

## 2014-06-11 ENCOUNTER — Ambulatory Visit
Admission: RE | Admit: 2014-06-11 | Discharge: 2014-06-11 | Disposition: A | Discharge status: Home / Self Care | Payer: Medicare Other | Source: Ambulatory Visit | Attending: Radiation Oncology | Admitting: Radiation Oncology

## 2014-06-14 ENCOUNTER — Ambulatory Visit
Admission: RE | Admit: 2014-06-14 | Discharge: 2014-06-14 | Disposition: A | Discharge status: Home / Self Care | Payer: Medicare Other | Source: Ambulatory Visit | Attending: Radiation Oncology | Admitting: Radiation Oncology

## 2014-06-14 ENCOUNTER — Ambulatory Visit (HOSPITAL_BASED_OUTPATIENT_CLINIC_OR_DEPARTMENT_OTHER): Payer: Medicare Other

## 2014-06-14 ENCOUNTER — Encounter: Payer: Self-pay | Admitting: Radiation Oncology

## 2014-06-14 ENCOUNTER — Other Ambulatory Visit (HOSPITAL_BASED_OUTPATIENT_CLINIC_OR_DEPARTMENT_OTHER): Payer: Medicare Other

## 2014-06-14 VITALS — BP 140/80 | HR 91 | Temp 97.5°F | Resp 20 | Wt 168.7 lb

## 2014-06-14 DIAGNOSIS — C189 Malignant neoplasm of colon, unspecified: Secondary | ICD-10-CM

## 2014-06-14 DIAGNOSIS — C786 Secondary malignant neoplasm of retroperitoneum and peritoneum: Secondary | ICD-10-CM

## 2014-06-14 DIAGNOSIS — Z452 Encounter for adjustment and management of vascular access device: Secondary | ICD-10-CM

## 2014-06-14 DIAGNOSIS — Z95828 Presence of other vascular implants and grafts: Secondary | ICD-10-CM

## 2014-06-14 DIAGNOSIS — C185 Malignant neoplasm of splenic flexure: Secondary | ICD-10-CM

## 2014-06-14 DIAGNOSIS — C799 Secondary malignant neoplasm of unspecified site: Principal | ICD-10-CM

## 2014-06-14 LAB — CBC WITH DIFFERENTIAL/PLATELET
BASO%: 0.5 % (ref 0.0–2.0)
Basophils Absolute: 0.1 10*3/uL (ref 0.0–0.1)
EOS ABS: 0.7 10*3/uL — AB (ref 0.0–0.5)
EOS%: 6.7 % (ref 0.0–7.0)
HEMATOCRIT: 48.3 % (ref 38.4–49.9)
HEMOGLOBIN: 15.7 g/dL (ref 13.0–17.1)
LYMPH%: 3.6 % — AB (ref 14.0–49.0)
MCH: 29.1 pg (ref 27.2–33.4)
MCHC: 32.5 g/dL (ref 32.0–36.0)
MCV: 89.8 fL (ref 79.3–98.0)
MONO#: 0.9 10*3/uL (ref 0.1–0.9)
MONO%: 8.1 % (ref 0.0–14.0)
NEUT#: 8.9 10*3/uL — ABNORMAL HIGH (ref 1.5–6.5)
NEUT%: 81.1 % — AB (ref 39.0–75.0)
PLATELETS: 160 10*3/uL (ref 140–400)
RBC: 5.38 10*6/uL (ref 4.20–5.82)
RDW: 19.7 % — ABNORMAL HIGH (ref 11.0–14.6)
WBC: 11 10*3/uL — AB (ref 4.0–10.3)
lymph#: 0.4 10*3/uL — ABNORMAL LOW (ref 0.9–3.3)

## 2014-06-14 LAB — COMPREHENSIVE METABOLIC PANEL (CC13)
ALT: 7 U/L (ref 0–55)
ANION GAP: 7 meq/L (ref 3–11)
AST: 10 U/L (ref 5–34)
Albumin: 3.2 g/dL — ABNORMAL LOW (ref 3.5–5.0)
Alkaline Phosphatase: 67 U/L (ref 40–150)
BILIRUBIN TOTAL: 0.41 mg/dL (ref 0.20–1.20)
BUN: 15.7 mg/dL (ref 7.0–26.0)
CALCIUM: 10.4 mg/dL (ref 8.4–10.4)
CO2: 27 meq/L (ref 22–29)
CREATININE: 1.2 mg/dL (ref 0.7–1.3)
Chloride: 106 mEq/L (ref 98–109)
Glucose: 132 mg/dl (ref 70–140)
Potassium: 4.6 mEq/L (ref 3.5–5.1)
Sodium: 140 mEq/L (ref 136–145)
Total Protein: 6.4 g/dL (ref 6.4–8.3)

## 2014-06-14 MED ORDER — SODIUM CHLORIDE 0.9 % IJ SOLN
10.0000 mL | INTRAMUSCULAR | Status: DC | PRN
  Administered 2014-06-14: 10 mL via INTRAVENOUS
  Filled 2014-06-14: qty 10

## 2014-06-14 MED ORDER — HEPARIN SOD (PORK) LOCK FLUSH 100 UNIT/ML IV SOLN
500.0000 [IU] | Freq: Once | INTRAVENOUS | Status: AC
  Administered 2014-06-14: 500 [IU] via INTRAVENOUS
  Filled 2014-06-14: qty 5

## 2014-06-14 NOTE — Progress Notes (Signed)
Weekly radt xs 15/15 stomach completed, already has 1 month f/u appt card, did have very bad pain in abdomen over the weekend stated,took 80 0mg  mg Ibuprofen and took hydrocodone as well, no nausea, some cramping as well, feels like a really bad hunger pain that won't stop, a bad aching, better today, felt like he was incapacitated over the weekend, fatigued,  meds todayt at 6am tookl some advil 3:42 PM

## 2014-06-14 NOTE — Progress Notes (Signed)
Department of Radiation Oncology  Phone:  [phone] Fax:        [phone]  Weekly Treatment Note    Name: [name] Date: 06/14/2014 MRN: [id number] DOB: 1941/01/02   Current dose: 37.5 Gy  Current fraction: 15   MEDICATIONS: Current Outpatient Prescriptions  Medication Sig Dispense Refill  . amoxicillin (AMOXIL) 500 MG capsule       . aspirin 81 MG tablet Take 81 mg by mouth daily.       . capecitabine (XELODA) 500 MG tablet Take 3 tablets (1,500 mg total) by mouth 2 (two) times daily after a meal. Take on days of radiation only. (Mon-Fri)  58 tablet  0  . Cholecalciferol (VITAMIN D3) 2000 UNITS TABS Take 1 tablet by mouth daily.       . cyanocobalamin 500 MCG tablet Take 1,000 mcg by mouth daily.       . dorzolamide-timolol (COSOPT) 22.3-6.8 MG/ML ophthalmic solution Place 1 drop into the left eye 2 (two) times daily.       Marland Kitchen ezetimibe-simvastatin (VYTORIN) 10-40 MG per tablet 0.5 tablets 3 (three) times a week. 1/2 tab 4-5 times per week      . hyaluronate sodium (RADIAPLEXRX) GEL Apply 1 application topically 2 (two) times daily. Apply daily after rad tx and on weekends & prn      . HYDROcodone-acetaminophen (NORCO/VICODIN) 5-325 MG per tablet 1-2 po q 4h prn pain  75 tablet  0  . loperamide (IMODIUM A-D) 2 MG tablet Take by mouth as needed for diarrhea or loose stools. Take 2 tablets at the first sign of diarrhea then take one every 2 hours. Take 2 tablets at bedtime. Stop when you have not had a loose stool in twelve hours.      Marland Kitchen loratadine (CLARITIN) 10 MG tablet Take 10 mg by mouth 2 (two) times daily.        Marland Kitchen LORazepam (ATIVAN) 0.5 MG tablet Take one to two at bedtime for sleep as needed  60 tablet  1  . loteprednol (LOTEMAX) 0.5 % ophthalmic suspension Place 1 drop into the left eye daily.       . metFORMIN (GLUMETZA) 1000 MG (MOD) 24 hr tablet Take 1,000 mg by mouth daily after supper.       . Omega-3 Fatty Acids (FISH OIL) 1000 MG CAPS Take 6 capsules by  mouth daily.       . ondansetron (ZOFRAN) 8 MG tablet Take 1 tablet (8 mg total) by mouth every 12 (twelve) hours as needed for nausea.  30 tablet  1  . oxyCODONE-acetaminophen (PERCOCET/ROXICET) 5-325 MG per tablet       . pantoprazole (PROTONIX) 40 MG tablet Take 1 tablet (40 mg total) by mouth daily.  30 tablet  3  . testosterone enanthate (DELATESTRYL) 200 MG/ML injection Inject 500 mg into the muscle every 21 ( twenty-one) days. Every 3 weeks currently       No current facility-administered medications for this encounter.     ALLERGIES: Review of patient's allergies indicates no known allergies.   LABORATORY DATA:  Lab Results  Component Value Date   WBC 11.0* 06/14/2014   HGB 15.7 06/14/2014   HCT 48.3 06/14/2014   MCV 89.8 06/14/2014   PLT 160 06/14/2014   Lab Results  Component Value Date   NA 140 06/14/2014   K 4.6 06/14/2014   CL 108* 06/17/2013   CO2 27 06/14/2014   Lab Results  Component Value Date  ALT 7 06/14/2014   AST 10 06/14/2014   ALKPHOS 67 06/14/2014   BILITOT 0.41 06/14/2014     NARRATIVE: [name] was seen today for weekly treatment management. The chart was checked and the patient's films were reviewed. The patient completed his final fraction of radiation treatment today. Overall he has done well at the end of treatment. He did have some abdominal pain over the weekend. This has improved and he has minimal discomfort today.  PHYSICAL EXAMINATION: weight is 168 lb 11.2 oz (76.522 kg). His oral temperature is 97.5 F (36.4 C). His blood pressure is 140/80 and his pulse is 91. His respiration is 20.        ASSESSMENT: The patient did satisfactorily with treatment. No substantial difficulties with acute side effects, no significant nausea during treatment. Some aching at the end of treatment abdominally.  PLAN: Followup in one month. I discussed with the patient that I do not anticipate worsening pain at this point but he is to let us know if this occurs.

## 2014-06-14 NOTE — Patient Instructions (Signed)
Implanted Port Home Guide  An implanted port is a type of central line that is placed under the skin. Central lines are used to provide IV access when treatment or nutrition needs to be given through a person's veins. Implanted ports are used for long-term IV access. An implanted port may be placed because:    You need IV medicine that would be irritating to the small veins in your hands or arms.    You need long-term IV medicines, such as antibiotics.    You need IV nutrition for a long period.    You need frequent blood draws for lab tests.    You need dialysis.   Implanted ports are usually placed in the chest area, but they can also be placed in the upper arm, the abdomen, or the leg. An implanted port has two main parts:    Reservoir. The reservoir is round and will appear as a small, raised area under your skin. The reservoir is the part where a needle is inserted to give medicines or draw blood.    Catheter. The catheter is a thin, flexible tube that extends from the reservoir. The catheter is placed into a large vein. Medicine that is inserted into the reservoir goes into the catheter and then into the vein.   HOW WILL I CARE FOR MY INCISION SITE?  Do not get the incision site wet. Bathe or shower as directed by your health care Tamila Gaulin.   HOW IS MY PORT ACCESSED?  Special steps must be taken to access the port:    Before the port is accessed, a numbing cream can be placed on the skin. This helps numb the skin over the port site.    Your health care Lashaundra Lehrmann uses a sterile technique to access the port.   Your health care Dayven Linsley must put on a mask and sterile gloves.   The skin over your port is cleaned carefully with an antiseptic and allowed to dry.   The port is gently pinched between sterile gloves, and a needle is inserted into the port.   Only "non-coring" port needles should be used to access the port. Once the port is accessed, a blood return should be checked. This helps  ensure that the port is in the vein and is not clogged.    If your port needs to remain accessed for a constant infusion, a clear (transparent) bandage will be placed over the needle site. The bandage and needle will need to be changed every week, or as directed by your health care Naomy Esham.    Keep the bandage covering the needle clean and dry. Do not get it wet. Follow your health care Lealer Marsland's instructions on how to take a shower or bath while the port is accessed.    If your port does not need to stay accessed, no bandage is needed over the port.   WHAT IS FLUSHING?  Flushing helps keep the port from getting clogged. Follow your health care Ernest Orr's instructions on how and when to flush the port. Ports are usually flushed with saline solution or a medicine called heparin. The need for flushing will depend on how the port is used.    If the port is used for intermittent medicines or blood draws, the port will need to be flushed:    After medicines have been given.    After blood has been drawn.    As part of routine maintenance.    If a constant infusion is   running, the port may not need to be flushed.   HOW LONG WILL MY PORT STAY IMPLANTED?  The port can stay in for as long as your health care Nachman Sundt thinks it is needed. When it is time for the port to come out, surgery will be done to remove it. The procedure is similar to the one performed when the port was put in.   WHEN SHOULD I SEEK IMMEDIATE MEDICAL CARE?  When you have an implanted port, you should seek immediate medical care if:    You notice a bad smell coming from the incision site.    You have swelling, redness, or drainage at the incision site.    You have more swelling or pain at the port site or the surrounding area.    You have a fever that is not controlled with medicine.  Document Released: 12/10/2005 Document Revised: 09/30/2013 Document Reviewed: 08/17/2013  [employer] Patient Information 2015 [employer]. This  information is not intended to replace advice given to you by your health care Sherrina Zaugg. Make sure you discuss any questions you have with your health care Anetria Harwick.

## 2014-06-28 ENCOUNTER — Ambulatory Visit (HOSPITAL_BASED_OUTPATIENT_CLINIC_OR_DEPARTMENT_OTHER): Payer: Medicare Other | Admitting: Nurse Practitioner

## 2014-06-28 ENCOUNTER — Telehealth: Payer: Self-pay | Admitting: Oncology

## 2014-06-28 VITALS — BP 125/62 | HR 97 | Temp 97.7°F | Resp 18 | Ht 67.0 in | Wt 162.4 lb

## 2014-06-28 DIAGNOSIS — C189 Malignant neoplasm of colon, unspecified: Secondary | ICD-10-CM

## 2014-06-28 DIAGNOSIS — C786 Secondary malignant neoplasm of retroperitoneum and peritoneum: Secondary | ICD-10-CM

## 2014-06-28 DIAGNOSIS — K319 Disease of stomach and duodenum, unspecified: Secondary | ICD-10-CM

## 2014-06-28 DIAGNOSIS — R109 Unspecified abdominal pain: Secondary | ICD-10-CM

## 2014-06-28 DIAGNOSIS — G620 Drug-induced polyneuropathy: Secondary | ICD-10-CM

## 2014-06-28 DIAGNOSIS — C185 Malignant neoplasm of splenic flexure: Secondary | ICD-10-CM

## 2014-06-28 NOTE — Addendum Note (Signed)
Encounter addended by: Marye Round, MD on: 06/28/2014  7:37 PM<BR>     Documentation filed: Notes Section

## 2014-06-28 NOTE — Progress Notes (Signed)
  Radiation Oncology         [phone] ________________________________  Name: [name] MRN: [id number]  Date: 06/14/2014  DOB: [birth year]  End of Treatment Note  Diagnosis:   Metastatic colon cancer     Indication for treatment:  Palliative       Radiation treatment dates:   05/25/2014 through 06/14/2014  Site/dose:   The patient was treated to the gastric metastasis to a dose of 37.5 gray at 2.5 gray per fraction. The patient's treatment consisted of a 5 field 3-D conformal technique with daily image guidance. He received concurrent chemotherapy during this treatment.  Narrative: The patient tolerated radiation treatment relatively well.   The patient did not exhibit significant difficulties in terms of nausea or diarrhea. No known significant episodes of GI bleeding during treatment.  Plan: The patient has completed radiation treatment. The patient will return to radiation oncology clinic for routine followup in one month. I advised the patient to call or return sooner if they have any questions or concerns related to their recovery or treatment. ________________________________  Jodelle Gross, M.D., Ph.D.

## 2014-06-28 NOTE — Telephone Encounter (Signed)
Gave pt appt for lab and Md

## 2014-06-28 NOTE — Progress Notes (Addendum)
[place] OFFICE PROGRESS NOTE   Diagnosis:  Colon cancer.  INTERVAL HISTORY:   Mr. Cart returns as scheduled. He completed radiation and Xeloda on 06/14/2014. He thinks the abdominal pain may be slightly better. He estimates taking pain medication once a day, typically in the evening hours. He denies nausea/vomiting. No mouth sores. No diarrhea. No hand or foot pain or redness. He has lost some weight. He attributes the weight loss to an increase in his activity level.  Objective:  Vital signs in last 24 hours:  Blood pressure 125/62, pulse 97, temperature 97.7 F (36.5 C), temperature source Oral, resp. rate 18, height _0  (1.702 m), weight 162 lb 6.4 oz (73.664 kg).    HEENT: No thrush or ulcerations. Resp: Lungs clear. Cardio: Regular cardiac rhythm. GI: Abdomen soft and nontender. No mass. No organomegaly. Vascular: No leg edema.  Skin: Palms without erythema.  Port-A-Cath site without erythema.    Lab Results:  Lab Results  Component Value Date   WBC 11.0* 06/14/2014   HGB 15.7 06/14/2014   HCT 48.3 06/14/2014   MCV 89.8 06/14/2014   PLT 160 06/14/2014   NEUTROABS 8.9* 06/14/2014    Imaging:  No results found.  Medications: I have reviewed the patient's current medications.  Assessment/Plan: 1. 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  Initially diagnosed with stage III colon cancer July 2012 status post left colectomy 07/09/2011.  K-ras mutation not detected on initial testing. Extended testing returned negative for the K-ras mutation.  Adjuvant Xeloda initiated July 2012.  Rise in the CEA tumor marker and 2 intra-abdominal soft tissue densities on CT scan November 2012 while on adjuvant Xeloda chemotherapy.  Oxaliplatin and Avastin were added to the regimen beginning 11/26/2011 with a decrease in the CEA and continued through 04/23/2012. Oxaliplatin discontinued at that time due to cumulative neurotoxicity.  Xeloda and Avastin  continued.  Restaging CT evaluation 04/09/2013 showed an increase in the previously noted peritoneal implants.  FOLFIRI initiated 04/29/2013. Irinotecan dose reduced following cycle 1 due to mucositis.  Avastin added beginning with cycle 3.  Restaging CT evaluation 07/27/2013 (after 6 cycles) showed interval resolution of a previously described left lower lobe nodule. Interval improvement in peritoneal nodularity. He completed a total of 14 cycles through 11/23/2013. Treatment was adjusted to [facility] following an office visit 12/07/2013 due to a significant decline in his performance status.  Avastin placed on hold beginning 03/31/2014 due to increased urine protein. 24-hour urine on 04/02/2014 showed 814 mg of protein.  03/31/2014 CEA mildly increased at 20.3 as compared to 15.6 on 03/03/2014 and 10.4 on 02/03/2014.  Xeloda placed on hold beginning 04/14/2014 pending upcoming CT scans.  CT scan chest/abdomen/pelvis 04/29/2014 showed an enlarging stomach mass involving the anterior wall in the antral region measuring approximately 5 cm. The mass was narrowing the gastric lumen. Peritoneal implants in the left upper quadrant were noted to be slightly more enlarged.  Upper endoscopy 05/04/2014 with findings of a 5 x 5 cm mass in the gastric antrum. Biopsy showed metastatic adenocarcinoma.  Initiation of gastric radiation and concurrent Xeloda 05/25/2014; completed 06/14/2014. 2. Status post Port-A-Cath placement 04/27/2013. 3. Status post resection of a stage I non-small cell lung cancer left upper lung with postop brachytherapy June 2007. 4. 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 Radiographic complete regression on Avastin based chemotherapy. 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 6. Type 2 diabetes. 7. History of  nephrolithiasis. 8. Status post bilateral corneal transplants. 9. Increased urine protein on Avastin. 10. Intermittent upper abdominal pain secondary to gastric mass.   Disposition: Mr. Wohler appears stable. He has completed the course of radiation/Xeloda to the gastric mass. He continues to have intermittent abdominal pain, though thinks this may be somewhat improved.  Dr. Benay Spice again reviewed systemic treatment options to include irinotecan/Panitumumab versus retreatment with oxaliplatin. We discussed obtaining microsatellite instability testing.  Mr. Murata will return for a followup visit in approximately 2 weeks for additional discussion. He will contact the office in the interim with any problems.   Patient seen with Dr. Benay Spice.    Ned Card ANP/GNP-BC   06/28/2014  4:06 PM This was a shared visit with Ned Card. Mr. Maul has completed Xeloda and radiation. He continues to have abdominal pain. We decided to continue observation and consider irinotecan/panitumumab for persistent pain or new symptoms. We will also submit his tumor for microsatellite instability testing.  Julieanne Manson, M.D.

## 2014-06-29 ENCOUNTER — Telehealth: Payer: Self-pay | Admitting: *Deleted

## 2014-06-29 NOTE — Telephone Encounter (Signed)
Per order from Dr. Benay Spice, request made to J. Epps in pathology to perform MSI testing on Accession #: [id number].

## 2014-06-30 ENCOUNTER — Other Ambulatory Visit (HOSPITAL_COMMUNITY)
Admission: RE | Admit: 2014-06-30 | Discharge: 2014-06-30 | Disposition: A | Discharge status: Home / Self Care | Payer: Medicare Other | Source: Ambulatory Visit | Attending: Oncology | Admitting: Oncology

## 2014-06-30 DIAGNOSIS — C189 Malignant neoplasm of colon, unspecified: Secondary | ICD-10-CM | POA: Insufficient documentation

## 2014-07-01 ENCOUNTER — Encounter: Payer: Self-pay | Admitting: Radiation Oncology

## 2014-07-06 ENCOUNTER — Encounter (HOSPITAL_COMMUNITY): Payer: Self-pay

## 2014-07-13 ENCOUNTER — Other Ambulatory Visit (HOSPITAL_BASED_OUTPATIENT_CLINIC_OR_DEPARTMENT_OTHER): Payer: Medicare Other

## 2014-07-13 ENCOUNTER — Ambulatory Visit (HOSPITAL_BASED_OUTPATIENT_CLINIC_OR_DEPARTMENT_OTHER): Payer: Medicare Other | Admitting: Oncology

## 2014-07-13 ENCOUNTER — Telehealth: Payer: Self-pay | Admitting: Oncology

## 2014-07-13 VITALS — BP 120/66 | HR 64 | Temp 98.5°F | Resp 18 | Ht 67.0 in | Wt 164.6 lb

## 2014-07-13 DIAGNOSIS — R109 Unspecified abdominal pain: Secondary | ICD-10-CM

## 2014-07-13 DIAGNOSIS — C786 Secondary malignant neoplasm of retroperitoneum and peritoneum: Secondary | ICD-10-CM

## 2014-07-13 DIAGNOSIS — C185 Malignant neoplasm of splenic flexure: Secondary | ICD-10-CM

## 2014-07-13 DIAGNOSIS — C3410 Malignant neoplasm of upper lobe, unspecified bronchus or lung: Secondary | ICD-10-CM

## 2014-07-13 DIAGNOSIS — C189 Malignant neoplasm of colon, unspecified: Secondary | ICD-10-CM

## 2014-07-13 DIAGNOSIS — E119 Type 2 diabetes mellitus without complications: Secondary | ICD-10-CM

## 2014-07-13 DIAGNOSIS — G622 Polyneuropathy due to other toxic agents: Secondary | ICD-10-CM

## 2014-07-13 DIAGNOSIS — T451X5A Adverse effect of antineoplastic and immunosuppressive drugs, initial encounter: Secondary | ICD-10-CM

## 2014-07-13 NOTE — Telephone Encounter (Signed)
gv and printed appts ched adn avs for ptf or Aug....gv pt barium

## 2014-07-13 NOTE — Progress Notes (Signed)
[place] OFFICE PROGRESS NOTE   Diagnosis: Colon cancer  INTERVAL HISTORY:   [name] returns as scheduled. His appetite is improved compared to when he was here 2 weeks ago. He continues to have intermittent abdominal pain. He takes hydrocodone approximately once daily. The pain is slightly improved compared to preradiation. Mild neuropathy symptoms.  Objective:  Vital signs in last 24 hours:  Blood pressure 120/66, pulse 64, temperature 98.5 F (36.9 C), temperature source Oral, resp. rate 18, height 5' 7"  (1.702 m), weight 164 lb 9.6 oz (74.662 kg), SpO2 100.00%.    HEENT: Neck without mass Lymphatics: No cervical, supra-clavicular, or axillary nodes Resp: Lungs clear bilaterally Cardio: Regular rate and rhythm GI: No hepatomegaly, nontender, no mass Vascular: No leg edema    Portacath/PICC-without erythema  Lab Results:   Lab Results  Component Value Date   CEA 32.4* 05/12/2014    Medications: I have reviewed the patient's current medications.  Assessment/Plan: Assessment/Plan:  1. 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  Initially diagnosed with stage III colon cancer July 2012 status post left colectomy 07/09/2011. Microsatellite stable K-ras mutation not detected on initial testing. Extended testing returned negative for the K-ras mutation.  Adjuvant Xeloda initiated July 2012.  Rise in the CEA tumor marker and 2 intra-abdominal soft tissue densities on CT scan November 2012 while on adjuvant Xeloda chemotherapy.  Oxaliplatin and Avastin were added to the regimen beginning 11/26/2011 with a decrease in the CEA and continued through 04/23/2012. Oxaliplatin discontinued at that time due to cumulative neurotoxicity.  Xeloda and Avastin continued.  Restaging CT evaluation 04/09/2013 showed an increase in the previously noted peritoneal implants.  FOLFIRI initiated 04/29/2013. Irinotecan dose reduced following cycle 1 due to mucositis.   Avastin added beginning with cycle 3.  Restaging CT evaluation 07/27/2013 (after 6 cycles) showed interval resolution of a previously described left lower lobe nodule. Interval improvement in peritoneal nodularity. He completed a total of 14 cycles through 11/23/2013. Treatment was adjusted to [facility] following an office visit 12/07/2013 due to a significant decline in his performance status.  Avastin placed on hold beginning 03/31/2014 due to increased urine protein. 24-hour urine on 04/02/2014 showed 814 mg of protein.  03/31/2014 CEA mildly increased at 20.3 as compared to 15.6 on 03/03/2014 and 10.4 on 02/03/2014.  Xeloda placed on hold beginning 04/14/2014 pending upcoming CT scans.  CT scan chest/abdomen/pelvis 04/29/2014 showed an enlarging stomach mass involving the anterior wall in the antral region measuring approximately 5 cm. The mass was narrowing the gastric lumen. Peritoneal implants in the left upper quadrant were noted to be slightly more enlarged.  Upper endoscopy 05/04/2014 with findings of a 5 x 5 cm mass in the gastric antrum. Biopsy showed metastatic adenocarcinoma.  Initiation of gastric radiation and concurrent Xeloda 05/25/2014; completed 06/14/2014. 2. Status post Port-A-Cath placement 04/27/2013. 3. Status post resection of a stage I non-small cell lung cancer left upper lung with postop brachytherapy June 2007. 4. 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 Radiographic complete regression on Avastin based chemotherapy. 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 6. Type 2 diabetes. 7. History of nephrolithiasis. 8. Status post bilateral corneal transplants. 9. Increased urine protein on Avastin. 10. Intermittent upper abdominal pain secondary to gastric mass.   Disposition:  He appears stable. The tumor  return microsatellite stable  so he will not be a candidate for PD1 therapy. We discussed observation and irinotecan/panitumumab. He is most comfortable with observation for now and scheduling a restaging CT.  He will be scheduled for a restaging CT an office visit in approximately 2 weeks. We will then decide on continuing observation versus beginning irinotecan/panitumumab.  Betsy Coder, MD  07/13/2014  1:09 PM

## 2014-07-14 LAB — CEA: CEA: 41 ng/mL — AB (ref 0.0–5.0)

## 2014-07-15 ENCOUNTER — Ambulatory Visit
Admission: RE | Admit: 2014-07-15 | Discharge: 2014-07-15 | Disposition: A | Discharge status: Home / Self Care | Payer: Medicare Other | Source: Ambulatory Visit | Attending: Radiation Oncology | Admitting: Radiation Oncology

## 2014-07-15 VITALS — BP 138/66 | HR 94 | Temp 97.5°F | Resp 20 | Wt 165.7 lb

## 2014-07-15 DIAGNOSIS — C799 Secondary malignant neoplasm of unspecified site: Principal | ICD-10-CM

## 2014-07-15 DIAGNOSIS — C189 Malignant neoplasm of colon, unspecified: Secondary | ICD-10-CM

## 2014-07-15 HISTORY — DX: Personal history of irradiation: Z92.3

## 2014-07-15 NOTE — Progress Notes (Signed)
Radiation Oncology         [phone] ________________________________  Name: [name] MRN: [id number]  Date: 07/15/2014  DOB: 1941-08-28  Follow-Up Visit Note  CC: Jerlyn Ly, MD  Jerlyn Ly, MD  Diagnosis:   Metastatic colon cancer  Interval Since Last Radiation:  One month   Narrative:  The patient returns today for routine follow-up.  The patient feels that he has done well overall. He states that he has abdominal pain approximately one to 2 times per day. Pain medicine including hydrocodone works well for this. His pain is improved somewhat as compared to prior to radiation treatment but he still rates this as a 5-6/10. He denies any diarrhea or nausea. He states his appetite is good. He denies any black stools.                              ALLERGIES:  has No Known Allergies.  Meds: Current Outpatient Prescriptions  Medication Sig Dispense Refill  . amoxicillin (AMOXIL) 500 MG capsule Only takes with Dental procedures      . aspirin 81 MG tablet Take 81 mg by mouth daily.       . capecitabine (XELODA) 500 MG tablet Take 3 tablets (1,500 mg total) by mouth 2 (two) times daily after a meal. Take on days of radiation only. (Mon-Fri)  58 tablet  0  . Cholecalciferol (VITAMIN D3) 2000 UNITS TABS Take 1 tablet by mouth daily.       . cyanocobalamin 500 MCG tablet Take 1,000 mcg by mouth daily.       . dorzolamide-timolol (COSOPT) 22.3-6.8 MG/ML ophthalmic solution Place 1 drop into the left eye 2 (two) times daily.       Marland Kitchen ezetimibe-simvastatin (VYTORIN) 10-40 MG per tablet 0.5 tablets 3 (three) times a week. 1/2 tab 4-5 times per week      . HYDROcodone-acetaminophen (NORCO/VICODIN) 5-325 MG per tablet 1-2 po q 4h prn pain  75 tablet  0  . ibuprofen (ADVIL,MOTRIN) 200 MG tablet Take 200 mg by mouth every 6 (six) hours as needed. Takes 2 tablets QOD      . loperamide (IMODIUM A-D) 2 MG tablet Take by mouth as needed for diarrhea or loose stools. Take 2 tablets at the first  sign of diarrhea then take one every 2 hours. Take 2 tablets at bedtime. Stop when you have not had a loose stool in twelve hours.      Marland Kitchen loratadine (CLARITIN) 10 MG tablet Take 10 mg by mouth 2 (two) times daily.        Marland Kitchen LORazepam (ATIVAN) 0.5 MG tablet Take one to two at bedtime for sleep as needed  60 tablet  1  . loteprednol (LOTEMAX) 0.5 % ophthalmic suspension Place 1 drop into the left eye daily.       . metFORMIN (GLUMETZA) 1000 MG (MOD) 24 hr tablet Take 1,000 mg by mouth daily after supper.       . Omega-3 Fatty Acids (FISH OIL) 1000 MG CAPS Take 6 capsules by mouth daily.       . ondansetron (ZOFRAN) 8 MG tablet Take 1 tablet (8 mg total) by mouth every 12 (twelve) hours as needed for nausea.  30 tablet  1  . oxyCODONE-acetaminophen (PERCOCET/ROXICET) 5-325 MG per tablet       . pantoprazole (PROTONIX) 40 MG tablet Take 1 tablet (40 mg total) by mouth daily.  [place]  tablet  3  . testosterone enanthate (DELATESTRYL) 200 MG/ML injection Inject 500 mg into the muscle every 21 ( twenty-one) days. Every 3 weeks currently       No current facility-administered medications for this encounter.    Physical Findings: The patient is in no acute distress. Patient is alert and oriented.  weight is 165 lb 11.2 oz (75.161 kg). His oral temperature is 97.5 F (36.4 C). His blood pressure is 138/66 and his pulse is 94. His respiration is 20. .     Lab Findings: Lab Results  Component Value Date   WBC 11.0* 06/14/2014   HGB 15.7 06/14/2014   HCT 48.3 06/14/2014   MCV 89.8 06/14/2014   PLT 160 06/14/2014     Radiographic Findings: No results found.  Impression:    The patient did well with treatment. His abdominal pain is improved although he does continue to have some difficulty in this regard. Good pain control with current pain medicine. He is due for an upcoming CT scan of the abdomen in the near future.Given his radiation treatment, there would be room if necessary to give additional radiation  although I discussed with him that he was given a dose of radiation which we would typically expect to do a local control for a radiosensitive tumor.  Plan:    We discussed followup options. The patient prefers to have followup on a when necessary basis. He is comfortable with close followup with Dr. Benay Spice.   Jodelle Gross, M.D., Ph.D.

## 2014-07-15 NOTE — Progress Notes (Signed)
Patient states he has abdominal pain 2-3 x a day, alternates taking Ibuprofen with Hydrocodone. He states his appetite and energy level is good. He denies nausea, diarrhea.

## 2014-07-20 ENCOUNTER — Telehealth: Payer: Self-pay | Admitting: Nurse Practitioner

## 2014-07-20 NOTE — Telephone Encounter (Signed)
Pt lft msg for someone to call him but did not say why. I called pt back lft msg with information confirming his apts coming up in August, advised pt if he has any ?'s to call our office back...KJ

## 2014-07-27 ENCOUNTER — Encounter (HOSPITAL_COMMUNITY): Payer: Self-pay

## 2014-07-27 ENCOUNTER — Ambulatory Visit (HOSPITAL_BASED_OUTPATIENT_CLINIC_OR_DEPARTMENT_OTHER): Payer: Medicare Other

## 2014-07-27 ENCOUNTER — Other Ambulatory Visit: Payer: Medicare Other

## 2014-07-27 ENCOUNTER — Ambulatory Visit (HOSPITAL_COMMUNITY)
Admission: RE | Admit: 2014-07-27 | Discharge: 2014-07-27 | Disposition: A | Discharge status: Home / Self Care | Payer: Medicare Other | Source: Ambulatory Visit | Attending: Oncology | Admitting: Oncology

## 2014-07-27 ENCOUNTER — Other Ambulatory Visit (HOSPITAL_BASED_OUTPATIENT_CLINIC_OR_DEPARTMENT_OTHER): Payer: Medicare Other

## 2014-07-27 VITALS — BP 141/66 | HR 76 | Temp 97.5°F

## 2014-07-27 DIAGNOSIS — C189 Malignant neoplasm of colon, unspecified: Secondary | ICD-10-CM | POA: Diagnosis not present

## 2014-07-27 DIAGNOSIS — J438 Other emphysema: Secondary | ICD-10-CM | POA: Diagnosis not present

## 2014-07-27 DIAGNOSIS — J984 Other disorders of lung: Secondary | ICD-10-CM | POA: Insufficient documentation

## 2014-07-27 DIAGNOSIS — C3410 Malignant neoplasm of upper lobe, unspecified bronchus or lung: Secondary | ICD-10-CM

## 2014-07-27 DIAGNOSIS — C786 Secondary malignant neoplasm of retroperitoneum and peritoneum: Secondary | ICD-10-CM

## 2014-07-27 DIAGNOSIS — C185 Malignant neoplasm of splenic flexure: Secondary | ICD-10-CM

## 2014-07-27 DIAGNOSIS — K669 Disorder of peritoneum, unspecified: Secondary | ICD-10-CM | POA: Diagnosis not present

## 2014-07-27 DIAGNOSIS — Z95828 Presence of other vascular implants and grafts: Secondary | ICD-10-CM

## 2014-07-27 LAB — COMPREHENSIVE METABOLIC PANEL (CC13)
ALT: 18 U/L (ref 0–55)
ANION GAP: 7 meq/L (ref 3–11)
AST: 18 U/L (ref 5–34)
Albumin: 3.3 g/dL — ABNORMAL LOW (ref 3.5–5.0)
Alkaline Phosphatase: 83 U/L (ref 40–150)
BUN: 18.3 mg/dL (ref 7.0–26.0)
CO2: 26 meq/L (ref 22–29)
Calcium: 10.3 mg/dL (ref 8.4–10.4)
Chloride: 104 mEq/L (ref 98–109)
Creatinine: 1.1 mg/dL (ref 0.7–1.3)
GLUCOSE: 138 mg/dL (ref 70–140)
POTASSIUM: 4.5 meq/L (ref 3.5–5.1)
Sodium: 138 mEq/L (ref 136–145)
TOTAL PROTEIN: 6.5 g/dL (ref 6.4–8.3)
Total Bilirubin: 0.78 mg/dL (ref 0.20–1.20)

## 2014-07-27 LAB — CBC WITH DIFFERENTIAL/PLATELET
BASO%: 0.8 % (ref 0.0–2.0)
Basophils Absolute: 0.1 10*3/uL (ref 0.0–0.1)
EOS ABS: 0.7 10*3/uL — AB (ref 0.0–0.5)
EOS%: 7 % (ref 0.0–7.0)
HCT: 46.3 % (ref 38.4–49.9)
HGB: 15.2 g/dL (ref 13.0–17.1)
LYMPH#: 0.8 10*3/uL — AB (ref 0.9–3.3)
LYMPH%: 7.3 % — AB (ref 14.0–49.0)
MCH: 28.9 pg (ref 27.2–33.4)
MCHC: 32.8 g/dL (ref 32.0–36.0)
MCV: 88.1 fL (ref 79.3–98.0)
MONO#: 0.7 10*3/uL (ref 0.1–0.9)
MONO%: 7 % (ref 0.0–14.0)
NEUT%: 77.9 % — ABNORMAL HIGH (ref 39.0–75.0)
NEUTROS ABS: 8.1 10*3/uL — AB (ref 1.5–6.5)
Platelets: 167 10*3/uL (ref 140–400)
RBC: 5.25 10*6/uL (ref 4.20–5.82)
RDW: 20 % — ABNORMAL HIGH (ref 11.0–14.6)
WBC: 10.5 10*3/uL — AB (ref 4.0–10.3)

## 2014-07-27 LAB — LACTATE DEHYDROGENASE (CC13): LDH: 211 U/L (ref 125–245)

## 2014-07-27 MED ORDER — SODIUM CHLORIDE 0.9 % IJ SOLN
10.0000 mL | INTRAMUSCULAR | Status: DC | PRN
  Administered 2014-07-27: 10 mL via INTRAVENOUS
  Filled 2014-07-27: qty 10

## 2014-07-27 MED ORDER — IOHEXOL 300 MG/ML  SOLN
100.0000 mL | Freq: Once | INTRAMUSCULAR | Status: AC | PRN
  Administered 2014-07-27: 100 mL via INTRAVENOUS

## 2014-07-27 MED ORDER — HEPARIN SOD (PORK) LOCK FLUSH 100 UNIT/ML IV SOLN
500.0000 [IU] | Freq: Once | INTRAVENOUS | Status: AC
Start: 2014-07-27 — End: 2014-07-27
  Administered 2014-07-27: 500 [IU] via INTRAVENOUS
  Filled 2014-07-27: qty 5

## 2014-07-27 NOTE — Patient Instructions (Signed)

## 2014-07-28 ENCOUNTER — Telehealth: Payer: Self-pay | Admitting: Oncology

## 2014-07-28 ENCOUNTER — Ambulatory Visit (HOSPITAL_BASED_OUTPATIENT_CLINIC_OR_DEPARTMENT_OTHER): Payer: Medicare Other | Admitting: Nurse Practitioner

## 2014-07-28 VITALS — BP 131/64 | HR 85 | Temp 97.7°F | Resp 18 | Ht 67.0 in | Wt 168.5 lb

## 2014-07-28 DIAGNOSIS — G620 Drug-induced polyneuropathy: Secondary | ICD-10-CM

## 2014-07-28 DIAGNOSIS — C185 Malignant neoplasm of splenic flexure: Secondary | ICD-10-CM

## 2014-07-28 DIAGNOSIS — C189 Malignant neoplasm of colon, unspecified: Secondary | ICD-10-CM

## 2014-07-28 DIAGNOSIS — R109 Unspecified abdominal pain: Secondary | ICD-10-CM

## 2014-07-28 DIAGNOSIS — C786 Secondary malignant neoplasm of retroperitoneum and peritoneum: Secondary | ICD-10-CM

## 2014-07-28 DIAGNOSIS — C799 Secondary malignant neoplasm of unspecified site: Principal | ICD-10-CM

## 2014-07-28 LAB — CEA: CEA: 48 ng/mL — AB (ref 0.0–5.0)

## 2014-07-28 MED ORDER — MINOCYCLINE HCL 100 MG PO CAPS: 100.0000 mg | ORAL_CAPSULE | Freq: Two times a day (BID) | ORAL | Status: DC

## 2014-07-28 NOTE — Progress Notes (Addendum)
[place] OFFICE PROGRESS NOTE   Diagnosis:  Colon cancer.  INTERVAL HISTORY:   [name] returns as scheduled. He continues to have intermittent upper abdominal pain. He reports the pain is mostly controlled with hydrocodone and ibuprofen. He rates the pain 3 or 4 on the pain scale. He denies nausea/vomiting. No diarrhea or constipation. He has noted a "bump" at the upper mid abdomen. He has a good appetite. He reports he'll be undergoing some tooth extractions in the next few weeks.  Objective:  Vital signs in last 24 hours:  Blood pressure 131/64, pulse 85, temperature 97.7 F (36.5 C), temperature source Oral, resp. rate 18, height 5' 7"  (1.702 m), weight 168 lb 8 oz (76.431 kg), SpO2 94.00%.    HEENT: No thrush or ulcerations. Resp: Lungs clear. No wheezes or rales. Cardio: Regular rate and rhythm. GI: Abdomen soft and nontender. Soft reducible midline hernia located superior to the umbilicus. Vascular: No leg edema.   Port-A-Cath site without erythema.    Lab Results:  Lab Results  Component Value Date   WBC 10.5* 07/27/2014   HGB 15.2 07/27/2014   HCT 46.3 07/27/2014   MCV 88.1 07/27/2014   PLT 167 07/27/2014   NEUTROABS 8.1* 07/27/2014    Imaging:  Ct Chest W Contrast  07/27/2014   CLINICAL DATA:  Followup colon adenocarcinoma.  Restaging.  EXAM: CT CHEST, ABDOMEN, AND PELVIS WITH CONTRAST  TECHNIQUE: Multidetector CT imaging of the chest, abdomen and pelvis was performed following the standard protocol during bolus administration of intravenous contrast.  CONTRAST:  113m OMNIPAQUE IOHEXOL 300 MG/ML  SOLN  COMPARISON:  04/29/2014  FINDINGS: CT CHEST FINDINGS  No evidence of mediastinal or hilar soft tissue masses. No adenopathy seen elsewhere within the thorax. No evidence of pleural or pericardial effusion.  Moderate to severe pulmonary emphysema is demonstrated. Brachytherapy seeds associated scarring again seen in the left upper lobe and stable in appearance.  Right upper lobe scarring is also stable. No suspicious pulmonary nodules or masses are identified. There is no evidence of acute infiltrate or central endobronchial lesion. No suspicious bone lesions identified.  CT ABDOMEN AND PELVIS FINDINGS  Soft tissue mass along the anterior wall of the gastric antrum shows interval decrease in size, currently measuring 2.2 x 4.0 cm on image 56 series 2 compared with 2.8 x 4.4 cm previously. There is no evidence of gastric dilatation.  Several soft tissue nodules are seen all involving the omentum which are mildly increased in size since previous study. Left upper quadrant nodule on image 64 currently measures 1.7 x 3.0 cm compared to 1.3 x 1.6 cm previously. Another omental nodule in the anterior abdomen measures 1.8 cm on image 65 compared to 1.2 cm previously. Slight increase in size of another nodule is seen in Morison's pouch along the posterior margin of the right hepatic lobe, currently measuring 1.5 cm on image 69 compared to 1.2 cm previously. There is no evidence of ascites.  The liver, gallbladder, pancreas, spleen, adrenal glands, and kidneys are unremarkable in appearance. No inflammatory process or abnormal fluid collections identified. No suspicious bone lesions identified. Bilateral L5 pars defects and grade 1 anterolisthesis again noted.  IMPRESSION: Mild increase in size of several small abdominal peritoneal metastases.  Mild decrease in size of soft tissue mass involving the anterior wall of the gastric antrum.  No new sites of metastatic disease identified. No evidence of thoracic metastatic disease.  Moderate to severe emphysema.   Electronically Signed  By: Earle Gell M.D.   On: 07/27/2014 17:47   Ct Abdomen Pelvis W Contrast  07/27/2014   CLINICAL DATA:  Followup colon adenocarcinoma.  Restaging.  EXAM: CT CHEST, ABDOMEN, AND PELVIS WITH CONTRAST  TECHNIQUE: Multidetector CT imaging of the chest, abdomen and pelvis was performed following the  standard protocol during bolus administration of intravenous contrast.  CONTRAST:  122m OMNIPAQUE IOHEXOL 300 MG/ML  SOLN  COMPARISON:  04/29/2014  FINDINGS: CT CHEST FINDINGS  No evidence of mediastinal or hilar soft tissue masses. No adenopathy seen elsewhere within the thorax. No evidence of pleural or pericardial effusion.  Moderate to severe pulmonary emphysema is demonstrated. Brachytherapy seeds associated scarring again seen in the left upper lobe and stable in appearance. Right upper lobe scarring is also stable. No suspicious pulmonary nodules or masses are identified. There is no evidence of acute infiltrate or central endobronchial lesion. No suspicious bone lesions identified.  CT ABDOMEN AND PELVIS FINDINGS  Soft tissue mass along the anterior wall of the gastric antrum shows interval decrease in size, currently measuring 2.2 x 4.0 cm on image 56 series 2 compared with 2.8 x 4.4 cm previously. There is no evidence of gastric dilatation.  Several soft tissue nodules are seen all involving the omentum which are mildly increased in size since previous study. Left upper quadrant nodule on image 64 currently measures 1.7 x 3.0 cm compared to 1.3 x 1.6 cm previously. Another omental nodule in the anterior abdomen measures 1.8 cm on image 65 compared to 1.2 cm previously. Slight increase in size of another nodule is seen in Morison's pouch along the posterior margin of the right hepatic lobe, currently measuring 1.5 cm on image 69 compared to 1.2 cm previously. There is no evidence of ascites.  The liver, gallbladder, pancreas, spleen, adrenal glands, and kidneys are unremarkable in appearance. No inflammatory process or abnormal fluid collections identified. No suspicious bone lesions identified. Bilateral L5 pars defects and grade 1 anterolisthesis again noted.  IMPRESSION: Mild increase in size of several small abdominal peritoneal metastases.  Mild decrease in size of soft tissue mass involving the  anterior wall of the gastric antrum.  No new sites of metastatic disease identified. No evidence of thoracic metastatic disease.  Moderate to severe emphysema.   Electronically Signed   By: JEarle GellM.D.   On: 07/27/2014 17:47    Medications: I have reviewed the patient's current medications.  Assessment/Plan: 1. 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  Initially diagnosed with stage III colon cancer July 2012 status post left colectomy 07/09/2011. Microsatellite stable  K-ras mutation not detected on initial testing. Extended testing returned negative for the K-ras mutation.  Adjuvant Xeloda initiated July 2012.  Rise in the CEA tumor marker and 2 intra-abdominal soft tissue densities on CT scan November 2012 while on adjuvant Xeloda chemotherapy.  Oxaliplatin and Avastin were added to the regimen beginning 11/26/2011 with a decrease in the CEA and continued through 04/23/2012. Oxaliplatin discontinued at that time due to cumulative neurotoxicity.  Xeloda and Avastin continued.  Restaging CT evaluation 04/09/2013 showed an increase in the previously noted peritoneal implants.  FOLFIRI initiated 04/29/2013. Irinotecan dose reduced following cycle 1 due to mucositis.  Avastin added beginning with cycle 3.  Restaging CT evaluation 07/27/2013 (after 6 cycles) showed interval resolution of a previously described left lower lobe nodule. Interval improvement in peritoneal nodularity. He completed a total of 14 cycles through 11/23/2013. [facility] due to a significant decline in his performance status.  Avastin placed on hold beginning 03/31/2014 due to increased urine protein. 24-hour urine on 04/02/2014 showed 814 mg of protein.  03/31/2014 CEA mildly increased at 20.3 as compared to 15.6 on 03/03/2014 and 10.4 on 02/03/2014.  Xeloda placed on hold beginning 04/14/2014 pending upcoming CT scans.  CT scan  chest/abdomen/pelvis 04/29/2014 showed an enlarging stomach mass involving the anterior wall in the antral region measuring approximately 5 cm. The mass was narrowing the gastric lumen. Peritoneal implants in the left upper quadrant were noted to be slightly more enlarged.  Upper endoscopy 05/04/2014 with findings of a 5 x 5 cm mass in the gastric antrum. Biopsy showed metastatic adenocarcinoma.  Initiation of gastric radiation and concurrent Xeloda 05/25/2014; completed 06/14/2014. Restaging CT scans chest/abdomen/pelvis 07/27/2014 with mild increase in the size of several small abdominal peritoneal metastases and mild decrease in size of the soft tissue mass involving the anterior wall of the gastric antrum. No new sites of metastatic disease identified. No evidence of metastatic disease involving the chest. Moderate to severe emphysema. 2. Status post Port-A-Cath placement 04/27/2013. 3. Status post resection of a stage I non-small cell lung cancer left upper lung with postop brachytherapy June 2007. 4. 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 Radiographic complete regression on Avastin based chemotherapy. 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 6. Type 2 diabetes. 7. History of nephrolithiasis. 8. Status post bilateral corneal transplants. 9. Increased urine protein on Avastin. 10. Intermittent upper abdominal pain secondary to gastric mass.   Disposition: [name] appears stable. He continues to have intermittent upper abdominal pain. Dr. Benay Spice reviewed the restaging CT results with [name]. He understands there is evidence of disease progression and that no therapy will be curative. He would like to proceed with systemic therapy. Dr. Benay Spice recommends irinotecan/Panitumumab on a 2 week schedule. We reviewed potential toxicities  associated with irinotecan including myelosuppression, nausea, diarrhea (possibly severe), hair loss. We reviewed potential toxicities associated with Panitumumab including a skin rash, diarrhea, allergic reaction. A prescription was sent to his pharmacy for minocycline 100 mg twice daily.  He will return to begin the first cycle of irinotecan/Panitumumab on 08/04/2014. We will see him in followup on 08/18/2014. He will contact the office in the interim with any problems.  Patient seen with Dr. Benay Spice. 25 minutes were spent face-to-face at today's visit with the majority of that time involved in counseling/coordination of care.    Ned Card ANP/GNP-BC   07/28/2014  1:47 PM  This was a shared visit with Ned Card. We discussed the restaging CT findings with [name]. He would like to proceed with salvage systemic therapy. I recommend irinotecan/panitumumab.  We reviewed the toxicities associated with this regimen. A first cycle will be scheduled for 08/04/2014.  Julieanne Manson, M.D.

## 2014-07-28 NOTE — Telephone Encounter (Signed)
gv and printed appt scehd and avs for pt for Aug and SEpt....sed added tx.

## 2014-08-01 ENCOUNTER — Other Ambulatory Visit: Payer: Self-pay | Admitting: Oncology

## 2014-08-04 ENCOUNTER — Other Ambulatory Visit: Payer: Self-pay | Admitting: *Deleted

## 2014-08-04 ENCOUNTER — Ambulatory Visit (HOSPITAL_BASED_OUTPATIENT_CLINIC_OR_DEPARTMENT_OTHER): Payer: Medicare Other

## 2014-08-04 VITALS — BP 144/69 | HR 80 | Temp 97.8°F

## 2014-08-04 DIAGNOSIS — Z5111 Encounter for antineoplastic chemotherapy: Secondary | ICD-10-CM

## 2014-08-04 DIAGNOSIS — C189 Malignant neoplasm of colon, unspecified: Secondary | ICD-10-CM

## 2014-08-04 DIAGNOSIS — C185 Malignant neoplasm of splenic flexure: Secondary | ICD-10-CM

## 2014-08-04 MED ORDER — ONDANSETRON 8 MG/NS 50 ML IVPB
INTRAVENOUS | Status: AC
  Filled 2014-08-04: qty 8

## 2014-08-04 MED ORDER — DEXAMETHASONE SODIUM PHOSPHATE 10 MG/ML IJ SOLN
10.0000 mg | Freq: Once | INTRAMUSCULAR | Status: AC
  Administered 2014-08-04: 10 mg via INTRAVENOUS

## 2014-08-04 MED ORDER — SODIUM CHLORIDE 0.9 % IV SOLN
Freq: Once | INTRAVENOUS | Status: AC
  Administered 2014-08-04: 11:00:00 via INTRAVENOUS

## 2014-08-04 MED ORDER — IRINOTECAN HCL CHEMO INJECTION 100 MG/5ML
135.0000 mg/m2 | Freq: Once | INTRAVENOUS | Status: AC
  Administered 2014-08-04: 240 mg via INTRAVENOUS
  Filled 2014-08-04: qty 12

## 2014-08-04 MED ORDER — ATROPINE SULFATE 1 MG/ML IJ SOLN
0.5000 mg | Freq: Once | INTRAMUSCULAR | Status: AC | PRN
  Administered 2014-08-04: 0.5 mg via INTRAVENOUS

## 2014-08-04 MED ORDER — ATROPINE SULFATE 1 MG/ML IJ SOLN
INTRAMUSCULAR | Status: AC
  Filled 2014-08-04: qty 1

## 2014-08-04 MED ORDER — HEPARIN SOD (PORK) LOCK FLUSH 100 UNIT/ML IV SOLN
500.0000 [IU] | Freq: Once | INTRAVENOUS | Status: AC | PRN
  Administered 2014-08-04: 500 [IU]
  Filled 2014-08-04: qty 5

## 2014-08-04 MED ORDER — ONDANSETRON 8 MG/50ML IVPB (CHCC)
8.0000 mg | Freq: Once | INTRAVENOUS | Status: AC
  Administered 2014-08-04: 8 mg via INTRAVENOUS

## 2014-08-04 MED ORDER — DEXAMETHASONE SODIUM PHOSPHATE 10 MG/ML IJ SOLN
INTRAMUSCULAR | Status: AC
  Filled 2014-08-04: qty 1

## 2014-08-04 MED ORDER — SODIUM CHLORIDE 0.9 % IV SOLN
6.0000 mg/kg | Freq: Once | INTRAVENOUS | Status: AC
  Administered 2014-08-04: 460 mg via INTRAVENOUS
  Filled 2014-08-04: qty 23

## 2014-08-04 MED ORDER — SODIUM CHLORIDE 0.9 % IJ SOLN
10.0000 mL | INTRAMUSCULAR | Status: DC | PRN
  Administered 2014-08-04: 10 mL
  Filled 2014-08-04: qty 10

## 2014-08-04 NOTE — Patient Instructions (Signed)
[facility] Discharge Instructions for Patients Receiving Chemotherapy  Today you received the following chemotherapy agents irinotecan/vectibix  To help prevent nausea and vomiting after your treatment, we encourage you to take your nausea medication as directed.    If you develop nausea and vomiting that is not controlled by your nausea medication, call the clinic.   BELOW ARE SYMPTOMS THAT SHOULD BE REPORTED IMMEDIATELY:  *FEVER GREATER THAN 100.5 F  *CHILLS WITH OR WITHOUT FEVER  NAUSEA AND VOMITING THAT IS NOT CONTROLLED WITH YOUR NAUSEA MEDICATION  *UNUSUAL SHORTNESS OF BREATH  *UNUSUAL BRUISING OR BLEEDING  TENDERNESS IN MOUTH AND THROAT WITH OR WITHOUT PRESENCE OF ULCERS  *URINARY PROBLEMS  *BOWEL PROBLEMS  UNUSUAL RASH Items with * indicate a potential emergency and should be followed up as soon as possible.  Feel free to call the clinic you have any questions or concerns. The clinic phone number is [phone].  Panitumumab Solution for Injection What is this medicine? PANITUMUMAB (pan i TOOM ue mab) is a chemotherapy drug. It targets a specific protein within cancer cells and stops the cells from growing. It is used to treat colorectal cancer. This medicine may be used for other purposes; ask your health care provider or pharmacist if you have questions. COMMON BRAND NAME(S): Vectibix What should I tell my health care provider before I take this medicine? They need to know if you have any of these conditions: -lung disease, especially lung fibrosis -skin conditions or sensitivity -an unusual or allergic reaction to panitumumab, mouse proteins, other medicines, foods, dyes, or preservatives -pregnant or trying to get pregnant -breast-feeding How should I use this medicine? This drug is given as an infusion into a vein. It is administered in a hospital or clinic by a specially trained health care professional. Talk to your pediatrician regarding  the use of this medicine in children. Special care may be needed. Overdosage: If you think you have taken too much of this medicine contact a poison control center or emergency room at once. NOTE: This medicine is only for you. Do not share this medicine with others. What if I miss a dose? It is important not to miss your dose. Call your doctor or health care professional if you are unable to keep an appointment. What may interact with this medicine? -some medicines for cancer This list may not describe all possible interactions. Give your health care provider a list of all the medicines, herbs, non-prescription drugs, or dietary supplements you use. Also tell them if you smoke, drink alcohol, or use illegal drugs. Some items may interact with your medicine. What should I watch for while using this medicine? Visit your doctor for checks on your progress. This drug may make you feel generally unwell. This is not uncommon, as chemotherapy can affect healthy cells as well as cancer cells. Report any side effects. Continue your course of treatment even though you feel ill unless your doctor tells you to stop. This medicine can make you more sensitive to the sun. Keep out of the sun while receiving this medicine and for 2 months after the last dose. If you cannot avoid being in the sun, wear protective clothing and use sunscreen. Do not use sun lamps or tanning beds/booths. In some cases, you may be given additional medicines to help with side effects. Follow all directions for their use. Call your doctor or health care professional for advice if you get a fever, chills or sore throat, or other symptoms of  a cold or flu. Do not treat yourself. This drug decreases your body's ability to fight infections. Try to avoid being around people who are sick. Avoid taking products that contain aspirin, acetaminophen, ibuprofen, naproxen, or ketoprofen unless instructed by your doctor. These medicines may hide a  fever. Do not become pregnant while taking this medicine and for 6 months after the last dose. Women should inform their doctor if they wish to become pregnant or think they might be pregnant. Men should not father a child while taking this medicine and for 6 months after the last dose. There is a potential for serious side effects to an unborn child. Talk to your health care professional or pharmacist for more information. Do not breast-feed an infant while taking this medicine. What side effects may I notice from receiving this medicine? Side effects that you should report to your doctor or health care professional as soon as possible: -allergic reactions like skin rash, itching or hives, swelling of the face, lips, or tongue -breathing problems -changes in vision -fast, irregular heartbeat -feeling faint or lightheaded, falls -fever, chills -mouth sores -swelling of the ankles, feet, hands -unusually weak or tired Side effects that usually do not require medical attention (report to your doctor or health care professional if they continue or are bothersome): -changes in skin like acne, cracks, skin dryness -constipation -diarrhea -eyelash growth -headache -nail changes -nausea, vomiting -stomach upset This list may not describe all possible side effects. Call your doctor for medical advice about side effects. You may report side effects to FDA at 1-800-FDA-1088. Where should I keep my medicine? This drug is given in a hospital or clinic and will not be stored at home. NOTE: This sheet is a summary. It may not cover all possible information. If you have questions about this medicine, talk to your doctor, pharmacist, or health care provider.  2015, Elsevier/Gold Standard. (2011-05-08 11:00:12)

## 2014-08-05 ENCOUNTER — Telehealth: Payer: Self-pay | Admitting: *Deleted

## 2014-08-05 NOTE — Telephone Encounter (Signed)
Called [name] for chemotherapy F/U.  Patient is doing well.  Denies n/v.  Denies any new side effects or symptoms.  Bowels "are a little constipated but I had this problem before I started the chemotherapy yesterday.  I am working on this and have taken senokot this morning.  Last bm was 08-03-2014.  Bladder is functioning well.  Eating and drinking well.  Instructed to drink 64 oz minimum daily or at least the day before, of and after treatment.  Denies questions at this time and encouraged to call if needed.  Reviewed how to call after hours in the case of an emergency and asked  That he call if bowels do not work despite what he takes.

## 2014-08-05 NOTE — Telephone Encounter (Signed)
Message copied by Cherylynn Ridges on Thu Aug 05, 2014 12:37 PM ------      Message from: Arty Baumgartner      Created: Wed Aug 04, 2014 11:23 AM      Regarding: First time chemo       First time vectibix (has had folfiri in the past)  Dr Benay Spice.  [phone] ------

## 2014-08-11 ENCOUNTER — Telehealth: Payer: Self-pay | Admitting: *Deleted

## 2014-08-11 ENCOUNTER — Other Ambulatory Visit: Payer: Self-pay | Admitting: *Deleted

## 2014-08-11 ENCOUNTER — Other Ambulatory Visit: Payer: Self-pay | Admitting: Internal Medicine

## 2014-08-11 MED ORDER — DIPHENOXYLATE-ATROPINE 2.5-0.025 MG PO TABS: 1.0000 | ORAL_TABLET | Freq: Four times a day (QID) | ORAL | Status: DC | PRN

## 2014-08-11 NOTE — Telephone Encounter (Signed)
Pt reports "I had constipation last Tuesday through Friday then Sat. Sun. & Mon started having diarrhea; had some relief yesterday but today it has started up again"  Pt reports that he has been taking Imodium consistently but today has had 2-3 episodes and thinks it could be related to taking the Minocycline.  States he has lost about 7 lbs since Monday; no appetite, denies fever/vomiting.  Has only taken in coffee and cheese toast today; states "I don't feel like I'm getting dehydrated but I will increase my fluids.  "Should I continue taking the Minocycline?"  Note to Dr. Benay Spice.

## 2014-08-15 ENCOUNTER — Other Ambulatory Visit: Payer: Self-pay | Admitting: Oncology

## 2014-08-18 ENCOUNTER — Telehealth: Payer: Self-pay | Admitting: *Deleted

## 2014-08-18 ENCOUNTER — Ambulatory Visit (HOSPITAL_BASED_OUTPATIENT_CLINIC_OR_DEPARTMENT_OTHER): Payer: Medicare Other | Admitting: Oncology

## 2014-08-18 ENCOUNTER — Other Ambulatory Visit: Payer: Self-pay | Admitting: *Deleted

## 2014-08-18 ENCOUNTER — Encounter: Payer: Self-pay | Admitting: Oncology

## 2014-08-18 ENCOUNTER — Other Ambulatory Visit (HOSPITAL_BASED_OUTPATIENT_CLINIC_OR_DEPARTMENT_OTHER): Payer: Medicare Other

## 2014-08-18 ENCOUNTER — Telehealth: Payer: Self-pay | Admitting: Oncology

## 2014-08-18 ENCOUNTER — Ambulatory Visit (HOSPITAL_BASED_OUTPATIENT_CLINIC_OR_DEPARTMENT_OTHER): Payer: Medicare Other

## 2014-08-18 VITALS — BP 121/61 | HR 79 | Temp 97.8°F | Resp 20 | Ht 67.0 in | Wt 162.3 lb

## 2014-08-18 DIAGNOSIS — C786 Secondary malignant neoplasm of retroperitoneum and peritoneum: Secondary | ICD-10-CM

## 2014-08-18 DIAGNOSIS — C799 Secondary malignant neoplasm of unspecified site: Secondary | ICD-10-CM

## 2014-08-18 DIAGNOSIS — R109 Unspecified abdominal pain: Secondary | ICD-10-CM

## 2014-08-18 DIAGNOSIS — Z5111 Encounter for antineoplastic chemotherapy: Secondary | ICD-10-CM

## 2014-08-18 DIAGNOSIS — C189 Malignant neoplasm of colon, unspecified: Secondary | ICD-10-CM

## 2014-08-18 DIAGNOSIS — Z5112 Encounter for antineoplastic immunotherapy: Secondary | ICD-10-CM

## 2014-08-18 DIAGNOSIS — C185 Malignant neoplasm of splenic flexure: Secondary | ICD-10-CM

## 2014-08-18 DIAGNOSIS — G620 Drug-induced polyneuropathy: Secondary | ICD-10-CM

## 2014-08-18 LAB — CBC WITH DIFFERENTIAL/PLATELET
BASO%: 0.9 % (ref 0.0–2.0)
Basophils Absolute: 0.1 10*3/uL (ref 0.0–0.1)
EOS ABS: 1.1 10*3/uL — AB (ref 0.0–0.5)
EOS%: 11.4 % — ABNORMAL HIGH (ref 0.0–7.0)
HCT: 45.8 % (ref 38.4–49.9)
HGB: 15 g/dL (ref 13.0–17.1)
LYMPH#: 0.8 10*3/uL — AB (ref 0.9–3.3)
LYMPH%: 8.2 % — AB (ref 14.0–49.0)
MCH: 29 pg (ref 27.2–33.4)
MCHC: 32.7 g/dL (ref 32.0–36.0)
MCV: 88.5 fL (ref 79.3–98.0)
MONO#: 0.9 10*3/uL (ref 0.1–0.9)
MONO%: 9.3 % (ref 0.0–14.0)
NEUT%: 70.2 % (ref 39.0–75.0)
NEUTROS ABS: 7 10*3/uL — AB (ref 1.5–6.5)
PLATELETS: 152 10*3/uL (ref 140–400)
RBC: 5.17 10*6/uL (ref 4.20–5.82)
RDW: 18 % — ABNORMAL HIGH (ref 11.0–14.6)
WBC: 9.9 10*3/uL (ref 4.0–10.3)

## 2014-08-18 LAB — COMPREHENSIVE METABOLIC PANEL (CC13)
ALBUMIN: 3 g/dL — AB (ref 3.5–5.0)
ALT: 16 U/L (ref 0–55)
ANION GAP: 10 meq/L (ref 3–11)
AST: 17 U/L (ref 5–34)
Alkaline Phosphatase: 98 U/L (ref 40–150)
BILIRUBIN TOTAL: 0.53 mg/dL (ref 0.20–1.20)
BUN: 14 mg/dL (ref 7.0–26.0)
CALCIUM: 9.9 mg/dL (ref 8.4–10.4)
CHLORIDE: 103 meq/L (ref 98–109)
CO2: 27 meq/L (ref 22–29)
Creatinine: 1.1 mg/dL (ref 0.7–1.3)
Glucose: 151 mg/dl — ABNORMAL HIGH (ref 70–140)
POTASSIUM: 4.2 meq/L (ref 3.5–5.1)
SODIUM: 141 meq/L (ref 136–145)
TOTAL PROTEIN: 5.8 g/dL — AB (ref 6.4–8.3)

## 2014-08-18 LAB — MAGNESIUM (CC13): MAGNESIUM: 1.8 mg/dL (ref 1.5–2.5)

## 2014-08-18 MED ORDER — DEXAMETHASONE SODIUM PHOSPHATE 10 MG/ML IJ SOLN
10.0000 mg | Freq: Once | INTRAMUSCULAR | Status: AC
  Administered 2014-08-18: 10 mg via INTRAVENOUS

## 2014-08-18 MED ORDER — MINOCYCLINE HCL 100 MG PO CAPS: 100.0000 mg | ORAL_CAPSULE | Freq: Two times a day (BID) | ORAL | Status: DC

## 2014-08-18 MED ORDER — DEXAMETHASONE SODIUM PHOSPHATE 10 MG/ML IJ SOLN
INTRAMUSCULAR | Status: AC
  Filled 2014-08-18: qty 1

## 2014-08-18 MED ORDER — IRINOTECAN HCL CHEMO INJECTION 100 MG/5ML
135.0000 mg/m2 | Freq: Once | INTRAVENOUS | Status: AC
  Administered 2014-08-18: 240 mg via INTRAVENOUS
  Filled 2014-08-18: qty 12

## 2014-08-18 MED ORDER — SODIUM CHLORIDE 0.9 % IV SOLN
Freq: Once | INTRAVENOUS | Status: AC
  Administered 2014-08-18: 10:00:00 via INTRAVENOUS

## 2014-08-18 MED ORDER — ATROPINE SULFATE 1 MG/ML IJ SOLN
INTRAMUSCULAR | Status: AC
  Filled 2014-08-18: qty 1

## 2014-08-18 MED ORDER — ATROPINE SULFATE 1 MG/ML IJ SOLN
0.5000 mg | Freq: Once | INTRAMUSCULAR | Status: AC | PRN
  Administered 2014-08-18: 0.5 mg via INTRAVENOUS

## 2014-08-18 MED ORDER — HEPARIN SOD (PORK) LOCK FLUSH 100 UNIT/ML IV SOLN
500.0000 [IU] | Freq: Once | INTRAVENOUS | Status: AC | PRN
Start: 2014-08-18 — End: 2014-08-18
  Administered 2014-08-18: 500 [IU]
  Filled 2014-08-18: qty 5

## 2014-08-18 MED ORDER — PROCHLORPERAZINE MALEATE 10 MG PO TABS: 10.0000 mg | ORAL_TABLET | Freq: Four times a day (QID) | ORAL | Status: AC | PRN

## 2014-08-18 MED ORDER — PALONOSETRON HCL INJECTION 0.25 MG/5ML
INTRAVENOUS | Status: AC
  Filled 2014-08-18: qty 5

## 2014-08-18 MED ORDER — SODIUM CHLORIDE 0.9 % IV SOLN
150.0000 mg | Freq: Once | INTRAVENOUS | Status: AC
  Administered 2014-08-18: 150 mg via INTRAVENOUS
  Filled 2014-08-18: qty 5

## 2014-08-18 MED ORDER — SODIUM CHLORIDE 0.9 % IJ SOLN
10.0000 mL | INTRAMUSCULAR | Status: DC | PRN
  Administered 2014-08-18: 10 mL
  Filled 2014-08-18: qty 10

## 2014-08-18 MED ORDER — SODIUM CHLORIDE 0.9 % IV SOLN
6.0000 mg/kg | Freq: Once | INTRAVENOUS | Status: AC
  Administered 2014-08-18: 460 mg via INTRAVENOUS
  Filled 2014-08-18: qty 23

## 2014-08-18 MED ORDER — ONDANSETRON HCL 8 MG PO TABS: 8.0000 mg | ORAL_TABLET | Freq: Two times a day (BID) | ORAL | Status: DC | PRN

## 2014-08-18 MED ORDER — PALONOSETRON HCL INJECTION 0.25 MG/5ML
0.2500 mg | Freq: Once | INTRAVENOUS | Status: AC
  Administered 2014-08-18: 0.25 mg via INTRAVENOUS

## 2014-08-18 NOTE — Telephone Encounter (Signed)
Per pt request; faxed lab results to Alfred Levins, Brooke Bonito DDS for upcoming dental procedure in September.

## 2014-08-18 NOTE — Progress Notes (Signed)
[facility] OFFICE PROGRESS NOTE   Diagnosis: Colon cancer  INTERVAL HISTORY:   [name] completed a first treatment with irinotecan/panitumumab 08/04/2014. He reports developing diarrhea beginning on day 4. The diarrhea was not relieved with Imodium and lasted for 3 days. The diarrhea resolved prior to when he filled the prescription for Lomotil. He had nausea and dry heaves beginning a few days after chemotherapy. The nausea improved with Zofran. He has not noted a significant skin rash. The abdominal pain has improved. He reports having intermittent hives and developed this today over the lower arms.  Objective:  Vital signs in last 24 hours:  Blood pressure 121/61, pulse 79, temperature 97.8 F (36.6 C), temperature source Oral, resp. rate 20, height 5' 7"  (1.702 m), weight 162 lb 4.8 oz (73.619 kg).    HEENT: Erythema at the pharynx, no ulcers or thrush Resp: Lungs clear bilaterally Cardio: Regular rate and rhythm GI: No hepatomegaly, nontender, no mass Vascular: No leg edema  Skin: Mild acne type rash over the nose, acne form rash over the chest and back, mild confluent erythema at the forearm bilaterally   Portacath/PICC-without erythema  Lab Results:  Lab Results  Component Value Date   WBC 9.9 08/18/2014   HGB 15.0 08/18/2014   HCT 45.8 08/18/2014   MCV 88.5 08/18/2014   PLT 152 08/18/2014   NEUTROABS 7.0* 08/18/2014     Lab Results  Component Value Date   CEA 48.0* 07/27/2014    Imaging:  No results found.  Medications: I have reviewed the patient's current medications.  Assessment/Plan: 1. 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  Initially diagnosed with stage III colon cancer July 2012 status post left colectomy 07/09/2011. Microsatellite stable  K-ras mutation not detected on initial testing. Extended testing returned negative for the K-ras mutation.  Adjuvant Xeloda initiated July 2012.  Rise in the CEA tumor marker and 2  intra-abdominal soft tissue densities on CT scan November 2012 while on adjuvant Xeloda chemotherapy.  Oxaliplatin and Avastin were added to the regimen beginning 11/26/2011 with a decrease in the CEA and continued through 04/23/2012. Oxaliplatin discontinued at that time due to cumulative neurotoxicity.  Xeloda and Avastin continued.  Restaging CT evaluation 04/09/2013 showed an increase in the previously noted peritoneal implants.  FOLFIRI initiated 04/29/2013. Irinotecan dose reduced following cycle 1 due to mucositis.  Avastin added beginning with cycle 3.  Restaging CT evaluation 07/27/2013 (after 6 cycles) showed interval resolution of a previously described left lower lobe nodule. Interval improvement in peritoneal nodularity. He completed a total of 14 cycles through 11/23/2013. Treatment was adjusted to [facility] following an office visit 12/07/2013 due to a significant decline in his performance status.  Avastin placed on hold beginning 03/31/2014 due to increased urine protein. 24-hour urine on 04/02/2014 showed 814 mg of protein.  03/31/2014 CEA mildly increased at 20.3 as compared to 15.6 on 03/03/2014 and 10.4 on 02/03/2014.  Xeloda placed on hold beginning 04/14/2014 pending upcoming CT scans.  CT scan chest/abdomen/pelvis 04/29/2014 showed an enlarging stomach mass involving the anterior wall in the antral region measuring approximately 5 cm. The mass was narrowing the gastric lumen. Peritoneal implants in the left upper quadrant were noted to be slightly more enlarged.  Upper endoscopy 05/04/2014 with findings of a 5 x 5 cm mass in the gastric antrum. Biopsy showed metastatic adenocarcinoma.  Initiation of gastric radiation and concurrent Xeloda 05/25/2014; completed 06/14/2014.  Restaging CT scans chest/abdomen/pelvis 07/27/2014 with mild increase in the size  of several small abdominal peritoneal metastases and mild decrease in size of the soft tissue mass involving the anterior  wall of the gastric antrum. No new sites of metastatic disease identified. No evidence of metastatic disease involving the chest. Moderate to severe emphysema. Cycle 1 irinotecan/panitumumab 08/04/2014 2. Status post Port-A-Cath placement 04/27/2013. 3. Status post resection of a stage I non-small cell lung cancer left upper lung with postop brachytherapy June 2007. 4. 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 Radiographic complete regression on Avastin based chemotherapy. 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 6. Type 2 diabetes. 7. History of nephrolithiasis. 8. Status post bilateral corneal transplants. 9. Increased urine protein on Avastin. 10. Intermittent upper abdominal pain secondary to gastric mass. Improved. 11. Delayed nausea and diarrhea following cycle 1 irinotecan/panitumumab-the anti-emetic premedication was adjusted with cycle 2.   Disposition:  [name] has completed one treatment with irinotecan/panitumumab. He has developed an early panitumumab skin rash. He will continue minocycline. We adjusted the antiemetic premedication regimen today. I encouraged him to use Imodium and Lomotil if he develops diarrhea following this cycle. He will contact us for nausea or diarrhea at that persists. [name] will complete cycle 2 irinotecan/panitumumab today. He will return for an office visit and chemotherapy in 2 weeks.  Betsy Coder, MD  08/18/2014  8:55 AM

## 2014-08-18 NOTE — Progress Notes (Signed)
Faxed ondansetron pa form to [employer] °

## 2014-08-18 NOTE — Telephone Encounter (Signed)
[facility] faxed Prior authorization request for ondansetron.  Request to [employer] for review.

## 2014-08-18 NOTE — Patient Instructions (Signed)
[place] [facility] Discharge Instructions for Patients Receiving Chemotherapy  Today you received the following chemotherapy agents: Vectibix and Irinotecan.  To help prevent nausea and vomiting after your treatment, we encourage you to take your nausea medication: Zofran 8 mgevery8 hours and Lorazepam 0.5mg  as needed.   If you develop nausea and vomiting that is not controlled by your nausea medication, call the clinic.   BELOW ARE SYMPTOMS THAT SHOULD BE REPORTED IMMEDIATELY:  *FEVER GREATER THAN 100.5 F  *CHILLS WITH OR WITHOUT FEVER  NAUSEA AND VOMITING THAT IS NOT CONTROLLED WITH YOUR NAUSEA MEDICATION  *UNUSUAL SHORTNESS OF BREATH  *UNUSUAL BRUISING OR BLEEDING  TENDERNESS IN MOUTH AND THROAT WITH OR WITHOUT PRESENCE OF ULCERS  *URINARY PROBLEMS  *BOWEL PROBLEMS  UNUSUAL RASH Items with * indicate a potential emergency and should be followed up as soon as possible.  Feel free to call the clinic you have any questions or concerns. The clinic phone number is [phone].

## 2014-08-18 NOTE — Telephone Encounter (Signed)
gv adn printed appt sched and avs for pt for Sept...sed added tx.

## 2014-08-19 ENCOUNTER — Encounter: Payer: Self-pay | Admitting: Oncology

## 2014-08-19 NOTE — Progress Notes (Signed)
[employer] approved ondansetron from 08/18/14-08/19/15

## 2014-08-30 ENCOUNTER — Other Ambulatory Visit: Payer: Self-pay | Admitting: Oncology

## 2014-09-01 ENCOUNTER — Ambulatory Visit (HOSPITAL_BASED_OUTPATIENT_CLINIC_OR_DEPARTMENT_OTHER): Payer: Medicare Other

## 2014-09-01 ENCOUNTER — Other Ambulatory Visit (HOSPITAL_BASED_OUTPATIENT_CLINIC_OR_DEPARTMENT_OTHER): Payer: Medicare Other

## 2014-09-01 ENCOUNTER — Ambulatory Visit (HOSPITAL_BASED_OUTPATIENT_CLINIC_OR_DEPARTMENT_OTHER): Payer: Medicare Other | Admitting: Nurse Practitioner

## 2014-09-01 ENCOUNTER — Telehealth: Payer: Self-pay | Admitting: Oncology

## 2014-09-01 VITALS — BP 121/65 | HR 86 | Temp 97.7°F | Resp 18 | Ht 67.0 in | Wt 160.3 lb

## 2014-09-01 DIAGNOSIS — C786 Secondary malignant neoplasm of retroperitoneum and peritoneum: Secondary | ICD-10-CM

## 2014-09-01 DIAGNOSIS — C185 Malignant neoplasm of splenic flexure: Secondary | ICD-10-CM

## 2014-09-01 DIAGNOSIS — C799 Secondary malignant neoplasm of unspecified site: Secondary | ICD-10-CM

## 2014-09-01 DIAGNOSIS — R21 Rash and other nonspecific skin eruption: Secondary | ICD-10-CM

## 2014-09-01 DIAGNOSIS — C189 Malignant neoplasm of colon, unspecified: Secondary | ICD-10-CM

## 2014-09-01 DIAGNOSIS — R109 Unspecified abdominal pain: Secondary | ICD-10-CM

## 2014-09-01 DIAGNOSIS — G620 Drug-induced polyneuropathy: Secondary | ICD-10-CM

## 2014-09-01 DIAGNOSIS — R197 Diarrhea, unspecified: Secondary | ICD-10-CM

## 2014-09-01 DIAGNOSIS — Z5111 Encounter for antineoplastic chemotherapy: Secondary | ICD-10-CM

## 2014-09-01 DIAGNOSIS — Z5112 Encounter for antineoplastic immunotherapy: Secondary | ICD-10-CM

## 2014-09-01 LAB — CBC WITH DIFFERENTIAL/PLATELET
BASO%: 0.5 % (ref 0.0–2.0)
BASOS ABS: 0 10*3/uL (ref 0.0–0.1)
EOS%: 15.8 % — ABNORMAL HIGH (ref 0.0–7.0)
Eosinophils Absolute: 0.9 10*3/uL — ABNORMAL HIGH (ref 0.0–0.5)
HCT: 44.1 % (ref 38.4–49.9)
HEMOGLOBIN: 14.2 g/dL (ref 13.0–17.1)
LYMPH%: 8.5 % — ABNORMAL LOW (ref 14.0–49.0)
MCH: 29 pg (ref 27.2–33.4)
MCHC: 32.2 g/dL (ref 32.0–36.0)
MCV: 90.2 fL (ref 79.3–98.0)
MONO#: 0.8 10*3/uL (ref 0.1–0.9)
MONO%: 13.9 % (ref 0.0–14.0)
NEUT#: 3.5 10*3/uL (ref 1.5–6.5)
NEUT%: 61.3 % (ref 39.0–75.0)
PLATELETS: 108 10*3/uL — AB (ref 140–400)
RBC: 4.89 10*6/uL (ref 4.20–5.82)
RDW: 17.4 % — AB (ref 11.0–14.6)
WBC: 5.8 10*3/uL (ref 4.0–10.3)
lymph#: 0.5 10*3/uL — ABNORMAL LOW (ref 0.9–3.3)

## 2014-09-01 LAB — COMPREHENSIVE METABOLIC PANEL (CC13)
ALBUMIN: 2.9 g/dL — AB (ref 3.5–5.0)
ALT: 18 U/L (ref 0–55)
ANION GAP: 9 meq/L (ref 3–11)
AST: 18 U/L (ref 5–34)
Alkaline Phosphatase: 85 U/L (ref 40–150)
BUN: 17.5 mg/dL (ref 7.0–26.0)
CALCIUM: 9.7 mg/dL (ref 8.4–10.4)
CO2: 26 meq/L (ref 22–29)
Chloride: 109 mEq/L (ref 98–109)
Creatinine: 1 mg/dL (ref 0.7–1.3)
Glucose: 143 mg/dl — ABNORMAL HIGH (ref 70–140)
POTASSIUM: 4.3 meq/L (ref 3.5–5.1)
SODIUM: 143 meq/L (ref 136–145)
TOTAL PROTEIN: 5.7 g/dL — AB (ref 6.4–8.3)
Total Bilirubin: 0.4 mg/dL (ref 0.20–1.20)

## 2014-09-01 LAB — MAGNESIUM (CC13): MAGNESIUM: 1.8 mg/dL (ref 1.5–2.5)

## 2014-09-01 MED ORDER — SODIUM CHLORIDE 0.9 % IV SOLN
6.0000 mg/kg | Freq: Once | INTRAVENOUS | Status: AC
  Administered 2014-09-01: 460 mg via INTRAVENOUS
  Filled 2014-09-01: qty 23

## 2014-09-01 MED ORDER — SODIUM CHLORIDE 0.9 % IJ SOLN
10.0000 mL | INTRAMUSCULAR | Status: DC | PRN
  Administered 2014-09-01: 10 mL
  Filled 2014-09-01: qty 10

## 2014-09-01 MED ORDER — SODIUM CHLORIDE 0.9 % IV SOLN
150.0000 mg | Freq: Once | INTRAVENOUS | Status: AC
  Administered 2014-09-01: 150 mg via INTRAVENOUS
  Filled 2014-09-01: qty 5

## 2014-09-01 MED ORDER — PALONOSETRON HCL INJECTION 0.25 MG/5ML
0.2500 mg | Freq: Once | INTRAVENOUS | Status: AC
  Administered 2014-09-01: 0.25 mg via INTRAVENOUS

## 2014-09-01 MED ORDER — DEXAMETHASONE SODIUM PHOSPHATE 10 MG/ML IJ SOLN
INTRAMUSCULAR | Status: AC
  Filled 2014-09-01: qty 1

## 2014-09-01 MED ORDER — IRINOTECAN HCL CHEMO INJECTION 100 MG/5ML
135.0000 mg/m2 | Freq: Once | INTRAVENOUS | Status: AC
  Administered 2014-09-01: 240 mg via INTRAVENOUS
  Filled 2014-09-01: qty 12

## 2014-09-01 MED ORDER — ATROPINE SULFATE 1 MG/ML IJ SOLN
0.5000 mg | Freq: Once | INTRAMUSCULAR | Status: AC | PRN
Start: 2014-09-01 — End: 2014-09-01
  Administered 2014-09-01: 0.5 mg via INTRAVENOUS

## 2014-09-01 MED ORDER — SODIUM CHLORIDE 0.9 % IV SOLN
Freq: Once | INTRAVENOUS | Status: AC
  Administered 2014-09-01: 12:00:00 via INTRAVENOUS

## 2014-09-01 MED ORDER — ATROPINE SULFATE 1 MG/ML IJ SOLN
INTRAMUSCULAR | Status: AC
  Filled 2014-09-01: qty 1

## 2014-09-01 MED ORDER — PALONOSETRON HCL INJECTION 0.25 MG/5ML
INTRAVENOUS | Status: AC
  Filled 2014-09-01: qty 5

## 2014-09-01 MED ORDER — HEPARIN SOD (PORK) LOCK FLUSH 100 UNIT/ML IV SOLN
500.0000 [IU] | Freq: Once | INTRAVENOUS | Status: AC | PRN
  Administered 2014-09-01: 500 [IU]
  Filled 2014-09-01: qty 5

## 2014-09-01 MED ORDER — DEXAMETHASONE SODIUM PHOSPHATE 20 MG/5ML IJ SOLN
INTRAMUSCULAR | Status: AC
  Filled 2014-09-01: qty 5

## 2014-09-01 MED ORDER — DEXAMETHASONE SODIUM PHOSPHATE 10 MG/ML IJ SOLN
10.0000 mg | Freq: Once | INTRAMUSCULAR | Status: AC
Start: 2014-09-01 — End: 2014-09-01
  Administered 2014-09-01: 10 mg via INTRAVENOUS

## 2014-09-01 NOTE — Patient Instructions (Signed)
[facility] Discharge Instructions for Patients Receiving Chemotherapy  Today you received the following chemotherapy agents Vectibix, Irinotecan  To help prevent nausea and vomiting after your treatment, we encourage you to take your nausea medication     If you develop nausea and vomiting that is not controlled by your nausea medication, call the clinic.   BELOW ARE SYMPTOMS THAT SHOULD BE REPORTED IMMEDIATELY:  *FEVER GREATER THAN 100.5 F  *CHILLS WITH OR WITHOUT FEVER  NAUSEA AND VOMITING THAT IS NOT CONTROLLED WITH YOUR NAUSEA MEDICATION  *UNUSUAL SHORTNESS OF BREATH  *UNUSUAL BRUISING OR BLEEDING  TENDERNESS IN MOUTH AND THROAT WITH OR WITHOUT PRESENCE OF ULCERS  *URINARY PROBLEMS  *BOWEL PROBLEMS  UNUSUAL RASH Items with * indicate a potential emergency and should be followed up as soon as possible.  Feel free to call the clinic you have any questions or concerns. The clinic phone number is [phone].

## 2014-09-01 NOTE — Telephone Encounter (Signed)
gv and printed appt sched and avs for pt for SEpt adn OCT....sed added tx.

## 2014-09-01 NOTE — Progress Notes (Signed)
[facility] OFFICE PROGRESS NOTE   Diagnosis:  Colon cancer  INTERVAL HISTORY:   [name] returns as scheduled. He completed cycle 2 irinotecan/Panitumumab on 08/18/2014. He has persistent diarrhea. At the most he was having 8-10 loose stools a day. He took Lomotil and Imodium with no significant improvement. He did not take the maximum dose of either and was not aware he could take concurrently. At present he is having 2-3 loose stools a day. He noted less nausea with cycle 2. He notes the rash has increased over his face and scalp. His mouth feels "sore". He is tolerating fluids without difficulty. No significant abdominal pain. Objective:  Vital signs in last 24 hours:  Blood pressure 121/65, pulse 86, temperature 97.7 F (36.5 C), temperature source Oral, resp. rate 18, height 5' 7" (1.702 m), weight 160 lb 4.8 oz (72.712 kg), SpO2 95.00%.    HEENT: Scattered erythema over the buccal regions bilaterally. Resp: Lungs clear bilaterally. Cardio: Regular rate and rhythm. GI: Abdomen soft and nontender. No hepatomegaly. Vascular: No leg edema.  Skin: Acne type rash over the face, chest and back. Mild confluent erythema over the forearms bilaterally. Port-A-Cath site without erythema.    Lab Results:  Lab Results  Component Value Date   WBC 5.8 09/01/2014   HGB 14.2 09/01/2014   HCT 44.1 09/01/2014   MCV 90.2 09/01/2014   PLT 108* 09/01/2014   NEUTROABS 3.5 09/01/2014    Imaging:  No results found.  Medications: I have reviewed the patient's current medications.  Assessment/Plan: 1. 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  Initially diagnosed with stage III colon cancer July 2012 status post left colectomy 07/09/2011. Microsatellite stable  K-ras mutation not detected on initial testing. Extended testing returned negative for the K-ras mutation.  Adjuvant Xeloda initiated July 2012.  Rise in the CEA tumor marker and 2 intra-abdominal soft tissue  densities on CT scan November 2012 while on adjuvant Xeloda chemotherapy.  Oxaliplatin and Avastin were added to the regimen beginning 11/26/2011 with a decrease in the CEA and continued through 04/23/2012. Oxaliplatin discontinued at that time due to cumulative neurotoxicity.  Xeloda and Avastin continued.  Restaging CT evaluation 04/09/2013 showed an increase in the previously noted peritoneal implants.  FOLFIRI initiated 04/29/2013. Irinotecan dose reduced following cycle 1 due to mucositis.  Avastin added beginning with cycle 3.  Restaging CT evaluation 07/27/2013 (after 6 cycles) showed interval resolution of a previously described left lower lobe nodule. Interval improvement in peritoneal nodularity. He completed a total of 14 cycles through 11/23/2013. Treatment was adjusted to [facility] following an office visit 12/07/2013 due to a significant decline in his performance status.  Avastin placed on hold beginning 03/31/2014 due to increased urine protein. 24-hour urine on 04/02/2014 showed 814 mg of protein.  03/31/2014 CEA mildly increased at 20.3 as compared to 15.6 on 03/03/2014 and 10.4 on 02/03/2014.  Xeloda placed on hold beginning 04/14/2014 pending upcoming CT scans.  CT scan chest/abdomen/pelvis 04/29/2014 showed an enlarging stomach mass involving the anterior wall in the antral region measuring approximately 5 cm. The mass was narrowing the gastric lumen. Peritoneal implants in the left upper quadrant were noted to be slightly more enlarged.  Upper endoscopy 05/04/2014 with findings of a 5 x 5 cm mass in the gastric antrum. Biopsy showed metastatic adenocarcinoma.  Initiation of gastric radiation and concurrent Xeloda 05/25/2014; completed 06/14/2014.  Restaging CT scans chest/abdomen/pelvis 07/27/2014 with mild increase in the size of several small abdominal peritoneal metastases  and mild decrease in size of the soft tissue mass involving the anterior wall of the gastric antrum.  No new sites of metastatic disease identified. No evidence of metastatic disease involving the chest. Moderate to severe emphysema.  Cycle 1 irinotecan/panitumumab 08/04/2014 Cycle 2 irinotecan/Panitumumab 08/18/2014. 2. Status post Port-A-Cath placement 04/27/2013. 3. Status post resection of a stage I non-small cell lung cancer left upper lung with postop brachytherapy June 2007. 4. 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 Radiographic complete regression on Avastin based chemotherapy. 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 6. Type 2 diabetes. 7. History of nephrolithiasis. 8. Status post bilateral corneal transplants. 9. Increased urine protein on Avastin. 10. Intermittent upper abdominal pain secondary to gastric mass. Improved. 11. Delayed nausea and diarrhea following cycle 1 irinotecan/panitumumab-the anti-emetic premedication was adjusted with cycle 2. Nausea improved with cycle 2. Persistent diarrhea. 12. Rash secondary to Panitumumab. He continues minocycline.    Disposition: [name] has completed 2 cycles of irinotecan/Panitumumab. Plan to proceed with cycle 3 today as scheduled.   The skin rash has increased. He will continue minocycline and begin a moisturizing lotion.   He had significant diarrhea following the most recent treatment. We reviewed instructions for Imodium and Lomotil. He will contact the office if he has diarrhea despite following these instructions.  He will return for a followup visit and cycle 4 irinotecan/Panitumumab in 2 weeks.    Ned Card ANP/GNP-BC   09/01/2014  11:00 AM

## 2014-09-12 ENCOUNTER — Other Ambulatory Visit: Payer: Self-pay | Admitting: Oncology

## 2014-09-15 ENCOUNTER — Ambulatory Visit: Payer: Medicare Other

## 2014-09-15 ENCOUNTER — Telehealth: Payer: Self-pay | Admitting: Oncology

## 2014-09-15 ENCOUNTER — Ambulatory Visit: Payer: Medicare Other | Admitting: Nutrition

## 2014-09-15 ENCOUNTER — Other Ambulatory Visit: Payer: Self-pay | Admitting: *Deleted

## 2014-09-15 ENCOUNTER — Ambulatory Visit (HOSPITAL_BASED_OUTPATIENT_CLINIC_OR_DEPARTMENT_OTHER): Payer: Medicare Other | Admitting: Oncology

## 2014-09-15 ENCOUNTER — Other Ambulatory Visit (HOSPITAL_BASED_OUTPATIENT_CLINIC_OR_DEPARTMENT_OTHER): Payer: Medicare Other

## 2014-09-15 ENCOUNTER — Other Ambulatory Visit: Payer: Self-pay | Admitting: Oncology

## 2014-09-15 VITALS — BP 132/61 | HR 96 | Temp 97.6°F | Resp 20 | Ht 67.0 in | Wt 153.1 lb

## 2014-09-15 DIAGNOSIS — C799 Secondary malignant neoplasm of unspecified site: Principal | ICD-10-CM

## 2014-09-15 DIAGNOSIS — C786 Secondary malignant neoplasm of retroperitoneum and peritoneum: Secondary | ICD-10-CM

## 2014-09-15 DIAGNOSIS — C3410 Malignant neoplasm of upper lobe, unspecified bronchus or lung: Secondary | ICD-10-CM

## 2014-09-15 DIAGNOSIS — C7889 Secondary malignant neoplasm of other digestive organs: Secondary | ICD-10-CM

## 2014-09-15 DIAGNOSIS — C185 Malignant neoplasm of splenic flexure: Secondary | ICD-10-CM

## 2014-09-15 DIAGNOSIS — T451X5A Adverse effect of antineoplastic and immunosuppressive drugs, initial encounter: Secondary | ICD-10-CM

## 2014-09-15 DIAGNOSIS — R112 Nausea with vomiting, unspecified: Secondary | ICD-10-CM

## 2014-09-15 DIAGNOSIS — G622 Polyneuropathy due to other toxic agents: Secondary | ICD-10-CM

## 2014-09-15 DIAGNOSIS — C189 Malignant neoplasm of colon, unspecified: Secondary | ICD-10-CM

## 2014-09-15 DIAGNOSIS — E119 Type 2 diabetes mellitus without complications: Secondary | ICD-10-CM

## 2014-09-15 DIAGNOSIS — R197 Diarrhea, unspecified: Secondary | ICD-10-CM

## 2014-09-15 DIAGNOSIS — R21 Rash and other nonspecific skin eruption: Secondary | ICD-10-CM

## 2014-09-15 LAB — CBC WITH DIFFERENTIAL/PLATELET
BASO%: 0.7 % (ref 0.0–2.0)
Basophils Absolute: 0.1 10*3/uL (ref 0.0–0.1)
EOS ABS: 0.5 10*3/uL (ref 0.0–0.5)
EOS%: 5 % (ref 0.0–7.0)
HCT: 46 % (ref 38.4–49.9)
HGB: 14.9 g/dL (ref 13.0–17.1)
LYMPH#: 0.5 10*3/uL — AB (ref 0.9–3.3)
LYMPH%: 5.7 % — ABNORMAL LOW (ref 14.0–49.0)
MCH: 29.2 pg (ref 27.2–33.4)
MCHC: 32.4 g/dL (ref 32.0–36.0)
MCV: 90.1 fL (ref 79.3–98.0)
MONO#: 0.9 10*3/uL (ref 0.1–0.9)
MONO%: 9.7 % (ref 0.0–14.0)
NEUT%: 78.9 % — ABNORMAL HIGH (ref 39.0–75.0)
NEUTROS ABS: 7.6 10*3/uL — AB (ref 1.5–6.5)
Platelets: 173 10*3/uL (ref 140–400)
RBC: 5.11 10*6/uL (ref 4.20–5.82)
RDW: 16.9 % — AB (ref 11.0–14.6)
WBC: 9.6 10*3/uL (ref 4.0–10.3)

## 2014-09-15 LAB — CEA: CEA: 41.7 ng/mL — ABNORMAL HIGH (ref 0.0–5.0)

## 2014-09-15 LAB — COMPREHENSIVE METABOLIC PANEL (CC13)
ALK PHOS: 88 U/L (ref 40–150)
ALT: 14 U/L (ref 0–55)
AST: 17 U/L (ref 5–34)
Albumin: 2.9 g/dL — ABNORMAL LOW (ref 3.5–5.0)
Anion Gap: 7 mEq/L (ref 3–11)
BUN: 8.8 mg/dL (ref 7.0–26.0)
CO2: 31 mEq/L — ABNORMAL HIGH (ref 22–29)
Calcium: 10.5 mg/dL — ABNORMAL HIGH (ref 8.4–10.4)
Chloride: 105 mEq/L (ref 98–109)
Creatinine: 1.1 mg/dL (ref 0.7–1.3)
GLUCOSE: 129 mg/dL (ref 70–140)
POTASSIUM: 4.1 meq/L (ref 3.5–5.1)
SODIUM: 143 meq/L (ref 136–145)
TOTAL PROTEIN: 5.9 g/dL — AB (ref 6.4–8.3)
Total Bilirubin: 0.51 mg/dL (ref 0.20–1.20)

## 2014-09-15 LAB — MAGNESIUM (CC13): Magnesium: 1.8 mg/dl (ref 1.5–2.5)

## 2014-09-15 MED ORDER — OPIUM 10 MG/ML (1%) PO TINC: 0.5000 mL | Freq: Four times a day (QID) | ORAL | Status: DC | PRN

## 2014-09-15 NOTE — Progress Notes (Signed)
[place] OFFICE PROGRESS NOTE   Diagnosis: Colon cancer  INTERVAL HISTORY:   [name] returns as scheduled. He completed a third cycle of irinotecan/panitumumab on 09/01/2014. No nausea/vomiting or mouth sores following chemotherapy. He reports dryness at the lips and a stable rash over the scalp and trunk.  [name] developed diarrhea beginning 3-4 days following chemotherapy and this has persisted. He had diarrhea 4-6 times per day that was not improved with Imodium or Lomotil. No diarrhea yet today. He is losing weight.  Objective:  Vital signs in last 24 hours:  Blood pressure 132/61, pulse 96, temperature 97.6 F (36.4 C), temperature source Oral, resp. rate 20, height 5' 7"  (1.702 m), weight 153 lb 1.6 oz (69.446 kg).    HEENT: No thrush or ulcers, mild erythema and dryness of the lips Resp: Lungs clear bilaterally Cardio: Regular rate and rhythm GI: No hepatomegaly, nontender, no mass Vascular: No leg edema  Skin: Acne type rash over the scalp, back, and chest. Minimal rash over the face   Portacath/PICC-without erythema  Lab Results:  Lab Results  Component Value Date   WBC 9.6 09/15/2014   HGB 14.9 09/15/2014   HCT 46.0 09/15/2014   MCV 90.1 09/15/2014   PLT 173 09/15/2014   NEUTROABS 7.6* 09/15/2014     Lab Results  Component Value Date   CEA 48.0* 07/27/2014     Medications: I have reviewed the patient's current medications.  Assessment/Plan: 1. 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  Initially diagnosed with stage III colon cancer July 2012 status post left colectomy 07/09/2011. Microsatellite stable  K-ras mutation not detected on initial testing. Extended testing returned negative for the K-ras mutation.  Adjuvant Xeloda initiated July 2012.  Rise in the CEA tumor marker and 2 intra-abdominal soft tissue densities on CT scan November 2012 while on adjuvant Xeloda chemotherapy.  Oxaliplatin and Avastin were added to the  regimen beginning 11/26/2011 with a decrease in the CEA and continued through 04/23/2012. Oxaliplatin discontinued at that time due to cumulative neurotoxicity.  Xeloda and Avastin continued.  Restaging CT evaluation 04/09/2013 showed an increase in the previously noted peritoneal implants.  FOLFIRI initiated 04/29/2013. Irinotecan dose reduced following cycle 1 due to mucositis.  Avastin added beginning with cycle 3.  Restaging CT evaluation 07/27/2013 (after 6 cycles) showed interval resolution of a previously described left lower lobe nodule. Interval improvement in peritoneal nodularity. He completed a total of 14 cycles through 11/23/2013. Treatment was adjusted to [facility] following an office visit 12/07/2013 due to a significant decline in his performance status.  Avastin placed on hold beginning 03/31/2014 due to increased urine protein. 24-hour urine on 04/02/2014 showed 814 mg of protein.  03/31/2014 CEA mildly increased at 20.3 as compared to 15.6 on 03/03/2014 and 10.4 on 02/03/2014.  Xeloda placed on hold beginning 04/14/2014 pending upcoming CT scans.  CT scan chest/abdomen/pelvis 04/29/2014 showed an enlarging stomach mass involving the anterior wall in the antral region measuring approximately 5 cm. The mass was narrowing the gastric lumen. Peritoneal implants in the left upper quadrant were noted to be slightly more enlarged.  Upper endoscopy 05/04/2014 with findings of a 5 x 5 cm mass in the gastric antrum. Biopsy showed metastatic adenocarcinoma.  Initiation of gastric radiation and concurrent Xeloda 05/25/2014; completed 06/14/2014.  Restaging CT scans chest/abdomen/pelvis 07/27/2014 with mild increase in the size of several small abdominal peritoneal metastases and mild decrease in size of the soft tissue mass involving the anterior wall of  the gastric antrum. No new sites of metastatic disease identified. No evidence of metastatic disease involving the chest. Moderate to  severe emphysema.  Cycle 1 irinotecan/panitumumab 08/04/2014  Cycle 2 irinotecan/Panitumumab 08/18/2014. Cycle 3 irinotecan/panitumumab 09/01/2014 2. Status post Port-A-Cath placement 04/27/2013. 3. Status post resection of a stage I non-small cell lung cancer left upper lung with postop brachytherapy June 2007. 4. 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 Radiographic complete regression on Avastin based chemotherapy. 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 6. Type 2 diabetes. 7. History of nephrolithiasis. 8. Status post bilateral corneal transplants. 9. Increased urine protein on Avastin. 10. Intermittent upper abdominal pain secondary to gastric mass. Improved. 11. Delayed nausea and diarrhea following cycle 1 irinotecan/panitumumab-the anti-emetic premedication was adjusted with cycle 2. Nausea improved with cycle 2. Persistent diarrhea. 12. Rash secondary to Panitumumab. He continues minocycline.    Disposition:  He has persistent diarrhea following chemotherapy. This is most likely related to irinotecan. Imodium and Lomotil have not helped. He is losing weight. We decided to postpone the next cycle of chemotherapy until 09/22/2014. The irinotecan will be further dose reduced. He will try tincture of opium for the diarrhea.  We decided to continue minocycline.  Mr. Cherubini will return for an office visit 09/22/2014. He will meet with the cancer Center nutritionist today.  Betsy Coder, MD  09/15/2014  9:51 AM

## 2014-09-15 NOTE — Telephone Encounter (Signed)
gv adn printed appt sched and avs for pt for Sept and OCT...sed added tx.

## 2014-09-15 NOTE — Progress Notes (Signed)
Received a request from M.D. to add patient to schedule today. Patient is a 72 year old man with metastatic colon cancer.  He is a patient of Dr. Benay Spice.  Past medical history includes diabetes, COPD, diverticulosis, hyperlipidemia, and anemia.  Medications include vitamin D 3, Lomotil, Imodium, Ativan, metformin, omega-3 fatty acids, Zofran, Protonix, and Compazine.  Labs include albumin 2.9 on September 23.  Height: 67 inches. Weight: 153.1 pounds 9/23. Usual body weight: 175 pounds May 2015. BMI: 23.97.  Patient has had persistent diarrhea.  He has been using Lomotil, and Imodium, with very little improvement.  Patient has decreased appetite.  He denies nausea or vomiting.   This patient has experienced 13% weight loss from usual body weight over approximately 4 months.  Patient meets criteria for severe malnutrition in the context of chronic illness secondary to greater than 7% weight loss over 3 months and less than 75% of energy intake for greater than one month.  Nutrition diagnosis: Food and nutrition related knowledge deficit related to diarrhea as evidenced by no prior need for nutrition related information.  Intervention: Educated patient on MNT strategies for improving diarrhea.  Provided fact sheets. Encouraged patient to consume smaller, more frequent meals and snacks.  Recommended increased calories to minimize further weight loss. Questions were answered.  Teach back method used.  Monitoring, evaluation, goals: Patient will tolerate bland diet for improvement in diarrhea, and increased oral intake.  Next visit: Patient will contact me for questions.  He has my contact information.   **Disclaimer: This note was dictated with voice recognition software. Similar sounding words can inadvertently be transcribed and this note may contain transcription errors which may not have been corrected upon publication of note.**

## 2014-09-22 ENCOUNTER — Ambulatory Visit (HOSPITAL_BASED_OUTPATIENT_CLINIC_OR_DEPARTMENT_OTHER): Payer: Medicare Other | Admitting: Nurse Practitioner

## 2014-09-22 ENCOUNTER — Ambulatory Visit (HOSPITAL_BASED_OUTPATIENT_CLINIC_OR_DEPARTMENT_OTHER): Payer: Medicare Other

## 2014-09-22 ENCOUNTER — Other Ambulatory Visit (HOSPITAL_BASED_OUTPATIENT_CLINIC_OR_DEPARTMENT_OTHER): Payer: Medicare Other

## 2014-09-22 ENCOUNTER — Telehealth: Payer: Self-pay | Admitting: Oncology

## 2014-09-22 VITALS — BP 119/71 | HR 87 | Temp 97.9°F | Resp 20 | Ht 67.0 in | Wt 158.1 lb

## 2014-09-22 DIAGNOSIS — Z5111 Encounter for antineoplastic chemotherapy: Secondary | ICD-10-CM

## 2014-09-22 DIAGNOSIS — Z85118 Personal history of other malignant neoplasm of bronchus and lung: Secondary | ICD-10-CM

## 2014-09-22 DIAGNOSIS — G620 Drug-induced polyneuropathy: Secondary | ICD-10-CM

## 2014-09-22 DIAGNOSIS — C185 Malignant neoplasm of splenic flexure: Secondary | ICD-10-CM

## 2014-09-22 DIAGNOSIS — C189 Malignant neoplasm of colon, unspecified: Secondary | ICD-10-CM

## 2014-09-22 DIAGNOSIS — R21 Rash and other nonspecific skin eruption: Secondary | ICD-10-CM

## 2014-09-22 DIAGNOSIS — C799 Secondary malignant neoplasm of unspecified site: Principal | ICD-10-CM

## 2014-09-22 DIAGNOSIS — C786 Secondary malignant neoplasm of retroperitoneum and peritoneum: Secondary | ICD-10-CM

## 2014-09-22 DIAGNOSIS — Z5112 Encounter for antineoplastic immunotherapy: Secondary | ICD-10-CM

## 2014-09-22 LAB — CBC WITH DIFFERENTIAL/PLATELET
BASO%: 0.3 % (ref 0.0–2.0)
Basophils Absolute: 0 10*3/uL (ref 0.0–0.1)
EOS%: 7.4 % — ABNORMAL HIGH (ref 0.0–7.0)
Eosinophils Absolute: 0.6 10*3/uL — ABNORMAL HIGH (ref 0.0–0.5)
HEMATOCRIT: 44 % (ref 38.4–49.9)
HEMOGLOBIN: 14.3 g/dL (ref 13.0–17.1)
LYMPH%: 6.7 % — AB (ref 14.0–49.0)
MCH: 29.6 pg (ref 27.2–33.4)
MCHC: 32.5 g/dL (ref 32.0–36.0)
MCV: 91.1 fL (ref 79.3–98.0)
MONO#: 1.2 10*3/uL — ABNORMAL HIGH (ref 0.1–0.9)
MONO%: 13.3 % (ref 0.0–14.0)
NEUT#: 6.3 10*3/uL (ref 1.5–6.5)
NEUT%: 72.3 % (ref 39.0–75.0)
Platelets: 117 10*3/uL — ABNORMAL LOW (ref 140–400)
RBC: 4.83 10*6/uL (ref 4.20–5.82)
RDW: 17.1 % — ABNORMAL HIGH (ref 11.0–14.6)
WBC: 8.7 10*3/uL (ref 4.0–10.3)
lymph#: 0.6 10*3/uL — ABNORMAL LOW (ref 0.9–3.3)

## 2014-09-22 MED ORDER — SODIUM CHLORIDE 0.9 % IJ SOLN
10.0000 mL | INTRAMUSCULAR | Status: DC | PRN
  Administered 2014-09-22: 10 mL
  Filled 2014-09-22: qty 10

## 2014-09-22 MED ORDER — SODIUM CHLORIDE 0.9 % IV SOLN
Freq: Once | INTRAVENOUS | Status: AC
  Administered 2014-09-22: 13:00:00 via INTRAVENOUS

## 2014-09-22 MED ORDER — ATROPINE SULFATE 1 MG/ML IJ SOLN
INTRAMUSCULAR | Status: AC
  Filled 2014-09-22: qty 1

## 2014-09-22 MED ORDER — HEPARIN SOD (PORK) LOCK FLUSH 100 UNIT/ML IV SOLN
500.0000 [IU] | Freq: Once | INTRAVENOUS | Status: AC | PRN
  Administered 2014-09-22: 500 [IU]
  Filled 2014-09-22: qty 5

## 2014-09-22 MED ORDER — DEXAMETHASONE SODIUM PHOSPHATE 20 MG/5ML IJ SOLN
INTRAMUSCULAR | Status: AC
  Filled 2014-09-22: qty 5

## 2014-09-22 MED ORDER — IRINOTECAN HCL CHEMO INJECTION 100 MG/5ML
90.0000 mg/m2 | Freq: Once | INTRAVENOUS | Status: AC
  Administered 2014-09-22: 160 mg via INTRAVENOUS
  Filled 2014-09-22: qty 8

## 2014-09-22 MED ORDER — SODIUM CHLORIDE 0.9 % IV SOLN
6.0000 mg/kg | Freq: Once | INTRAVENOUS | Status: AC
  Administered 2014-09-22: 460 mg via INTRAVENOUS
  Filled 2014-09-22: qty 23

## 2014-09-22 MED ORDER — ATROPINE SULFATE 1 MG/ML IJ SOLN
0.5000 mg | Freq: Once | INTRAMUSCULAR | Status: AC | PRN
  Administered 2014-09-22: 16:00:00 via INTRAVENOUS

## 2014-09-22 MED ORDER — PALONOSETRON HCL INJECTION 0.25 MG/5ML
INTRAVENOUS | Status: AC
  Filled 2014-09-22: qty 5

## 2014-09-22 MED ORDER — SODIUM CHLORIDE 0.9 % IV SOLN
150.0000 mg | Freq: Once | INTRAVENOUS | Status: AC
  Administered 2014-09-22: 150 mg via INTRAVENOUS
  Filled 2014-09-22: qty 5

## 2014-09-22 MED ORDER — DEXAMETHASONE SODIUM PHOSPHATE 10 MG/ML IJ SOLN
10.0000 mg | Freq: Once | INTRAMUSCULAR | Status: AC
  Administered 2014-09-22: 10 mg via INTRAVENOUS

## 2014-09-22 MED ORDER — PALONOSETRON HCL INJECTION 0.25 MG/5ML
0.2500 mg | Freq: Once | INTRAVENOUS | Status: AC
  Administered 2014-09-22: 0.25 mg via INTRAVENOUS

## 2014-09-22 NOTE — Patient Instructions (Signed)
[place] Discharge Instructions for Patients Receiving Chemotherapy  Today you received the following chemotherapy agents Iriontecan and Panitumumab.  To help prevent nausea and vomiting after your treatment, we encourage you to take your nausea medication as directed.   If you develop nausea and vomiting that is not controlled by your nausea medication, call the clinic.   BELOW ARE SYMPTOMS THAT SHOULD BE REPORTED IMMEDIATELY:  *FEVER GREATER THAN 100.5 F  *CHILLS WITH OR WITHOUT FEVER  NAUSEA AND VOMITING THAT IS NOT CONTROLLED WITH YOUR NAUSEA MEDICATION  *UNUSUAL SHORTNESS OF BREATH  *UNUSUAL BRUISING OR BLEEDING  TENDERNESS IN MOUTH AND THROAT WITH OR WITHOUT PRESENCE OF ULCERS  *URINARY PROBLEMS  *BOWEL PROBLEMS  UNUSUAL RASH Items with * indicate a potential emergency and should be followed up as soon as possible.  Feel free to call the clinic you have any questions or concerns. The clinic phone number is [phone].

## 2014-09-22 NOTE — Telephone Encounter (Signed)
gv adn printed appt sched and avs for pt for OCT and NOV....sed added tx.

## 2014-09-22 NOTE — Progress Notes (Addendum)
[place] OFFICE PROGRESS NOTE   Diagnosis:  Colon cancer  INTERVAL HISTORY:   [name] returns as scheduled. He completed cycle 3 irinotecan/Panitumumab on 09/01/2014. Treatment was held on 09/15/2014 due to diarrhea and weight loss. Tincture of opium was prescribed.  He was unable to obtain the tincture of opium due to the cost. The diarrhea has "subsided". He had 2 bowel movements yesterday. Stools are intermittently loose. He denies nausea/vomiting. He has a persistent rash which he feels is unchanged. He has intermittent upper abdominal discomfort which is eased with eating and following a bowel movement. Appetite is better. He has gained some weight over the past week.  Objective:  Vital signs in last 24 hours:  Blood pressure 119/71, pulse 87, temperature 97.9 F (36.6 C), temperature source Oral, resp. rate 20, height _0  (1.702 m), weight 158 lb 1.6 oz (71.714 kg).    HEENT: No thrush or ulcers. Scattered patches of erythema over the buccal regions. Resp: Lungs clear bilaterally. Cardio: Regular rate and rhythm. GI: Abdomen soft and nontender. No hepatomegaly. No mass. Vascular: No leg edema.  Skin: Acne type rash over the scalp, back and chest. The rash has a crusted appearance over the scalp. Port-A-Cath without erythema   Lab Results:  Lab Results  Component Value Date   WBC 8.7 09/22/2014   HGB 14.3 09/22/2014   HCT 44.0 09/22/2014   MCV 91.1 09/22/2014   PLT 117* 09/22/2014   NEUTROABS 6.3 09/22/2014    Imaging:  No results found.  Medications: I have reviewed the patient's current medications.  Assessment/Plan: 1. 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  Initially diagnosed with stage III colon cancer July 2012 status post left colectomy 07/09/2011. Microsatellite stable  K-ras mutation not detected on initial testing. Extended testing returned negative for the K-ras mutation.  Adjuvant Xeloda initiated July 2012.  Rise in  the CEA tumor marker and 2 intra-abdominal soft tissue densities on CT scan November 2012 while on adjuvant Xeloda chemotherapy.  Oxaliplatin and Avastin were added to the regimen beginning 11/26/2011 with a decrease in the CEA and continued through 04/23/2012. Oxaliplatin discontinued at that time due to cumulative neurotoxicity.  Xeloda and Avastin continued.  Restaging CT evaluation 04/09/2013 showed an increase in the previously noted peritoneal implants.  FOLFIRI initiated 04/29/2013. Irinotecan dose reduced following cycle 1 due to mucositis.  Avastin added beginning with cycle 3.  Restaging CT evaluation 07/27/2013 (after 6 cycles) showed interval resolution of a previously described left lower lobe nodule. Interval improvement in peritoneal nodularity. He completed a total of 14 cycles through 11/23/2013. Treatment was adjusted to [facility] following an office visit 12/07/2013 due to a significant decline in his performance status.  Avastin placed on hold beginning 03/31/2014 due to increased urine protein. 24-hour urine on 04/02/2014 showed 814 mg of protein.  03/31/2014 CEA mildly increased at 20.3 as compared to 15.6 on 03/03/2014 and 10.4 on 02/03/2014.  Xeloda placed on hold beginning 04/14/2014 pending upcoming CT scans.  CT scan chest/abdomen/pelvis 04/29/2014 showed an enlarging stomach mass involving the anterior wall in the antral region measuring approximately 5 cm. The mass was narrowing the gastric lumen. Peritoneal implants in the left upper quadrant were noted to be slightly more enlarged.  Upper endoscopy 05/04/2014 with findings of a 5 x 5 cm mass in the gastric antrum. Biopsy showed metastatic adenocarcinoma.  Initiation of gastric radiation and concurrent Xeloda 05/25/2014; completed 06/14/2014.  Restaging CT scans chest/abdomen/pelvis 07/27/2014 with mild increase in  the size of several small abdominal peritoneal metastases and mild decrease in size of the soft tissue  mass involving the anterior wall of the gastric antrum. No new sites of metastatic disease identified. No evidence of metastatic disease involving the chest. Moderate to severe emphysema.  Cycle 1 irinotecan/panitumumab 08/04/2014  Cycle 2 irinotecan/Panitumumab 08/18/2014.  Cycle 3 irinotecan/panitumumab 09/01/2014. CEA improved 09/15/2014. Treatment held 09/15/2014 due to diarrhea. Cycle 4 irinotecan/Panitumumab 09/22/2014 (irinotecan dose reduced due to diarrhea). 2. Status post Port-A-Cath placement 04/27/2013. 3. Status post resection of a stage I non-small cell lung cancer left upper lung with postop brachytherapy June 2007. 4. 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 Radiographic complete regression on Avastin based chemotherapy. 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 6. Type 2 diabetes. 7. History of nephrolithiasis. 8. Status post bilateral corneal transplants. 9. Increased urine protein on Avastin. 10. Intermittent upper abdominal pain secondary to gastric mass. Improved. 11. Delayed nausea and diarrhea following cycle 1 irinotecan/panitumumab-the anti-emetic premedication was adjusted with cycle 2. Nausea improved with cycle 2. Persistent diarrhea. 12. Rash secondary to Panitumumab. He continues minocycline.  13. Weight loss. Improved. 14. Diarrhea most likely related to irinotecan. Irinotecan dose reduced beginning with cycle 4 09/22/2014.   Disposition: [name] appears improved. The diarrhea is significantly better. Plan to proceed with cycle 4 irinotecan/Panitumumab today as scheduled. The irinotecan will be dose reduced due to diarrhea following cycle 3. He understands to contact the office with recurrent diarrhea.  He will return for a followup visit and cycle 5 irinotecan/Panitumumab in 2 weeks. The plan is for a  restaging CT evaluation following 6 cycles.  Patient seen with Dr. Benay Spice.    Ned Card ANP/GNP-BC   09/22/2014  12:27 PM  This was a shared visit with Ned Card. The diarrhea has improved. The plan is to proceed with dose reduced irinotecan today.  We will attempt to get insurance approval for tincture of opium.  Julieanne Manson, M.D.

## 2014-09-24 ENCOUNTER — Telehealth: Payer: Self-pay | Admitting: *Deleted

## 2014-09-24 NOTE — Telephone Encounter (Signed)
Returned patient's phone call regarding opium tinc prescription. Patient stated that he would like to have prescription on file in case he started having diarrhea.    Patient stated that he has not had any diarrhea recently.  Informed patient that pharmacy is closed over the weekend and it takes a day to fill prescription.  Patient verbalized understanding and stated that he would just wait until Monday to see if he has any diarrhea.

## 2014-09-24 NOTE — Telephone Encounter (Signed)
Left VM asking to speak with Ned Card, NP in regards to the opium script they discussed at last visit.

## 2014-09-27 ENCOUNTER — Telehealth: Payer: Self-pay

## 2014-09-27 NOTE — Telephone Encounter (Signed)
Called and spoke with [name], to see if patient needed the rx for Opium for his diarrhea. [name] informed me that [name] was not available at this time that she will have him call back.

## 2014-10-06 ENCOUNTER — Ambulatory Visit (HOSPITAL_BASED_OUTPATIENT_CLINIC_OR_DEPARTMENT_OTHER): Payer: Medicare Other

## 2014-10-06 ENCOUNTER — Other Ambulatory Visit (HOSPITAL_BASED_OUTPATIENT_CLINIC_OR_DEPARTMENT_OTHER): Payer: Medicare Other

## 2014-10-06 ENCOUNTER — Other Ambulatory Visit: Payer: Self-pay | Admitting: *Deleted

## 2014-10-06 ENCOUNTER — Ambulatory Visit (HOSPITAL_BASED_OUTPATIENT_CLINIC_OR_DEPARTMENT_OTHER): Payer: Medicare Other | Admitting: Oncology

## 2014-10-06 ENCOUNTER — Other Ambulatory Visit: Payer: Self-pay | Admitting: Oncology

## 2014-10-06 VITALS — BP 121/68 | HR 85 | Temp 98.0°F | Resp 20

## 2014-10-06 VITALS — BP 122/70 | HR 70 | Temp 97.7°F | Resp 18 | Ht 67.0 in | Wt 153.8 lb

## 2014-10-06 DIAGNOSIS — C189 Malignant neoplasm of colon, unspecified: Secondary | ICD-10-CM

## 2014-10-06 DIAGNOSIS — C786 Secondary malignant neoplasm of retroperitoneum and peritoneum: Secondary | ICD-10-CM

## 2014-10-06 DIAGNOSIS — C185 Malignant neoplasm of splenic flexure: Secondary | ICD-10-CM

## 2014-10-06 DIAGNOSIS — R21 Rash and other nonspecific skin eruption: Secondary | ICD-10-CM

## 2014-10-06 DIAGNOSIS — R1010 Upper abdominal pain, unspecified: Secondary | ICD-10-CM

## 2014-10-06 DIAGNOSIS — Z5111 Encounter for antineoplastic chemotherapy: Secondary | ICD-10-CM

## 2014-10-06 DIAGNOSIS — C799 Secondary malignant neoplasm of unspecified site: Secondary | ICD-10-CM

## 2014-10-06 DIAGNOSIS — R634 Abnormal weight loss: Secondary | ICD-10-CM

## 2014-10-06 DIAGNOSIS — Z5112 Encounter for antineoplastic immunotherapy: Secondary | ICD-10-CM

## 2014-10-06 LAB — COMPREHENSIVE METABOLIC PANEL (CC13)
ALK PHOS: 85 U/L (ref 40–150)
ALT: 22 U/L (ref 0–55)
AST: 16 U/L (ref 5–34)
Albumin: 3 g/dL — ABNORMAL LOW (ref 3.5–5.0)
Anion Gap: 9 mEq/L (ref 3–11)
BUN: 12 mg/dL (ref 7.0–26.0)
CHLORIDE: 105 meq/L (ref 98–109)
CO2: 29 mEq/L (ref 22–29)
Calcium: 10.6 mg/dL — ABNORMAL HIGH (ref 8.4–10.4)
Creatinine: 1.1 mg/dL (ref 0.7–1.3)
Glucose: 128 mg/dl (ref 70–140)
POTASSIUM: 4.1 meq/L (ref 3.5–5.1)
SODIUM: 143 meq/L (ref 136–145)
Total Bilirubin: 0.52 mg/dL (ref 0.20–1.20)
Total Protein: 6.3 g/dL — ABNORMAL LOW (ref 6.4–8.3)

## 2014-10-06 LAB — CBC WITH DIFFERENTIAL/PLATELET
BASO%: 0.2 % (ref 0.0–2.0)
Basophils Absolute: 0 10*3/uL (ref 0.0–0.1)
EOS%: 4.8 % (ref 0.0–7.0)
Eosinophils Absolute: 0.5 10*3/uL (ref 0.0–0.5)
HCT: 47.8 % (ref 38.4–49.9)
HEMOGLOBIN: 15.6 g/dL (ref 13.0–17.1)
LYMPH%: 4.2 % — ABNORMAL LOW (ref 14.0–49.0)
MCH: 29.8 pg (ref 27.2–33.4)
MCHC: 32.6 g/dL (ref 32.0–36.0)
MCV: 91.2 fL (ref 79.3–98.0)
MONO#: 0.9 10*3/uL (ref 0.1–0.9)
MONO%: 8.1 % (ref 0.0–14.0)
NEUT#: 9.2 10*3/uL — ABNORMAL HIGH (ref 1.5–6.5)
NEUT%: 82.7 % — ABNORMAL HIGH (ref 39.0–75.0)
Platelets: 162 10*3/uL (ref 140–400)
RBC: 5.24 10*6/uL (ref 4.20–5.82)
RDW: 16.1 % — AB (ref 11.0–14.6)
WBC: 11.2 10*3/uL — ABNORMAL HIGH (ref 4.0–10.3)
lymph#: 0.5 10*3/uL — ABNORMAL LOW (ref 0.9–3.3)

## 2014-10-06 LAB — MAGNESIUM (CC13): Magnesium: 1.8 mg/dl (ref 1.5–2.5)

## 2014-10-06 MED ORDER — PALONOSETRON HCL INJECTION 0.25 MG/5ML
INTRAVENOUS | Status: AC
  Filled 2014-10-06: qty 5

## 2014-10-06 MED ORDER — HEPARIN SOD (PORK) LOCK FLUSH 100 UNIT/ML IV SOLN
500.0000 [IU] | Freq: Once | INTRAVENOUS | Status: AC | PRN
  Administered 2014-10-06: 500 [IU]
  Filled 2014-10-06: qty 5

## 2014-10-06 MED ORDER — PALONOSETRON HCL INJECTION 0.25 MG/5ML
0.2500 mg | Freq: Once | INTRAVENOUS | Status: AC
  Administered 2014-10-06: 0.25 mg via INTRAVENOUS

## 2014-10-06 MED ORDER — HYDROCODONE-ACETAMINOPHEN 5-325 MG PO TABS: 1.0000 | ORAL_TABLET | ORAL | Status: DC | PRN

## 2014-10-06 MED ORDER — DEXAMETHASONE SODIUM PHOSPHATE 10 MG/ML IJ SOLN
INTRAMUSCULAR | Status: AC
  Filled 2014-10-06: qty 1

## 2014-10-06 MED ORDER — SODIUM CHLORIDE 0.9 % IV SOLN
6.0000 mg/kg | Freq: Once | INTRAVENOUS | Status: AC
  Administered 2014-10-06: 460 mg via INTRAVENOUS
  Filled 2014-10-06: qty 23

## 2014-10-06 MED ORDER — ATROPINE SULFATE 1 MG/ML IJ SOLN
0.5000 mg | Freq: Once | INTRAMUSCULAR | Status: AC | PRN
  Administered 2014-10-06: 0.5 mg via INTRAVENOUS

## 2014-10-06 MED ORDER — SODIUM CHLORIDE 0.9 % IJ SOLN
10.0000 mL | INTRAMUSCULAR | Status: DC | PRN
  Administered 2014-10-06: 10 mL
  Filled 2014-10-06: qty 10

## 2014-10-06 MED ORDER — IRINOTECAN HCL CHEMO INJECTION 100 MG/5ML
90.0000 mg/m2 | Freq: Once | INTRAVENOUS | Status: AC
  Administered 2014-10-06: 160 mg via INTRAVENOUS
  Filled 2014-10-06: qty 8

## 2014-10-06 MED ORDER — DEXAMETHASONE SODIUM PHOSPHATE 10 MG/ML IJ SOLN
10.0000 mg | Freq: Once | INTRAMUSCULAR | Status: AC
  Administered 2014-10-06: 10 mg via INTRAVENOUS

## 2014-10-06 MED ORDER — DEXAMETHASONE SODIUM PHOSPHATE 20 MG/5ML IJ SOLN
INTRAMUSCULAR | Status: AC
  Filled 2014-10-06: qty 5

## 2014-10-06 MED ORDER — MINOCYCLINE HCL 100 MG PO CAPS: 100.0000 mg | ORAL_CAPSULE | Freq: Two times a day (BID) | ORAL | Status: DC

## 2014-10-06 MED ORDER — FOSAPREPITANT DIMEGLUMINE INJECTION 150 MG
150.0000 mg | Freq: Once | INTRAVENOUS | Status: AC
  Administered 2014-10-06: 150 mg via INTRAVENOUS
  Filled 2014-10-06: qty 5

## 2014-10-06 MED ORDER — ATROPINE SULFATE 1 MG/ML IJ SOLN
INTRAMUSCULAR | Status: AC
  Filled 2014-10-06: qty 1

## 2014-10-06 MED ORDER — SODIUM CHLORIDE 0.9 % IV SOLN
Freq: Once | INTRAVENOUS | Status: AC
  Administered 2014-10-06: 11:00:00 via INTRAVENOUS

## 2014-10-06 NOTE — Patient Instructions (Addendum)
[facility] Discharge Instructions for Patients Receiving [facility] Discharge Instructions for Patients Receiving Chemotherapy  Today you received the following chemotherapy agents Iriontecan and Panitumumab.  To help prevent nausea and vomiting after your treatment, we encourage you to take your nausea medication as directed.   If you develop nausea and vomiting that is not controlled by your nausea medication, call the clinic.   BELOW ARE SYMPTOMS THAT SHOULD BE REPORTED IMMEDIATELY:  *FEVER GREATER THAN 100.5 F  *CHILLS WITH OR WITHOUT FEVER  NAUSEA AND VOMITING THAT IS NOT CONTROLLED WITH YOUR NAUSEA MEDICATION  *UNUSUAL SHORTNESS OF BREATH  *UNUSUAL BRUISING OR BLEEDING  TENDERNESS IN MOUTH AND THROAT WITH OR WITHOUT PRESENCE OF ULCERS  *URINARY PROBLEMS  *BOWEL PROBLEMS  UNUSUAL RASH Items with * indicate a potential emergency and should be followed up as soon as possible.  Feel free to call the clinic you have any questions or concerns. The clinic phone number is [phone].

## 2014-10-06 NOTE — Progress Notes (Signed)
[facility] OFFICE PROGRESS NOTE   Diagnosis: Colon cancer  INTERVAL HISTORY:   [name] returns as scheduled. He completed another cycle of irinotecan/panitumumab on 09/22/2014. He reports intermittent nausea, no vomiting. The diarrhea has improved and is now controlled with Imodium. He reports a poor appetite. He has intermittent upper abdominal pain. Persistent rash over the trunk. The rash is not pruritic.  Objective:  Vital signs in last 24 hours:  Blood pressure 122/70, pulse 70, temperature 97.7 F (36.5 C), temperature source Oral, resp. rate 18, height 5' 7"  (1.702 m), weight 153 lb 12.8 oz (69.763 kg), SpO2 95.00%.    HEENT: No thrush, 3 mm ulcer at the tip of the tongue Resp: Lungs clear bilaterally Cardio: Regular rate and rhythm GI: No hepatomegaly, nontender Vascular: No leg edema  Skin: Acne type rash over the chest and back.   Portacath/PICC-without erythema  Lab Results:  Lab Results  Component Value Date   WBC 11.2* 10/06/2014   HGB 15.6 10/06/2014   HCT 47.8 10/06/2014   MCV 91.2 10/06/2014   PLT 162 10/06/2014   NEUTROABS 9.2* 10/06/2014      Lab Results  Component Value Date   CEA 41.7* 09/15/2014    Imaging:  No results found.  Medications: I have reviewed the patient's current medications.  Assessment/Plan: 1. 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  Initially diagnosed with stage III colon cancer July 2012 status post left colectomy 07/09/2011. Microsatellite stable  K-ras mutation not detected on initial testing. Extended testing returned negative for the K-ras mutation.  Adjuvant Xeloda initiated July 2012.  Rise in the CEA tumor marker and 2 intra-abdominal soft tissue densities on CT scan November 2012 while on adjuvant Xeloda chemotherapy.  Oxaliplatin and Avastin were added to the regimen beginning 11/26/2011 with a decrease in the CEA and continued through 04/23/2012. Oxaliplatin discontinued at that  time due to cumulative neurotoxicity.  Xeloda and Avastin continued.  Restaging CT evaluation 04/09/2013 showed an increase in the previously noted peritoneal implants.  FOLFIRI initiated 04/29/2013. Irinotecan dose reduced following cycle 1 due to mucositis.  Avastin added beginning with cycle 3.  Restaging CT evaluation 07/27/2013 (after 6 cycles) showed interval resolution of a previously described left lower lobe nodule. Interval improvement in peritoneal nodularity. He completed a total of 14 cycles through 11/23/2013. Treatment was adjusted to [facility] following an office visit 12/07/2013 due to a significant decline in his performance status.  Avastin placed on hold beginning 03/31/2014 due to increased urine protein. 24-hour urine on 04/02/2014 showed 814 mg of protein.  03/31/2014 CEA mildly increased at 20.3 as compared to 15.6 on 03/03/2014 and 10.4 on 02/03/2014.  Xeloda placed on hold beginning 04/14/2014 pending upcoming CT scans.  CT scan chest/abdomen/pelvis 04/29/2014 showed an enlarging stomach mass involving the anterior wall in the antral region measuring approximately 5 cm. The mass was narrowing the gastric lumen. Peritoneal implants in the left upper quadrant were noted to be slightly more enlarged.  Upper endoscopy 05/04/2014 with findings of a 5 x 5 cm mass in the gastric antrum. Biopsy showed metastatic adenocarcinoma.  Initiation of gastric radiation and concurrent Xeloda 05/25/2014; completed 06/14/2014.  Restaging CT scans chest/abdomen/pelvis 07/27/2014 with mild increase in the size of several small abdominal peritoneal metastases and mild decrease in size of the soft tissue mass involving the anterior wall of the gastric antrum. No new sites of metastatic disease identified. No evidence of metastatic disease involving the chest. Moderate to severe emphysema.  Cycle 1 irinotecan/panitumumab 08/04/2014  Cycle 2 irinotecan/Panitumumab 08/18/2014.  Cycle 3  irinotecan/panitumumab 09/01/2014.  Cycle 4 irinotecan/panitumumab 09/22/2014 CEA improved 09/15/2014.  Treatment held 09/15/2014 due to diarrhea.  Cycle 4 irinotecan/Panitumumab 09/22/2014 (irinotecan dose reduced due to diarrhea). 2. Status post Port-A-Cath placement 04/27/2013. 3. Status post resection of a stage I non-small cell lung cancer left upper lung with postop brachytherapy June 2007. 4. 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 Radiographic complete regression on Avastin based chemotherapy. 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 6. Type 2 diabetes. 7. History of nephrolithiasis. 8. Status post bilateral corneal transplants. 9. Increased urine protein on Avastin. 10. Intermittent upper abdominal pain secondary to gastric mass. Improved. 11. Delayed nausea and diarrhea following cycle 1 irinotecan/panitumumab-the anti-emetic premedication was adjusted with cycle 2. Nausea improved with cycle 2. Persistent diarrhea. 12. Rash secondary to Panitumumab. He continues minocycline.  13. Weight loss. 14. Diarrhea most likely related to irinotecan. Irinotecan dose reduced beginning with cycle 4 09/22/2014.   Disposition:  [name] has completed 4 treatments with irinotecan/panitumumab. We will check the CEA today. The plan is to proceed with cycle 5 today. He knows to contact us for increased diarrhea or new symptoms. He will contact us if the skin rash worsens and we will consider a steroid cream. He plans a vacation in the next 2 weeks. He will return for an office visit and chemotherapy 10/27/2014.  Betsy Coder, MD  10/06/2014  9:48 AM

## 2014-10-07 LAB — CEA: CEA: 44.9 ng/mL — ABNORMAL HIGH (ref 0.0–5.0)

## 2014-10-24 ENCOUNTER — Other Ambulatory Visit: Payer: Self-pay | Admitting: Oncology

## 2014-10-27 ENCOUNTER — Ambulatory Visit (HOSPITAL_BASED_OUTPATIENT_CLINIC_OR_DEPARTMENT_OTHER): Payer: Medicare Other | Admitting: Nurse Practitioner

## 2014-10-27 ENCOUNTER — Other Ambulatory Visit: Payer: Self-pay

## 2014-10-27 ENCOUNTER — Other Ambulatory Visit (HOSPITAL_BASED_OUTPATIENT_CLINIC_OR_DEPARTMENT_OTHER): Payer: Medicare Other

## 2014-10-27 ENCOUNTER — Telehealth: Payer: Self-pay | Admitting: Oncology

## 2014-10-27 ENCOUNTER — Ambulatory Visit (HOSPITAL_BASED_OUTPATIENT_CLINIC_OR_DEPARTMENT_OTHER): Payer: Medicare Other

## 2014-10-27 VITALS — BP 127/65 | HR 90 | Temp 97.5°F | Resp 18 | Ht 67.0 in | Wt 154.7 lb

## 2014-10-27 DIAGNOSIS — C185 Malignant neoplasm of splenic flexure: Secondary | ICD-10-CM

## 2014-10-27 DIAGNOSIS — C786 Secondary malignant neoplasm of retroperitoneum and peritoneum: Secondary | ICD-10-CM

## 2014-10-27 DIAGNOSIS — C189 Malignant neoplasm of colon, unspecified: Secondary | ICD-10-CM

## 2014-10-27 DIAGNOSIS — Z5112 Encounter for antineoplastic immunotherapy: Secondary | ICD-10-CM

## 2014-10-27 DIAGNOSIS — G620 Drug-induced polyneuropathy: Secondary | ICD-10-CM

## 2014-10-27 DIAGNOSIS — R634 Abnormal weight loss: Secondary | ICD-10-CM

## 2014-10-27 DIAGNOSIS — R21 Rash and other nonspecific skin eruption: Secondary | ICD-10-CM

## 2014-10-27 DIAGNOSIS — C799 Secondary malignant neoplasm of unspecified site: Secondary | ICD-10-CM

## 2014-10-27 DIAGNOSIS — Z5111 Encounter for antineoplastic chemotherapy: Secondary | ICD-10-CM

## 2014-10-27 LAB — CBC WITH DIFFERENTIAL/PLATELET
BASO%: 0.3 % (ref 0.0–2.0)
Basophils Absolute: 0 10*3/uL (ref 0.0–0.1)
EOS%: 5.6 % (ref 0.0–7.0)
Eosinophils Absolute: 0.6 10*3/uL — ABNORMAL HIGH (ref 0.0–0.5)
HEMATOCRIT: 47.9 % (ref 38.4–49.9)
HGB: 15.2 g/dL (ref 13.0–17.1)
LYMPH%: 5.6 % — AB (ref 14.0–49.0)
MCH: 28.5 pg (ref 27.2–33.4)
MCHC: 31.7 g/dL — ABNORMAL LOW (ref 32.0–36.0)
MCV: 89.7 fL (ref 79.3–98.0)
MONO#: 0.9 10*3/uL (ref 0.1–0.9)
MONO%: 8.9 % (ref 0.0–14.0)
NEUT#: 8.1 10*3/uL — ABNORMAL HIGH (ref 1.5–6.5)
NEUT%: 79.6 % — ABNORMAL HIGH (ref 39.0–75.0)
PLATELETS: 134 10*3/uL — AB (ref 140–400)
RBC: 5.34 10*6/uL (ref 4.20–5.82)
RDW: 16.4 % — ABNORMAL HIGH (ref 11.0–14.6)
WBC: 10.2 10*3/uL (ref 4.0–10.3)
lymph#: 0.6 10*3/uL — ABNORMAL LOW (ref 0.9–3.3)

## 2014-10-27 LAB — COMPREHENSIVE METABOLIC PANEL (CC13)
ALK PHOS: 92 U/L (ref 40–150)
ALT: 14 U/L (ref 0–55)
ANION GAP: 7 meq/L (ref 3–11)
AST: 15 U/L (ref 5–34)
Albumin: 3 g/dL — ABNORMAL LOW (ref 3.5–5.0)
BILIRUBIN TOTAL: 0.65 mg/dL (ref 0.20–1.20)
BUN: 12.6 mg/dL (ref 7.0–26.0)
CO2: 28 meq/L (ref 22–29)
Calcium: 10.1 mg/dL (ref 8.4–10.4)
Chloride: 105 mEq/L (ref 98–109)
Creatinine: 1 mg/dL (ref 0.7–1.3)
Glucose: 134 mg/dl (ref 70–140)
Potassium: 4.4 mEq/L (ref 3.5–5.1)
Sodium: 140 mEq/L (ref 136–145)
Total Protein: 6.1 g/dL — ABNORMAL LOW (ref 6.4–8.3)

## 2014-10-27 LAB — MAGNESIUM (CC13): Magnesium: 1.8 mg/dl (ref 1.5–2.5)

## 2014-10-27 MED ORDER — DEXAMETHASONE SODIUM PHOSPHATE 10 MG/ML IJ SOLN
10.0000 mg | Freq: Once | INTRAMUSCULAR | Status: AC
  Administered 2014-10-27: 10 mg via INTRAVENOUS

## 2014-10-27 MED ORDER — DEXAMETHASONE SODIUM PHOSPHATE 10 MG/ML IJ SOLN
INTRAMUSCULAR | Status: AC
  Filled 2014-10-27: qty 1

## 2014-10-27 MED ORDER — PALONOSETRON HCL INJECTION 0.25 MG/5ML
0.2500 mg | Freq: Once | INTRAVENOUS | Status: AC
  Administered 2014-10-27: 0.25 mg via INTRAVENOUS

## 2014-10-27 MED ORDER — IRINOTECAN HCL CHEMO INJECTION 100 MG/5ML
90.0000 mg/m2 | Freq: Once | INTRAVENOUS | Status: AC
  Administered 2014-10-27: 160 mg via INTRAVENOUS
  Filled 2014-10-27: qty 8

## 2014-10-27 MED ORDER — ATROPINE SULFATE 1 MG/ML IJ SOLN
INTRAMUSCULAR | Status: AC
Start: 2014-10-27 — End: 2014-10-27
  Filled 2014-10-27: qty 1

## 2014-10-27 MED ORDER — DIPHENOXYLATE-ATROPINE 2.5-0.025 MG PO TABS: 1.0000 | ORAL_TABLET | Freq: Four times a day (QID) | ORAL | Status: AC | PRN

## 2014-10-27 MED ORDER — SODIUM CHLORIDE 0.9 % IV SOLN
150.0000 mg | Freq: Once | INTRAVENOUS | Status: AC
  Administered 2014-10-27: 150 mg via INTRAVENOUS
  Filled 2014-10-27: qty 5

## 2014-10-27 MED ORDER — SODIUM CHLORIDE 0.9 % IV SOLN
6.0000 mg/kg | Freq: Once | INTRAVENOUS | Status: AC
  Administered 2014-10-27: 460 mg via INTRAVENOUS
  Filled 2014-10-27: qty 23

## 2014-10-27 MED ORDER — ATROPINE SULFATE 1 MG/ML IJ SOLN
0.5000 mg | Freq: Once | INTRAMUSCULAR | Status: AC | PRN
  Administered 2014-10-27: 0.5 mg via INTRAVENOUS

## 2014-10-27 MED ORDER — SODIUM CHLORIDE 0.9 % IV SOLN
Freq: Once | INTRAVENOUS | Status: AC
  Administered 2014-10-27: 12:00:00 via INTRAVENOUS

## 2014-10-27 MED ORDER — SODIUM CHLORIDE 0.9 % IJ SOLN
10.0000 mL | INTRAMUSCULAR | Status: DC | PRN
  Administered 2014-10-27: 10 mL
  Filled 2014-10-27: qty 10

## 2014-10-27 MED ORDER — HEPARIN SOD (PORK) LOCK FLUSH 100 UNIT/ML IV SOLN
500.0000 [IU] | Freq: Once | INTRAVENOUS | Status: AC | PRN
  Administered 2014-10-27: 500 [IU]
  Filled 2014-10-27: qty 5

## 2014-10-27 MED ORDER — PALONOSETRON HCL INJECTION 0.25 MG/5ML
INTRAVENOUS | Status: AC
  Filled 2014-10-27: qty 5

## 2014-10-27 NOTE — Patient Instructions (Signed)
[facility] Discharge Instructions for Patients Receiving Chemotherapy  Today you received the following chemotherapy agents vectibix and irinotecan  To help prevent nausea and vomiting after your treatment, we encourage you to take your nausea medication compazine if you need it. May use zofran after 3 days if needed   If you develop nausea and vomiting that is not controlled by your nausea medication, call the clinic.   BELOW ARE SYMPTOMS THAT SHOULD BE REPORTED IMMEDIATELY:  *FEVER GREATER THAN 100.5 F  *CHILLS WITH OR WITHOUT FEVER  NAUSEA AND VOMITING THAT IS NOT CONTROLLED WITH YOUR NAUSEA MEDICATION  *UNUSUAL SHORTNESS OF BREATH  *UNUSUAL BRUISING OR BLEEDING  TENDERNESS IN MOUTH AND THROAT WITH OR WITHOUT PRESENCE OF ULCERS  *URINARY PROBLEMS  *BOWEL PROBLEMS  UNUSUAL RASH Items with * indicate a potential emergency and should be followed up as soon as possible.  Feel free to call the clinic you have any questions or concerns. The clinic phone number is [phone].

## 2014-10-27 NOTE — Telephone Encounter (Signed)
gv adn printed appt sched and avs for pt for NOV...sed added tx...gv pt barium

## 2014-10-27 NOTE — Progress Notes (Signed)
[facility] OFFICE PROGRESS NOTE   Diagnosis:  Colon cancer  INTERVAL HISTORY:   [name] returns as scheduled. He completed cycle 5 irinotecan/Panitumumab on 10/06/2014. He noted less diarrhea. He takes Lomotil as needed. No nausea or vomiting. No mouth sores. The skin rash is stable, pruritic. He continues to have intermittent abdominal pain. He intermittently feels "cold". No shaking chills or fever.  Objective:  Vital signs in last 24 hours:  Blood pressure 127/65, pulse 90, temperature 97.5 F (36.4 C), temperature source Oral, resp. rate 18, height 5' 7"  (1.702 m), weight 154 lb 11.2 oz (70.171 kg).    HEENT: no thrush or ulcers. Resp: lungs clear bilaterally. Cardio: regular rate and rhythm. GI: abdomen is soft. Tender fullness/firmness just superior to the umbilicus and left of the midline scar. No hepatomegaly. Vascular: no leg edema. Neuro:alert and oriented.  Skin:acne-type rash over the chest, back.  Port-A-Cath without erythema.    Lab Results:  Lab Results  Component Value Date   WBC 10.2 10/27/2014   HGB 15.2 10/27/2014   HCT 47.9 10/27/2014   MCV 89.7 10/27/2014   PLT 134* 10/27/2014   NEUTROABS 8.1* 10/27/2014    Imaging:  No results found.  Medications: I have reviewed the patient's current medications.  Assessment/Plan: 1. 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  Initially diagnosed with stage III colon cancer July 2012 status post left colectomy 07/09/2011. Microsatellite stable   K-ras mutation not detected on initial testing. Extended testing returned negative for the K-ras mutation.   Adjuvant Xeloda initiated July 2012.   Rise in the CEA tumor marker and 2 intra-abdominal soft tissue densities on CT scan November 2012 while on adjuvant Xeloda chemotherapy.   Oxaliplatin and Avastin were added to the regimen beginning 11/26/2011 with a decrease in the CEA and continued through 04/23/2012. Oxaliplatin  discontinued at that time due to cumulative neurotoxicity.   Xeloda and Avastin continued.   Restaging CT evaluation 04/09/2013 showed an increase in the previously noted peritoneal implants.   FOLFIRI initiated 04/29/2013. Irinotecan dose reduced following cycle 1 due to mucositis.   Avastin added beginning with cycle 3.   Restaging CT evaluation 07/27/2013 (after 6 cycles) showed interval resolution of a previously described left lower lobe nodule. Interval improvement in peritoneal nodularity. He completed a total of 14 cycles through 11/23/2013. Treatment was adjusted to [facility] following an office visit 12/07/2013 due to a significant decline in his performance status.   Avastin placed on hold beginning 03/31/2014 due to increased urine protein. 24-hour urine on 04/02/2014 showed 814 mg of protein.   03/31/2014 CEA mildly increased at 20.3 as compared to 15.6 on 03/03/2014 and 10.4 on 02/03/2014.   Xeloda placed on hold beginning 04/14/2014 pending upcoming CT scans.   CT scan chest/abdomen/pelvis 04/29/2014 showed an enlarging stomach mass involving the anterior wall in the antral region measuring approximately 5 cm. The mass was narrowing the gastric lumen. Peritoneal implants in the left upper quadrant were noted to be slightly more enlarged.   Upper endoscopy 05/04/2014 with findings of a 5 x 5 cm mass in the gastric antrum. Biopsy showed metastatic adenocarcinoma.   Initiation of gastric radiation and concurrent Xeloda 05/25/2014; completed 06/14/2014.   Restaging CT scans chest/abdomen/pelvis 07/27/2014 with mild increase in the size of several small abdominal peritoneal metastases and mild decrease in size of the soft tissue mass involving the anterior wall of the gastric antrum. No new sites of metastatic disease identified. No evidence of metastatic  disease involving the chest. Moderate to severe emphysema.   Cycle 1 irinotecan/panitumumab 08/04/2014    Cycle 2 irinotecan/Panitumumab 08/18/2014.   Cycle 3 irinotecan/panitumumab 09/01/2014.   CEA improved 09/15/2014.   Treatment held 09/15/2014 due to diarrhea.   Cycle 4 irinotecan/Panitumumab 09/22/2014 (irinotecan dose reduced due to diarrhea).  Cycle 5 irinotecan/Panitumumab 10/06/2014. 2. Status post Port-A-Cath placement 04/27/2013. 3. Status post resection of a stage I non-small cell lung cancer left upper lung with postop brachytherapy June 2007. 4. 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 Radiographic complete regression on Avastin based chemotherapy. 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 6. Type 2 diabetes. 7. History of nephrolithiasis. 8. Status post bilateral corneal transplants. 9. Increased urine protein on Avastin. 10. Intermittent upper abdominal pain secondary to gastric mass. Improved. 11. Delayed nausea and diarrhea following cycle 1 irinotecan/panitumumab-the anti-emetic premedication was adjusted with cycle 2. Nausea improved with cycle 2. Persistent diarrhea. 12. Rash secondary to Panitumumab. He continues minocycline.  13. Weight loss. 14. Diarrhea most likely related to irinotecan. Irinotecan dose reduced beginning with cycle 4 09/22/2014.   Disposition:[name] appears unchanged. He has completed 5 treatments with irinotecan/Panitumumab. Plan to proceed with cycle 6 today as scheduled. He will return for a followup visit in 2 weeks with restaging CT scans the day prior. He will contact the office in the interim with any problems. We specifically discussed poorly controlled diarrhea.  Plan reviewed with Dr. Benay Spice.    Ned Card ANP/GNP-BC   10/27/2014  11:04 AM

## 2014-10-28 LAB — CEA: CEA: 62.5 ng/mL — ABNORMAL HIGH (ref 0.0–5.0)

## 2014-10-29 ENCOUNTER — Other Ambulatory Visit: Payer: Self-pay | Admitting: Internal Medicine

## 2014-10-29 DIAGNOSIS — R29898 Other symptoms and signs involving the musculoskeletal system: Secondary | ICD-10-CM

## 2014-11-07 ENCOUNTER — Other Ambulatory Visit: Payer: Self-pay | Admitting: Oncology

## 2014-11-09 ENCOUNTER — Ambulatory Visit
Admission: RE | Admit: 2014-11-09 | Discharge: 2014-11-09 | Disposition: A | Discharge status: Home / Self Care | Payer: Medicare Other | Source: Ambulatory Visit | Attending: Internal Medicine | Admitting: Internal Medicine

## 2014-11-09 ENCOUNTER — Encounter (HOSPITAL_COMMUNITY): Payer: Self-pay

## 2014-11-09 ENCOUNTER — Ambulatory Visit (HOSPITAL_COMMUNITY)
Admission: RE | Admit: 2014-11-09 | Discharge: 2014-11-09 | Disposition: A | Discharge status: Home / Self Care | Payer: Medicare Other | Source: Ambulatory Visit | Attending: Nurse Practitioner | Admitting: Nurse Practitioner

## 2014-11-09 ENCOUNTER — Other Ambulatory Visit (HOSPITAL_BASED_OUTPATIENT_CLINIC_OR_DEPARTMENT_OTHER): Payer: Medicare Other

## 2014-11-09 DIAGNOSIS — J849 Interstitial pulmonary disease, unspecified: Secondary | ICD-10-CM | POA: Diagnosis not present

## 2014-11-09 DIAGNOSIS — R29898 Other symptoms and signs involving the musculoskeletal system: Secondary | ICD-10-CM

## 2014-11-09 DIAGNOSIS — C3490 Malignant neoplasm of unspecified part of unspecified bronchus or lung: Secondary | ICD-10-CM | POA: Diagnosis present

## 2014-11-09 DIAGNOSIS — J984 Other disorders of lung: Secondary | ICD-10-CM | POA: Diagnosis not present

## 2014-11-09 DIAGNOSIS — J432 Centrilobular emphysema: Secondary | ICD-10-CM | POA: Insufficient documentation

## 2014-11-09 DIAGNOSIS — C786 Secondary malignant neoplasm of retroperitoneum and peritoneum: Secondary | ICD-10-CM | POA: Diagnosis not present

## 2014-11-09 DIAGNOSIS — R599 Enlarged lymph nodes, unspecified: Secondary | ICD-10-CM | POA: Insufficient documentation

## 2014-11-09 DIAGNOSIS — C185 Malignant neoplasm of splenic flexure: Secondary | ICD-10-CM

## 2014-11-09 DIAGNOSIS — R935 Abnormal findings on diagnostic imaging of other abdominal regions, including retroperitoneum: Secondary | ICD-10-CM | POA: Diagnosis not present

## 2014-11-09 DIAGNOSIS — C189 Malignant neoplasm of colon, unspecified: Secondary | ICD-10-CM | POA: Diagnosis present

## 2014-11-09 DIAGNOSIS — C787 Secondary malignant neoplasm of liver and intrahepatic bile duct: Secondary | ICD-10-CM | POA: Diagnosis not present

## 2014-11-09 DIAGNOSIS — C799 Secondary malignant neoplasm of unspecified site: Secondary | ICD-10-CM

## 2014-11-09 LAB — CBC WITH DIFFERENTIAL/PLATELET
BASO%: 1.2 % (ref 0.0–2.0)
Basophils Absolute: 0.2 10*3/uL — ABNORMAL HIGH (ref 0.0–0.1)
EOS ABS: 0.5 10*3/uL (ref 0.0–0.5)
EOS%: 3.8 % (ref 0.0–7.0)
HEMATOCRIT: 48.2 % (ref 38.4–49.9)
HEMOGLOBIN: 15.1 g/dL (ref 13.0–17.1)
LYMPH%: 5.5 % — ABNORMAL LOW (ref 14.0–49.0)
MCH: 27.7 pg (ref 27.2–33.4)
MCHC: 31.4 g/dL — ABNORMAL LOW (ref 32.0–36.0)
MCV: 88.1 fL (ref 79.3–98.0)
MONO#: 1 10*3/uL — AB (ref 0.1–0.9)
MONO%: 8.4 % (ref 0.0–14.0)
NEUT%: 81.1 % — AB (ref 39.0–75.0)
NEUTROS ABS: 9.9 10*3/uL — AB (ref 1.5–6.5)
PLATELETS: 196 10*3/uL (ref 140–400)
RBC: 5.47 10*6/uL (ref 4.20–5.82)
RDW: 17 % — ABNORMAL HIGH (ref 11.0–14.6)
WBC: 12.2 10*3/uL — AB (ref 4.0–10.3)
lymph#: 0.7 10*3/uL — ABNORMAL LOW (ref 0.9–3.3)

## 2014-11-09 LAB — COMPREHENSIVE METABOLIC PANEL (CC13)
ALT: 16 U/L (ref 0–55)
ANION GAP: 9 meq/L (ref 3–11)
AST: 14 U/L (ref 5–34)
Albumin: 3.2 g/dL — ABNORMAL LOW (ref 3.5–5.0)
Alkaline Phosphatase: 106 U/L (ref 40–150)
BUN: 11.9 mg/dL (ref 7.0–26.0)
CALCIUM: 10.2 mg/dL (ref 8.4–10.4)
CHLORIDE: 103 meq/L (ref 98–109)
CO2: 28 meq/L (ref 22–29)
CREATININE: 1.1 mg/dL (ref 0.7–1.3)
GLUCOSE: 108 mg/dL (ref 70–140)
Potassium: 4.1 mEq/L (ref 3.5–5.1)
Sodium: 141 mEq/L (ref 136–145)
TOTAL PROTEIN: 6.3 g/dL — AB (ref 6.4–8.3)
Total Bilirubin: 0.51 mg/dL (ref 0.20–1.20)

## 2014-11-09 MED ORDER — GADOBENATE DIMEGLUMINE 529 MG/ML IV SOLN
14.0000 mL | Freq: Once | INTRAVENOUS | Status: AC | PRN
  Administered 2014-11-09: 14 mL via INTRAVENOUS

## 2014-11-09 MED ORDER — IOHEXOL 300 MG/ML  SOLN
100.0000 mL | Freq: Once | INTRAMUSCULAR | Status: AC | PRN
  Administered 2014-11-09: 100 mL via INTRAVENOUS

## 2014-11-10 ENCOUNTER — Ambulatory Visit: Payer: Medicare Other

## 2014-11-10 ENCOUNTER — Other Ambulatory Visit: Payer: Medicare Other

## 2014-11-10 ENCOUNTER — Telehealth: Payer: Self-pay | Admitting: Oncology

## 2014-11-10 ENCOUNTER — Ambulatory Visit (HOSPITAL_BASED_OUTPATIENT_CLINIC_OR_DEPARTMENT_OTHER): Payer: Medicare Other | Admitting: Oncology

## 2014-11-10 VITALS — BP 111/68 | HR 94 | Temp 97.7°F | Resp 17 | Ht 67.0 in | Wt 152.0 lb

## 2014-11-10 DIAGNOSIS — C189 Malignant neoplasm of colon, unspecified: Secondary | ICD-10-CM

## 2014-11-10 LAB — CEA: CEA: 84 ng/mL — AB (ref 0.0–5.0)

## 2014-11-10 NOTE — Progress Notes (Signed)
[facility] OFFICE PROGRESS NOTE   Diagnosis: Colon cancer  INTERVAL HISTORY:   Mr. Vanroekel returns as scheduled.he completed another treatment with irinotecan/panitumumab on 10/27/2014. He reports increased malaise following this cycle of chemotherapy. He continues to feel "weak ". Skin rash is stable. No significant diarrhea following the last cycle of chemotherapy. The abdominal pain has increased. He takes hydrocodone approximately once daily for relief of pain. He also takes ibuprofen. He has developed a right foot drop over the past 5 weeks. An MRI of the brain on 11/09/2014 revealed no acute abnormality or metastases. An MRI of the lumbar spine on 11/09/2014 revealed no evidence of fracture or metastatic disease. Chronic by foraminal stenosis was noted at L5-S1 with probable chronic bilateral L5 nerve root encroachment. Dr. Joylene Draft has referred him to neurosurgery.  Objective:  Vital signs in last 24 hours:  Blood pressure 111/68, pulse 94, temperature 97.7 F (36.5 C), temperature source Oral, resp. rate 17, height 5' 7"  (1.702 m), weight 152 lb (68.947 kg), SpO2 97 %.    HEENT: small ulcers at the right anterior and tip of the tongue. No thrush. Resp: lungs clear bilaterally Cardio: regular rate and rhythm GI: no hepatomegaly, no mass, mild tenderness in the mid abdomen Vascular: no leg edema Neuro:2-3 /5 strength with dorsiflexion at the right foot , the motor exam appears otherwise intact in the right leg Skin:dry erythematous maculopapular rash over the face and trunk   Portacath/PICC-without erythema  Lab Results:  Lab Results  Component Value Date   WBC 12.2* 11/09/2014   HGB 15.1 11/09/2014   HCT 48.2 11/09/2014   MCV 88.1 11/09/2014   PLT 196 11/09/2014   NEUTROABS 9.9* 11/09/2014      Lab Results  Component Value Date   CEA 84.0* 11/09/2014    Imaging:  Ct Chest W Contrast  11/09/2014   CLINICAL DATA:  Colon cancer diagnosed 2012.  Chemotherapy in progress. Lung cancer diagnosed 2007.  EXAM: CT CHEST, ABDOMEN, AND PELVIS WITH CONTRAST  TECHNIQUE: Multidetector CT imaging of the chest, abdomen and pelvis was performed following the standard protocol during bolus administration of intravenous contrast.  CONTRAST:  174m OMNIPAQUE IOHEXOL 300 MG/ML  SOLN  COMPARISON:  CT 07/27/2014  FINDINGS: CT CHEST FINDINGS  No axillary supraclavicular lymphadenopathy. Port in the right chest wall. No mediastinal or hilar lymphadenopathy. No pericardial fluid. No central pulmonary embolism.  There is severe centrilobular emphysema. There is post surgical scarring and volume loss in the left upper lobe. No new nodularity. There is reticular pattern at the lung bases consists with interstitial lung disease.  CT ABDOMEN AND PELVIS FINDINGS  Hepatobiliary: Interval increase in size of hepatic metastasis along the inferior margin of the right hepatic lobe with lesion measuring 22 mm on image 71 increased from 13 mm on prior. No additional hepatic lesions. There is a peritoneal implant adjacent to this hepatic metastasis which similar in size at 13 mm.  Pancreas: The pancreas is grossly normal. There is adenopathy superior to the pancreas which is confluent with thickening of the gastric antrum (image 59, series 2).  Spleen: Normal spleen  Adrenals/Urinary Tract: Adrenal glands are normal. The kidneys, ureters, and bladder normal.  Stomach/Bowel: Thickened the gastric antrum which extends to the pancreatic head along the second portion duodenum. This gastric thickening is slightly increased. No bowel obstruction. Small bowel is normal. The appendix is normal. Colon rectosigmoid colon are normal.  Vascular/Lymphatic: Abdominal aorta is normal caliber. There is no retroperitoneal  or periportal lymphadenopathy. No pelvic lymphadenopathy.  Reproductive: Prostate gland is normal.  Other: Interval mild progression of peritoneal metastasis. Lesion along the ventral  abdominal wall midline measures 21 mm (image 85, series 2) increased from 19 mm on prior. No lesion more superior in the right upper quadrant along the ventral peritoneal surface measures 18 mm increased from 14 mm on prior remeasured (image 66, series 2). Lesion in the low left upper quadrant measures 19 x 28 mm compared to 17 x 30 mm on prior.  Musculoskeletal: No aggressive osseous lesion.  IMPRESSION: Chest Impression:  1. No evidence of thoracic metastasis. 2. Stable postoperative change in the left upper lobe. 3. Severe emphysema.  Abdomen / Pelvis Impression:  1. Progression of right hepatic lobe metastasis adjacent to a peritoneal implant. 2. Mild progression of peritoneal metastasis primarily on the ventral peritoneal surface. 3. Thickening of the gastric antrum extending along the second portion duodenum consistent with peritoneal serosal implants. Mild progression of this disease additionally.   Electronically Signed   By: Suzy Bouchard M.D.   On: 11/09/2014 17:58   Mr Jeri Cos AS Contrast  11/09/2014   CLINICAL DATA:  Right leg weakness and loss of coordination for 1 month. History of lung cancer in 2007. History of colon cancer currently undergoing radiation and chemotherapy.  EXAM: MRI HEAD WITHOUT AND WITH CONTRAST  TECHNIQUE: Multiplanar, multiecho pulse sequences of the brain and surrounding structures were obtained without and with intravenous contrast.  CONTRAST:  6m MULTIHANCE GADOBENATE DIMEGLUMINE 529 MG/ML IV SOLN  COMPARISON:  None.  FINDINGS: There is no evidence of acute infarct, intracranial hemorrhage, mass, midline shift, or extra-axial fluid collection. There is moderate generalized cerebral atrophy. Scattered, small foci of T2 hyperintensity in the cerebral white matter are nonspecific but compatible with mild chronic small vessel ischemic disease. There is no abnormal enhancement.  Prior left cataract extraction is noted. There is a small left maxillary sinus mucous retention  cyst. Mastoid air cells are clear. Major intracranial vascular flow voids are preserved.  IMPRESSION: 1. No acute intracranial abnormality or metastases. 2. Mild chronic small vessel ischemic disease and moderate cerebral atrophy.   Electronically Signed   By: ALogan Bores  On: 11/09/2014 17:00   Mr Lumbar Spine W Wo Contrast  11/09/2014   CLINICAL DATA:  Right leg weakness with loss of coordination for 1 month. History of lung and colon cancer. No recent injury or prior relevant surgery. Initial encounter.  EXAM: MRI LUMBAR SPINE WITHOUT AND WITH CONTRAST  TECHNIQUE: Multiplanar and multiecho pulse sequences of the lumbar spine were obtained without and with intravenous contrast.  CONTRAST:  14 ml MultiHance.  COMPARISON:  Abdominal pelvic CT 07/27/2014.  FINDINGS: Prior studies demonstrate 5 lumbar type vertebral bodies. There is a mild convex right scoliosis. There are bilateral L5 pars defects with 7 mm of anterolisthesis at L5-S1. There is no evidence of acute fracture or metastatic disease. Scattered hemangiomas and endplate degenerative changes are noted.  The conus medullaris extends to the L1 level and appears normal. There is no abnormal intradural enhancement or paraspinal abnormality.  There is disc degeneration with anterior osteophytes at T11-12 and T12-L1. There is no significant resulting spinal stenosis or nerve root encroachment.  L1-2: There is chronic disc degeneration with loss of disc height, annular disc bulging and central osteophytes, similar to prior CT. There is mass effect on the thecal sac with mild left greater than right lateral recess and foraminal stenosis.  L2-3: Chronic  degenerative disc disease with annular disc bulging and paraspinal osteophytes. There is a small left paracentral disc protrusion and mild facet and ligamentous hypertrophy. These factors contribute to mild left lateral recess stenosis.  L3-4: Annular disc bulging and osteophytes are mildly asymmetric to the  right. In correlation with the prior CT, there is a small extruded disc fragment with vacuum phenomenon extending superiorly on the left. There is mild facet and ligamentous hypertrophy. No significant spinal stenosis or nerve root encroachment.  L4-5: Annular disc bulging with paraspinal osteophytes and mild facet and ligamentous hypertrophy. No significant spinal stenosis or nerve root encroachment.  L5-S1: Due to the bilateral L5 pars defects and resulting anterolisthesis, there is annular disc bulging and moderate to severe biforaminal stenosis. Chronic bilateral L5 nerve root encroachment is likely. The lateral recesses and foramina are patent. There is mild bilateral facet hypertrophy with a small right-sided synovial cyst, not contributing to nerve root encroachment.  IMPRESSION: 1. No acute findings or evidence of metastatic disease to the lumbar spine. 2. 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 There is probable chronic bilateral L5 nerve root encroachment. 3. Spondylosis at the additional levels as detailed above, not grossly changed compared with prior CT.   Electronically Signed   By: Camie Patience M.D.   On: 11/09/2014 15:27   Ct Abdomen Pelvis W Contrast  11/09/2014   CLINICAL DATA:  Colon cancer diagnosed 2012. Chemotherapy in progress. Lung cancer diagnosed 2007.  EXAM: CT CHEST, ABDOMEN, AND PELVIS WITH CONTRAST  TECHNIQUE: Multidetector CT imaging of the chest, abdomen and pelvis was performed following the standard protocol during bolus administration of intravenous contrast.  CONTRAST:  177m OMNIPAQUE IOHEXOL 300 MG/ML  SOLN  COMPARISON:  CT 07/27/2014  FINDINGS: CT CHEST FINDINGS  No axillary supraclavicular lymphadenopathy. Port in the right chest wall. No mediastinal or hilar lymphadenopathy. No pericardial fluid. No central pulmonary embolism.  There is severe centrilobular emphysema. There is  post surgical scarring and volume loss in the left upper lobe. No new nodularity. There is reticular pattern at the lung bases consists with interstitial lung disease.  CT ABDOMEN AND PELVIS FINDINGS  Hepatobiliary: Interval increase in size of hepatic metastasis along the inferior margin of the right hepatic lobe with lesion measuring 22 mm on image 71 increased from 13 mm on prior. No additional hepatic lesions. There is a peritoneal implant adjacent to this hepatic metastasis which similar in size at 13 mm.  Pancreas: The pancreas is grossly normal. There is adenopathy superior to the pancreas which is confluent with thickening of the gastric antrum (image 59, series 2).  Spleen: Normal spleen  Adrenals/Urinary Tract: Adrenal glands are normal. The kidneys, ureters, and bladder normal.  Stomach/Bowel: Thickened the gastric antrum which extends to the pancreatic head along the second portion duodenum. This gastric thickening is slightly increased. No bowel obstruction. Small bowel is normal. The appendix is normal. Colon rectosigmoid colon are normal.  Vascular/Lymphatic: Abdominal aorta is normal caliber. There is no retroperitoneal or periportal lymphadenopathy. No pelvic lymphadenopathy.  Reproductive: Prostate gland is normal.  Other: Interval mild progression of peritoneal metastasis. Lesion along the ventral abdominal wall midline measures 21 mm (image 85, series 2) increased from 19 mm on prior. No lesion more superior in the right upper quadrant along the ventral peritoneal surface measures 18 mm increased from 14 mm on prior remeasured (image 66, series 2). Lesion in the low left upper quadrant  measures 19 x 28 mm compared to 17 x 30 mm on prior.  Musculoskeletal: No aggressive osseous lesion.  IMPRESSION: Chest Impression:  1. No evidence of thoracic metastasis. 2. Stable postoperative change in the left upper lobe. 3. Severe emphysema.  Abdomen / Pelvis Impression:  1. Progression of right hepatic lobe  metastasis adjacent to a peritoneal implant. 2. Mild progression of peritoneal metastasis primarily on the ventral peritoneal surface. 3. Thickening of the gastric antrum extending along the second portion duodenum consistent with peritoneal serosal implants. Mild progression of this disease additionally.   Electronically Signed   By: Suzy Bouchard M.D.   On: 11/09/2014 17:58    Medications: I have reviewed the patient's current medications.  Assessment/Plan: 1. 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  Initially diagnosed with stage III colon cancer July 2012 status post left colectomy 07/09/2011. Microsatellite stable   K-ras mutation not detected on initial testing. Extended testing returned negative for the K-ras mutation.   Adjuvant Xeloda initiated July 2012.   Rise in the CEA tumor marker and 2 intra-abdominal soft tissue densities on CT scan November 2012 while on adjuvant Xeloda chemotherapy.   Oxaliplatin and Avastin were added to the regimen beginning 11/26/2011 with a decrease in the CEA and continued through 04/23/2012. Oxaliplatin discontinued at that time due to cumulative neurotoxicity.   Xeloda and Avastin continued.   Restaging CT evaluation 04/09/2013 showed an increase in the previously noted peritoneal implants.   FOLFIRI initiated 04/29/2013. Irinotecan dose reduced following cycle 1 due to mucositis.   Avastin added beginning with cycle 3.   Restaging CT evaluation 07/27/2013 (after 6 cycles) showed interval resolution of a previously described left lower lobe nodule. Interval improvement in peritoneal nodularity. He completed a total of 14 cycles through 11/23/2013. Treatment was adjusted to [facility] following an office visit 12/07/2013 due to a significant decline in his performance status.   Avastin placed on hold beginning 03/31/2014 due to increased urine protein. 24-hour urine on 04/02/2014 showed 814 mg of protein.    03/31/2014 CEA mildly increased at 20.3 as compared to 15.6 on 03/03/2014 and 10.4 on 02/03/2014.   Xeloda placed on hold beginning 04/14/2014 pending upcoming CT scans.   CT scan chest/abdomen/pelvis 04/29/2014 showed an enlarging stomach mass involving the anterior wall in the antral region measuring approximately 5 cm. The mass was narrowing the gastric lumen. Peritoneal implants in the left upper quadrant were noted to be slightly more enlarged.   Upper endoscopy 05/04/2014 with findings of a 5 x 5 cm mass in the gastric antrum. Biopsy showed metastatic adenocarcinoma.   Initiation of gastric radiation and concurrent Xeloda 05/25/2014; completed 06/14/2014.   Restaging CT scans chest/abdomen/pelvis 07/27/2014 with mild increase in the size of several small abdominal peritoneal metastases and mild decrease in size of the soft tissue mass involving the anterior wall of the gastric antrum. No new sites of metastatic disease identified. No evidence of metastatic disease involving the chest. Moderate to severe emphysema.   Cycle 1 irinotecan/panitumumab 08/04/2014   Cycle 2 irinotecan/Panitumumab 08/18/2014.   Cycle 3 irinotecan/panitumumab 09/01/2014.   CEA improved 09/15/2014.   Treatment held 09/15/2014 due to diarrhea.   Cycle 4 irinotecan/Panitumumab 09/22/2014 (irinotecan dose reduced due to diarrhea).  Cycle 5 irinotecan/Panitumumab 10/06/2014.  Cycle 6 irinotecan/panitumumab 10/27/2014  Restaging CT 11/09/2014 with enlargement of a liver metastasis, increased gastric antral thickening, and slight progression of peritoneal metastases  CEA increased 11/09/2014 2. Status post Port-A-Cath placement 04/27/2013. 3. Status post resection of  a stage I non-small cell lung cancer left upper lung with postop brachytherapy June 2007. 4. Vague, alveolar density right lower lung felt to be a second primary bronchial alveolar lung cancer found at the same time as his initial  cancer in the left upper lung. Radiographic complete regression on Avastin based chemotherapy. 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 6. Type 2 diabetes. 7. History of nephrolithiasis. 8. Status post bilateral corneal transplants. 9. Increased urine protein on Avastin. 10. Intermittent upper abdominal pain secondary to gastric mass. Improved. 11. Delayed nausea and diarrhea following cycle 1 irinotecan/panitumumab-the anti-emetic premedication was adjusted with cycle 2. Nausea improved with cycle 2. Persistent diarrhea. 12. Rash secondary to Panitumumab. He continues minocycline.  13. Weight loss. 14. Diarrhea most likely related to irinotecan. Irinotecan dose reduced beginning with cycle 4 09/22/2014. 15. Right foot drop-potentially related to spinal stenosis versus peripheral nerve compression in the setting of weight loss   Disposition:  Mr. Wynn has completed 6 treatments with irinotecan/panitumumab. There is clinical, laboratory, and CT evidence of disease progression. I reviewed the CT images with him today. The irinotecan/panitumumab will be discontinued. We will follow him with observation for now. Hopefully his performance status will improve over the next few weeks. He will take minocycline for 2 more weeks.  I discussed salvage treatment options with Mr. Rokosz. Standard treatment options are limited since he has received 5-FU, irinotecan, and oxaliplatin based therapies in the past. He has mild residual neuropathy symptoms from previous oxaliplatin therapy. We will consider resuming treatment with oxaliplatin, regular recommended, and Lonsurf therapies versus referring him for a clinical trial.  He will return for an office visit and further discussion 12/01/2014. He will contact us in the interim as needed. Betsy Coder, MD  11/10/2014  12:11 PM

## 2014-11-10 NOTE — Telephone Encounter (Signed)
Pt confirmed labs/ov per 11/18 POF, gave pt AVS..... KJ

## 2014-12-01 ENCOUNTER — Telehealth: Payer: Self-pay | Admitting: Nurse Practitioner

## 2014-12-01 ENCOUNTER — Ambulatory Visit (HOSPITAL_BASED_OUTPATIENT_CLINIC_OR_DEPARTMENT_OTHER): Payer: Medicare Other | Admitting: Nurse Practitioner

## 2014-12-01 ENCOUNTER — Ambulatory Visit: Payer: Medicare Other

## 2014-12-01 VITALS — BP 122/62 | HR 88 | Temp 97.5°F | Resp 16 | Wt 152.5 lb

## 2014-12-01 VITALS — BP 122/62 | HR 88 | Temp 97.5°F | Resp 20 | Ht 67.0 in | Wt 152.5 lb

## 2014-12-01 DIAGNOSIS — Z95828 Presence of other vascular implants and grafts: Secondary | ICD-10-CM

## 2014-12-01 DIAGNOSIS — C189 Malignant neoplasm of colon, unspecified: Secondary | ICD-10-CM

## 2014-12-01 DIAGNOSIS — R197 Diarrhea, unspecified: Secondary | ICD-10-CM

## 2014-12-01 DIAGNOSIS — C185 Malignant neoplasm of splenic flexure: Secondary | ICD-10-CM

## 2014-12-01 DIAGNOSIS — R21 Rash and other nonspecific skin eruption: Secondary | ICD-10-CM

## 2014-12-01 DIAGNOSIS — C787 Secondary malignant neoplasm of liver and intrahepatic bile duct: Secondary | ICD-10-CM

## 2014-12-01 DIAGNOSIS — M21371 Foot drop, right foot: Secondary | ICD-10-CM

## 2014-12-01 DIAGNOSIS — E119 Type 2 diabetes mellitus without complications: Secondary | ICD-10-CM

## 2014-12-01 DIAGNOSIS — C786 Secondary malignant neoplasm of retroperitoneum and peritoneum: Secondary | ICD-10-CM

## 2014-12-01 DIAGNOSIS — R110 Nausea: Secondary | ICD-10-CM

## 2014-12-01 DIAGNOSIS — G620 Drug-induced polyneuropathy: Secondary | ICD-10-CM

## 2014-12-01 MED ORDER — HEPARIN SOD (PORK) LOCK FLUSH 100 UNIT/ML IV SOLN
500.0000 [IU] | Freq: Once | INTRAVENOUS | Status: AC
  Administered 2014-12-01: 500 [IU] via INTRAVENOUS
  Filled 2014-12-01: qty 5

## 2014-12-01 MED ORDER — HYDROCODONE-ACETAMINOPHEN 5-325 MG PO TABS: 1.0000 | ORAL_TABLET | ORAL | Status: DC | PRN

## 2014-12-01 MED ORDER — SODIUM CHLORIDE 0.9 % IJ SOLN
10.0000 mL | INTRAMUSCULAR | Status: DC | PRN
  Administered 2014-12-01: 10 mL via INTRAVENOUS
  Filled 2014-12-01: qty 10

## 2014-12-01 NOTE — Progress Notes (Addendum)
[place] OFFICE PROGRESS NOTE   Diagnosis:  Colon cancer  INTERVAL HISTORY:   [name] returns as scheduled. He continues to feel somewhat weak. He has a persistent right foot drop and reports he is scheduled for "nerve decompression" next week. The skin rash is slowly improving. He continues minocycline. He notes increased abdominal pain. He estimates taking 3 hydrocodone tablets in a 24-hour timeframe. Appetite is poor though he has gained some weight. No nausea or vomiting. No diarrhea. He has mild numbness in the fingertips and bottom of his feet. The numbness does not interfere with activity. He has had intermittent chills over the past month. No associated fever.  Objective:  Vital signs in last 24 hours:  Blood pressure 122/62, pulse 88, temperature 97.5 F (36.4 C), temperature source Oral, resp. rate 20, height 5' 7"  (1.702 m), weight 152 lb 8 oz (69.174 kg).    HEENT: no thrush or ulcers. Resp: lungs clear bilaterally. Cardio: regular rate and rhythm. GI: abdomen is soft. No hepatomegaly. Firm mass like area with associated tenderness just superior and to the left of the umbilicus. Vascular: no leg edema. Neuro: right footdrop. Skin: persistent dry erythematous maculopapular rash over the face and trunk. Port-A-Cath without erythema.  Lab Results:  Lab Results  Component Value Date   WBC 12.2* 11/09/2014   HGB 15.1 11/09/2014   HCT 48.2 11/09/2014   MCV 88.1 11/09/2014   PLT 196 11/09/2014   NEUTROABS 9.9* 11/09/2014    Imaging:  No results found.  Medications: I have reviewed the patient's current medications.  Assessment/Plan: 1. 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  Initially diagnosed with stage III colon cancer July 2012 status post left colectomy 07/09/2011. Microsatellite stable   K-ras mutation not detected on initial testing. Extended testing returned negative for the K-ras mutation.   Adjuvant Xeloda  initiated July 2012.   Rise in the CEA tumor marker and 2 intra-abdominal soft tissue densities on CT scan November 2012 while on adjuvant Xeloda chemotherapy.   Oxaliplatin and Avastin were added to the regimen beginning 11/26/2011 with a decrease in the CEA and continued through 04/23/2012. Oxaliplatin discontinued at that time due to cumulative neurotoxicity.   Xeloda and Avastin continued.   Restaging CT evaluation 04/09/2013 showed an increase in the previously noted peritoneal implants.   FOLFIRI initiated 04/29/2013. Irinotecan dose reduced following cycle 1 due to mucositis.   Avastin added beginning with cycle 3.   Restaging CT evaluation 07/27/2013 (after 6 cycles) showed interval resolution of a previously described left lower lobe nodule. Interval improvement in peritoneal nodularity. He completed a total of 14 cycles through 11/23/2013. Treatment was adjusted to [facility] following an office visit 12/07/2013 due to a significant decline in his performance status.   Avastin placed on hold beginning 03/31/2014 due to increased urine protein. 24-hour urine on 04/02/2014 showed 814 mg of protein.   03/31/2014 CEA mildly increased at 20.3 as compared to 15.6 on 03/03/2014 and 10.4 on 02/03/2014.   Xeloda placed on hold beginning 04/14/2014 pending upcoming CT scans.   CT scan chest/abdomen/pelvis 04/29/2014 showed an enlarging stomach mass involving the anterior wall in the antral region measuring approximately 5 cm. The mass was narrowing the gastric lumen. Peritoneal implants in the left upper quadrant were noted to be slightly more enlarged.   Upper endoscopy 05/04/2014 with findings of a 5 x 5 cm mass in the gastric antrum. Biopsy showed metastatic adenocarcinoma.   Initiation of gastric radiation and concurrent  Xeloda 05/25/2014; completed 06/14/2014.   Restaging CT scans chest/abdomen/pelvis 07/27/2014 with mild increase in the size of several small  abdominal peritoneal metastases and mild decrease in size of the soft tissue mass involving the anterior wall of the gastric antrum. No new sites of metastatic disease identified. No evidence of metastatic disease involving the chest. Moderate to severe emphysema.   Cycle 1 irinotecan/panitumumab 08/04/2014   Cycle 2 irinotecan/Panitumumab 08/18/2014.   Cycle 3 irinotecan/panitumumab 09/01/2014.   CEA improved 09/15/2014.   Treatment held 09/15/2014 due to diarrhea.   Cycle 4 irinotecan/Panitumumab 09/22/2014 (irinotecan dose reduced due to diarrhea).  Cycle 5 irinotecan/Panitumumab 10/06/2014.  Cycle 6 irinotecan/panitumumab 10/27/2014  Restaging CT 11/09/2014 with enlargement of a liver metastasis, increased gastric antral thickening, and slight progression of peritoneal metastases  CEA increased 11/09/2014 2. Status post Port-A-Cath placement 04/27/2013. 3. Status post resection of a stage I non-small cell lung cancer left upper lung with postop brachytherapy June 2007. 4. 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 Radiographic complete regression on Avastin based chemotherapy. 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 6. Type 2 diabetes. 7. History of nephrolithiasis. 8. Status post bilateral corneal transplants. 9. Increased urine protein on Avastin. 10. Intermittent upper abdominal pain secondary to gastric mass. Improved. 11. Delayed nausea and diarrhea following cycle 1 irinotecan/panitumumab-the anti-emetic premedication was adjusted with cycle 2. Nausea improved with cycle 2. Persistent diarrhea. 12. Rash secondary to Panitumumab. He continues minocycline.  13. Weight loss. 14. Diarrhea most likely related to irinotecan. Irinotecan dose reduced beginning with cycle 4 09/22/2014. 15. Right foot drop-potentially  related to spinal stenosis versus peripheral nerve compression in the setting of weight loss. He is scheduled to undergo nerve decompression.    Disposition: [name] appears unchanged. We again reviewed salvage treatment options with [name] including FOLFOX/CAPOX, regorafenib, Lonsurf versus referring him for a clinical trial. He is interested in the possibility of a clinical trial. We made a referral to Dr. Reynaldo Minium at [facility]. He does not want to begin any treatment until after the holidays. We scheduled a return visit here on 12/29/2013. He will contact the office in the interim with any problems. We specifically discussed poor pain control, persistent chills, fever or other signs of infection.  Patient seen with Dr. Benay Spice. 25 minutes were spent face-to-face at today's visit with the majority of that time involved in counseling/coordination of care.    Ned Card ANP/GNP-BC   12/01/2014  2:36 PM   This was a shared visit with Ned Card. His performance status has improved since discontinuing irinotecan/panitumumab.  I recommend salvage therapy with FOLFOX or Capox. He would like to wait until after the holidays to resume treatment. He is interested in another opinion. We will refer him to Dr. Reynaldo Minium.  Julieanne Manson, M.D.

## 2014-12-01 NOTE — Telephone Encounter (Signed)
Pt confirmed labs/ov per 12/09 POF, gave pt AVS..... KJ, sent msg for referral to send information for referral to Dr. Clelia Croft at [facility].Marland KitchenMarland KitchenMarland Kitchen

## 2014-12-01 NOTE — Patient Instructions (Signed)

## 2014-12-02 ENCOUNTER — Telehealth: Payer: Self-pay | Admitting: Hematology and Oncology

## 2014-12-02 NOTE — Telephone Encounter (Signed)
Faxed pt medical records to [facility]. Ordered scans to be fedex'ed

## 2014-12-03 ENCOUNTER — Telehealth: Payer: Self-pay | Admitting: *Deleted

## 2014-12-03 NOTE — Telephone Encounter (Signed)
[name] called to ask for his scans to be put on CD for his visit with Dr. Verlan Friends on 12/09/14. Made him aware they are being FedEx'd but radiology will have a CD ready for him Monday afternoon. Requested his CTs of 07/27/14 and 11/09/14 as well as MRIs from 11/09/14. Radiology has his # and will call if there is a problem. Medical records have been sent to [facility] and they have access to his records through [place].

## 2014-12-07 ENCOUNTER — Telehealth: Payer: Self-pay | Admitting: Oncology

## 2014-12-07 NOTE — Telephone Encounter (Signed)
Medical records faxed to [facility] [phone]

## 2014-12-08 ENCOUNTER — Telehealth: Payer: Self-pay | Admitting: Oncology

## 2014-12-08 NOTE — Telephone Encounter (Signed)
Medical records faxed to [facility] @ [phone]

## 2014-12-29 ENCOUNTER — Ambulatory Visit (HOSPITAL_BASED_OUTPATIENT_CLINIC_OR_DEPARTMENT_OTHER): Payer: Medicare Other | Admitting: Nurse Practitioner

## 2014-12-29 ENCOUNTER — Telehealth: Payer: Self-pay | Admitting: Oncology

## 2014-12-29 ENCOUNTER — Other Ambulatory Visit (HOSPITAL_BASED_OUTPATIENT_CLINIC_OR_DEPARTMENT_OTHER): Payer: Medicare Other

## 2014-12-29 ENCOUNTER — Ambulatory Visit: Payer: Medicare Other

## 2014-12-29 VITALS — BP 106/56 | HR 91 | Temp 97.4°F | Resp 18 | Ht 67.0 in | Wt 150.8 lb

## 2014-12-29 DIAGNOSIS — D509 Iron deficiency anemia, unspecified: Secondary | ICD-10-CM

## 2014-12-29 DIAGNOSIS — G620 Drug-induced polyneuropathy: Secondary | ICD-10-CM

## 2014-12-29 DIAGNOSIS — C185 Malignant neoplasm of splenic flexure: Secondary | ICD-10-CM

## 2014-12-29 DIAGNOSIS — M21371 Foot drop, right foot: Secondary | ICD-10-CM

## 2014-12-29 DIAGNOSIS — R197 Diarrhea, unspecified: Secondary | ICD-10-CM

## 2014-12-29 DIAGNOSIS — C787 Secondary malignant neoplasm of liver and intrahepatic bile duct: Secondary | ICD-10-CM

## 2014-12-29 DIAGNOSIS — Z95828 Presence of other vascular implants and grafts: Secondary | ICD-10-CM

## 2014-12-29 DIAGNOSIS — C786 Secondary malignant neoplasm of retroperitoneum and peritoneum: Secondary | ICD-10-CM

## 2014-12-29 DIAGNOSIS — E119 Type 2 diabetes mellitus without complications: Secondary | ICD-10-CM

## 2014-12-29 DIAGNOSIS — C799 Secondary malignant neoplasm of unspecified site: Secondary | ICD-10-CM

## 2014-12-29 DIAGNOSIS — C189 Malignant neoplasm of colon, unspecified: Secondary | ICD-10-CM

## 2014-12-29 DIAGNOSIS — Z85118 Personal history of other malignant neoplasm of bronchus and lung: Secondary | ICD-10-CM

## 2014-12-29 DIAGNOSIS — R109 Unspecified abdominal pain: Secondary | ICD-10-CM

## 2014-12-29 DIAGNOSIS — R911 Solitary pulmonary nodule: Secondary | ICD-10-CM

## 2014-12-29 DIAGNOSIS — R21 Rash and other nonspecific skin eruption: Secondary | ICD-10-CM

## 2014-12-29 LAB — COMPREHENSIVE METABOLIC PANEL (CC13)
ALT: 10 U/L (ref 0–55)
AST: 12 U/L (ref 5–34)
Albumin: 2.9 g/dL — ABNORMAL LOW (ref 3.5–5.0)
Alkaline Phosphatase: 97 U/L (ref 40–150)
Anion Gap: 7 mEq/L (ref 3–11)
BUN: 13.5 mg/dL (ref 7.0–26.0)
CO2: 26 meq/L (ref 22–29)
CREATININE: 0.9 mg/dL (ref 0.7–1.3)
Calcium: 9.7 mg/dL (ref 8.4–10.4)
Chloride: 106 mEq/L (ref 98–109)
EGFR: 80 mL/min/{1.73_m2} — ABNORMAL LOW (ref >90)
Glucose: 158 mg/dl — ABNORMAL HIGH (ref 70–140)
Potassium: 4.1 mEq/L (ref 3.5–5.1)
SODIUM: 139 meq/L (ref 136–145)
TOTAL PROTEIN: 5.8 g/dL — AB (ref 6.4–8.3)
Total Bilirubin: 0.36 mg/dL (ref 0.20–1.20)

## 2014-12-29 LAB — CBC WITH DIFFERENTIAL/PLATELET
BASO%: 0.5 % (ref 0.0–2.0)
Basophils Absolute: 0.1 10*3/uL (ref 0.0–0.1)
EOS%: 5.5 % (ref 0.0–7.0)
Eosinophils Absolute: 0.6 10*3/uL — ABNORMAL HIGH (ref 0.0–0.5)
HEMATOCRIT: 39.7 % (ref 38.4–49.9)
HGB: 12.3 g/dL — ABNORMAL LOW (ref 13.0–17.1)
LYMPH#: 0.7 10*3/uL — AB (ref 0.9–3.3)
LYMPH%: 6.1 % — AB (ref 14.0–49.0)
MCH: 25.4 pg — AB (ref 27.2–33.4)
MCHC: 31.1 g/dL — AB (ref 32.0–36.0)
MCV: 81.8 fL (ref 79.3–98.0)
MONO#: 1 10*3/uL — ABNORMAL HIGH (ref 0.1–0.9)
MONO%: 8.9 % (ref 0.0–14.0)
NEUT#: 8.6 10*3/uL — ABNORMAL HIGH (ref 1.5–6.5)
NEUT%: 79 % — ABNORMAL HIGH (ref 39.0–75.0)
Platelets: 265 10*3/uL (ref 140–400)
RBC: 4.86 10*6/uL (ref 4.20–5.82)
RDW: 16.5 % — AB (ref 11.0–14.6)
WBC: 10.9 10*3/uL — AB (ref 4.0–10.3)

## 2014-12-29 LAB — CEA: CEA: 134.8 ng/mL — ABNORMAL HIGH (ref 0.0–5.0)

## 2014-12-29 LAB — LACTATE DEHYDROGENASE (CC13): LDH: 222 U/L (ref 125–245)

## 2014-12-29 LAB — FERRITIN CHCC: Ferritin: 252 ng/ml (ref 22–316)

## 2014-12-29 MED ORDER — SODIUM CHLORIDE 0.9 % IJ SOLN
10.0000 mL | INTRAMUSCULAR | Status: DC | PRN
  Administered 2014-12-29: 10 mL via INTRAVENOUS
  Filled 2014-12-29: qty 10

## 2014-12-29 MED ORDER — HEPARIN SOD (PORK) LOCK FLUSH 100 UNIT/ML IV SOLN
500.0000 [IU] | Freq: Once | INTRAVENOUS | Status: AC
  Administered 2014-12-29: 500 [IU] via INTRAVENOUS
  Filled 2014-12-29: qty 5

## 2014-12-29 MED ORDER — OXYCODONE-ACETAMINOPHEN 5-325 MG PO TABS: 1.0000 | ORAL_TABLET | Freq: Four times a day (QID) | ORAL | Status: DC | PRN

## 2014-12-29 NOTE — Progress Notes (Addendum)
[place] OFFICE PROGRESS NOTE   Diagnosis:  Colon cancer  INTERVAL HISTORY:   [name] returns as scheduled. He reports increased abdominal pain. He is taking hydrocodone 3-4 times a day, 1-2 tablets. He would like something stronger. He notes increased "gas and burping". He vomits periodically. He has been constipated. No bloody or black stools. Skin rash is better.  Objective:  Vital signs in last 24 hours:  Blood pressure 106/56, pulse 91, temperature 97.4 F (36.3 C), temperature source Oral, resp. rate 18, height 5' 7"  (1.702 m), weight 150 lb 12.8 oz (68.402 kg), SpO2 94 %.    HEENT: No thrush or ulcers. Resp: Lungs clear bilaterally. Cardio: Regular rate and rhythm. GI: Abdomen is soft. No hepatomegaly. Palpable mass with associated tenderness just superior to the umbilicus. Vascular: No leg edema.  Port-A-Cath without erythema.   Lab Results:  Lab Results  Component Value Date   WBC 10.9* 12/29/2014   HGB 12.3* 12/29/2014   HCT 39.7 12/29/2014   MCV 81.8 12/29/2014   PLT 265 12/29/2014   NEUTROABS 8.6* 12/29/2014    Imaging:  No results found.  Medications: I have reviewed the patient's current medications.  Assessment/Plan: 1. 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  Initially diagnosed with stage III colon cancer July 2012 status post left colectomy 07/09/2011. Microsatellite stable   K-ras mutation not detected on initial testing. Extended testing returned negative for the K-ras mutation.   Adjuvant Xeloda initiated July 2012.   Rise in the CEA tumor marker and 2 intra-abdominal soft tissue densities on CT scan November 2012 while on adjuvant Xeloda chemotherapy.   Oxaliplatin and Avastin were added to the regimen beginning 11/26/2011 with a decrease in the CEA and continued through 04/23/2012. Oxaliplatin discontinued at that time due to cumulative neurotoxicity.   Xeloda and Avastin continued.   Restaging  CT evaluation 04/09/2013 showed an increase in the previously noted peritoneal implants.   FOLFIRI initiated 04/29/2013. Irinotecan dose reduced following cycle 1 due to mucositis.   Avastin added beginning with cycle 3.   Restaging CT evaluation 07/27/2013 (after 6 cycles) showed interval resolution of a previously described left lower lobe nodule. Interval improvement in peritoneal nodularity. He completed a total of 14 cycles through 11/23/2013. Treatment was adjusted to [facility] following an office visit 12/07/2013 due to a significant decline in his performance status.   Avastin placed on hold beginning 03/31/2014 due to increased urine protein. 24-hour urine on 04/02/2014 showed 814 mg of protein.   03/31/2014 CEA mildly increased at 20.3 as compared to 15.6 on 03/03/2014 and 10.4 on 02/03/2014.   Xeloda placed on hold beginning 04/14/2014 pending upcoming CT scans.   CT scan chest/abdomen/pelvis 04/29/2014 showed an enlarging stomach mass involving the anterior wall in the antral region measuring approximately 5 cm. The mass was narrowing the gastric lumen. Peritoneal implants in the left upper quadrant were noted to be slightly more enlarged.   Upper endoscopy 05/04/2014 with findings of a 5 x 5 cm mass in the gastric antrum. Biopsy showed metastatic adenocarcinoma.   Initiation of gastric radiation and concurrent Xeloda 05/25/2014; completed 06/14/2014.   Restaging CT scans chest/abdomen/pelvis 07/27/2014 with mild increase in the size of several small abdominal peritoneal metastases and mild decrease in size of the soft tissue mass involving the anterior wall of the gastric antrum. No new sites of metastatic disease identified. No evidence of metastatic disease involving the chest. Moderate to severe emphysema.   Cycle 1 irinotecan/panitumumab 08/04/2014  Cycle 2 irinotecan/Panitumumab 08/18/2014.   Cycle 3 irinotecan/panitumumab 09/01/2014.   CEA improved  09/15/2014.   Treatment held 09/15/2014 due to diarrhea.   Cycle 4 irinotecan/Panitumumab 09/22/2014 (irinotecan dose reduced due to diarrhea).  Cycle 5 irinotecan/Panitumumab 10/06/2014.  Cycle 6 irinotecan/panitumumab 10/27/2014  Restaging CT 11/09/2014 with enlargement of a liver metastasis, increased gastric antral thickening, and slight progression of peritoneal metastases  CEA increased 11/09/2014 2. Status post Port-A-Cath placement 04/27/2013. 3. Status post resection of a stage I non-small cell lung cancer left upper lung with postop brachytherapy June 2007. 4. 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 Radiographic complete regression on Avastin based chemotherapy. 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 6. Type 2 diabetes. 7. History of nephrolithiasis. 8. Status post bilateral corneal transplants. 9. Increased urine protein on Avastin. 10. Intermittent upper abdominal pain secondary to gastric mass.  11. Delayed nausea and diarrhea following cycle 1 irinotecan/panitumumab-the anti-emetic premedication was adjusted with cycle 2. Nausea improved with cycle 2. Persistent diarrhea. 12. Rash secondary to Panitumumab. He continues minocycline.  13. Weight loss. 14. Diarrhea most likely related to irinotecan. Irinotecan dose reduced beginning with cycle 4 09/22/2014. 15. Right foot drop-potentially related to spinal stenosis versus peripheral nerve compression in the setting of weight loss. He is scheduled to undergo nerve decompression.   Disposition: [name] has progressive metastatic colon cancer. He has seen Dr. Reynaldo Minium and is considering enrollment on a clinical trial pending result of molecular testing for MET amplification. If negative Dr. Reynaldo Minium recommends FOLFOX/Avastin. We will contact Dr. Margarette Canada  office for the result. We will tentatively schedule [name] to begin FOLFOX/Avastin in one week and cancel if he is found to be a candidate for the clinical trial.  He is having increased abdominal pain associated with a palpable mass superior to the umbilicus. He was given a prescription for Percocet 1-2 tablets every 6 hours as needed. He will contact the office if this is not effective.  He is experiencing intermittent vomiting and "gas" discomfort after eating. He also has a new mild microcytic anemia. We discussed the possibility of impending gastric obstruction as well as the possibility of bleeding from the gastric mass. We recommended a soft diet and have made a referral to Dr. Hilarie Fredrickson.  [name] will return for a follow-up visit in one week with plans to proceed with FOLFOX/Avastin that day if MET amplification is negative. He will contact the office in the interim with any problems.  Patient seen with Dr. Benay Spice. 30 minutes were spent face-to-face at today's visit with the majority of that time involved in counseling/coordination of care.  Ned Card ANP/GNP-BC   12/29/2014  9:40 AM  This was a shared visit with Ned Card.  We will contact Dr. Reynaldo Minium to see if he qualifies for the clinical trial. If not, the plan is to proceed with FOLFOX/Avastin beginning next week. We reviewed the potential toxicities associated with FOLFOX including the chance for neuropathy.  We will refer him to Dr. Hilarie Fredrickson to consider an upper endoscopy to look for evidence of gastric outlet obstruction. He may be a candidate for a palliative stent  Julieanne Manson, M.D.

## 2014-12-29 NOTE — Patient Instructions (Signed)

## 2014-12-29 NOTE — Telephone Encounter (Signed)
gv adn printed appt sched and avs fo rpt for Jan 2016.....sed added tx.

## 2014-12-30 ENCOUNTER — Ambulatory Visit (INDEPENDENT_AMBULATORY_CARE_PROVIDER_SITE_OTHER): Payer: Medicare Other | Admitting: Physician Assistant

## 2014-12-30 ENCOUNTER — Encounter: Payer: Self-pay | Admitting: Physician Assistant

## 2014-12-30 VITALS — BP 110/72 | HR 92 | Ht 67.0 in | Wt 149.2 lb

## 2014-12-30 DIAGNOSIS — K319 Disease of stomach and duodenum, unspecified: Secondary | ICD-10-CM

## 2014-12-30 DIAGNOSIS — C189 Malignant neoplasm of colon, unspecified: Secondary | ICD-10-CM

## 2014-12-30 DIAGNOSIS — C799 Secondary malignant neoplasm of unspecified site: Secondary | ICD-10-CM

## 2014-12-30 DIAGNOSIS — K3189 Other diseases of stomach and duodenum: Secondary | ICD-10-CM

## 2014-12-30 NOTE — Patient Instructions (Addendum)
Arrive at [facility] on 12/31/14 at 1015 am for your procedure, do not have anything to eat or drink after midnight.

## 2014-12-30 NOTE — Progress Notes (Addendum)
Patient ID: [name], male   DOB: October 09, 1941, 74 y.o.   MRN: [id number]     History of Present Illness: [name] is a 74 year old male with a history of metastatic colon cancer who was recently seen by Dr.Sherrill with a mass at the gastric emptying status post radiation therapy. He was initially diagnosed with stage III colon cancer in July 2012 and is status post left colectomy 07/09/2011. Microsatellite stable. In November 2012 he was noted to have a rise in the CEA tumor marker and to intra-abdominal soft tissue densities on CT scan while on adjuvant Xeloda chemotherapy restaging CT evaluation and 04/09/2013 showed an increase in the previously noted peritoneal implants he had a CT of the chest/abdomen/pelvis in May 2015 that showed an enlarging stomach mass involving the anterior wall in the antral region measuring approximately 5 cm the mass was narrowing the gastric lumen peritoneal implants in the left upper quadrant were noted to be more enlarged than they had been. Upper endoscopy 05/04/2014 had findings of a 5 x 5 cm mass in the gastric antrum biopsy showed metastatic adenocarcinoma. He initiated treatment with gastric radiation and concurrent Xeloda on 05/25/2014 and completed this on 06/14/2014 restaging CT scans of the chest abdomen and pelvis on 07/27/2014 showed mild increase in the size of several small abdominal peritoneal metastasis and mild decrease in the size of the soft tissue mass involving the anterior wall of the gastric antrum no new sites of metastatic disease were identified there was no evidence of metastatic disease involving the chest he had a restaging CT on 11/09/2014 showed enlargement of the liver metastasis increased gastric antral thickening and slight progression of peritoneal metastasis patient is here today stating that since November he has had increasing symptoms of fullness, bloating, and nausea. He has vomited postprandially several times. He was advised to see  GI again to consider upper endoscopy to evaluate for evidence of gastric outlet obstruction as he may be a candidate for a palliative stent. He has been having some pain above the umbilicus with a new palpable mass.   Past Medical History  Diagnosis Date  . COPD (chronic obstructive pulmonary disease)   . Nephrolithiasis   . Diverticulosis   . Hx of adenomatous colonic polyps 04/30/07  . Glaucoma   . Hyperlipidemia   . ED (erectile dysfunction)   . Lung nodule   . Hepatic steatosis   . Thoracic spondylosis   . SOB (shortness of breath)   . Bruises easily   . Hearing loss     slight  . Contact lens/glasses fitting   . Rectal bleeding   . Hemorrhoids   . Glaucoma   . 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 Anemia   . History of radiation therapy 05/25/14-06/14/14    gastric mets 37.5Gy/  . Non-small cell lung cancer   . Colon cancer   . 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 Diabetes mellitus     type II    Past Surgical History  Procedure Laterality Date  . 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 Lung removal, partial  05/2006    Lt upper lobe wedge resection  . Tonsillectomy    . Kidney stone removal  07/2006  . Colonoscopy    . Polypectomy    . Colon surgery  06/2011  . 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 Trigger finger release  08/29/2012    Procedure: RELEASE TRIGGER FINGER/A-1 PULLEY;  Surgeon: Cammie Sickle., MD;  Location: [facility];  Service: Orthopedics;  Laterality: Left;  release a1 pulley left long  . Radioactive seed implantation Bilateral 05/28/2006    40seeds, lung left upper   . Superficial peroneal nerve release    . Mouth surgery     Family History  Problem Relation Age of Onset  . Asthma      aunt  . Asthma Cousin   . Emphysema Father   . Leukemia Father   . Lung cancer Father   . Ulcers Brother     of the stomach  . Esophageal cancer Mother   . Cancer Paternal Aunt     breast    . Cancer Daughter    History  Substance Use Topics  . Smoking status: Former Smoker -- 2.00 packs/day for 25 years    Types: Cigarettes    Quit date: 03/13/1981  . Smokeless tobacco: Never Used  . Alcohol Use: 0.6 oz/week    1 Cans of beer per week     Comment: occasional beer   Current Outpatient Prescriptions  Medication Sig Dispense Refill  . aspirin 81 MG tablet Take 81 mg by mouth daily.     . Cholecalciferol (VITAMIN D3) 2000 UNITS TABS Take 1 tablet by mouth daily.     . cyanocobalamin 500 MCG tablet Take 1,000 mcg by mouth daily.     . diphenoxylate-atropine (LOMOTIL) 2.5-0.025 MG per tablet Take 1 tablet by mouth 4 (four) times daily as needed for diarrhea or loose stools. Up to 8 per day 30 tablet 0  . dorzolamide-timolol (COSOPT) 22.3-6.8 MG/ML ophthalmic solution Place 1 drop into the left eye 2 (two) times daily.     Marland Kitchen HYDROcodone-acetaminophen (NORCO/VICODIN) 5-325 MG per tablet Take 1-2 tablets by mouth every 4 (four) hours as needed for moderate pain. 75 tablet 0  . ibuprofen (ADVIL,MOTRIN) 200 MG tablet Take 200 mg by mouth every 6 (six) hours as needed. Takes 2 tablets QOD    . loperamide (IMODIUM A-D) 2 MG tablet Take by mouth as needed for diarrhea or loose stools. Take 2 tablets at the first sign of diarrhea then take one every 2 hours. Take 2 tablets at bedtime. Stop when you have not had a loose stool in twelve hours.    Marland Kitchen loratadine (CLARITIN) 10 MG tablet Take 10 mg by mouth 2 (two) times daily.      Marland Kitchen LORazepam (ATIVAN) 0.5 MG tablet Take one to two at bedtime for sleep as needed 60 tablet 1  . loteprednol (LOTEMAX) 0.5 % ophthalmic suspension Place 1 drop into the left eye daily.     . ondansetron (ZOFRAN) 8 MG tablet Take 1 tablet (8 mg total) by mouth every 12 (twelve) hours as needed for nausea. 30 tablet 1  . oxyCODONE-acetaminophen (PERCOCET/ROXICET) 5-325 MG per tablet Take 1-2 tablets by mouth every 6 (six) hours as needed for severe pain. 60 tablet 0  .  pantoprazole (PROTONIX) 40 MG tablet TAKE 1 TABLET ONCE DAILY. 30 tablet 3  . prochlorperazine (COMPAZINE) 10 MG tablet Take 1 tablet (10 mg total) by mouth every 6 (six) hours as needed for nausea or vomiting. 30 tablet 1  . testosterone enanthate (DELATESTRYL) 200 MG/ML injection Inject 500 mg into the muscle every 21 ( twenty-one) days. Every 3 weeks currently     No current facility-administered medications for this visit.   No Known Allergies  Review of Systems: Gen: Denies any fever, chills, sweats CV: Denies chest pain, angina, palpitations, syncope, orthopnea, PND, peripheral edema, and claudication. Resp: Denies dyspnea at rest, dyspnea with exercise, cough, sputum, wheezing, coughing up blood, and pleurisy. GI: Denies vomiting blood, jaundice, and fecal incontinence.   Denies dysphagia or odynophagia. GU : Denies urinary burning, blood in urine, urinary frequency, urinary hesitancy, nocturnal urination, and urinary incontinence. MS: Denies joint pain, limitation of movement, and swelling, stiffness, low back pain, extremity pain. Denies muscle weakness, cramps, atrophy.  Derm: Denies rash, itching, dry skin, hives, moles, warts, or unhealing ulcers.  Psych: Denies depression, anxiety, memory loss, suicidal ideation, hallucinations, paranoia, and confusion. Heme: Denies bruising, bleeding, and enlarged lymph nodes. Neuro:  Denies any headaches, dizziness, paresthesia Endo:  Denies any problems with DM, thyroid, adrenal  LAB RESULTS:  Recent Labs  12/29/14 0905  WBC 10.9*  HGB 12.3*  HCT 39.7  PLT 265   BMET  Recent Labs  12/29/14 0906  NA 139  K 4.1  CO2 26  GLUCOSE 158*  BUN 13.5  CREATININE 0.9  CALCIUM 9.7   LFT  Recent Labs  12/29/14 0906  PROT 5.8*  ALBUMIN 2.9*  AST 12  ALT 10  ALKPHOS 97  BILITOT 0.36    Studies:   Abdomen Pelvis W Contrast   Status: Final result       PACS Images     Show images for CT Abdomen Pelvis W Contrast       Study Result     CLINICAL DATA: Colon cancer diagnosed 2012. Chemotherapy in progress. Lung cancer diagnosed 2007.  EXAM: CT CHEST, ABDOMEN, AND PELVIS WITH CONTRAST  TECHNIQUE: Multidetector CT imaging of the chest, abdomen and pelvis was performed following the standard protocol during bolus administration of intravenous contrast.  CONTRAST: 191m OMNIPAQUE IOHEXOL 300 MG/ML SOLN  COMPARISON: CT 07/27/2014  FINDINGS: CT CHEST FINDINGS  No axillary supraclavicular lymphadenopathy. Port in the right chest wall. No mediastinal or hilar lymphadenopathy. No pericardial fluid. No central pulmonary embolism.  There is severe centrilobular emphysema. There is post surgical scarring and volume loss in the left upper lobe. No new nodularity. There is reticular pattern at the lung bases consists with interstitial lung disease.  CT ABDOMEN AND PELVIS FINDINGS  Hepatobiliary: Interval increase in size of hepatic metastasis along the inferior margin of the right hepatic lobe with lesion measuring 22 mm on image 71 increased from 13 mm on prior. No additional hepatic lesions. There is a peritoneal implant adjacent to this hepatic metastasis which similar in size at 13 mm.  Pancreas: The pancreas is grossly normal. There is adenopathy superior to the pancreas which is confluent with thickening of the gastric antrum (image 59, series 2).  Spleen: Normal spleen  Adrenals/Urinary Tract: Adrenal glands are normal. The kidneys, ureters, and bladder normal.  Stomach/Bowel: Thickened the gastric antrum which extends to the pancreatic head along the second portion duodenum. This gastric thickening is slightly increased. No bowel obstruction. Small bowel is normal. The appendix is normal. Colon rectosigmoid colon are normal.  Vascular/Lymphatic: Abdominal aorta is normal caliber. There is no retroperitoneal or periportal lymphadenopathy. No  pelvic lymphadenopathy.  Reproductive: Prostate gland is normal.  Other: Interval mild progression of peritoneal metastasis. Lesion along the ventral abdominal wall midline measures 21 mm (image 85, series 2) increased from 19 mm on prior. No lesion more superior in the right upper quadrant along the ventral peritoneal surface measures 18 mm increased from 14 mm on  prior remeasured (image 66, series 2). Lesion in the low left upper quadrant measures 19 x 28 mm compared to 17 x 30 mm on prior.  Musculoskeletal: No aggressive osseous lesion.  IMPRESSION: Chest Impression:  1. No evidence of thoracic metastasis. 2. Stable postoperative change in the left upper lobe. 3. Severe emphysema.  Abdomen / Pelvis Impression:  1. Progression of right hepatic lobe metastasis adjacent to a peritoneal implant. 2. Mild progression of peritoneal metastasis primarily on the ventral peritoneal surface. 3. Thickening of the gastric antrum extending along the second portion duodenum consistent with peritoneal serosal implants. Mild progression of this disease additionally.   Electronically Signed  By: Suzy Bouchard M.D.  On: 11/09/2014 17:58      Physical Exam: General: Pleasant, well developed male in no acute distress Head: Normocephalic and atraumatic Eyes:  sclerae anicteric, conjunctiva pink  Ears: Normal auditory acuity Lungs: Clear throughout to auscultation Heart: Regular rate and rhythm Abdomen: Soft, non distended,firm, 3x3 cm palpable mass above umbilicus that is moderately tender Musculoskeletal: Symmetrical with no gross deformities  Extremities: No edema  Neurological: Alert oriented x 4, grossly nonfocal Psychological:  Alert and cooperative. Normal mood and affect  Assessment and Recommendations: 74 year old male with metastatic colon cancer with an enlarging stomach mass involving the anterior wall in the antral region. Patient has been experiencing  increasing abdominal pain with vomiting, nausea, and bloating postprandial. He has also been found to have a new microcytic anemia. Patient has been reviewed with Dr. Hilarie Fredrickson and is to be scheduled for an upper GI to evaluate the area of narrowing/obstruction. Based on results of this test, he will likely be scheduled for an EGD with stenting.  [name], Vita Barley PA-C 12/30/2014,  Addendum: Reviewed and agree with management.   Upper GI series showed obstruction, patient currently admitted be considered for palliative enteral stenting Jerene Bears, MD

## 2014-12-31 ENCOUNTER — Telehealth: Payer: Self-pay | Admitting: Physician Assistant

## 2014-12-31 ENCOUNTER — Inpatient Hospital Stay (HOSPITAL_COMMUNITY)
Admission: AD | Admit: 2014-12-31 | Discharge: 2015-01-07 | DRG: 374 | Disposition: A | Discharge status: Home / Self Care | Payer: Medicare Other | Source: Ambulatory Visit | Attending: Internal Medicine | Admitting: Internal Medicine

## 2014-12-31 ENCOUNTER — Encounter (HOSPITAL_COMMUNITY): Payer: Self-pay | Admitting: *Deleted

## 2014-12-31 ENCOUNTER — Ambulatory Visit (HOSPITAL_COMMUNITY)
Admission: RE | Admit: 2014-12-31 | Discharge: 2014-12-31 | Disposition: A | Discharge status: Home / Self Care | Payer: Medicare Other | Source: Ambulatory Visit | Attending: Physician Assistant | Admitting: Physician Assistant

## 2014-12-31 ENCOUNTER — Telehealth: Payer: Self-pay

## 2014-12-31 DIAGNOSIS — C7889 Secondary malignant neoplasm of other digestive organs: Secondary | ICD-10-CM | POA: Diagnosis present

## 2014-12-31 DIAGNOSIS — Z79899 Other long term (current) drug therapy: Secondary | ICD-10-CM | POA: Diagnosis not present

## 2014-12-31 DIAGNOSIS — Z87442 Personal history of urinary calculi: Secondary | ICD-10-CM

## 2014-12-31 DIAGNOSIS — Z923 Personal history of irradiation: Secondary | ICD-10-CM | POA: Diagnosis not present

## 2014-12-31 DIAGNOSIS — Z7982 Long term (current) use of aspirin: Secondary | ICD-10-CM | POA: Diagnosis not present

## 2014-12-31 DIAGNOSIS — K30 Functional dyspepsia: Secondary | ICD-10-CM | POA: Diagnosis not present

## 2014-12-31 DIAGNOSIS — N529 Male erectile dysfunction, unspecified: Secondary | ICD-10-CM | POA: Diagnosis present

## 2014-12-31 DIAGNOSIS — C189 Malignant neoplasm of colon, unspecified: Secondary | ICD-10-CM | POA: Diagnosis present

## 2014-12-31 DIAGNOSIS — H9190 Unspecified hearing loss, unspecified ear: Secondary | ICD-10-CM | POA: Diagnosis present

## 2014-12-31 DIAGNOSIS — E43 Unspecified severe protein-calorie malnutrition: Secondary | ICD-10-CM | POA: Diagnosis present

## 2014-12-31 DIAGNOSIS — Z9221 Personal history of antineoplastic chemotherapy: Secondary | ICD-10-CM

## 2014-12-31 DIAGNOSIS — C3490 Malignant neoplasm of unspecified part of unspecified bronchus or lung: Secondary | ICD-10-CM | POA: Diagnosis present

## 2014-12-31 DIAGNOSIS — Z87891 Personal history of nicotine dependence: Secondary | ICD-10-CM | POA: Diagnosis not present

## 2014-12-31 DIAGNOSIS — K3189 Other diseases of stomach and duodenum: Secondary | ICD-10-CM

## 2014-12-31 DIAGNOSIS — J439 Emphysema, unspecified: Secondary | ICD-10-CM | POA: Diagnosis present

## 2014-12-31 DIAGNOSIS — E785 Hyperlipidemia, unspecified: Secondary | ICD-10-CM | POA: Diagnosis present

## 2014-12-31 DIAGNOSIS — K210 Gastro-esophageal reflux disease with esophagitis: Secondary | ICD-10-CM | POA: Diagnosis present

## 2014-12-31 DIAGNOSIS — Z86010 Personal history of colonic polyps: Secondary | ICD-10-CM

## 2014-12-31 DIAGNOSIS — Z902 Acquired absence of lung [part of]: Secondary | ICD-10-CM | POA: Diagnosis present

## 2014-12-31 DIAGNOSIS — J438 Other emphysema: Secondary | ICD-10-CM

## 2014-12-31 DIAGNOSIS — E119 Type 2 diabetes mellitus without complications: Secondary | ICD-10-CM

## 2014-12-31 DIAGNOSIS — Z6823 Body mass index (BMI) 23.0-23.9, adult: Secondary | ICD-10-CM | POA: Diagnosis not present

## 2014-12-31 DIAGNOSIS — H409 Unspecified glaucoma: Secondary | ICD-10-CM | POA: Diagnosis present

## 2014-12-31 DIAGNOSIS — J449 Chronic obstructive pulmonary disease, unspecified: Secondary | ICD-10-CM | POA: Diagnosis present

## 2014-12-31 DIAGNOSIS — M47894 Other spondylosis, thoracic region: Secondary | ICD-10-CM | POA: Diagnosis present

## 2014-12-31 DIAGNOSIS — K209 Esophagitis, unspecified without bleeding: Secondary | ICD-10-CM | POA: Diagnosis present

## 2014-12-31 DIAGNOSIS — K311 Adult hypertrophic pyloric stenosis: Secondary | ICD-10-CM

## 2014-12-31 DIAGNOSIS — K760 Fatty (change of) liver, not elsewhere classified: Secondary | ICD-10-CM | POA: Diagnosis present

## 2014-12-31 DIAGNOSIS — C799 Secondary malignant neoplasm of unspecified site: Secondary | ICD-10-CM

## 2014-12-31 LAB — GLUCOSE, CAPILLARY
GLUCOSE-CAPILLARY: 124 mg/dL — AB (ref 70–99)
GLUCOSE-CAPILLARY: 122 mg/dL — AB (ref 70–99)

## 2014-12-31 MED ORDER — MORPHINE SULFATE 2 MG/ML IJ SOLN
2.0000 mg | INTRAMUSCULAR | Status: DC | PRN
  Administered 2014-12-31 – 2015-01-07 (×28): 2 mg via INTRAVENOUS
  Filled 2014-12-31 (×28): qty 1

## 2014-12-31 MED ORDER — ONDANSETRON HCL 4 MG/2ML IJ SOLN
4.0000 mg | Freq: Four times a day (QID) | INTRAMUSCULAR | Status: DC | PRN
  Administered 2015-01-01 – 2015-01-06 (×5): 4 mg via INTRAVENOUS
  Filled 2014-12-31 (×5): qty 2

## 2014-12-31 MED ORDER — ACETAMINOPHEN 650 MG RE SUPP
650.0000 mg | Freq: Four times a day (QID) | RECTAL | Status: DC | PRN
Start: 2014-12-31 — End: 2015-01-07

## 2014-12-31 MED ORDER — INSULIN ASPART 100 UNIT/ML ~~LOC~~ SOLN
0.0000 [IU] | SUBCUTANEOUS | Status: DC
  Administered 2014-12-31 – 2015-01-05 (×6): 2 [IU] via SUBCUTANEOUS
  Administered 2015-01-07: 3 [IU] via SUBCUTANEOUS

## 2014-12-31 MED ORDER — ONDANSETRON HCL 4 MG PO TABS: 4.0000 mg | ORAL_TABLET | Freq: Four times a day (QID) | ORAL | Status: DC | PRN

## 2014-12-31 MED ORDER — LORAZEPAM 2 MG/ML IJ SOLN
1.0000 mg | INTRAMUSCULAR | Status: DC | PRN
  Administered 2015-01-01: 1 mg via INTRAVENOUS
  Filled 2014-12-31: qty 1

## 2014-12-31 MED ORDER — DEXTROSE-NACL 5-0.9 % IV SOLN
INTRAVENOUS | Status: DC
  Administered 2014-12-31 – 2015-01-05 (×9): via INTRAVENOUS

## 2014-12-31 MED ORDER — HEPARIN SODIUM (PORCINE) 5000 UNIT/ML IJ SOLN
5000.0000 [IU] | Freq: Three times a day (TID) | INTRAMUSCULAR | Status: DC
  Administered 2014-12-31 – 2015-01-03 (×8): 5000 [IU] via SUBCUTANEOUS
  Filled 2014-12-31 (×11): qty 1

## 2014-12-31 MED ORDER — ACETAMINOPHEN 325 MG PO TABS
650.0000 mg | ORAL_TABLET | Freq: Four times a day (QID) | ORAL | Status: DC | PRN
Start: 2014-12-31 — End: 2015-01-07

## 2014-12-31 MED ORDER — DORZOLAMIDE HCL-TIMOLOL MAL 2-0.5 % OP SOLN
1.0000 [drp] | Freq: Two times a day (BID) | OPHTHALMIC | Status: DC
  Administered 2014-12-31 – 2015-01-07 (×14): 1 [drp] via OPHTHALMIC
  Filled 2014-12-31: qty 10

## 2014-12-31 MED ORDER — LOTEPREDNOL ETABONATE 0.5 % OP SUSP
1.0000 [drp] | OPHTHALMIC | Status: DC
  Administered 2015-01-02 – 2015-01-04 (×2): 1 [drp] via OPHTHALMIC
  Filled 2014-12-31: qty 5

## 2014-12-31 NOTE — H&P (Signed)
[facility] History and Physical  [name] WRU:[id number] DOB: [birth year] DOA: 12/31/2014  Referring physician: Arta Bruce, PA-C PCP: Jerlyn Ly, MD  Specialists:   Chief Complaint: Abd fullness/pain  HPI: [name] is a 74 y.o. male  With a hx of copd, metastatic colon and gastric cancer followed by Dr. Benay Spice who presents from clinic with gradually increasing abd distension and abd pain relieved by belching. Pt underwent UTI series on the morning of admission and was found to have near complete gastric outlet obstruction. Gastroenterology was notified with recommendations for admission to the hospitalist service for NG placement and ultimately likely EGD and gastric outlet stent placement.  When seen, pt denies abd currently. Does report flatus and bowel movements.   Review of Systems:   Per above, remainder of the 10pt ros reviewed and are neg  Past Medical History  Diagnosis Date  . COPD (chronic obstructive pulmonary disease)   . Nephrolithiasis   . Diverticulosis   . Hx of adenomatous colonic polyps 04/30/07  . Glaucoma   . Hyperlipidemia   . ED (erectile dysfunction)   . Lung nodule   . Hepatic steatosis   . Thoracic spondylosis   . SOB (shortness of breath)   . Bruises easily   . Hearing loss     slight  . Contact lens/glasses fitting   . Rectal bleeding   . Hemorrhoids   . Glaucoma   . 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 Anemia   . History of radiation therapy 05/25/14-06/14/14    gastric mets 37.5Gy/  . Non-small cell lung cancer   . Colon cancer   . 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 Diabetes mellitus     type II   Past Surgical History  Procedure Laterality Date  . 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 Lung removal, partial  05/2006    Lt upper lobe wedge resection  . Tonsillectomy    . Kidney stone removal  07/2006  . Colonoscopy    . Polypectomy    . Colon surgery  06/2011  . Corneal  transplant  7-8 yrs ago    X 3; Lt eye  . Trigger finger release  08/29/2012    Procedure: RELEASE TRIGGER FINGER/A-1 PULLEY;  Surgeon: Cammie Sickle., MD;  Location: [facility];  Service: Orthopedics;  Laterality: Left;  release a1 pulley left long  . Radioactive seed implantation Bilateral 05/28/2006    40seeds, lung left upper   . Superficial peroneal nerve release    . Mouth surgery     Social History:  reports that he quit smoking about 33 years ago. His smoking use included Cigarettes. He has a 50 pack-year smoking history. He has never used smokeless tobacco. He reports that he drinks about 0.6 oz of alcohol per week. He reports that he does not use illicit drugs.  where does patient live--home, ALF, SNF? and with whom if at home?  Can patient participate in ADLs?  No Known Allergies  Family History  Problem Relation Age of Onset  . Asthma      aunt  . Asthma Cousin   . Emphysema Father   . Leukemia Father   . Lung cancer Father   . Ulcers Brother     of the stomach  . Esophageal cancer Mother   . Cancer Paternal Aunt     breast  . Cancer Daughter     (  be sure to complete)  Prior to Admission medications   Medication Sig Start Date End Date Taking? Authorizing Provider  aspirin 81 MG tablet Take 81 mg by mouth daily.     Historical Provider, MD  Cholecalciferol (VITAMIN D3) 2000 UNITS TABS Take 1 tablet by mouth daily.     Historical Provider, MD  cyanocobalamin 500 MCG tablet Take 1,000 mcg by mouth daily.     Historical Provider, MD  diphenoxylate-atropine (LOMOTIL) 2.5-0.025 MG per tablet Take 1 tablet by mouth 4 (four) times daily as needed for diarrhea or loose stools. Up to 8 per day 10/27/14   Owens Shark, NP  dorzolamide-timolol (COSOPT) 22.3-6.8 MG/ML ophthalmic solution Place 1 drop into the left eye 2 (two) times daily.  07/15/13   Historical Provider, MD  HYDROcodone-acetaminophen (NORCO/VICODIN) 5-325 MG per tablet Take 1-2 tablets by mouth  every 4 (four) hours as needed for moderate pain. 12/01/14   Owens Shark, NP  ibuprofen (ADVIL,MOTRIN) 200 MG tablet Take 200 mg by mouth every 6 (six) hours as needed. Takes 2 tablets QOD    Historical Provider, MD  loperamide (IMODIUM A-D) 2 MG tablet Take by mouth as needed for diarrhea or loose stools. Take 2 tablets at the first sign of diarrhea then take one every 2 hours. Take 2 tablets at bedtime. Stop when you have not had a loose stool in twelve hours.    Historical Provider, MD  loratadine (CLARITIN) 10 MG tablet Take 10 mg by mouth 2 (two) times daily.      Historical Provider, MD  LORazepam (ATIVAN) 0.5 MG tablet Take one to two at bedtime for sleep as needed 05/07/14   Owens Shark, NP  loteprednol (LOTEMAX) 0.5 % ophthalmic suspension Place 1 drop into the left eye daily.     Historical Provider, MD  ondansetron (ZOFRAN) 8 MG tablet Take 1 tablet (8 mg total) by mouth every 12 (twelve) hours as needed for nausea. 08/18/14   Ladell Pier, MD  oxyCODONE-acetaminophen (PERCOCET/ROXICET) 5-325 MG per tablet Take 1-2 tablets by mouth every 6 (six) hours as needed for severe pain. 12/29/14   Owens Shark, NP  pantoprazole (PROTONIX) 40 MG tablet TAKE 1 TABLET ONCE DAILY. 08/11/14   Jerene Bears, MD  prochlorperazine (COMPAZINE) 10 MG tablet Take 1 tablet (10 mg total) by mouth every 6 (six) hours as needed for nausea or vomiting. 08/18/14   Ladell Pier, MD  testosterone enanthate (DELATESTRYL) 200 MG/ML injection Inject 500 mg into the muscle every 21 ( twenty-one) days. Every 3 weeks currently    Historical Provider, MD   Physical Exam: There were no vitals filed for this visit.   General:  Awake, in nad  Eyes: PERRL B  ENT: membranes moist, dentition fair  Neck: trachea midline, neck supple  Cardiovascular: regular, s1, s2  Respiratory: normal resp effort, no wheezing  Abdomen: soft,nondistended, pos BS  Skin: normal skin turgor, no abnormal skin lesions  seen  Musculoskeletal: perfused, no clubbing  Psychiatric: mood/affect normal// no auditory/visual hallucinations  Neurologic: cn2-12 grossly intact, strength/sensation intact  Labs on Admission:  Basic Metabolic Panel:  Recent Labs Lab 12/29/14 0906  NA 139  K 4.1  CO2 26  GLUCOSE 158*  BUN 13.5  CREATININE 0.9  CALCIUM 9.7   Liver Function Tests:  Recent Labs Lab 12/29/14 0906  AST 12  ALT 10  ALKPHOS 97  BILITOT 0.36  PROT 5.8*  ALBUMIN 2.9*   No results for  input(s): LIPASE, AMYLASE in the last 168 hours. No results for input(s): AMMONIA in the last 168 hours. CBC:  Recent Labs Lab 12/29/14 0905  WBC 10.9*  NEUTROABS 8.6*  HGB 12.3*  HCT 39.7  MCV 81.8  PLT 265   Cardiac Enzymes: No results for input(s): CKTOTAL, CKMB, CKMBINDEX, TROPONINI in the last 168 hours.  BNP (last 3 results) No results for input(s): PROBNP in the last 8760 hours. CBG: No results for input(s): GLUCAP in the last 168 hours.  Radiological Exams on Admission: Dg Ugi W/high Density W/kub  12/31/2014   ADDENDUM REPORT: 12/31/2014 11:57  ADDENDUM: Study discussed by telephone with PA LORI HVOZDOVIC on 12/31/2014 at 11:53 hrs.  Her office will contact the patient today and arrange appropriate followup.   Electronically Signed   By: Lars Pinks M.D.   On: 12/31/2014 11:57   12/31/2014   CLINICAL DATA:  74 year old male with distal gastric mass undergoing pretreatment evaluation. Current history of colon cancer diagnosed in 2012 and lung cancer diagnosed in 2007. Subsequent encounter.  EXAM: UPPER GI SERIES WITH KUB  TECHNIQUE: After obtaining a scout radiograph a routine upper GI series was performed using effervescent crystals and high density barium.  FLUOROSCOPY TIME:  1 min and 30 seconds  COMPARISON:  CT Abdomen and Pelvis 11/09/2014.  FINDINGS: Preprocedural scout view of the abdomen demonstrating a non obstructed bowel gas pattern. Degenerative changes in the spine. No acute osseous  abnormality identified.  A double contrast study was undertaken and the patient tolerated this well and without difficulty.  No obstruction to the forward flow of contrast throughout the esophagus and into the stomach. Normal esophageal course and contour. Normal esophageal mucosal pattern.  Normal gastroesophageal junction.  At this point abundant retained gastric contents became apparent. Subsequently there is suboptimal gastric coating with contrast. Furthermore, the proximal 2/3 of the stomach are dilated, and despite observation for an additional 22 minutes, only trace contrast transit distally occurred.  The trace distal contrast out lines and irregular filling defect in the distal stomach corresponding to the soft tissue mass demonstrated by CT Abdomen and Pelvis in November. See series 10. Only a narrow patent lumen at the gastric antrum remains currently.  The pylorus is patent. Minimal duodenum all contrast. The visible duodenum mucosa appears within normal limits.  IMPRESSION: 1. Bulky and irregular soft tissue mass in the distal stomach, corresponding to that seen on the November CT Abdomen and Pelvis, now causing near complete gastric outlet obstruction. Subsequently abundant retained gastric contents were encounter did in the more proximal stomach limiting its evaluation. 2. No esophageal abnormality. Minimal visualization of the duodenum.  Electronically Signed: By: Lars Pinks M.D. On: 12/31/2014 11:45    Assessment/Plan Active Problems:   COPD with emphysema   Colon adenocarcinoma   Diabetes mellitus   Metastasis from malignant tumor of colon   Gastric outlet obstruction   1. 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 GI is aware of pt with tentative plans for possible EGD with stent placement 2. NGT to be placed on LWS 3. Will cont with basal IVF 4. Pt has been directly admitted to med-surg 2. COPD 1. Stable 2. No wheezing 3. On min O2 support 3. DM2 1. Will  cont on SSI coverage 4. Metastatic gastric/colon cancer 1. Followed by Dr. Benay Spice 5. DVT prophylaxis 1. Heparin subQ  Code Status: Full (must indicate code status--if unknown or must be presumed, indicate so) Family Communication: Pt in room, family at  bedside (indicate person spoken with, if applicable, with phone number if by telephone) Disposition Plan: Admitted to med-surg (indicate anticipated LOS)  Time spent: 79min  [name], [facility] Pager [phone]  If 7PM-7AM, please contact night-coverage [email or web address] Password [facility] 12/31/2014, 5:52 PM

## 2014-12-31 NOTE — Plan of Care (Signed)
Problem: Consults Goal: General Medical Patient Education See Patient Education Module for specific education. Outcome: Progressing Pt instructed on medical procedures such as NG tube placement, checking blood sugars and IV fluids. Pt voiced understanding of each one.     Goal: Nutrition Consult-if indicated Outcome: Progressing Pt is NPO for now, has NG tube and taking few ice chips.

## 2014-12-31 NOTE — Telephone Encounter (Signed)
REc call from radiology. Pt UGI with significant dilation of proximal stomach with retained contents. Pt reviewed with Dr Carlean Purl as Dr Hilarie Fredrickson is off. Pt to be admitted as direct admit to hospitalist service, to have NGT to decompress stomach over weekend. Will need GI eval abd EGD with possible stenting next week. Dr Carlean Purl has spoken to Dr Benay Spice who is in agreement.

## 2014-12-31 NOTE — Telephone Encounter (Signed)
Dr. Strickler called from [facility], pt was negative for MET amplification, he called and informed the pt who is in the hospital right now for bowel obstruction. And states he will have the results faxed over. If any questions his pager is [phone]. Informed Lisa Thomas, NP and Dr. Sherrill.  

## 2015-01-01 DIAGNOSIS — C799 Secondary malignant neoplasm of unspecified site: Secondary | ICD-10-CM

## 2015-01-01 DIAGNOSIS — C189 Malignant neoplasm of colon, unspecified: Secondary | ICD-10-CM

## 2015-01-01 DIAGNOSIS — K311 Adult hypertrophic pyloric stenosis: Secondary | ICD-10-CM

## 2015-01-01 LAB — GLUCOSE, CAPILLARY
GLUCOSE-CAPILLARY: 104 mg/dL — AB (ref 70–99)
GLUCOSE-CAPILLARY: 117 mg/dL — AB (ref 70–99)
GLUCOSE-CAPILLARY: 124 mg/dL — AB (ref 70–99)
Glucose-Capillary: 117 mg/dL — ABNORMAL HIGH (ref 70–99)
Glucose-Capillary: 95 mg/dL (ref 70–99)
Glucose-Capillary: 107 mg/dL — ABNORMAL HIGH (ref 70–99)

## 2015-01-01 LAB — BASIC METABOLIC PANEL
Anion gap: 7 (ref 5–15)
BUN: 13 mg/dL (ref 6–23)
CO2: 28 mmol/L (ref 19–32)
Calcium: 9.6 mg/dL (ref 8.4–10.5)
Chloride: 107 mEq/L (ref 96–112)
Creatinine, Ser: 0.9 mg/dL (ref 0.50–1.35)
GFR calc Af Amer: >90 mL/min (ref >90)
GFR calc non Af Amer: 82 mL/min — ABNORMAL LOW (ref >90)
Glucose, Bld: 127 mg/dL — ABNORMAL HIGH (ref 70–99)
Potassium: 3.7 mmol/L (ref 3.5–5.1)
Sodium: 142 mmol/L (ref 135–145)

## 2015-01-01 LAB — CBC
HCT: 39 % (ref 39.0–52.0)
Hemoglobin: 11.9 g/dL — ABNORMAL LOW (ref 13.0–17.0)
MCH: 25.6 pg — ABNORMAL LOW (ref 26.0–34.0)
MCHC: 30.5 g/dL (ref 30.0–36.0)
MCV: 83.9 fL (ref 78.0–100.0)
Platelets: 242 10*3/uL (ref 150–400)
RBC: 4.65 MIL/uL (ref 4.22–5.81)
RDW: 15.8 % — ABNORMAL HIGH (ref 11.5–15.5)
WBC: 10.6 10*3/uL — AB (ref 4.0–10.5)

## 2015-01-01 NOTE — Progress Notes (Signed)
[facility] PROGRESS NOTE  [name] HTD:[id number] DOB: [birth year] DOA: 12/31/2014 PCP: Jerlyn Ly, MD  Assessment/Plan: 1. Gastric outlet obstruction 1. Pt notes improvement with NG to LWS 2. GI following. Appreciate input. 3. Plans for EGD and possible stent placement early next week 4. Cont with supportive care 2. COPD 1. Remains stable 2. No active wheezing at this time 3. On min O2 support 3. DM2 1. Pt is continued on SSI coverage 4. Metastatic gastric/colon cancer 1. Pt is followed by Dr. Benay Spice as an outpt 5. DVT prophylaxis 1. Heparin subQ  Code Status: Full Family Communication: Pt in room, family at bedside Disposition Plan: Pending   Consultants:  GI  Procedures:    Antibiotics:   (indicate start date, and stop date if known)  HPI/Subjective: Feels better with NG tube in place  Objective: Filed Vitals:   12/31/14 1740 12/31/14 2115 01/01/15 0419 01/01/15 1348  BP: 131/63 127/65 133/61 133/63  Pulse: 100 92 98 88  Temp: 98.1 F (36.7 C) 97.9 F (36.6 C) 98.1 F (36.7 C) 98.2 F (36.8 C)  TempSrc: Oral Oral Oral Oral  Resp: 18 18 16 16   Height: 5\' 7"  (1.702 m)     Weight: 67.586 kg (149 lb)     SpO2:  94% 98% 98%    Intake/Output Summary (Last 24 hours) at 01/01/15 1716 Last data filed at 01/01/15 1200  Gross per 24 hour  Intake      0 ml  Output    875 ml  Net   -875 ml   Filed Weights   12/31/14 1740  Weight: 67.586 kg (149 lb)    Exam:   General:  Awake, in nad, NG in place  Cardiovascular: regular, s1, s2  Respiratory: normal resp effort, no wheezing  Abdomen: soft, pos BS  Musculoskeletal: perfused, no clubbing   Data Reviewed: Basic Metabolic Panel:  Recent Labs Lab 12/29/14 0906 01/01/15 0535  NA 139 142  K 4.1 3.7  CL  --  107  CO2 26 28  GLUCOSE 158* 127*  BUN 13.5 13  CREATININE 0.9 0.90  CALCIUM 9.7 9.6   Liver Function Tests:  Recent Labs Lab 12/29/14 0906  AST 12  ALT 10   ALKPHOS 97  BILITOT 0.36  PROT 5.8*  ALBUMIN 2.9*   No results for input(s): LIPASE, AMYLASE in the last 168 hours. No results for input(s): AMMONIA in the last 168 hours. CBC:  Recent Labs Lab 12/29/14 0905 01/01/15 0535  WBC 10.9* 10.6*  NEUTROABS 8.6*  --   HGB 12.3* 11.9*  HCT 39.7 39.0  MCV 81.8 83.9  PLT 265 242   Cardiac Enzymes: No results for input(s): CKTOTAL, CKMB, CKMBINDEX, TROPONINI in the last 168 hours. BNP (last 3 results) No results for input(s): PROBNP in the last 8760 hours. CBG:  Recent Labs Lab 01/01/15 0028 01/01/15 0413 01/01/15 0717 01/01/15 1136 01/01/15 1636  GLUCAP 104* 117* 117* 124* 95    No results found for this or any previous visit (from the past 240 hour(s)).   Studies: Dg Ugi W/high Density W/kub  12/31/2014   ADDENDUM REPORT: 12/31/2014 11:57  ADDENDUM: Study discussed by telephone with PA LORI HVOZDOVIC on 12/31/2014 at 11:53 hrs.  Her office will contact the patient today and arrange appropriate followup.   Electronically Signed   By: Lars Pinks M.D.   On: 12/31/2014 11:57   12/31/2014   CLINICAL DATA:  74 year old male with distal gastric mass undergoing pretreatment evaluation.  Current history of colon cancer diagnosed in 2012 and lung cancer diagnosed in 2007. Subsequent encounter.  EXAM: UPPER GI SERIES WITH KUB  TECHNIQUE: After obtaining a scout radiograph a routine upper GI series was performed using effervescent crystals and high density barium.  FLUOROSCOPY TIME:  1 min and 30 seconds  COMPARISON:  CT Abdomen and Pelvis 11/09/2014.  FINDINGS: Preprocedural scout view of the abdomen demonstrating a non obstructed bowel gas pattern. Degenerative changes in the spine. No acute osseous abnormality identified.  A double contrast study was undertaken and the patient tolerated this well and without difficulty.  No obstruction to the forward flow of contrast throughout the esophagus and into the stomach. Normal esophageal course and  contour. Normal esophageal mucosal pattern.  Normal gastroesophageal junction.  At this point abundant retained gastric contents became apparent. Subsequently there is suboptimal gastric coating with contrast. Furthermore, the proximal 2/3 of the stomach are dilated, and despite observation for an additional 22 minutes, only trace contrast transit distally occurred.  The trace distal contrast out lines and irregular filling defect in the distal stomach corresponding to the soft tissue mass demonstrated by CT Abdomen and Pelvis in November. See series 10. Only a narrow patent lumen at the gastric antrum remains currently.  The pylorus is patent. Minimal duodenum all contrast. The visible duodenum mucosa appears within normal limits.  IMPRESSION: 1. Bulky and irregular soft tissue mass in the distal stomach, corresponding to that seen on the November CT Abdomen and Pelvis, now causing near complete gastric outlet obstruction. Subsequently abundant retained gastric contents were encounter did in the more proximal stomach limiting its evaluation. 2. No esophageal abnormality. Minimal visualization of the duodenum.  Electronically Signed: By: Lars Pinks M.D. On: 12/31/2014 11:45    Scheduled Meds: . dorzolamide-timolol  1 drop Left Eye BID  . heparin  5,000 Units Subcutaneous 3 times per day  . insulin aspart  0-15 Units Subcutaneous 6 times per day  . [START ON 01/02/2015] loteprednol  1 drop Left Eye QODAY   Continuous Infusions: . 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 If 7PM-7AM, please contact night-coverage at [email or web address], password [facility] 01/01/2015, 5:16 PM  LOS: 1 day

## 2015-01-01 NOTE — Progress Notes (Addendum)
    Progress Note   Subjective  Feels better with gastric decompression.    Objective   Vital signs in last 24 hours: Temp:  [97.9 F (36.6 C)-98.1 F (36.7 C)] 98.1 F (36.7 C) (01/09 0419) Pulse Rate:  [92-100] 98 (01/09 0419) Resp:  [16-18] 16 (01/09 0419) BP: (127-133)/(61-65) 133/61 mmHg (01/09 0419) SpO2:  [94 %-98 %] 98 % (01/09 0419) Weight:  [149 lb (67.586 kg)] 149 lb (67.586 kg) (01/08 1740) Last BM Date: 12/31/14   General:    white male in NAD. NGT to wall suction. Abdomen:  Soft, nontender and nondistended. Neurologic:  Alert and oriented,  grossly normal neurologically. Psych:  Cooperative. Normal mood and affect.    Lab Results:  Recent Labs  01/01/15 0535  WBC 10.6*  HGB 11.9*  HCT 39.0  PLT 242   BMET  Recent Labs  01/01/15 0535  NA 142  K 3.7  CL 107  CO2 28  GLUCOSE 127*  BUN 13  CREATININE 0.90  CALCIUM 9.6      Assessment / Plan:   74 year old male with progressive metastatic colon cancer with an enlarging gastric mass causing near complete obstruction. Continue NGT decompression. Will likely need EGD with consideration of stent placement early next week.    LOS: 1 day   Tye Savoy  01/01/2015, 9:45 AM     Attending physician's note   I have taken an interval history, reviewed the chart and examined the patient. I agree with the Advanced Practitioner's note, impression and recommendations. UGI series 1/8: Bulky and irregular soft tissue mass in the distal stomach, corresponding to that seen on the November CT Abdomen and Pelvis, now causing near complete gastric outlet obstruction. Subsequently abundant retained gastric contents were encounter in the more proximal stomach limiting its evaluation.  He is improved today with NG suction. Continue NG suction. EGD early next week when stomach is fully decompressed with consideration of enteral stent placement.   Pricilla Riffle. Fuller Plan, MD [facility]

## 2015-01-02 LAB — GLUCOSE, CAPILLARY
GLUCOSE-CAPILLARY: 111 mg/dL — AB (ref 70–99)
Glucose-Capillary: 101 mg/dL — ABNORMAL HIGH (ref 70–99)
Glucose-Capillary: 108 mg/dL — ABNORMAL HIGH (ref 70–99)
Glucose-Capillary: 112 mg/dL — ABNORMAL HIGH (ref 70–99)
Glucose-Capillary: 120 mg/dL — ABNORMAL HIGH (ref 70–99)
Glucose-Capillary: 130 mg/dL — ABNORMAL HIGH (ref 70–99)

## 2015-01-02 MED ORDER — PHENOL 1.4 % MT LIQD
1.0000 | OROMUCOSAL | Status: DC | PRN
  Administered 2015-01-02: 1 via OROMUCOSAL
  Filled 2015-01-02: qty 177

## 2015-01-02 MED ORDER — PANTOPRAZOLE SODIUM 40 MG IV SOLR
40.0000 mg | INTRAVENOUS | Status: DC
  Administered 2015-01-02 – 2015-01-07 (×5): 40 mg via INTRAVENOUS
  Filled 2015-01-02 (×6): qty 40

## 2015-01-02 NOTE — Progress Notes (Signed)
INITIAL NUTRITION ASSESSMENT  DOCUMENTATION CODES Per approved criteria  -Severe malnutrition in the context of chronic illness  Pt meets criteria for severe MALNUTRITION in the context of chronic illness as evidenced by 15% weight loss x 8 months and energy intake <75% for >/= 1 month.  INTERVENTION: -Diet advancement per MD -Recommend Resource Breeze po TID, each supplement provides 250 kcal and 9 grams of protein -RD to continue to monitor  NUTRITION DIAGNOSIS: Inadequate oral intake related to poor appetite as evidenced by 15% weight loss x 8 months.   Goal: Pt to meet >/= 90% of their estimated nutrition needs   Monitor:  Diet advancement, weight, labs, I/O's  Reason for Assessment: Pt identified as at nutrition risk on the Malnutrition Screen Tool  Admitting Dx: Abdominal pain  ASSESSMENT: 74 y.o. male  With a hx of copd, metastatic colon and gastric cancer who presents from clinic with gradually increasing abd distension and abd pain relieved by belching.   Pt followed by [place] [facility], last seen 09/15/14.  Per weight history documentation, pt has lost 26 lb since May 2015 (15% weight loss x 8 months, significant for time frame).  Pt and pt's wife report pt has had poor appetite x 6 months. Pt has been trying to eat small meals throughout the day.  Pt with no diet order d/t EGD and possible stent placement tomorrow. Once diet is advanced, pt would like to try nutritional supplement. RD to order Resource Breeze TID.  Labs reviewed:WNL  Height: Ht Readings from Last 1 Encounters:  12/31/14 5\' 7"  (1.702 m)    Weight: Wt Readings from Last 1 Encounters:  01/02/15 149 lb 8 oz (67.813 kg)    Ideal Body Weight: 148 lb  % Ideal Body Weight: 101%  Wt Readings from Last 10 Encounters:  01/02/15 149 lb 8 oz (67.813 kg)  12/30/14 149 lb 3.2 oz (67.677 kg)  12/29/14 150 lb 12.8 oz (68.402 kg)  12/01/14 152 lb 8 oz (69.174 kg)  12/01/14 152 lb 8 oz (69.174 kg)   11/10/14 152 lb (68.947 kg)  10/27/14 154 lb 11.2 oz (70.171 kg)  10/06/14 153 lb 12.8 oz (69.763 kg)  09/22/14 158 lb 1.6 oz (71.714 kg)  09/15/14 153 lb 1.6 oz (69.446 kg)    Usual Body Weight: 175 lb   % Usual Body Weight: 85%  BMI:  Body mass index is 23.41 kg/(m^2).  Estimated Nutritional Needs: Kcal: 2050-2250 Protein: 95-105g Fluid: 2.1L/day  Skin: intact  Diet Order:    EDUCATION NEEDS: -No education needs identified at this time   Intake/Output Summary (Last 24 hours) at 01/02/15 1004 Last data filed at 01/01/15 1900  Gross per 24 hour  Intake     30 ml  Output    950 ml  Net   -920 ml    Last BM: 1/8  Labs:   Recent Labs Lab 12/29/14 0906 01/01/15 0535  NA 139 142  K 4.1 3.7  CL  --  107  CO2 26 28  BUN 13.5 13  CREATININE 0.9 0.90  CALCIUM 9.7 9.6  GLUCOSE 158* 127*    CBG (last 3)   Recent Labs  01/01/15 2357 01/02/15 0418 01/02/15 0737  GLUCAP 112* 108* 120*    Scheduled Meds: . dorzolamide-timolol  1 drop Left Eye BID  . heparin  5,000 Units Subcutaneous 3 times per day  . insulin aspart  0-15 Units Subcutaneous 6 times per day  . loteprednol  1 drop Left  Eye QODAY    Continuous Infusions: . dextrose 5 % and 0.9% NaCl 75 mL/hr at 01/02/15 0609    Past Medical History  Diagnosis Date  . COPD (chronic obstructive pulmonary disease)   . Nephrolithiasis   . Diverticulosis   . Hx of adenomatous colonic polyps 04/30/07  . Glaucoma   . Hyperlipidemia   . ED (erectile dysfunction)   . Lung nodule   . Hepatic steatosis   . Thoracic spondylosis   . SOB (shortness of breath)   . Bruises easily   . Hearing loss     slight  . Contact lens/glasses fitting   . Rectal bleeding   . Hemorrhoids   . Glaucoma   . 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 Anemia   . History of radiation therapy 05/25/14-06/14/14    gastric mets 37.5Gy/  . Non-small cell lung cancer   . Colon cancer   . Cancer 05/04/14     gastric-adenocarcinoma  . Diabetes mellitus     type II    Past Surgical History  Procedure Laterality Date  . 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 Lung removal, partial  05/2006    Lt upper lobe wedge resection  . Tonsillectomy    . Kidney stone removal  07/2006  . Colonoscopy    . Polypectomy    . Colon surgery  06/2011  . 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 Trigger finger release  08/29/2012    Procedure: RELEASE TRIGGER FINGER/A-1 PULLEY;  Surgeon: Cammie Sickle., MD;  Location: [facility];  Service: Orthopedics;  Laterality: Left;  release a1 pulley left long  . Radioactive seed implantation Bilateral 05/28/2006    40seeds, lung left upper   . Superficial peroneal nerve release    . Mouth surgery      Clayton Bibles, MS, RD, LDN Pager: [phone] After Hours Pager: [phone]

## 2015-01-02 NOTE — Progress Notes (Deleted)
Patient remains belligerent at times with staff.  States, " I run the show.  You do what I say to do,".  Assisted out of bed to recliner during this shift.  Patient sat up for approximately a hour and then returned to bed.  Medicated for complaint of pain in bilateral legs x2.  Zosyn continued as ordered.  Dressings are dry and intact to bilateral lower extremities.  will monitor for changes.    Fredna Dow M

## 2015-01-02 NOTE — Progress Notes (Signed)
[facility] PROGRESS NOTE  [name] TKZ:[id number] DOB: 08-04-1941 DOA: 12/31/2014 PCP: Jerlyn Ly, MD  Assessment/Plan: 1. Gastric outlet obstruction 1. Pt notes improvement with NG to LWS 2. GI following. Appreciate input. 3. Plans for EGD and possible stent placement early next week 4. Cont with supportive care 2. GERD 1. Add protonix 3. COPD 1. Remains stable 2. No active wheezing at this time 3. On min O2 support 4. DM2 1. Pt is continued on SSI coverage 5. Metastatic gastric/colon cancer 1. Pt is followed by Dr. Benay Spice as an outpt 6. DVT prophylaxis 1. Heparin subQ  Code Status: Full Family Communication: Pt in room, family at bedside Disposition Plan: Pending   Consultants:  GI  Procedures:    Antibiotics:   (indicate start date, and stop date if known)  HPI/Subjective: Complains of indigestion.  Objective: Filed Vitals:   01/01/15 0419 01/01/15 1348 01/01/15 2008 01/02/15 0419  BP: 133/61 133/63 127/71 132/67  Pulse: 98 88 95 83  Temp: 98.1 F (36.7 C) 98.2 F (36.8 C) 98.8 F (37.1 C) 98.5 F (36.9 C)  TempSrc: Oral Oral Oral Oral  Resp: 16 16 18 18   Height:      Weight:    67.813 kg (149 lb 8 oz)  SpO2: 98% 98% 99% 90%    Intake/Output Summary (Last 24 hours) at 01/02/15 1554 Last data filed at 01/01/15 1900  Gross per 24 hour  Intake     30 ml  Output    650 ml  Net   -620 ml   Filed Weights   12/31/14 1740 01/02/15 0419  Weight: 67.586 kg (149 lb) 67.813 kg (149 lb 8 oz)    Exam:   General:  Awake, in nad, NG in place  Cardiovascular: regular, s1, s2  Respiratory: normal resp effort, no wheezing  Abdomen: soft, pos BS  Musculoskeletal: perfused, no clubbing   Data Reviewed: Basic Metabolic Panel:  Recent Labs Lab 12/29/14 0906 01/01/15 0535  NA 139 142  K 4.1 3.7  CL  --  107  CO2 26 28  GLUCOSE 158* 127*  BUN 13.5 13  CREATININE 0.9 0.90  CALCIUM 9.7 9.6   Liver Function Tests:  Recent  Labs Lab 12/29/14 0906  AST 12  ALT 10  ALKPHOS 97  BILITOT 0.36  PROT 5.8*  ALBUMIN 2.9*   No results for input(s): LIPASE, AMYLASE in the last 168 hours. No results for input(s): AMMONIA in the last 168 hours. CBC:  Recent Labs Lab 12/29/14 0905 01/01/15 0535  WBC 10.9* 10.6*  NEUTROABS 8.6*  --   HGB 12.3* 11.9*  HCT 39.7 39.0  MCV 81.8 83.9  PLT 265 242   Cardiac Enzymes: No results for input(s): CKTOTAL, CKMB, CKMBINDEX, TROPONINI in the last 168 hours. BNP (last 3 results) No results for input(s): PROBNP in the last 8760 hours. CBG:  Recent Labs Lab 01/01/15 2006 01/01/15 2357 01/02/15 0418 01/02/15 0737 01/02/15 1201  GLUCAP 107* 112* 108* 120* 130*    No results found for this or any previous visit (from the past 240 hour(s)).   Studies: No results found.  Scheduled Meds: . dorzolamide-timolol  1 drop Left Eye BID  . heparin  5,000 Units Subcutaneous 3 times per day  . insulin aspart  0-15 Units Subcutaneous 6 times per day  . loteprednol  1 drop Left Eye QODAY  . pantoprazole (PROTONIX) IV  40 mg Intravenous Q24H   Continuous Infusions: . 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 If 7PM-7AM, please contact night-coverage at [email or web address], password [facility] 01/02/2015, 3:54 PM  LOS: 2 days

## 2015-01-03 DIAGNOSIS — E43 Unspecified severe protein-calorie malnutrition: Secondary | ICD-10-CM

## 2015-01-03 LAB — GLUCOSE, CAPILLARY
GLUCOSE-CAPILLARY: 106 mg/dL — AB (ref 70–99)
GLUCOSE-CAPILLARY: 128 mg/dL — AB (ref 70–99)
GLUCOSE-CAPILLARY: 133 mg/dL — AB (ref 70–99)
GLUCOSE-CAPILLARY: 96 mg/dL (ref 70–99)
Glucose-Capillary: 113 mg/dL — ABNORMAL HIGH (ref 70–99)
Glucose-Capillary: 106 mg/dL — ABNORMAL HIGH (ref 70–99)

## 2015-01-03 NOTE — Progress Notes (Signed)
Patient ID: [name], male   DOB: Oct 29, 1941, 74 y.o.   MRN: [id number]  [facility] Progress Note  Subjective: Tolerating NG , no significant abdominal pain. Objective:  Vital signs in last 24 hours: Temp:  [98.4 F (36.9 C)-98.5 F (36.9 C)] 98.4 F (36.9 C) (01/11 0358) Pulse Rate:  [94-107] 94 (01/11 0358) Resp:  [15-20] 15 (01/11 0358) BP: (126-136)/(63-73) 126/63 mmHg (01/11 0358) SpO2:  [84 %-90 %] 84 % (01/11 0358) Last BM Date: 12/31/14 General:   Alert,  Well-developed, WM   in NAD Heart:  Regular rate and rhythm; no murmurs Pulm;clear ant Abdomen:  Soft, nontender ,full upper abdomen, BS +, no palp  Mass Extremities:  Without edema. Neurologic:  Alert and  oriented x4;  grossly normal neurologically. Psych:  Alert and cooperative. Normal mood and affect.  Intake/Output from previous day: 01/10 0701 - 01/11 0700 In: -  Out: 1150 [Urine:1100; Emesis/NG output:50] Intake/Output this shift:    Lab Results:  Recent Labs  01/01/15 0535  WBC 10.6*  HGB 11.9*  HCT 39.0  PLT 242   BMET  Recent Labs  01/01/15 0535  NA 142  K 3.7  CL 107  CO2 28  GLUCOSE 127*  BUN 13  CREATININE 0.90  CALCIUM 9.6      Assessment / Plan: #1  74 yo male  With gastric outlet obstruction secondary to mets from colon cancer . He has a long stricture in stomach. Continue NG decompression for now Plan is for EGD with Stenting of stricture tomorrow with Dr.Tomorrow Dehaas Will stop SQ heparin until after stent placed   #2 malnutrition- hold on TNA etc until see how he does with po's after stenting Active Problems:   COPD with emphysema   Colon adenocarcinoma   Diabetes mellitus   Metastasis from malignant tumor of colon   Gastric outlet obstruction   Protein-calorie malnutrition, severe     LOS: 3 days   Amy Esterwood  01/03/2015, 9:53 AM  GI Attending Note  I have personally taken an interval history, reviewed the chart, and examined the patient.  I  agree with the extender's note, impression and recommendations.  I discussed with the patient utility of stenting the stomach if technically feasible.  Sandy Salaam. Deatra Ina, MD, [facility] [phone]

## 2015-01-03 NOTE — Progress Notes (Signed)
[facility] PROGRESS NOTE  RY MOODY IRC:[id number] DOB: 01/21/1941 DOA: 12/31/2014 PCP: Jerlyn Ly, MD  Assessment/Plan: 1. Gastric outlet obstruction 1. Pt notes improvement with NG to LWS 2. GI following. Plans for egd and possible stenting 1/11 3. Cont with supportive care 2. GERD 1. Add protonix 3. COPD 1. Remains stable 2. No active wheezing at this time 3. On min O2 support 4. DM2 1. Pt is continued on SSI coverage 5. Metastatic gastric/colon cancer 1. Pt is followed by Dr. Benay Spice as an outpt 6. DVT prophylaxis 1. Heparin subQ  Code Status: Full Family Communication: Pt in room, family at bedside Disposition Plan: Pending   Consultants:  GI  Procedures:    Antibiotics:   (indicate start date, and stop date if known)  HPI/Subjective: Indigestion.improved  Objective: Filed Vitals:   01/02/15 1520 01/02/15 2035 01/03/15 0358 01/03/15 1318  BP:  136/70 126/63 123/64  Pulse:  107 94 91  Temp:  98.5 F (36.9 C) 98.4 F (36.9 C) 98.4 F (36.9 C)  TempSrc:  Oral Oral Oral  Resp:  16 15 16   Height:      Weight:      SpO2: 90% 85% 84% 91%    Intake/Output Summary (Last 24 hours) at 01/03/15 1532 Last data filed at 01/03/15 1300  Gross per 24 hour  Intake      0 ml  Output   1150 ml  Net  -1150 ml   Filed Weights   12/31/14 1740 01/02/15 0419  Weight: 67.586 kg (149 lb) 67.813 kg (149 lb 8 oz)    Exam:   General:  Awake, in nad, NG in place  Cardiovascular: regular, s1, s2  Respiratory: normal resp effort, no wheezing  Abdomen: soft, pos BS  Musculoskeletal: perfused, no clubbing   Data Reviewed: Basic Metabolic Panel:  Recent Labs Lab 12/29/14 0906 01/01/15 0535  NA 139 142  K 4.1 3.7  CL  --  107  CO2 26 28  GLUCOSE 158* 127*  BUN 13.5 13  CREATININE 0.9 0.90  CALCIUM 9.7 9.6   Liver Function Tests:  Recent Labs Lab 12/29/14 0906  AST 12  ALT 10  ALKPHOS 97  BILITOT 0.36  PROT 5.8*  ALBUMIN 2.9*    No results for input(s): LIPASE, AMYLASE in the last 168 hours. No results for input(s): AMMONIA in the last 168 hours. CBC:  Recent Labs Lab 12/29/14 0905 01/01/15 0535  WBC 10.9* 10.6*  NEUTROABS 8.6*  --   HGB 12.3* 11.9*  HCT 39.7 39.0  MCV 81.8 83.9  PLT 265 242   Cardiac Enzymes: No results for input(s): CKTOTAL, CKMB, CKMBINDEX, TROPONINI in the last 168 hours. BNP (last 3 results) No results for input(s): PROBNP in the last 8760 hours. CBG:  Recent Labs Lab 01/02/15 2034 01/03/15 0009 01/03/15 0359 01/03/15 0745 01/03/15 1245  GLUCAP 111* 133* 113* 106* 128*    No results found for this or any previous visit (from the past 240 hour(s)).   Studies: No results found.  Scheduled Meds: . dorzolamide-timolol  1 drop Left Eye BID  . insulin aspart  0-15 Units Subcutaneous 6 times per day  . loteprednol  1 drop Left Eye QODAY  . pantoprazole (PROTONIX) IV  40 mg Intravenous Q24H   Continuous Infusions: . dextrose 5 % and 0.9% NaCl 75 mL/hr at 01/03/15 5102    Active Problems:   COPD with emphysema   Colon adenocarcinoma   Diabetes mellitus   Metastasis from  malignant tumor of colon   Gastric outlet obstruction   Protein-calorie malnutrition, severe  Time spent: 10min  Clenton Esper, [facility] Pager [phone]. If 7PM-7AM, please contact night-coverage at [email or web address], password [facility] 01/03/2015, 3:32 PM  LOS: 3 days

## 2015-01-04 ENCOUNTER — Inpatient Hospital Stay (HOSPITAL_COMMUNITY): Payer: Medicare Other | Admitting: Registered Nurse

## 2015-01-04 ENCOUNTER — Encounter (HOSPITAL_COMMUNITY)
Admission: AD | Disposition: A | Discharge status: Home / Self Care | Payer: Self-pay | Source: Ambulatory Visit | Attending: Internal Medicine

## 2015-01-04 ENCOUNTER — Inpatient Hospital Stay (HOSPITAL_COMMUNITY): Payer: Medicare Other

## 2015-01-04 ENCOUNTER — Telehealth: Payer: Self-pay | Admitting: Oncology

## 2015-01-04 ENCOUNTER — Encounter (HOSPITAL_COMMUNITY): Payer: Self-pay | Admitting: *Deleted

## 2015-01-04 ENCOUNTER — Other Ambulatory Visit: Payer: Self-pay | Admitting: *Deleted

## 2015-01-04 DIAGNOSIS — K311 Adult hypertrophic pyloric stenosis: Secondary | ICD-10-CM | POA: Diagnosis present

## 2015-01-04 HISTORY — PX: ESOPHAGOGASTRODUODENOSCOPY (EGD) WITH PROPOFOL: SHX5813

## 2015-01-04 HISTORY — PX: DUODENAL STENT PLACEMENT: SHX5541

## 2015-01-04 LAB — GLUCOSE, CAPILLARY
Glucose-Capillary: 101 mg/dL — ABNORMAL HIGH (ref 70–99)
Glucose-Capillary: 101 mg/dL — ABNORMAL HIGH (ref 70–99)
Glucose-Capillary: 81 mg/dL (ref 70–99)

## 2015-01-04 SURGERY — ESOPHAGOGASTRODUODENOSCOPY (EGD) WITH PROPOFOL
Anesthesia: General

## 2015-01-04 MED ORDER — ONDANSETRON HCL 4 MG/2ML IJ SOLN
INTRAMUSCULAR | Status: AC
  Filled 2015-01-04: qty 2

## 2015-01-04 MED ORDER — PROPOFOL 10 MG/ML IV BOLUS
INTRAVENOUS | Status: DC | PRN
  Administered 2015-01-04: 100 mg via INTRAVENOUS

## 2015-01-04 MED ORDER — ONDANSETRON HCL 4 MG/2ML IJ SOLN
INTRAMUSCULAR | Status: DC | PRN
  Administered 2015-01-04: 4 mg via INTRAVENOUS

## 2015-01-04 MED ORDER — LACTATED RINGERS IV SOLN
INTRAVENOUS | Status: DC | PRN
  Administered 2015-01-04: 13:00:00 via INTRAVENOUS

## 2015-01-04 MED ORDER — SUCCINYLCHOLINE CHLORIDE 20 MG/ML IJ SOLN
INTRAMUSCULAR | Status: DC | PRN
  Administered 2015-01-04: 100 mg via INTRAVENOUS

## 2015-01-04 MED ORDER — LIDOCAINE HCL (CARDIAC) 20 MG/ML IV SOLN
INTRAVENOUS | Status: AC
  Filled 2015-01-04: qty 5

## 2015-01-04 MED ORDER — FENTANYL CITRATE 0.05 MG/ML IJ SOLN
INTRAMUSCULAR | Status: AC
  Filled 2015-01-04: qty 5

## 2015-01-04 MED ORDER — LIDOCAINE HCL (CARDIAC) 20 MG/ML IV SOLN
INTRAVENOUS | Status: DC | PRN
  Administered 2015-01-04: 75 mg via INTRAVENOUS
  Administered 2015-01-04: 25 mg via INTRATRACHEAL

## 2015-01-04 MED ORDER — SIMETHICONE 80 MG PO CHEW
160.0000 mg | CHEWABLE_TABLET | Freq: Four times a day (QID) | ORAL | Status: DC | PRN
  Administered 2015-01-04: 160 mg via ORAL
  Filled 2015-01-04 (×4): qty 2

## 2015-01-04 MED ORDER — FENTANYL CITRATE 0.05 MG/ML IJ SOLN
INTRAMUSCULAR | Status: DC | PRN
  Administered 2015-01-04 (×2): 50 ug via INTRAVENOUS
  Administered 2015-01-04: 100 ug via INTRAVENOUS

## 2015-01-04 SURGICAL SUPPLY — 15 items

## 2015-01-04 NOTE — H&P (View-Only) (Signed)
Patient ID: [name], male   DOB: Dec 11, 1941, 74 y.o.   MRN: [id number]  [facility] Progress Note  Subjective: Tolerating NG , no significant abdominal pain. Objective:  Vital signs in last 24 hours: Temp:  [98.4 F (36.9 C)-98.5 F (36.9 C)] 98.4 F (36.9 C) (01/11 0358) Pulse Rate:  [94-107] 94 (01/11 0358) Resp:  [15-20] 15 (01/11 0358) BP: (126-136)/(63-73) 126/63 mmHg (01/11 0358) SpO2:  [84 %-90 %] 84 % (01/11 0358) Last BM Date: 12/31/14 General:   Alert,  Well-developed, WM   in NAD Heart:  Regular rate and rhythm; no murmurs Pulm;clear ant Abdomen:  Soft, nontender ,full upper abdomen, BS +, no palp  Mass Extremities:  Without edema. Neurologic:  Alert and  oriented x4;  grossly normal neurologically. Psych:  Alert and cooperative. Normal mood and affect.  Intake/Output from previous day: 01/10 0701 - 01/11 0700 In: -  Out: 1150 [Urine:1100; Emesis/NG output:50] Intake/Output this shift:    Lab Results:  Recent Labs  01/01/15 0535  WBC 10.6*  HGB 11.9*  HCT 39.0  PLT 242   BMET  Recent Labs  01/01/15 0535  NA 142  K 3.7  CL 107  CO2 28  GLUCOSE 127*  BUN 13  CREATININE 0.90  CALCIUM 9.6      Assessment / Plan: #1  74 yo male  With gastric outlet obstruction secondary to mets from colon cancer . He has a long stricture in stomach. 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  I  agree with the extender's note, impression and recommendations.  I discussed with the patient utility of stenting the stomach if technically feasible.  Sandy Salaam. Deatra Ina, MD, [facility] [phone]

## 2015-01-04 NOTE — Interval H&P Note (Signed)
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 After consideration of risks, benefits and other options for treatment, the patient has consented to  Procedure(s): ESOPHAGOGASTRODUODENOSCOPY (EGD) WITH PROPOFOL with gastric stent (N/A) DUODENAL STENT PLACEMENT (N/A) as a surgical intervention .  The patient's history has been reviewed, patient examined, no change in status, stable for surgery.  I have reviewed the patient's chart and labs.  Questions were answered to the patient's satisfaction.     The recent H&P (dated *01/03/15**) was reviewed, the patient was examined and there is no change in the patients condition since that H&P was completed.   [name]  01/04/2015, 1:46 PM   [name]

## 2015-01-04 NOTE — Anesthesia Preprocedure Evaluation (Addendum)
Anesthesia Evaluation  Patient identified by MRN, date of birth, ID band Patient awake    Reviewed: Allergy & Precautions, H&P , NPO status , Patient's Chart, lab work & pertinent test results  Airway Mallampati: II  TM Distance: >3 FB Neck ROM: full    Dental no notable dental hx. (+) Dental Advisory Given, Teeth Intact   Pulmonary shortness of breath and with exertion, COPDformer smoker,  Severe COPD breath sounds clear to auscultation  Pulmonary exam normal       Cardiovascular Exercise Tolerance: Good negative cardio ROS  Rhythm:regular Rate:Normal     Neuro/Psych glaucoma negative neurological ROS  negative psych ROS   GI/Hepatic negative GI ROS, Neg liver ROS, Colon cancer with mets. Gastric/ bowel obstruction with NG tube in   Endo/Other  diabetes, Well Controlled, Type 2Diet controlled DM  Renal/GU negative Renal ROS  negative genitourinary   Musculoskeletal   Abdominal   Peds  Hematology negative hematology ROS (+)   Anesthesia Other Findings   Reproductive/Obstetrics negative OB ROS                        Anesthesia Physical Anesthesia Plan  ASA: IV  Anesthesia Plan: General   Post-op Pain Management:    Induction: Intravenous, Rapid sequence and Cricoid pressure planned  Airway Management Planned: Oral ETT  Additional Equipment:   Intra-op Plan:   Post-operative Plan: Extubation in OR  Informed Consent: I have reviewed the patients History and Physical, chart, labs and discussed the procedure including the risks, benefits and alternatives for the proposed anesthesia with the patient or authorized representative who has indicated his/her understanding and acceptance.   Dental Advisory Given  Plan Discussed with: CRNA and Surgeon  Anesthesia Plan Comments:        Anesthesia Quick Evaluation

## 2015-01-04 NOTE — Op Note (Signed)
[facility] [place], [id number]   ENDOSCOPY PROCEDURE REPORT  PATIENT: [name], Sean  MR#: [id number] BIRTHDATE: 01/15/1941 , 3  yrs. old GENDER: male ENDOSCOPIST: Inda Castle, MD REFERRED BY:  Arturo Morton, M.D. PROCEDURE DATE:  01/04/2015 PROCEDURE:  EGD w/ transendoscopic stent ASA CLASS:     Class III INDICATIONS:  gastric outlet obstruction secondary to metastatic tumor. MEDICATIONS: Per Anesthesia TOPICAL ANESTHETIC:  DESCRIPTION OF PROCEDURE: After the risks benefits and alternatives of the procedure were thoroughly explained, informed consent was obtained.  The    endoscope was introduced through the mouth and advanced to the second portion of the duodenum , Without limitations.  The instrument was slowly withdrawn as the mucosa was fully examined.    In the prepyloric antrum there was an obstructing exophytic, friable tumor completely obstructing the pylorus.  Scope could not be passed through the pylorus.  An ERCP catheter was placed through the pylorus and into the third portion of the duodenum.  once a guidewire was placed through the obstruction and into the duodenum the scope, with force, was passed into the second portion of the duodenum.Wire was placed fluoroscopically although the image was obscured because of retained barium.  Over a 0.35 mm guidewire 22 x 60 mm uncovered duodenal stent was placed.  The proximal portion of the stent protruded at least 3-4 cm into the gastric antrum.  The distal portion lay in the duodenal bulb apex. There was a large amount of retained gastric contents.        The scope was then withdrawn from the patient and the procedure completed.  COMPLICATIONS: There were no immediate complications.  ENDOSCOPIC IMPRESSION: *obstruction secondary to metastatic tumor?"status post placement of duodenal stent across stricture  RECOMMENDATIONS: upper GI series  REPEAT EXAM:  eSigned:  Inda Castle, MD 01/04/2015 3:20 PM    CC:

## 2015-01-04 NOTE — Anesthesia Postprocedure Evaluation (Signed)
  Anesthesia Post-op Note  Patient: [name]  Procedure(s) Performed: Procedure(s) (LRB): ESOPHAGOGASTRODUODENOSCOPY (EGD) WITH PROPOFOL with gastric stent (N/A) DUODENAL STENT PLACEMENT (N/A)  Patient Location: PACU  Anesthesia Type: General  Level of Consciousness: awake and alert   Airway and Oxygen Therapy: Patient Spontanous Breathing  Post-op Pain: mild  Post-op Assessment: Post-op Vital signs reviewed, Patient's Cardiovascular Status Stable, Respiratory Function Stable, Patent Airway and No signs of Nausea or vomiting  Last Vitals:  Filed Vitals:   01/04/15 1604  BP: 126/65  Pulse: 87  Temp: 36.7 C  Resp:     Post-op Vital Signs: stable   Complications: No apparent anesthesia complications

## 2015-01-04 NOTE — Telephone Encounter (Signed)
Cancelled all 01/13 apt's and r/s to 01/18 per 01/12 POF, pt in hospital..... Pt should recd updated sch before leaving hospital..Marland Kitchen

## 2015-01-04 NOTE — Transfer of Care (Signed)
Immediate Anesthesia Transfer of Care Note  Patient: [name]  Procedure(s) Performed: Procedure(s) (LRB): ESOPHAGOGASTRODUODENOSCOPY (EGD) WITH PROPOFOL with gastric stent (N/A) DUODENAL STENT PLACEMENT (N/A)  Patient Location: PACU  Anesthesia Type: General  Level of Consciousness: sedated, patient cooperative and responds to stimulation  Airway & Oxygen Therapy: Patient Spontanous Breathing and Patient connected to face mask oxgen  Post-op Assessment: Report given to PACU RN and Post -op Vital signs reviewed and stable  Post vital signs: Reviewed and stable  Complications: No apparent anesthesia complications

## 2015-01-04 NOTE — Progress Notes (Signed)
A duodenal stent was placed across the obstructed pylorus  Plan upper GI series in a.m.

## 2015-01-04 NOTE — Progress Notes (Signed)
Patient ID: [name], male   DOB: July 23, 1941, 74 y.o.   MRN: [id number]  [facility] Progress Note  Subjective: Resting comfortably- no specific complaints, tolerating Ng .  Objective:  Vital signs in last 24 hours: Temp:  [98.3 F (36.8 C)-98.4 F (36.9 C)] 98.3 F (36.8 C) (01/12 0600) Pulse Rate:  [87-93] 87 (01/12 0600) Resp:  [14-16] 15 (01/12 0600) BP: (123-127)/(64-65) 127/64 mmHg (01/12 0600) SpO2:  [90 %-91 %] 90 % (01/12 0600) Last BM Date: 12/31/14 General:   Alert,  Well-developed, thin WM   in NAD Heart:  Regular rate and rhythm; no murmurs Pulm;clear Abdomen:  Soft, , nondistended. Normal bowel sounds, without guarding, and without rebound.   Extremities:  Without edema. Neurologic:  Alert and  oriented x4;  grossly normal neurologically. Psych:  Alert and cooperative. Normal mood and affect.  Intake/Output from previous day: 01/11 0701 - 01/12 0700 In: 503 [I.V.:503] Out: 1543.2 [Urine:1543.2] Intake/Output this shift:      Assessment / Plan: #1   74 yo male with gastric outlet obstruction secondary to mets from metastatic  colon cancer.  Scheduled for EGD with Stenting today Heparin stopped  Hopefully will be able to tolerate at least liquids tomorrow #2 malnutrition  #3 COPD #4 DM Active Problems:   COPD with emphysema   Colon adenocarcinoma   Diabetes mellitus   Metastasis from malignant tumor of colon   Gastric outlet obstruction   Protein-calorie malnutrition, severe     LOS: 4 days   Amy Esterwood  01/04/2015, 9:08 AM   GI Attending Note  I have personally taken an interval history, reviewed the chart, and examined the patient.  I agree with the extender's note, impression and recommendations.  Sandy Salaam. Deatra Ina, MD, [facility] [phone]

## 2015-01-04 NOTE — Progress Notes (Signed)
[facility] PROGRESS NOTE  [name] TKP:[id number] DOB: 25-Mar-1941 DOA: 12/31/2014 PCP: Jerlyn Ly, MD  Assessment/Plan: 1. Gastric outlet obstruction 1. Pt notes improvement with NG to LWS 2. GI following. Plans for egd and possible stenting today 3. Plans for clears tomorrow 4. Cont with supportive care 2. GERD 1. Add protonix 3. COPD 1. Remains stable 2. No active wheezing at this time 3. On min O2 support 4. DM2 1. Pt is continued on SSI coverage 5. Metastatic gastric/colon cancer 1. Pt is followed by Dr. Benay Spice as an outpt 6. DVT prophylaxis 1. Heparin subQ - on hold for procedure  Code Status: Full Family Communication: Pt in room, family at bedside Disposition Plan: Pending   Consultants:  GI  Procedures:  Pending EGD 1/11  Antibiotics:    HPI/Subjective: Eager to have procedure today  Objective: Filed Vitals:   01/03/15 0358 01/03/15 1318 01/03/15 2150 01/04/15 0600  BP: 126/63 123/64 125/65 127/64  Pulse: 94 91 93 87  Temp: 98.4 F (36.9 C) 98.4 F (36.9 C) 98.4 F (36.9 C) 98.3 F (36.8 C)  TempSrc: Oral Oral Oral Oral  Resp: 15 16 14 15   Height:      Weight:      SpO2: 84% 91% 90% 90%    Intake/Output Summary (Last 24 hours) at 01/04/15 1028 Last data filed at 01/04/15 0507  Gross per 24 hour  Intake    503 ml  Output 1343.2 ml  Net -840.2 ml   Filed Weights   12/31/14 1740 01/02/15 0419  Weight: 67.586 kg (149 lb) 67.813 kg (149 lb 8 oz)    Exam:   General:  Awake, in nad, NG in place  Cardiovascular: regular, s1, s2  Respiratory: normal resp effort, no wheezing  Abdomen: soft, pos BS  Musculoskeletal: perfused, no clubbing   Data Reviewed: Basic Metabolic Panel:  Recent Labs Lab 12/29/14 0906 01/01/15 0535  NA 139 142  K 4.1 3.7  CL  --  107  CO2 26 28  GLUCOSE 158* 127*  BUN 13.5 13  CREATININE 0.9 0.90  CALCIUM 9.7 9.6   Liver Function Tests:  Recent Labs Lab 12/29/14 0906  AST 12   ALT 10  ALKPHOS 97  BILITOT 0.36  PROT 5.8*  ALBUMIN 2.9*   No results for input(s): LIPASE, AMYLASE in the last 168 hours. No results for input(s): AMMONIA in the last 168 hours. CBC:  Recent Labs Lab 12/29/14 0905 01/01/15 0535  WBC 10.9* 10.6*  NEUTROABS 8.6*  --   HGB 12.3* 11.9*  HCT 39.7 39.0  MCV 81.8 83.9  PLT 265 242   Cardiac Enzymes: No results for input(s): CKTOTAL, CKMB, CKMBINDEX, TROPONINI in the last 168 hours. BNP (last 3 results) No results for input(s): PROBNP in the last 8760 hours. CBG:  Recent Labs Lab 01/03/15 1245 01/03/15 1613 01/03/15 2014 01/04/15 0011 01/04/15 0447  GLUCAP 128* 106* 96 101* 101*    No results found for this or any previous visit (from the past 240 hour(s)).   Studies: No results found.  Scheduled Meds: . dorzolamide-timolol  1 drop Left Eye BID  . insulin aspart  0-15 Units Subcutaneous 6 times per day  . loteprednol  1 drop Left Eye QODAY  . pantoprazole (PROTONIX) IV  40 mg Intravenous Q24H   Continuous Infusions: . dextrose 5 % and 0.9% NaCl 75 mL/hr at 01/04/15 1016    Active Problems:   COPD with emphysema   Colon adenocarcinoma  Diabetes mellitus   Metastasis from malignant tumor of colon   Gastric outlet obstruction   Protein-calorie malnutrition, severe  Time spent: 10min  Kellyn Mccary, [facility] Pager [phone]. If 7PM-7AM, please contact night-coverage at [email or web address], password [facility] 01/04/2015, 10:28 AM  LOS: 4 days

## 2015-01-05 ENCOUNTER — Ambulatory Visit: Payer: Medicare Other

## 2015-01-05 ENCOUNTER — Encounter (HOSPITAL_COMMUNITY): Payer: Self-pay | Admitting: Gastroenterology

## 2015-01-05 ENCOUNTER — Inpatient Hospital Stay (HOSPITAL_COMMUNITY): Payer: Medicare Other

## 2015-01-05 ENCOUNTER — Ambulatory Visit: Payer: Medicare Other | Admitting: Oncology

## 2015-01-05 ENCOUNTER — Other Ambulatory Visit: Payer: Medicare Other

## 2015-01-05 DIAGNOSIS — E089 Diabetes mellitus due to underlying condition without complications: Secondary | ICD-10-CM

## 2015-01-05 DIAGNOSIS — J439 Emphysema, unspecified: Secondary | ICD-10-CM

## 2015-01-05 LAB — CBC
HCT: 37.7 % — ABNORMAL LOW (ref 39.0–52.0)
HEMOGLOBIN: 11.6 g/dL — AB (ref 13.0–17.0)
MCH: 25.8 pg — ABNORMAL LOW (ref 26.0–34.0)
MCHC: 30.8 g/dL (ref 30.0–36.0)
MCV: 83.8 fL (ref 78.0–100.0)
Platelets: 215 10*3/uL (ref 150–400)
RBC: 4.5 MIL/uL (ref 4.22–5.81)
RDW: 15.6 % — ABNORMAL HIGH (ref 11.5–15.5)
WBC: 8.7 10*3/uL (ref 4.0–10.5)

## 2015-01-05 LAB — GLUCOSE, CAPILLARY
GLUCOSE-CAPILLARY: 101 mg/dL — AB (ref 70–99)
Glucose-Capillary: 107 mg/dL — ABNORMAL HIGH (ref 70–99)
Glucose-Capillary: 110 mg/dL — ABNORMAL HIGH (ref 70–99)
Glucose-Capillary: 131 mg/dL — ABNORMAL HIGH (ref 70–99)
Glucose-Capillary: 95 mg/dL (ref 70–99)
Glucose-Capillary: 98 mg/dL (ref 70–99)

## 2015-01-05 LAB — BASIC METABOLIC PANEL
ANION GAP: 6 (ref 5–15)
BUN: 11 mg/dL (ref 6–23)
CHLORIDE: 107 meq/L (ref 96–112)
CO2: 28 mmol/L (ref 19–32)
CREATININE: 0.77 mg/dL (ref 0.50–1.35)
Calcium: 9.3 mg/dL (ref 8.4–10.5)
GFR calc Af Amer: >90 mL/min (ref >90)
GFR calc non Af Amer: 88 mL/min — ABNORMAL LOW (ref >90)
Glucose, Bld: 120 mg/dL — ABNORMAL HIGH (ref 70–99)
POTASSIUM: 3.6 mmol/L (ref 3.5–5.1)
SODIUM: 141 mmol/L (ref 135–145)

## 2015-01-05 LAB — MAGNESIUM: Magnesium: 1.5 mg/dL (ref 1.5–2.5)

## 2015-01-05 MED ORDER — BOOST / RESOURCE BREEZE PO LIQD
1.0000 | Freq: Three times a day (TID) | ORAL | Status: DC
  Administered 2015-01-05 – 2015-01-06 (×2): 1 via ORAL

## 2015-01-05 MED ORDER — IOHEXOL 300 MG/ML  SOLN
150.0000 mL | Freq: Once | INTRAMUSCULAR | Status: AC | PRN
  Administered 2015-01-05: 300 mL via ORAL

## 2015-01-05 MED ORDER — MAGNESIUM SULFATE 50 % IJ SOLN
3.0000 g | Freq: Once | INTRAVENOUS | Status: AC
  Administered 2015-01-05: 3 g via INTRAVENOUS
  Filled 2015-01-05: qty 6

## 2015-01-05 NOTE — Progress Notes (Signed)
Patient ID: [name], male   DOB: 08/26/1941, 74 y.o.   MRN: [id number]  [facility] Progress Note  Subjective: Had increased abdominal pain, fullness /pressure last night after procedure. Better this am ,but tender   UGI  Pending this am  Objective:  Vital signs in last 24 hours: Temp:  [97.8 F (36.6 C)-98.7 F (37.1 C)] 98.4 F (36.9 C) (01/13 0659) Pulse Rate:  [80-88] 80 (01/13 0659) Resp:  [12-16] 16 (01/13 0659) BP: (112-149)/(40-67) 112/40 mmHg (01/13 0659) SpO2:  [86 %-100 %] 91 % (01/13 0659) Last BM Date: 12/31/14 General:   Alert,  Well-developed, WM   in NAD Heart:  Regular rate and rhythm; no murmurs Pulm;clear Abdomen:  Soft, tender epigastrium, and nondistended. Normal bowel sounds, without guarding, and without rebound.   Extremities:  Without edema. Neurologic:  Alert and  oriented x4;  grossly normal neurologically. Psych:  Alert and cooperative. Normal mood and affect.  Intake/Output from previous day: 01/12 0701 - 01/13 0700 In: 1382.5 [I.V.:1382.5] Out: 42 [Urine:650] Intake/Output this shift:    Lab Results:  Recent Labs  01/05/15 0900  WBC 8.7  HGB 11.6*  HCT 37.7*  PLT 215     Assessment / Plan: #1 74 yo male with gastric outlet  obstruction  secondary to metastatic colon cancer with infiltrating tumor of gastric antrum. He is s/p stent placement yesterday-tolerating NG out but uncomfortable Will wait for UGI findings then decide about starting po's  Active Problems:   COPD with emphysema   Colon adenocarcinoma   Diabetes mellitus   Metastasis from malignant tumor of colon   Gastric outlet obstruction   Protein-calorie malnutrition, severe   Gastric outflow obstruction     LOS: 5 days   Amy Esterwood  01/05/2015, 9:22 AM   GI Attending Note  I have personally taken an interval history, reviewed the chart, and examined the patient.  I agree with the extender's note, impression and recommendations.  Sandy Salaam.  Deatra Ina, MD, [facility] [phone]

## 2015-01-05 NOTE — Evaluation (Signed)
Physical Therapy Evaluation Patient Details Name: [name] MRN: [id number] DOB: 17-Nov-1941 Today's Date: 01/05/2015   History of Present Illness  hx of copd, metastatic colon and gastric cancer followed by Dr. Benay Spice who presents from clinic with gradually increasing abd distension and abd pain relieved by belching. Pt underwent UTI series on the morning of admission and was found to have near complete gastric outlet obstruction. Gastroenterology was notified with recommendations for admission to the hospitalist service for NG placement and  EGD and gastric outlet stent placement.  Clinical Impression  Pt admitted with above diagnosis. Pt currently with functional limitations due to the deficits listed below (see PT Problem List).  Pt will benefit from skilled PT to increase their independence and safety with mobility to allow discharge to the venue listed below.  Pt moving cautiously and guarded while ambulating pushing IV pole.  Discussed HHPT with patient and he is going to think about it.  Encouraged him to walk with nursing staff later in evening.    Follow Up Recommendations Home health PT    Equipment Recommendations  None recommended by PT    Recommendations for Other Services       Precautions / Restrictions Precautions Precaution Comments: PICC line      Mobility  Bed Mobility Overal bed mobility: Needs Assistance Bed Mobility: Supine to Sit     Supine to sit: Supervision     General bed mobility comments: cues for proper form to decrease abd pressure  Transfers Overall transfer level: Needs assistance Equipment used: None Transfers: Sit to/from Stand Sit to Stand: Supervision            Ambulation/Gait Ambulation/Gait assistance: Min guard Ambulation Distance (Feet): 180 Feet Assistive device:  (pushing IV pole) Gait Pattern/deviations: Decreased step length - left;Decreased step length - right Gait velocity: decreased   General Gait Details: Pt  with guarded gait pattern while pushing IV pole.  Pt moving cautiously and requesting pain meds after gait.  Stairs            Wheelchair Mobility    Modified Rankin (Stroke Patients Only)       Balance Overall balance assessment: Needs assistance   Sitting balance-Leahy Scale: Good     Standing balance support: Single extremity supported Standing balance-Leahy Scale: Fair                               Pertinent Vitals/Pain Pain Assessment: 0-10 Pain Score: 3  Pain Intervention(s): Limited activity within patient's tolerance    Home Living Family/patient expects to be discharged to:: Private residence Living Arrangements: Spouse/significant other   Type of Home: House Home Access: Stairs to enter Entrance Stairs-Rails: Right Entrance Stairs-Number of Steps: 3 Home Layout: Two level;Able to live on main level with bedroom/bathroom        Prior Function Level of Independence: Independent               Hand Dominance        Extremity/Trunk Assessment   Upper Extremity Assessment: Defer to OT evaluation           Lower Extremity Assessment: Generalized weakness      Cervical / Trunk Assessment: Normal  Communication      Cognition Arousal/Alertness: Awake/alert Behavior During Therapy: WFL for tasks assessed/performed Overall Cognitive Status: Within Functional Limits for tasks assessed  General Comments      Exercises        Assessment/Plan    PT Assessment Patient needs continued PT services  PT Diagnosis Difficulty walking   PT Problem List Decreased activity tolerance;Decreased balance;Decreased mobility;Pain  PT Treatment Interventions Gait training;Functional mobility training;Therapeutic activities;Balance training;Patient/family education   PT Goals (Current goals can be found in the Care Plan section) Acute Rehab PT Goals Patient Stated Goal: To stronger PT Goal Formulation:  With patient Time For Goal Achievement: 01/19/15 Potential to Achieve Goals: Good    Frequency Min 3X/week   Barriers to discharge        Co-evaluation               End of Session   Activity Tolerance: Patient tolerated treatment well Patient left: in chair;with call bell/phone within reach Nurse Communication: Mobility status;Patient requests pain meds;Other (comment) (requested nursing staff to walk with pt later if able)         Time: [phone] PT Time Calculation (min) (ACUTE ONLY): 20 min   Charges:   PT Evaluation $Initial PT Evaluation Tier I: 1 Procedure PT Treatments $Gait Training: 8-22 mins   PT G Codes:        Linwood Gullikson LUBECK 01/05/2015, 2:37 PM

## 2015-01-05 NOTE — Progress Notes (Signed)
[facility] PROGRESS NOTE  [name] WJX:[id number] DOB: 02-Mar-1941 DOA: 12/31/2014 PCP: Jerlyn Ly, MD  Assessment/Plan: #1 gastric outlet obstruction Secondary to metastatic colon cancer with infiltrating tumor of the gastric antrum status post stent placement. NG tube has been discontinued. Patient with some clinical improvement. Upper GI series with extrinsic narrowing in the second portion of the duodenum. Patient to be scheduled for another endoscopy with stent placement to this area to ensure patency per GI tomorrow. GI following and appreciate input and recommendations.  #2 gastroesophageal reflux disease PPI.  #3 COPD Stable.  #4 type 2 diabetes mellitus Check a hemoglobin A1c. CBGs have ranged from 98 - 131. Continue sliding scale insulin.  #5 metastatic colon cancer/gastric cancer Follow-up with Dr. Benay Spice of oncology as outpatient.  #6 protein calorie malnutrition When tolerating oral intake, will place on nutritional supplements.  #7 prophylaxis SCDs for DVT prophylaxis.  Code Status: Full Family Communication: Updated patient. No family present. Disposition Plan: Home when medically stable and tolerating oral intake.   Consultants:  Gastroenterology: Dr. Fuller Plan 01/01/2015  Procedures:  EGD with trans-endoscopic stent 01/04/2015 per Dr. Deatra Ina  Upper GI series 01/05/2015, 12/31/2014  Abdominal x-ray 01/04/2015  Antibiotics:  None  HPI/Subjective: Patient states abdominal pressure/bloating on admission has improved however still some diffuse tenderness which is chronic.  Objective: Filed Vitals:   01/05/15 0659  BP: 112/40  Pulse: 80  Temp: 98.4 F (36.9 C)  Resp: 16    Intake/Output Summary (Last 24 hours) at 01/05/15 1547 Last data filed at 01/05/15 0600  Gross per 24 hour  Intake  782.5 ml  Output    650 ml  Net  132.5 ml   Filed Weights   12/31/14 1740 01/02/15 0419  Weight: 67.586 kg (149 lb) 67.813 kg (149 lb 8 oz)     Exam:   General:  NAD  Cardiovascular: Regular rate rhythm no murmurs rubs or gallops  Respiratory: CTAB anterior lung fields   Abdomen: Soft, mild diffuse tenderness to palpation which is chronic per patient,  nondistended, positive bowel sounds.   Musculoskeletal: No clubbing cyanosis or edema.  Data Reviewed: Basic Metabolic Panel:  Recent Labs Lab 01/01/15 0535 01/05/15 0900  NA 142 141  K 3.7 3.6  CL 107 107  CO2 28 28  GLUCOSE 127* 120*  BUN 13 11  CREATININE 0.90 0.77  CALCIUM 9.6 9.3  MG  --  1.5   Liver Function Tests: No results for input(s): AST, ALT, ALKPHOS, BILITOT, PROT, ALBUMIN in the last 168 hours. No results for input(s): LIPASE, AMYLASE in the last 168 hours. No results for input(s): AMMONIA in the last 168 hours. CBC:  Recent Labs Lab 01/01/15 0535 01/05/15 0900  WBC 10.6* 8.7  HGB 11.9* 11.6*  HCT 39.0 37.7*  MCV 83.9 83.8  PLT 242 215   Cardiac Enzymes: No results for input(s): CKTOTAL, CKMB, CKMBINDEX, TROPONINI in the last 168 hours. BNP (last 3 results) No results for input(s): PROBNP in the last 8760 hours. CBG:  Recent Labs Lab 01/04/15 0447 01/04/15 1958 01/05/15 0009 01/05/15 0513 01/05/15 1312  GLUCAP 101* 81 98 101* 107*    No results found for this or any previous visit (from the past 240 hour(s)).   Studies: Dg Abd 1 View - Kub  01/04/2015   CLINICAL DATA:  Gastric outflow obstruction.  EXAM: ABDOMEN - 1 VIEW; DG C-ARM 1-60 MIN - NRPT [facility]  COMPARISON:  December 31, 2014.  FINDINGS: Three fluoroscopic images of the  stomach were submitted for review. These demonstrate stent placement involving the distal stomach for treatment of mass.  IMPRESSION: Stent placement in distal stomach for treatment of mass.   Electronically Signed   By: Sabino Dick M.D.   On: 01/04/2015 15:38   Dg Duanne Limerick W/water Sol Cm  01/05/2015   CLINICAL DATA:  Status post duodenal stent placement 1 day prior. Gastric outlet obstruction.  EXAM:  WATER SOLUBLE UPPER GI SERIES  TECHNIQUE: Single-column upper GI series was performed using water soluble contrast.  CONTRAST:  365mL OMNIPAQUE IOHEXOL 300 MG/ML  SOLN  COMPARISON:  Upper GI of 12/31/2014.  CT of 11/09/2014.  FLUOROSCOPY TIME:  5 min and 31 seconds  FINDINGS: Initial images demonstrate retained contrast throughout the stomach and colon. The stent is identified extending from the gastric antrum into the proximal portion of the duodenum.  After ingestion of contrast, and positioning in various obliquities (primarily RPO and right-side-down), contrast is seen to enter the descending duodenum. Example series 13. There is residual narrowing distal to the stent, including on image 1 of series 5. Also image 1 of series 10. Contrast does traverse this area. No contrast extravasation to suggest perforation. Persistent component of gastric outlet obstruction as evidenced by mildly distended, contrast filled stomach.  IMPRESSION: Stent extending from the pylorus into the proximal duodenum. No evidence of perforation. Residual narrowing of the descending duodenal lumen just distal to the stent. This results in a component of residual gastric outlet obstruction.   Electronically Signed   By: Abigail Miyamoto M.D.   On: 01/05/2015 11:22   Dg C-arm 1-60 Min-no Report  01/04/2015   CLINICAL DATA:  Gastric outflow obstruction.  EXAM: ABDOMEN - 1 VIEW; DG C-ARM 1-60 MIN - NRPT [facility]  COMPARISON:  December 31, 2014.  FINDINGS: Three fluoroscopic images of the stomach were submitted for review. These demonstrate stent placement involving the distal stomach for treatment of mass.  IMPRESSION: Stent placement in distal stomach for treatment of mass.   Electronically Signed   By: Sabino Dick M.D.   On: 01/04/2015 15:38    Scheduled Meds: . dorzolamide-timolol  1 drop Left Eye BID  . feeding supplement (RESOURCE BREEZE)  1 Container Oral TID BM  . insulin aspart  0-15 Units Subcutaneous 6 times per day  . loteprednol   1 drop Left Eye QODAY  . magnesium sulfate 1 - 4 g bolus IVPB  3 g Intravenous Once  . pantoprazole (PROTONIX) IV  40 mg Intravenous Q24H   Continuous Infusions: . dextrose 5 % and 0.9% NaCl 75 mL/hr at 01/04/15 1934    Principal Problem:   Gastric outlet obstruction Active Problems:   COPD with emphysema   Colon adenocarcinoma   Diabetes mellitus   Metastasis from malignant tumor of colon   Protein-calorie malnutrition, severe   Gastric outflow obstruction    Time spent: 30 minutes    Caliyah Sieh M.D. [facility] Pager [phone]. If 7PM-7AM, please contact night-coverage at [email or web address], password [facility] 01/05/2015, 3:47 PM  LOS: 5 days

## 2015-01-05 NOTE — Care Management Note (Signed)
CARE MANAGEMENT NOTE 01/05/2015  Patient:  [name], [name]   Account Number:  [id number]  Date Initiated:  01/05/2015  Documentation initiated by:  Marney Doctor  Subjective/Objective Assessment:   74 yo admitted with Gastric Outlet Obstruction     Action/Plan:   From home with spouse   Anticipated DC Date:  01/07/2015   Anticipated DC Plan:  [place]  CM consult      Choice offered to / List presented to:  C-1 Patient           Status of service:  In process, will continue to follow Medicare Important Message given?   (If response is "NO", the following Medicare IM given date fields will be blank) Date Medicare IM given:   Medicare IM given by:   Date Additional Medicare IM given:   Additional Medicare IM given by:    Discharge Disposition:    Per UR Regulation:  Reviewed for med. necessity/level of care/duration of stay  If discussed at [place] of Stay Meetings, dates discussed:    Comments:  01/05/15 Marney Doctor RN,BSN,NCM [phone] Met with pt about DC needs.  PT is recommending HHPT at DC. Pt is unsure if he feels like he needs it at home.  This CM gave pt list of home health providers and told him to think about it and this CM will check back in with him. No other DC needs noted at this time.

## 2015-01-05 NOTE — Progress Notes (Signed)
GI series demonstrates some extrinsic narrowing in the second portion of the duodenum that may be retarding emptying of the stomach.  This was not appreciated at endoscopy, however, I think it is worthwhile placing another stent to this area to absolutely sure that it is widely patent.  A Accordingly, will schedule for a.m.

## 2015-01-06 ENCOUNTER — Encounter (HOSPITAL_COMMUNITY): Payer: Self-pay

## 2015-01-06 ENCOUNTER — Inpatient Hospital Stay (HOSPITAL_COMMUNITY): Payer: Medicare Other | Admitting: Anesthesiology

## 2015-01-06 ENCOUNTER — Inpatient Hospital Stay (HOSPITAL_COMMUNITY): Payer: Medicare Other

## 2015-01-06 ENCOUNTER — Encounter (HOSPITAL_COMMUNITY)
Admission: AD | Disposition: A | Discharge status: Home / Self Care | Payer: Self-pay | Source: Ambulatory Visit | Attending: Internal Medicine

## 2015-01-06 DIAGNOSIS — K209 Esophagitis, unspecified without bleeding: Secondary | ICD-10-CM | POA: Diagnosis present

## 2015-01-06 HISTORY — PX: DUODENAL STENT PLACEMENT: SHX5541

## 2015-01-06 HISTORY — PX: ESOPHAGOGASTRODUODENOSCOPY: SHX5428

## 2015-01-06 LAB — GLUCOSE, CAPILLARY
GLUCOSE-CAPILLARY: 104 mg/dL — AB (ref 70–99)
GLUCOSE-CAPILLARY: 94 mg/dL (ref 70–99)
Glucose-Capillary: 96 mg/dL (ref 70–99)
Glucose-Capillary: 87 mg/dL (ref 70–99)

## 2015-01-06 LAB — CBC WITH DIFFERENTIAL/PLATELET
BASOS ABS: 0 10*3/uL (ref 0.0–0.1)
Basophils Relative: 0 % (ref 0–1)
EOS ABS: 0.4 10*3/uL (ref 0.0–0.7)
EOS PCT: 5 % (ref 0–5)
HCT: 36.9 % — ABNORMAL LOW (ref 39.0–52.0)
HEMOGLOBIN: 11.4 g/dL — AB (ref 13.0–17.0)
Lymphocytes Relative: 9 % — ABNORMAL LOW (ref 12–46)
Lymphs Abs: 0.8 10*3/uL (ref 0.7–4.0)
MCH: 25.6 pg — ABNORMAL LOW (ref 26.0–34.0)
MCHC: 30.9 g/dL (ref 30.0–36.0)
MCV: 82.7 fL (ref 78.0–100.0)
Monocytes Absolute: 0.6 10*3/uL (ref 0.1–1.0)
Monocytes Relative: 8 % (ref 3–12)
NEUTROS PCT: 78 % — AB (ref 43–77)
Neutro Abs: 6.7 10*3/uL (ref 1.7–7.7)
PLATELETS: 208 10*3/uL (ref 150–400)
RBC: 4.46 MIL/uL (ref 4.22–5.81)
RDW: 15.5 % (ref 11.5–15.5)
WBC: 8.6 10*3/uL (ref 4.0–10.5)

## 2015-01-06 LAB — BASIC METABOLIC PANEL
Anion gap: 4 — ABNORMAL LOW (ref 5–15)
BUN: 10 mg/dL (ref 6–23)
CO2: 26 mmol/L (ref 19–32)
Calcium: 8.4 mg/dL (ref 8.4–10.5)
Chloride: 103 mEq/L (ref 96–112)
Creatinine, Ser: 0.79 mg/dL (ref 0.50–1.35)
GFR calc Af Amer: >90 mL/min (ref >90)
GFR calc non Af Amer: 87 mL/min — ABNORMAL LOW (ref >90)
Glucose, Bld: 107 mg/dL — ABNORMAL HIGH (ref 70–99)
Potassium: 3.2 mmol/L — ABNORMAL LOW (ref 3.5–5.1)
Sodium: 133 mmol/L — ABNORMAL LOW (ref 135–145)

## 2015-01-06 LAB — HEMOGLOBIN A1C
Hgb A1c MFr Bld: 6.4 % — ABNORMAL HIGH (ref <5.7)
Mean Plasma Glucose: 137 mg/dL — ABNORMAL HIGH (ref <117)

## 2015-01-06 LAB — MAGNESIUM: Magnesium: 1.9 mg/dL (ref 1.5–2.5)

## 2015-01-06 SURGERY — EGD (ESOPHAGOGASTRODUODENOSCOPY)
Anesthesia: Monitor Anesthesia Care

## 2015-01-06 MED ORDER — POTASSIUM CHLORIDE 10 MEQ/100ML IV SOLN
10.0000 meq | INTRAVENOUS | Status: AC
  Administered 2015-01-06 (×5): 10 meq via INTRAVENOUS
  Filled 2015-01-06 (×5): qty 100

## 2015-01-06 MED ORDER — FENTANYL CITRATE 0.05 MG/ML IJ SOLN
25.0000 ug | INTRAMUSCULAR | Status: DC | PRN
Start: 2015-01-06 — End: 2015-01-07

## 2015-01-06 MED ORDER — PROMETHAZINE HCL 25 MG/ML IJ SOLN: 6.2500 mg | INTRAMUSCULAR | Status: DC | PRN

## 2015-01-06 MED ORDER — PROPOFOL INFUSION 10 MG/ML OPTIME
INTRAVENOUS | Status: DC | PRN
  Administered 2015-01-06: 120 ug/kg/min via INTRAVENOUS

## 2015-01-06 MED ORDER — DEXTROSE-NACL 5-0.9 % IV SOLN
INTRAVENOUS | Status: DC
  Administered 2015-01-06 – 2015-01-07 (×2): via INTRAVENOUS
  Filled 2015-01-06 (×4): qty 1000

## 2015-01-06 MED ORDER — LIDOCAINE HCL (CARDIAC) 20 MG/ML IV SOLN
INTRAVENOUS | Status: AC
  Filled 2015-01-06: qty 5

## 2015-01-06 MED ORDER — LACTATED RINGERS IV SOLN: INTRAVENOUS | Status: DC

## 2015-01-06 MED ORDER — MEPERIDINE HCL 50 MG/ML IJ SOLN: 6.2500 mg | INTRAMUSCULAR | Status: DC | PRN

## 2015-01-06 MED ORDER — PROPOFOL 10 MG/ML IV BOLUS
INTRAVENOUS | Status: AC
  Filled 2015-01-06: qty 20

## 2015-01-06 MED ORDER — LACTATED RINGERS IV SOLN
INTRAVENOUS | Status: DC | PRN
  Administered 2015-01-06: 14:00:00 via INTRAVENOUS

## 2015-01-06 MED ORDER — LIDOCAINE HCL (CARDIAC) 20 MG/ML IV SOLN
INTRAVENOUS | Status: DC | PRN
  Administered 2015-01-06: 100 mg via INTRAVENOUS

## 2015-01-06 MED ORDER — FENTANYL CITRATE 0.05 MG/ML IJ SOLN: 25.0000 ug | INTRAMUSCULAR | Status: DC | PRN

## 2015-01-06 NOTE — Progress Notes (Signed)
Patient ID: [name], male   DOB: [birth year], 74 y.o.   MRN: [id number]  [facility] Progress Note  Subjective: Feels ok- no significant pain, no vomiting  Objective:  Vital signs in last 24 hours: Temp:  [97.7 F (36.5 C)-98 F (36.7 C)] 98 F (36.7 C) (01/14 0553) Pulse Rate:  [83-90] 83 (01/14 0553) Resp:  [16] 16 (01/14 0553) BP: (119)/(65) 119/65 mmHg (01/14 0553) SpO2:  [93 %-94 %] 93 % (01/14 0553) Last BM Date: 01/05/15 General:   Alert,  Well-developed,  WM  in NAD Heart:  Regular rate and rhythm; no murmurs Pulm;clear Abdomen:  Soft, , nondistended. Normal bowel sounds, without guarding, and without rebound.   Extremities:  Without edema. Neurologic:  Alert and  oriented x4;  grossly normal neurologically. Psych:  Alert and cooperative. Normal mood and affect.  Intake/Output from previous day: 01/13 0701 - 01/14 0700 In: 3151.9 [I.V.:3151.9] Out: 200 [Urine:200] Intake/Output this shift:    Lab Results:  Recent Labs  01/05/15 0900 01/06/15 0530  WBC 8.7 8.6  HGB 11.6* 11.4*  HCT 37.7* 36.9*  PLT 215 208   BMET  Recent Labs  01/05/15 0900 01/06/15 0530  NA 141 133*  K 3.6 3.2*  CL 107 103  CO2 28 26  GLUCOSE 120* 107*  BUN 11 10  CREATININE 0.77 0.79  CALCIUM 9.3 8.4   LFT No results for input(s): PROT, ALBUMIN, AST, ALT, ALKPHOS, BILITOT, BILIDIR, IBILI in the last 72 hours. PT/INR No results for input(s): LABPROT, INR in the last 72 hours. Hepatitis Panel No results for input(s): HEPBSAG, HCVAB, HEPAIGM, HEPBIGM in the last 72 hours.  Assessment / Plan: #1 74 yo male with metastatic colon cancer, with gastric outlet obstruction secondary to metastaic disease infiltrating distal stomach- he is s/p stent placement earlier this week and has residual narrowing distal to stent- felt extrinsic He is scheduled for EGD with placement of second stent more distally #2 hypokalemia- being replaced #3 DM Principal Problem:   Gastric  outlet obstruction Active Problems:   COPD with emphysema   Colon adenocarcinoma   Diabetes mellitus   Metastasis from malignant tumor of colon   Protein-calorie malnutrition, severe   Gastric outflow obstruction     LOS: 6 days   Amy Esterwood  01/06/2015, 10:06 AM   GI Attending Note  I have personally taken an interval history, reviewed the chart, and examined the patient.  I agree with the extender's note, impression and recommendations.  Sandy Salaam. Deatra Ina, MD, [facility] [phone]

## 2015-01-06 NOTE — Anesthesia Procedure Notes (Signed)
Procedure Name: MAC Date/Time: 01/06/2015 2:47 PM Performed by: Carleene Cooper A Pre-anesthesia Checklist: Timeout performed, Patient identified, Emergency Drugs available, Suction available and Patient being monitored Patient Re-evaluated:Patient Re-evaluated prior to inductionOxygen Delivery Method: Nasal cannula Dental Injury: Teeth and Oropharynx as per pre-operative assessment

## 2015-01-06 NOTE — Op Note (Signed)
[facility] [place], [address]   ENDOSCOPY PROCEDURE REPORT  PATIENT: [name], Sean  MR#: [id number] BIRTHDATE: September 30, 1941 , 53  yrs. old GENDER: male ENDOSCOPIST: Inda Castle, MD REFERRED BY: PROCEDURE DATE:  01/06/2015 PROCEDURE:  EGD w/ transendoscopic stent ASA CLASS:     Class III INDICATIONS:  gastric outlet obstruction. MEDICATIONS: Monitored anesthesia care TOPICAL ANESTHETIC:  DESCRIPTION OF PROCEDURE: After the risks benefits and alternatives of the procedure were thoroughly explained, informed consent was obtained.  The [id number]-[id number] ([id number]) endoscope was introduced through the mouth and advanced to the second portion of the duodenum , Without limitations.  The instrument was slowly withdrawn as the mucosa was fully examined.    There was a significant amount of retained gastric contents.  The previously placed duodenal stent was in place with about 4 cm of the stent protruding from the pylorus.  The 9 mm scope easily traversed the stent.  It could easily be passed into the third portion of the duodenum.  Prior x-ray demonstrated extrinsic compression in the second portion of the duodenum  at the apex of the bulb. A #22 x 60 mm uncovered duodenal stent was placed beginning at approximately the midportion of the indwelling stent.  The distal portion of the stent extended into the junction between the second and third portions of the duodenum. In the esophagus there were few erosions at the GE junction. there was a significant amount of exophytic tumor at the prepyloric antrum and pylorus.         The scope was then withdrawn from the patient and the procedure completed.  COMPLICATIONS: There were no immediate complications.  ENDOSCOPIC IMPRESSION: Extension of duodenal stent from gastric antrum to third portion of duodenum Esophagitis  RECOMMENDATIONS: full liquid diet PPI therapy  REPEAT EXAM:  eSigned:  Inda Castle, MD 01/06/2015 3:19 PM    CC:  PATIENT NAME:  Sean, [name] MR#: [id number]

## 2015-01-06 NOTE — H&P (View-Only) (Signed)
GI series demonstrates some extrinsic narrowing in the second portion of the duodenum that may be retarding emptying of the stomach.  This was not appreciated at endoscopy, however, I think it is worthwhile placing another stent to this area to absolutely sure that it is widely patent.  A Accordingly, will schedule for a.m.

## 2015-01-06 NOTE — Transfer of Care (Signed)
Immediate Anesthesia Transfer of Care Note  Patient: [name]  Procedure(s) Performed: Procedure(s): ESOPHAGOGASTRODUODENOSCOPY (EGD)  with gastric/duodenal stent (N/A) DUODENAL STENT PLACEMENT (N/A)  Patient Location: PACU and Endoscopy Unit  Anesthesia Type:MAC  Level of Consciousness: awake, alert , oriented and patient cooperative  Airway & Oxygen Therapy: Patient Spontanous Breathing and Patient connected to face mask oxygen  Post-op Assessment: Report given to PACU RN, Post -op Vital signs reviewed and stable and Patient moving all extremities  Post vital signs: Reviewed and stable  Complications: No apparent anesthesia complications

## 2015-01-06 NOTE — Evaluation (Signed)
Occupational Therapy Evaluation Patient Details Name: [name] MRN: [id number] DOB: [birth year] Today's Date: 01/06/2015    History of Present Illness hx of copd, metastatic colon and gastric cancer followed by Dr. Benay Spice who presents from clinic with gradually increasing abd distension and abd pain relieved by belching. Pt underwent UTI series on the morning of admission and was found to have near complete gastric outlet obstruction. Gastroenterology was notified with recommendations for admission to the hospitalist service for NG placement and  EGD and gastric outlet stent placement.   Clinical Impression   No further OT needs. Pt feels he is baseline with ADL activity     Follow Up Recommendations  No OT follow up    Equipment Recommendations  None recommended by OT    Recommendations for Other Services       Precautions / Restrictions Precautions Precaution Comments: PICC line      Mobility Bed Mobility               General bed mobility comments: pt in chair  Transfers Overall transfer level: Independent                    Balance                                            ADL Overall ADL's : At baseline                                       General ADL Comments: pt feels he is baseline with ADL activity      Vision                            Pertinent Vitals/Pain Pain Assessment: No/denies pain     Hand Dominance     Extremity/Trunk Assessment Upper Extremity Assessment Upper Extremity Assessment: Generalized weakness           Communication Communication Communication: No difficulties   Cognition Arousal/Alertness: Awake/alert Behavior During Therapy: WFL for tasks assessed/performed Overall Cognitive Status: Within Functional Limits for tasks assessed                     General Comments       Exercises       Shoulder Instructions      Home Living  Family/patient expects to be discharged to:: Private residence Living Arrangements: Spouse/significant other   Type of Home: House Home Access: Stairs to enter [employer] of Steps: 3 Entrance Stairs-Rails: Right Home Layout: Two level;Able to live on main level with bedroom/bathroom     Bathroom Shower/Tub: Occupational psychologist: Standard     Home Equipment: None          Prior Functioning/Environment Level of Independence: Independent                            Barriers to D/C:            Co-evaluation              End of Session    Activity Tolerance: Patient tolerated treatment well Patient left: in bed;with family/visitor present   Time: 9678-9381 OT  Time Calculation (min): 11 min Charges:  OT General Charges $OT Visit: 1 Procedure OT Evaluation $Initial OT Evaluation Tier I: 1 Procedure G-Codes:    Betsy Pries 01-29-2015, 12:54 PM

## 2015-01-06 NOTE — Progress Notes (Signed)
A second duodenal stent was successfully placed.  May begin full liquid diet.

## 2015-01-06 NOTE — Anesthesia Preprocedure Evaluation (Addendum)
Anesthesia Evaluation  Patient identified by MRN, date of birth, ID band Patient awake    Reviewed: Allergy & Precautions, H&P , Patient's Chart, lab work & pertinent test results, reviewed documented beta blocker date and time   History of Anesthesia Complications Negative for: history of anesthetic complications  Airway Mallampati: II  TM Distance: >3 FB Neck ROM: full    Dental   Pulmonary shortness of breath, COPDformer smoker,  breath sounds clear to auscultation        Cardiovascular Exercise Tolerance: Good Rhythm:regular Rate:Normal     Neuro/Psych  Neuromuscular disease    GI/Hepatic   Endo/Other  diabetes  Renal/GU Renal disease     Musculoskeletal  (+) Arthritis -,   Abdominal   Peds  Hematology  (+) anemia ,   Anesthesia Other Findings Hepatic steatosis Non-Small cell lung cancer Chemo neuropathy  Reproductive/Obstetrics                            Anesthesia Physical Anesthesia Plan  ASA: III  Anesthesia Plan: MAC   Post-op Pain Management:    Induction:   Airway Management Planned: Nasal Cannula  Additional Equipment:   Intra-op Plan:   Post-operative Plan:   Informed Consent: I have reviewed the patients History and Physical, chart, labs and discussed the procedure including the risks, benefits and alternatives for the proposed anesthesia with the patient or authorized representative who has indicated his/her understanding and acceptance.   Dental Advisory Given  Plan Discussed with: CRNA and Surgeon  Anesthesia Plan Comments:        Anesthesia Quick Evaluation

## 2015-01-06 NOTE — Progress Notes (Signed)
[facility] PROGRESS NOTE  [name] XIP:[id number] DOB: 05-Mar-1941 DOA: 12/31/2014 PCP: Jerlyn Ly, MD  Assessment/Plan: #1 gastric outlet obstruction Secondary to metastatic colon cancer with infiltrating tumor of the gastric antrum status post stent placement. NG tube has been discontinued. Patient with some clinical improvement. Upper GI series with extrinsic narrowing in the second portion of the duodenum. Patient s/p endoscopy with stent placement to this area to ensure patency per GI which was done today. Patient placed on full liquids. GI following and appreciate input and recommendations.  #2 gastroesophageal reflux disease/Esophagitis PPI.  #3 COPD Stable.  #4 type 2 diabetes mellitus Check a hemoglobin A1c. CBGs have ranged from 87 - 104. Continue sliding scale insulin.  #5 metastatic colon cancer/gastric cancer Follow-up with Dr. Benay Spice of oncology as outpatient.  #6 protein calorie malnutrition When tolerating oral intake, will place on nutritional supplements.  #7 prophylaxis SCDs for DVT prophylaxis.  Code Status: Full Family Communication: Updated patient. No family present. Disposition Plan: Home when medically stable and tolerating oral intake.   Consultants:  Gastroenterology: Dr. Fuller Plan 01/01/2015  Procedures:  EGD with trans-endoscopic stent 01/04/2015 per Dr. Deatra Ina  Upper GI series 01/05/2015, 12/31/2014  Abdominal x-ray 01/04/2015  EGD with transendoscopic stent 01/06/15 Dr Deatra Ina  Antibiotics:  None  HPI/Subjective: Patient states abdominal pressure/bloating on admission has improved however still some diffuse tenderness which is chronic. Patient denies any nausea, or emesis.  Objective: Filed Vitals:   01/06/15 1615  BP: 151/66  Pulse: 71  Temp: 97.8 F (36.6 C)  Resp: 16    Intake/Output Summary (Last 24 hours) at 01/06/15 1813 Last data filed at 01/06/15 1615  Gross per 24 hour  Intake 3671.9 ml  Output    200 ml   Net 3471.9 ml   Filed Weights   12/31/14 1740 01/02/15 0419  Weight: 67.586 kg (149 lb) 67.813 kg (149 lb 8 oz)    Exam:   General:  NAD  Cardiovascular: Regular rate rhythm no murmurs rubs or gallops  Respiratory: CTAB anterior lung fields   Abdomen: Soft, mild diffuse tenderness to palpation which is chronic per patient,  nondistended, positive bowel sounds.   Musculoskeletal: No clubbing cyanosis or edema.  Data Reviewed: Basic Metabolic Panel:  Recent Labs Lab 01/01/15 0535 01/05/15 0900 01/06/15 0530  NA 142 141 133*  K 3.7 3.6 3.2*  CL 107 107 103  CO2 28 28 26   GLUCOSE 127* 120* 107*  BUN 13 11 10   CREATININE 0.90 0.77 0.79  CALCIUM 9.6 9.3 8.4  MG  --  1.5 1.9   Liver Function Tests: No results for input(s): AST, ALT, ALKPHOS, BILITOT, PROT, ALBUMIN in the last 168 hours. No results for input(s): LIPASE, AMYLASE in the last 168 hours. No results for input(s): AMMONIA in the last 168 hours. CBC:  Recent Labs Lab 01/01/15 0535 01/05/15 0900 01/06/15 0530  WBC 10.6* 8.7 8.6  NEUTROABS  --   --  6.7  HGB 11.9* 11.6* 11.4*  HCT 39.0 37.7* 36.9*  MCV 83.9 83.8 82.7  PLT 242 215 208   Cardiac Enzymes: No results for input(s): CKTOTAL, CKMB, CKMBINDEX, TROPONINI in the last 168 hours. BNP (last 3 results) No results for input(s): PROBNP in the last 8760 hours. CBG:  Recent Labs Lab 01/05/15 2011 01/05/15 2349 01/06/15 0356 01/06/15 0750 01/06/15 1614  GLUCAP 131* 95 96 104* 87    No results found for this or any previous visit (from the past 240 hour(s)).  Studies: Dg Abd 1 View - Kub  01/06/2015   CLINICAL DATA:  Endoscopic stent placement  EXAM: DG C-ARM 61-120 MIN; ABDOMEN - 1 VIEW  TECHNIQUE: Three fluoroscopic spot images  : COMPARISON:  01/05/2015  FINDINGS: Pyloric stent again identified with very limited detail. Motion artifact degrades the images.  IMPRESSION: Pyloric stent again identified. Different positioning and  magnification +motion renders comparison to prior study difficult.   Electronically Signed   By: Skipper Cliche M.D.   On: 01/06/2015 17:10   Dg Ugi W/water Sol Cm  01/05/2015   CLINICAL DATA:  Status post duodenal stent placement 1 day prior. Gastric outlet obstruction.  EXAM: WATER SOLUBLE UPPER GI SERIES  TECHNIQUE: Single-column upper GI series was performed using water soluble contrast.  CONTRAST:  330mL OMNIPAQUE IOHEXOL 300 MG/ML  SOLN  COMPARISON:  Upper GI of 12/31/2014.  CT of 11/09/2014.  FLUOROSCOPY TIME:  5 min and 31 seconds  FINDINGS: Initial images demonstrate retained contrast throughout the stomach and colon. The stent is identified extending from the gastric antrum into the proximal portion of the duodenum.  After ingestion of contrast, and positioning in various obliquities (primarily RPO and right-side-down), contrast is seen to enter the descending duodenum. Example series 13. There is residual narrowing distal to the stent, including on image 1 of series 5. Also image 1 of series 10. Contrast does traverse this area. No contrast extravasation to suggest perforation. Persistent component of gastric outlet obstruction as evidenced by mildly distended, contrast filled stomach.  IMPRESSION: Stent extending from the pylorus into the proximal duodenum. No evidence of perforation. Residual narrowing of the descending duodenal lumen just distal to the stent. This results in a component of residual gastric outlet obstruction.   Electronically Signed   By: Abigail Miyamoto M.D.   On: 01/05/2015 11:22   Dg C-arm 1-60 Min  01/06/2015   CLINICAL DATA:  Endoscopic stent placement  EXAM: DG C-ARM 61-120 MIN; ABDOMEN - 1 VIEW  TECHNIQUE: Three fluoroscopic spot images  : COMPARISON:  01/05/2015  FINDINGS: Pyloric stent again identified with very limited detail. Motion artifact degrades the images.  IMPRESSION: Pyloric stent again identified. Different positioning and magnification +motion renders  comparison to prior study difficult.   Electronically Signed   By: Skipper Cliche M.D.   On: 01/06/2015 17:10    Scheduled Meds: . dorzolamide-timolol  1 drop Left Eye BID  . feeding supplement (RESOURCE BREEZE)  1 Container Oral TID BM  . insulin aspart  0-15 Units Subcutaneous 6 times per day  . loteprednol  1 drop Left Eye QODAY  . pantoprazole (PROTONIX) IV  40 mg Intravenous Q24H   Continuous Infusions: . dextrose 5 % and 0.9% NaCl 1,000 mL with potassium chloride 40 mEq infusion 75 mL/hr at 01/06/15 0812  . lactated ringers Stopped (01/06/15 1625)    Principal Problem:   Gastric outlet obstruction Active Problems:   COPD with emphysema   Colon adenocarcinoma   Diabetes mellitus   Metastasis from malignant tumor of colon   Protein-calorie malnutrition, severe   Gastric outflow obstruction   Esophagitis    Time spent: 88 minutes    Sylvie Mifsud M.D. [facility] Pager [phone]. If 7PM-7AM, please contact night-coverage at [email or web address], password [facility] 01/06/2015, 6:13 PM  LOS: 6 days

## 2015-01-06 NOTE — Interval H&P Note (Signed)
History and Physical Interval Note:  01/06/2015 2:38 PM  [name]  has presented today for surgery, with the diagnosis of gastric outlet obstruction  The various methods of treatment have been discussed with the patient and family. After consideration of risks, benefits and other options for treatment, the patient has consented to  Procedure(s): ESOPHAGOGASTRODUODENOSCOPY (EGD)  with gastric/duodenal stent (N/A) DUODENAL STENT PLACEMENT (N/A) as a surgical intervention .  The patient's history has been reviewed, patient examined, no change in status, stable for surgery.  I have reviewed the patient's chart and labs.  Questions were answered to the patient's satisfaction.    The recent H&P (dated *01/05/15**) was reviewed, the patient was examined and there is no change in the patients condition since that H&P was completed.   [name]  01/06/2015, 2:38 PM    [name]

## 2015-01-06 NOTE — Anesthesia Postprocedure Evaluation (Signed)
  Anesthesia Post Note  Patient: [name]  Procedure(s) Performed: Procedure(s) (LRB): ESOPHAGOGASTRODUODENOSCOPY (EGD)  with gastric/duodenal stent (N/A) DUODENAL STENT PLACEMENT (N/A)  Anesthesia type: MAC  Patient location: PACU  Post pain: Pain level controlled  Post assessment: Post-op Vital signs reviewed  Last Vitals:  Filed Vitals:   01/06/15 1520  BP: 151/68  Pulse: 78  Temp:   Resp: 16    Post vital signs: Reviewed  Level of consciousness: sedated  Complications: No apparent anesthesia complications

## 2015-01-07 ENCOUNTER — Encounter (HOSPITAL_COMMUNITY): Payer: Self-pay | Admitting: Gastroenterology

## 2015-01-07 LAB — CBC
HCT: 37.6 % — ABNORMAL LOW (ref 39.0–52.0)
Hemoglobin: 12 g/dL — ABNORMAL LOW (ref 13.0–17.0)
MCH: 26.4 pg (ref 26.0–34.0)
MCHC: 31.9 g/dL (ref 30.0–36.0)
MCV: 82.6 fL (ref 78.0–100.0)
Platelets: 219 K/uL (ref 150–400)
RBC: 4.55 MIL/uL (ref 4.22–5.81)
RDW: 15.6 % — ABNORMAL HIGH (ref 11.5–15.5)
WBC: 9.7 K/uL (ref 4.0–10.5)

## 2015-01-07 LAB — GLUCOSE, CAPILLARY
GLUCOSE-CAPILLARY: 105 mg/dL — AB (ref 70–99)
GLUCOSE-CAPILLARY: 193 mg/dL — AB (ref 70–99)
Glucose-Capillary: 101 mg/dL — ABNORMAL HIGH (ref 70–99)
Glucose-Capillary: 103 mg/dL — ABNORMAL HIGH (ref 70–99)
Glucose-Capillary: 105 mg/dL — ABNORMAL HIGH (ref 70–99)
Glucose-Capillary: 113 mg/dL — ABNORMAL HIGH (ref 70–99)
Glucose-Capillary: 124 mg/dL — ABNORMAL HIGH (ref 70–99)
Glucose-Capillary: 97 mg/dL (ref 70–99)

## 2015-01-07 LAB — BASIC METABOLIC PANEL WITH GFR
Anion gap: 7 (ref 5–15)
BUN: 7 mg/dL (ref 6–23)
CO2: 26 mmol/L (ref 19–32)
Calcium: 9.4 mg/dL (ref 8.4–10.5)
Chloride: 104 meq/L (ref 96–112)
Creatinine, Ser: 0.76 mg/dL (ref 0.50–1.35)
GFR calc Af Amer: >90 mL/min
GFR calc non Af Amer: 88 mL/min — ABNORMAL LOW
Glucose, Bld: 115 mg/dL — ABNORMAL HIGH (ref 70–99)
Potassium: 3.9 mmol/L (ref 3.5–5.1)
Sodium: 137 mmol/L (ref 135–145)

## 2015-01-07 LAB — MAGNESIUM: Magnesium: 1.6 mg/dL (ref 1.5–2.5)

## 2015-01-07 MED ORDER — MAGNESIUM SULFATE 50 % IJ SOLN
3.0000 g | Freq: Once | INTRAMUSCULAR | Status: AC
  Administered 2015-01-07: 3 g via INTRAVENOUS
  Filled 2015-01-07: qty 6

## 2015-01-07 MED ORDER — HEPARIN SOD (PORK) LOCK FLUSH 100 UNIT/ML IV SOLN
500.0000 [IU] | INTRAVENOUS | Status: DC | PRN
  Filled 2015-01-07: qty 5

## 2015-01-07 MED ORDER — BOOST / RESOURCE BREEZE PO LIQD: 1.0000 | Freq: Three times a day (TID) | ORAL | Status: DC

## 2015-01-07 NOTE — Progress Notes (Signed)
Patient ID: [name], male   DOB: January 15, 1941, 74 y.o.   MRN: [id number]  [facility] Progress Note  Subjective: Doing well- waiting for breakfast- no nausea or vomiting  Objective:  Vital signs in last 24 hours: Temp:  [97.8 F (36.6 C)-98 F (36.7 C)] 97.9 F (36.6 C) (01/15 0805) Pulse Rate:  [68-82] 78 (01/15 0805) Resp:  [16-27] 18 (01/15 0805) BP: (131-163)/(63-72) 131/63 mmHg (01/15 0805) SpO2:  [86 %-100 %] 93 % (01/15 0805) Last BM Date: 01/06/15 General:   Alert,  Well-developed, WM   in NAD Heart:  Regular rate and rhythm; no murmurs Pulm;clear Abdomen:  Soft, nontender and nondistended. Normal bowel sounds, Extremities:  Without edema. Neurologic:  Alert and  oriented x4;  grossly normal neurologically. Psych:  Alert and cooperative. Normal mood and affect.  Intake/Output from previous day: 01/14 0701 - 01/15 0700 In: 1737.4 [P.O.:170; I.V.:1567.4] Out: -  Intake/Output this shift:    Lab Results:  Recent Labs  01/05/15 0900 01/06/15 0530 01/07/15 0458  WBC 8.7 8.6 9.7  HGB 11.6* 11.4* 12.0*  HCT 37.7* 36.9* 37.6*  PLT 215 208 219   BMET  Recent Labs  01/05/15 0900 01/06/15 0530 01/07/15 0458  NA 141 133* 137  K 3.6 3.2* 3.9  CL 107 103 104  CO2 28 26 26   GLUCOSE 120* 107* 115*  BUN 11 10 7   CREATININE 0.77 0.79 0.76  CALCIUM 9.3 8.4 9.4     Assessment / Plan: #1  74 yo male with gastric outlet obstruction secondary to infiltrating metastatic tumor of distal gastric antrum/pylorus . He is stable and improved post stent x 2 to stricture areas  Ok from GI standpoint for D/C home today- discussed diet again with him- needs to stay on full liquid diet with supplements over  Weekend- then if tolerating can advance to very soft- meats must be ground and no raw vegetables etc.  He will not be able to get back to regular diet as lumen is still narrow and stents can get blocked  He has follow up with Dr Benay Spice on Monday  Follow  up with Feb 9 th  -Dr Deatra Ina  At 2:30 pm Principal Problem:   Gastric outlet obstruction Active Problems:   COPD with emphysema   Colon adenocarcinoma   Diabetes mellitus   Metastasis from malignant tumor of colon   Protein-calorie malnutrition, severe   Gastric outflow obstruction   Esophagitis     LOS: 7 days   Amy Esterwood  01/07/2015, 9:22 AM   GI Attending Note  I have personally taken an interval history, reviewed the chart, and examined the patient.  I agree with the extender's note, impression and recommendations.  Sandy Salaam. Deatra Ina, MD, [facility] [phone]

## 2015-01-07 NOTE — Care Management Note (Signed)
CARE MANAGEMENT NOTE 01/07/2015  Patient:  [name], [name]   Account Number:  [id number]  Date Initiated:  01/05/2015  Documentation initiated by:  Marney Doctor  Subjective/Objective Assessment:   74 yo admitted with Gastric Outlet Obstruction     Action/Plan:   From home with spouse   Anticipated DC Date:  01/07/2015   Anticipated DC Plan:  [place]  CM consult      Choice offered to / List presented to:  C-1 Patient           Status of service:  In process, will continue to follow Medicare Important Message given?  YES (If response is "NO", the following Medicare IM given date fields will be blank) Date Medicare IM given:  01/07/2015 Medicare IM given by:  Marney Doctor Date Additional Medicare IM given:   Additional Medicare IM given by:    Discharge Disposition:    Per UR Regulation:  Reviewed for med. necessity/level of care/duration of stay  If discussed at [place] of Stay Meetings, dates discussed:   01/06/2015    Comments:  01/07/15 Marney Doctor RN,BSN,NCM Checked back in with pt about HHPT.  Pt declines [facility] services at this time.  Pt appreciative of CM involvement.  01/05/15 Marney Doctor RN,BSN,NCM [phone] Met with pt about DC needs.  PT is recommending HHPT at DC. Pt is unsure if he feels like he needs it at home.  This CM gave pt list of home health providers and told him to think about it and this CM will check back in with him. No other DC needs noted at this time.

## 2015-01-07 NOTE — Discharge Summary (Signed)
Physician Discharge Summary  MARSHAUN LORTIE LGX:[id number] DOB: [birth year] DOA: 12/31/2014  PCP: Jerlyn Ly, MD  Admit date: 12/31/2014 Discharge date: 01/07/2015  Time spent: 70 minutes  Recommendations for Outpatient Follow-up:  1. Follow-up with Dr. Benay Spice of hematology oncology as scheduled on 01/10/2015. 2. Follow-up with Dr. Deatra Ina of gastroenterology on 02/01/2015 at 2:30 PM.  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 male  With a hx of copd, metastatic colon and gastric cancer followed by Dr. Benay Spice who presented from clinic with gradually increasing abd distension and abd pain relieved by belching. Pt underwent UGI series on the morning of admission and was found to have near complete gastric outlet obstruction. Gastroenterology was notified with recommendations for admission to the hospitalist service for NG placement and ultimately likely EGD and gastric outlet stent placement.  When seen, pt denied abd. Does report flatus and bowel movements.    Hospital Course:  #1 gastric outlet obstruction Patient had presented with abdominal pain and belching. Upper GI series which was done on the day of admission showed near complete gastric outlet obstruction. Patient was admitted for further evaluation and workup. GI was consulted and patient seen by Dr. Fuller Plan and followed throughout the hospitalization by gastroenterology. NG tube was initially placed, with some clinical improvement. It was felt patient's  gastric outlet obstruction was secondary to metastatic colon cancer with infiltrating tumor of the gastric antrum status post stent placement. NG tube was subsequently discontinued. Patient with some clinical improvement. Patient underwent stent placement with EGD on 01/04/2015 per Dr. Deatra Ina. Upper GI series done postprocedure with extrinsic narrowing in the second portion of the duodenum. Patient subsequently underwent a second endoscopy with stent placement to this area to ensure patency per GI. Patient was subsequently started on a full liquid diet which he tolerated. It was felt per GI that patient was stable from a GI standpoint for discharge. It was recommended that patient be discharged on a full liquid diet with supplements over the weekend and if tolerating "advanced a very soft diet. Patient is to follow-up with his oncologist as scheduled and patient will follow-up with GI Dr. Deatra Ina on 02/01/2015. Patient will be discharged home in stable condition.  #2 gastroesophageal reflux disease/Esophagitis Patient was maintained on a PPI.  #3 COPD Stable.  #4 type 2 diabetes mellitus Hemoglobin A1c was 6.4. Patient's blood sugars were well-controlled throughout the hospitalization and patient was maintained on a sliding scale insulin.  #5 metastatic colon cancer/gastric cancer Follow-up with Dr. Benay Spice of oncology as outpatient.  #6 protein calorie malnutrition Patient was nothing by mouth through most of the hospitalization secondary to his gastric outlet obstruction and procedures performed. Once patient was started on full liquid diet patient was subsequently started on ensure supplements which he will be discharged on.  Procedures:  EGD with trans-endoscopic stent 01/04/2015 per Dr. Deatra Ina  Upper GI series 01/05/2015, 12/31/2014  Abdominal x-ray 01/04/2015  EGD with transendoscopic stent 01/06/15 Dr Deatra Ina    Consultations:  Gastroenterology:  Dr. Fuller Plan  01/01/2015    Discharge Exam: Filed Vitals:   01/07/15 0805  BP: 131/63  Pulse: 78  Temp: 97.9 F (36.6 C)  Resp: 18    General: NAD Cardiovascular: RRR Respiratory: CTAB  Discharge Instructions   Discharge Instructions    Diet general    Complete by:  As directed   FULL LIQUID DIET     Discharge instructions    Complete by:  As directed   Follow up with Dr Benay Spice as scheduled. Follow up with Dr Deatra Ina as scheduled. Full liquid diet through this weekend as directed by GI.     Increase activity slowly    Complete by:  As directed           Current Discharge Medication List    START taking these medications   Details  feeding supplement, RESOURCE BREEZE, (RESOURCE BREEZE) LIQD Take 1 Container by mouth 3 (three) times daily between meals. Refills: 0      CONTINUE these medications which have NOT CHANGED   Details  aspirin EC 81 MG tablet Take 81 mg by mouth at bedtime.    bisacodyl (DULCOLAX) 5 MG EC tablet Take 10 mg by mouth daily with breakfast.    Cholecalciferol (VITAMIN D3) 2000 UNITS TABS Take 1 tablet by mouth daily with breakfast.     diphenoxylate-atropine (LOMOTIL) 2.5-0.025 MG per tablet Take 1 tablet by mouth 4 (four) times daily as needed for diarrhea or loose stools. Up to 8 per day Qty: 30 tablet, Refills: 0    docusate sodium (COLACE) 100 MG capsule Take 200 mg by mouth daily with breakfast.    dorzolamide-timolol (COSOPT) 22.3-6.8 MG/ML ophthalmic solution Place 1 drop into the left eye 2 (two) times daily.     HYDROcodone-acetaminophen (NORCO/VICODIN) 5-325 MG per tablet Take 1-2 tablets by mouth every 4 (four) hours as needed for moderate pain. Qty: 75 tablet, Refills: 0   Associated Diagnoses: Colon cancer    ibuprofen (ADVIL,MOTRIN) 200 MG tablet Take 200 mg by mouth every 6 (six) hours as needed.    Associated Diagnoses: Colon adenocarcinoma    loperamide (IMODIUM A-D) 2 MG tablet Take by mouth as needed for diarrhea or loose  stools. Take 2 tablets at the first sign of diarrhea then take one every 2 hours. Take 2 tablets at bedtime. Stop when you have not had a loose stool in twelve hours.    loratadine (CLARITIN) 10 MG tablet Take 10 mg by mouth 2 (two) times daily.      LORazepam (ATIVAN) 0.5 MG tablet Take one to two at bedtime for sleep as needed Qty: 60 tablet, Refills: 1   Associated Diagnoses: Colon cancer metastasized to multiple sites    loteprednol (LOTEMAX) 0.5 % ophthalmic suspension Place 1 drop into the left eye every other day.     ondansetron (ZOFRAN) 8 MG tablet Take 1 tablet (8 mg total) by mouth every 12 (twelve) hours as needed for nausea. Qty: 30 tablet, Refills: 1   Associated Diagnoses: Colon adenocarcinoma; Colon cancer metastasized to multiple sites    oxyCODONE-acetaminophen (PERCOCET/ROXICET) 5-325 MG per tablet Take 1-2 tablets by mouth every 6 (six) hours as needed for severe pain. 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 Qty: 30 tablet, Refills: 3    prochlorperazine (COMPAZINE) 10 MG tablet Take 1 tablet (10 mg total) by mouth every  6 (six) hours as needed for nausea or vomiting. Qty: 30 tablet, Refills: 1    testosterone enanthate (DELATESTRYL) 200 MG/ML injection Inject 500 mg into the muscle every 21 ( twenty-one) days. Every 3 weeks currently    vitamin B-12 (CYANOCOBALAMIN) 1000 MCG tablet Take 1,000 mcg by mouth daily with breakfast.       No Known Allergies Follow-up Information    Follow up with [name], MD On 02/01/2015.   Specialty:  Gastroenterology   Why:  at 2:30 pm   Contact information:   [address] [place] [address] [phone]       Follow up with Betsy Coder, MD On 01/10/2015.   Specialty:  Oncology   Why:  F/U AS SCHEDULED   Contact information:   [place]  [address] [phone]        The results of significant diagnostics from this hospitalization (including imaging, microbiology, ancillary and laboratory) are listed below for reference.    Significant Diagnostic Studies: Dg Abd 1 View - Kub  01/06/2015   CLINICAL DATA:  Endoscopic stent placement  EXAM: DG C-ARM 61-120 MIN; ABDOMEN - 1 VIEW  TECHNIQUE: Three fluoroscopic spot images  : COMPARISON:  01/05/2015  FINDINGS: Pyloric stent again identified with very limited detail. Motion artifact degrades the images.  IMPRESSION: Pyloric stent again identified. Different positioning and magnification +motion renders comparison to prior study difficult.   Electronically Signed   By: Skipper Cliche M.D.   On: 01/06/2015 17:10   Dg Abd 1 View - Kub  01/04/2015   CLINICAL DATA:  Gastric outflow obstruction.  EXAM: ABDOMEN - 1 VIEW; DG C-ARM 1-60 MIN - NRPT [facility]  COMPARISON:  December 31, 2014.  FINDINGS: Three fluoroscopic images of the stomach were submitted for review. These demonstrate stent placement involving the distal stomach for treatment of mass.  IMPRESSION: Stent placement in distal stomach for treatment of mass.   Electronically Signed   By: Sabino Dick M.D.   On: 01/04/2015 15:38   Dg Ugi W/high Density W/kub  12/31/2014   ADDENDUM REPORT: 12/31/2014 11:57  ADDENDUM: Study discussed by telephone with PA LORI HVOZDOVIC on 12/31/2014 at 11:53 hrs.  Her office will contact the patient today and arrange appropriate followup.   Electronically Signed   By: Lars Pinks M.D.   On: 12/31/2014 11:57   12/31/2014   CLINICAL DATA:  74 year old male with distal gastric mass undergoing pretreatment evaluation. Current history of colon cancer diagnosed in 2012 and lung cancer diagnosed in 2007. Subsequent encounter.  EXAM: UPPER GI SERIES WITH KUB  TECHNIQUE: After obtaining a scout radiograph a routine upper GI series was performed using effervescent crystals and high density barium.  FLUOROSCOPY TIME:  1 min and 30  seconds  COMPARISON:  CT Abdomen and Pelvis 11/09/2014.  FINDINGS: Preprocedural scout view of the abdomen demonstrating a non obstructed bowel gas pattern. Degenerative changes in the spine. No acute osseous abnormality identified.  A double contrast study was undertaken and the patient tolerated this well and without difficulty.  No obstruction to the forward flow of contrast throughout the esophagus and into the stomach. Normal esophageal course and contour. Normal esophageal mucosal pattern.  Normal gastroesophageal junction.  At this point abundant retained gastric contents became apparent. Subsequently there is suboptimal gastric coating with contrast. Furthermore, the proximal 2/3 of the stomach are dilated, and despite observation for an additional 22 minutes, only trace contrast transit distally occurred.  The trace  distal contrast out lines and irregular filling defect in the distal stomach corresponding to the soft tissue mass demonstrated by CT Abdomen and Pelvis in November. See series 10. Only a narrow patent lumen at the gastric antrum remains currently.  The pylorus is patent. Minimal duodenum all contrast. The visible duodenum mucosa appears within normal limits.  IMPRESSION: 1. Bulky and irregular soft tissue mass in the distal stomach, corresponding to that seen on the November CT Abdomen and Pelvis, now causing near complete gastric outlet obstruction. Subsequently abundant retained gastric contents were encounter did in the more proximal stomach limiting its evaluation. 2. No esophageal abnormality. Minimal visualization of the duodenum.  Electronically Signed: By: Lars Pinks M.D. On: 12/31/2014 11:45   Dg Duanne Limerick W/water Sol Cm  01/05/2015   CLINICAL DATA:  Status post duodenal stent placement 1 day prior. Gastric outlet obstruction.  EXAM: WATER SOLUBLE UPPER GI SERIES  TECHNIQUE: Single-column upper GI series was performed using water soluble contrast.  CONTRAST:  341mL OMNIPAQUE IOHEXOL 300  MG/ML  SOLN  COMPARISON:  Upper GI of 12/31/2014.  CT of 11/09/2014.  FLUOROSCOPY TIME:  5 min and 31 seconds  FINDINGS: Initial images demonstrate retained contrast throughout the stomach and colon. The stent is identified extending from the gastric antrum into the proximal portion of the duodenum.  After ingestion of contrast, and positioning in various obliquities (primarily RPO and right-side-down), contrast is seen to enter the descending duodenum. Example series 13. There is residual narrowing distal to the stent, including on image 1 of series 5. Also image 1 of series 10. Contrast does traverse this area. No contrast extravasation to suggest perforation. Persistent component of gastric outlet obstruction as evidenced by mildly distended, contrast filled stomach.  IMPRESSION: Stent extending from the pylorus into the proximal duodenum. No evidence of perforation. Residual narrowing of the descending duodenal lumen just distal to the stent. This results in a component of residual gastric outlet obstruction.   Electronically Signed   By: Abigail Miyamoto M.D.   On: 01/05/2015 11:22   Dg C-arm 1-60 Min  01/06/2015   CLINICAL DATA:  Endoscopic stent placement  EXAM: DG C-ARM 61-120 MIN; ABDOMEN - 1 VIEW  TECHNIQUE: Three fluoroscopic spot images  : COMPARISON:  01/05/2015  FINDINGS: Pyloric stent again identified with very limited detail. Motion artifact degrades the images.  IMPRESSION: Pyloric stent again identified. Different positioning and magnification +motion renders comparison to prior study difficult.   Electronically Signed   By: Skipper Cliche M.D.   On: 01/06/2015 17:10   Dg C-arm 1-60 Min-no Report  01/04/2015   CLINICAL DATA:  Gastric outflow obstruction.  EXAM: ABDOMEN - 1 VIEW; DG C-ARM 1-60 MIN - NRPT [facility]  COMPARISON:  December 31, 2014.  FINDINGS: Three fluoroscopic images of the stomach were submitted for review. These demonstrate stent placement involving the distal stomach for treatment of  mass.  IMPRESSION: Stent placement in distal stomach for treatment of mass.   Electronically Signed   By: Sabino Dick M.D.   On: 01/04/2015 15:38    Microbiology: No results found for this or any previous visit (from the past 240 hour(s)).   Labs: Basic Metabolic Panel:  Recent Labs Lab 01/01/15 0535 01/05/15 0900 01/06/15 0530 01/07/15 0458  NA 142 141 133* 137  K 3.7 3.6 3.2* 3.9  CL 107 107 103 104  CO2 28 28 26 26   GLUCOSE 127* 120* 107* 115*  BUN 13 11 10 7   CREATININE 0.90 0.77  0.79 0.76  CALCIUM 9.6 9.3 8.4 9.4  MG  --  1.5 1.9 1.6   Liver Function Tests: No results for input(s): AST, ALT, ALKPHOS, BILITOT, PROT, ALBUMIN in the last 168 hours. No results for input(s): LIPASE, AMYLASE in the last 168 hours. No results for input(s): AMMONIA in the last 168 hours. CBC:  Recent Labs Lab 01/01/15 0535 01/05/15 0900 01/06/15 0530 01/07/15 0458  WBC 10.6* 8.7 8.6 9.7  NEUTROABS  --   --  6.7  --   HGB 11.9* 11.6* 11.4* 12.0*  HCT 39.0 37.7* 36.9* 37.6*  MCV 83.9 83.8 82.7 82.6  PLT 242 215 208 219   Cardiac Enzymes: No results for input(s): CKTOTAL, CKMB, CKMBINDEX, TROPONINI in the last 168 hours. BNP: BNP (last 3 results) No results for input(s): PROBNP in the last 8760 hours. CBG:  Recent Labs Lab 01/06/15 1614 01/06/15 2011 01/07/15 0049 01/07/15 0424 01/07/15 0853  GLUCAP 87 94 103* 101* 113*       Signed:  Margeret Stachnik MD [facility] 01/07/2015, 12:05 PM

## 2015-01-07 NOTE — Progress Notes (Signed)
Nursing Discharge Summary  Patient ID: COLTER MAGOWAN MRN: [id number] DOB/AGE: 74-Jun-1942 74 y.o.  Admit date: 12/31/2014 Discharge date: 01/07/2015  Discharged Condition: good  Disposition: 01-Home or Self Care  Follow-up Information    Follow up with [name], MD On 02/01/2015.   Specialty:  Gastroenterology   Why:  at 2:30 pm   Contact information:   [address] [place] [address] [phone]       Follow up with Betsy Coder, MD On 01/10/2015.   Specialty:  Oncology   Why:  F/U AS SCHEDULED   Contact information:   [place] [address] [phone]       Prescriptions Given: No new prescriptions. Encouraged patient to get Resource Breeze to drink between meals. Discussed follow up appointments and medications. Patient and wife verbalized understanding with no further questions.   Means of Discharge: Patient taken downstairs via wheelchair to be discharged home.   Signed: Buel Ream 01/07/2015, 3:34 PM

## 2015-01-10 ENCOUNTER — Telehealth: Payer: Self-pay | Admitting: Oncology

## 2015-01-10 ENCOUNTER — Telehealth: Payer: Self-pay

## 2015-01-10 ENCOUNTER — Ambulatory Visit: Payer: Medicare Other | Admitting: Physician Assistant

## 2015-01-10 ENCOUNTER — Telehealth: Payer: Self-pay | Admitting: Nurse Practitioner

## 2015-01-10 ENCOUNTER — Ambulatory Visit: Payer: Medicare Other | Admitting: Nurse Practitioner

## 2015-01-10 NOTE — Telephone Encounter (Signed)
moved pt from [place] on 1/21 to dr Benay Spice on 1/20 per desk nurse - pof also sent. per desk nurse she has contacted pt and tx needs to be added. message to infusion manager and chemo scheduler regarding adding tx for 1/20 after f/u.

## 2015-01-10 NOTE — Telephone Encounter (Signed)
Lft msg for pt confirming new apt due to NP/LT out sick..... KJ

## 2015-01-10 NOTE — Telephone Encounter (Signed)
Called [name] to inform him his appt with Dr. Benay Spice will be on Wednesday 01/12/14 with labs before and infusion to follow. Informed [name] to show up about 8am for labs, 830 with Dr. Benay Spice, and chemo after. [name] verbalized understanding and denies any questions or concerns. POF sent for schedule adjustments.

## 2015-01-10 NOTE — Telephone Encounter (Signed)
disregard previous phone note - entered in error.

## 2015-01-12 ENCOUNTER — Ambulatory Visit (HOSPITAL_BASED_OUTPATIENT_CLINIC_OR_DEPARTMENT_OTHER): Payer: Medicare Other

## 2015-01-12 ENCOUNTER — Other Ambulatory Visit (HOSPITAL_BASED_OUTPATIENT_CLINIC_OR_DEPARTMENT_OTHER): Payer: Medicare Other | Admitting: *Deleted

## 2015-01-12 ENCOUNTER — Other Ambulatory Visit: Payer: Self-pay | Admitting: *Deleted

## 2015-01-12 ENCOUNTER — Telehealth: Payer: Self-pay | Admitting: Oncology

## 2015-01-12 ENCOUNTER — Ambulatory Visit (HOSPITAL_BASED_OUTPATIENT_CLINIC_OR_DEPARTMENT_OTHER): Payer: Medicare Other | Admitting: Oncology

## 2015-01-12 ENCOUNTER — Other Ambulatory Visit (HOSPITAL_BASED_OUTPATIENT_CLINIC_OR_DEPARTMENT_OTHER): Payer: Medicare Other

## 2015-01-12 VITALS — BP 111/64 | HR 97 | Temp 97.6°F | Resp 18 | Ht 67.0 in | Wt 139.6 lb

## 2015-01-12 DIAGNOSIS — C799 Secondary malignant neoplasm of unspecified site: Secondary | ICD-10-CM

## 2015-01-12 DIAGNOSIS — R197 Diarrhea, unspecified: Secondary | ICD-10-CM

## 2015-01-12 DIAGNOSIS — C189 Malignant neoplasm of colon, unspecified: Secondary | ICD-10-CM

## 2015-01-12 DIAGNOSIS — C185 Malignant neoplasm of splenic flexure: Secondary | ICD-10-CM

## 2015-01-12 DIAGNOSIS — D509 Iron deficiency anemia, unspecified: Secondary | ICD-10-CM

## 2015-01-12 DIAGNOSIS — E119 Type 2 diabetes mellitus without complications: Secondary | ICD-10-CM

## 2015-01-12 DIAGNOSIS — Z5111 Encounter for antineoplastic chemotherapy: Secondary | ICD-10-CM

## 2015-01-12 DIAGNOSIS — C787 Secondary malignant neoplasm of liver and intrahepatic bile duct: Secondary | ICD-10-CM

## 2015-01-12 DIAGNOSIS — N200 Calculus of kidney: Secondary | ICD-10-CM

## 2015-01-12 DIAGNOSIS — C786 Secondary malignant neoplasm of retroperitoneum and peritoneum: Secondary | ICD-10-CM

## 2015-01-12 DIAGNOSIS — R911 Solitary pulmonary nodule: Secondary | ICD-10-CM

## 2015-01-12 DIAGNOSIS — M21371 Foot drop, right foot: Secondary | ICD-10-CM

## 2015-01-12 DIAGNOSIS — G620 Drug-induced polyneuropathy: Secondary | ICD-10-CM

## 2015-01-12 DIAGNOSIS — R21 Rash and other nonspecific skin eruption: Secondary | ICD-10-CM

## 2015-01-12 DIAGNOSIS — Z5112 Encounter for antineoplastic immunotherapy: Secondary | ICD-10-CM

## 2015-01-12 LAB — COMPREHENSIVE METABOLIC PANEL (CC13)
ALT: 9 U/L (ref 0–55)
ANION GAP: 7 meq/L (ref 3–11)
AST: 13 U/L (ref 5–34)
Albumin: 2.8 g/dL — ABNORMAL LOW (ref 3.5–5.0)
Alkaline Phosphatase: 99 U/L (ref 40–150)
BILIRUBIN TOTAL: 0.38 mg/dL (ref 0.20–1.20)
BUN: 16.4 mg/dL (ref 7.0–26.0)
CO2: 29 meq/L (ref 22–29)
CREATININE: 1 mg/dL (ref 0.7–1.3)
Calcium: 9.9 mg/dL (ref 8.4–10.4)
Chloride: 103 mEq/L (ref 98–109)
EGFR: 77 mL/min/{1.73_m2} — AB (ref >90)
Glucose: 138 mg/dl (ref 70–140)
Potassium: 4.1 mEq/L (ref 3.5–5.1)
Sodium: 139 mEq/L (ref 136–145)
Total Protein: 5.9 g/dL — ABNORMAL LOW (ref 6.4–8.3)

## 2015-01-12 LAB — CBC WITH DIFFERENTIAL/PLATELET
BASO%: 0.3 % (ref 0.0–2.0)
Basophils Absolute: 0 10*3/uL (ref 0.0–0.1)
EOS%: 5.6 % (ref 0.0–7.0)
Eosinophils Absolute: 0.7 10*3/uL — ABNORMAL HIGH (ref 0.0–0.5)
HCT: 41.1 % (ref 38.4–49.9)
HGB: 12.8 g/dL — ABNORMAL LOW (ref 13.0–17.1)
LYMPH%: 6.7 % — AB (ref 14.0–49.0)
MCH: 25.4 pg — AB (ref 27.2–33.4)
MCHC: 31.1 g/dL — AB (ref 32.0–36.0)
MCV: 81.5 fL (ref 79.3–98.0)
MONO#: 0.9 10*3/uL (ref 0.1–0.9)
MONO%: 7.5 % (ref 0.0–14.0)
NEUT#: 9.6 10*3/uL — ABNORMAL HIGH (ref 1.5–6.5)
NEUT%: 79.9 % — ABNORMAL HIGH (ref 39.0–75.0)
Platelets: 286 10*3/uL (ref 140–400)
RBC: 5.04 10*6/uL (ref 4.20–5.82)
RDW: 15.5 % — ABNORMAL HIGH (ref 11.0–14.6)
WBC: 12 10*3/uL — ABNORMAL HIGH (ref 4.0–10.3)
lymph#: 0.8 10*3/uL — ABNORMAL LOW (ref 0.9–3.3)

## 2015-01-12 LAB — LACTATE DEHYDROGENASE (CC13): LDH: 160 U/L (ref 125–245)

## 2015-01-12 LAB — UA PROTEIN, DIPSTICK - CHCC: Protein, ur: <30 mg/dL

## 2015-01-12 LAB — FERRITIN CHCC: FERRITIN: 370 ng/mL — AB (ref 22–316)

## 2015-01-12 LAB — CEA: CEA: 155.3 ng/mL — AB (ref 0.0–5.0)

## 2015-01-12 MED ORDER — DEXTROSE 5 % IV SOLN
300.0000 mg/m2 | Freq: Once | INTRAVENOUS | Status: AC
  Administered 2015-01-12: 540 mg via INTRAVENOUS
  Filled 2015-01-12: qty 27

## 2015-01-12 MED ORDER — ONDANSETRON 8 MG/50ML IVPB (CHCC)
8.0000 mg | Freq: Once | INTRAVENOUS | Status: AC
  Administered 2015-01-12: 8 mg via INTRAVENOUS

## 2015-01-12 MED ORDER — OXALIPLATIN CHEMO INJECTION 100 MG/20ML
84.0000 mg/m2 | Freq: Once | INTRAVENOUS | Status: AC
  Administered 2015-01-12: 150 mg via INTRAVENOUS
  Filled 2015-01-12: qty 30

## 2015-01-12 MED ORDER — ONDANSETRON 8 MG/NS 50 ML IVPB
INTRAVENOUS | Status: AC
  Filled 2015-01-12: qty 8

## 2015-01-12 MED ORDER — SODIUM CHLORIDE 0.9 % IV SOLN
5.0000 mg/kg | Freq: Once | INTRAVENOUS | Status: AC
  Administered 2015-01-12: 350 mg via INTRAVENOUS
  Filled 2015-01-12: qty 14

## 2015-01-12 MED ORDER — DEXTROSE 5 % IV SOLN
Freq: Once | INTRAVENOUS | Status: AC
  Administered 2015-01-12: 11:00:00 via INTRAVENOUS

## 2015-01-12 MED ORDER — DEXAMETHASONE SODIUM PHOSPHATE 10 MG/ML IJ SOLN
INTRAMUSCULAR | Status: AC
  Filled 2015-01-12: qty 1

## 2015-01-12 MED ORDER — SODIUM CHLORIDE 0.9 % IV SOLN
Freq: Once | INTRAVENOUS | Status: AC
  Administered 2015-01-12: 10:00:00 via INTRAVENOUS

## 2015-01-12 MED ORDER — SODIUM CHLORIDE 0.9 % IV SOLN
1800.0000 mg/m2 | INTRAVENOUS | Status: DC
  Administered 2015-01-12: 3250 mg via INTRAVENOUS
  Filled 2015-01-12: qty 65

## 2015-01-12 MED ORDER — HEPARIN SOD (PORK) LOCK FLUSH 100 UNIT/ML IV SOLN
500.0000 [IU] | Freq: Once | INTRAVENOUS | Status: DC | PRN
  Filled 2015-01-12: qty 5

## 2015-01-12 MED ORDER — DEXAMETHASONE SODIUM PHOSPHATE 10 MG/ML IJ SOLN
10.0000 mg | Freq: Once | INTRAMUSCULAR | Status: AC
  Administered 2015-01-12: 10 mg via INTRAVENOUS

## 2015-01-12 MED ORDER — SODIUM CHLORIDE 0.9 % IJ SOLN
10.0000 mL | INTRAMUSCULAR | Status: DC | PRN
  Filled 2015-01-12: qty 10

## 2015-01-12 NOTE — Patient Instructions (Signed)
[facility] Discharge Instructions for Patients Receiving Chemotherapy  Today you received the following chemotherapy agents Oxaliplatin, Leucovorin, Fluorourcil  And Avastin  To help prevent nausea and vomiting after your treatment, we encourage you to take your nausea medication as prescribed.   If you develop nausea and vomiting that is not controlled by your nausea medication, call the clinic.   BELOW ARE SYMPTOMS THAT SHOULD BE REPORTED IMMEDIATELY:  *FEVER GREATER THAN 100.5 F  *CHILLS WITH OR WITHOUT FEVER  NAUSEA AND VOMITING THAT IS NOT CONTROLLED WITH YOUR NAUSEA MEDICATION  *UNUSUAL SHORTNESS OF BREATH  *UNUSUAL BRUISING OR BLEEDING  TENDERNESS IN MOUTH AND THROAT WITH OR WITHOUT PRESENCE OF ULCERS  *URINARY PROBLEMS  *BOWEL PROBLEMS  UNUSUAL RASH Items with * indicate a potential emergency and should be followed up as soon as possible.  Feel free to call the clinic you have any questions or concerns. The clinic phone number is [phone].

## 2015-01-12 NOTE — Progress Notes (Signed)
[facility] OFFICE PROGRESS NOTE   Diagnosis: Colon cancer  INTERVAL HISTORY:   [name] returns as scheduled. He was admitted 12/31/2014 with gastric outlet obstruction secondary to tumor. He underwent placement of a stent on 01/04/2015 to relieve obstruction from tumor at the pylorus. There was x-ray evidence of persistent obstruction distal to the stent and he was taken to a repeat endoscopy 01/06/2015 for placement of a duodenal stent extending into the junction of the second and third portions of the duodenum. [name] was discharged 01/07/2015. He reports resolution of the obstructive symptoms. He is tolerating a soft diet. He takes approximate 4 Percocet tablets per day for relief of abdominal pain. The skin rash is improving.    Objective:  Vital signs in last 24 hours:  Blood pressure 111/64, pulse 97, temperature 97.6 F (36.4 C), temperature source Oral, resp. rate 18, height _0  (1.702 m), weight 139 lb 9.6 oz (63.322 kg), SpO2 93 %.    HEENT: No thrush or ulcers Resp: Lungs clear bilaterally Cardio: Regular rate and rhythm GI: No hepatomegaly, abdominal mass superior to the umbilicus Vascular: No leg edema  Skin: Resolving panitumumab rash over the trunk   Portacath/PICC-without erythema  Lab Results:  Lab Results  Component Value Date   WBC 12.0* 01/12/2015   HGB 12.8* 01/12/2015   HCT 41.1 01/12/2015   MCV 81.5 01/12/2015   PLT 286 01/12/2015   NEUTROABS 9.6* 01/12/2015      Lab Results  Component Value Date   CEA 134.8* 12/29/2014     Medications: I have reviewed the patient's current medications.  Assessment/Plan: 1. 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  Initially diagnosed with stage III colon cancer July 2012 status post left colectomy 07/09/2011. Microsatellite stable   K-ras mutation not detected on initial testing. Extended testing returned negative for the K-ras mutation.   Adjuvant Xeloda initiated  July 2012.   Rise in the CEA tumor marker and 2 intra-abdominal soft tissue densities on CT scan November 2012 while on adjuvant Xeloda chemotherapy.   Oxaliplatin and Avastin were added to the regimen beginning 11/26/2011 with a decrease in the CEA and continued through 04/23/2012. Oxaliplatin discontinued at that time due to cumulative neurotoxicity.   Xeloda and Avastin continued.   Restaging CT evaluation 04/09/2013 showed an increase in the previously noted peritoneal implants.   FOLFIRI initiated 04/29/2013. Irinotecan dose reduced following cycle 1 due to mucositis.   Avastin added beginning with cycle 3.   Restaging CT evaluation 07/27/2013 (after 6 cycles) showed interval resolution of a previously described left lower lobe nodule. Interval improvement in peritoneal nodularity. He completed a total of 14 cycles through 11/23/2013. Treatment was adjusted to [facility] following an office visit 12/07/2013 due to a significant decline in his performance status.   Avastin placed on hold beginning 03/31/2014 due to increased urine protein. 24-hour urine on 04/02/2014 showed 814 mg of protein.   03/31/2014 CEA mildly increased at 20.3 as compared to 15.6 on 03/03/2014 and 10.4 on 02/03/2014.   Xeloda placed on hold beginning 04/14/2014 pending upcoming CT scans.   CT scan chest/abdomen/pelvis 04/29/2014 showed an enlarging stomach mass involving the anterior wall in the antral region measuring approximately 5 cm. The mass was narrowing the gastric lumen. Peritoneal implants in the left upper quadrant were noted to be slightly more enlarged.   Upper endoscopy 05/04/2014 with findings of a 5 x 5 cm mass in the gastric antrum. Biopsy showed metastatic adenocarcinoma.  Initiation of gastric radiation and concurrent Xeloda 05/25/2014; completed 06/14/2014.   Restaging CT scans chest/abdomen/pelvis 07/27/2014 with mild increase in the size of several small abdominal  peritoneal metastases and mild decrease in size of the soft tissue mass involving the anterior wall of the gastric antrum. No new sites of metastatic disease identified. No evidence of metastatic disease involving the chest. Moderate to severe emphysema.   Cycle 1 irinotecan/panitumumab 08/04/2014   Cycle 2 irinotecan/Panitumumab 08/18/2014.   Cycle 3 irinotecan/panitumumab 09/01/2014.   CEA improved 09/15/2014.   Treatment held 09/15/2014 due to diarrhea.   Cycle 4 irinotecan/Panitumumab 09/22/2014 (irinotecan dose reduced due to diarrhea).  Cycle 5 irinotecan/Panitumumab 10/06/2014.  Cycle 6 irinotecan/panitumumab 10/27/2014  Restaging CT 11/09/2014 with enlargement of a liver metastasis, increased gastric antral thickening, and slight progression of peritoneal metastases  CEA increased 11/09/2014 2. Status post Port-A-Cath placement 04/27/2013. 3. Status post resection of a stage I non-small cell lung cancer left upper lung with postop brachytherapy June 2007. 4. 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 Radiographic complete regression on Avastin based chemotherapy. 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 6. Type 2 diabetes. 7. History of nephrolithiasis. 8. Status post bilateral corneal transplants. 9. Increased urine protein on Avastin. 10. Intermittent upper abdominal pain secondary to gastric mass.  11. Delayed nausea and diarrhea following cycle 1 irinotecan/panitumumab-the anti-emetic premedication was adjusted with cycle 2. Nausea improved with cycle 2. Persistent diarrhea. 12. Rash secondary to Panitumumab. Improved 13. Weight loss. 14. Diarrhea most likely related to irinotecan. Irinotecan dose reduced beginning with cycle 4 09/22/2014. 15. Right foot drop-potentially related to spinal stenosis versus  peripheral nerve compression in the setting of weight loss. He was scheduled to undergo nerve decompression. 16. 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   Disposition:  [name] is now tolerating a diet after placement of the gastric/duodenal stents. The plan is to proceed with salvage FOLFOX/Avastin chemotherapy today. We again reviewed potential toxicities associated with this regimen including the chance for neuropathy and an allergic reaction. He agrees to proceed.  He will return for an office visit and chemotherapy in 2 weeks. [name] will continue Percocet for the abdominal pain.  Betsy Coder, MD  01/12/2015  8:51 AM

## 2015-01-12 NOTE — Telephone Encounter (Signed)
gv adn printed appt sched and avs for pt for Jan and Feb....sed added tx. °

## 2015-01-13 ENCOUNTER — Telehealth: Payer: Self-pay | Admitting: Oncology

## 2015-01-13 ENCOUNTER — Ambulatory Visit: Payer: Medicare Other | Admitting: Nurse Practitioner

## 2015-01-13 NOTE — Telephone Encounter (Signed)
returned call and s.w. pt and confirmed appt °

## 2015-01-14 ENCOUNTER — Ambulatory Visit (HOSPITAL_BASED_OUTPATIENT_CLINIC_OR_DEPARTMENT_OTHER): Payer: Medicare Other

## 2015-01-14 DIAGNOSIS — C799 Secondary malignant neoplasm of unspecified site: Principal | ICD-10-CM

## 2015-01-14 DIAGNOSIS — C786 Secondary malignant neoplasm of retroperitoneum and peritoneum: Secondary | ICD-10-CM

## 2015-01-14 DIAGNOSIS — C185 Malignant neoplasm of splenic flexure: Secondary | ICD-10-CM

## 2015-01-14 DIAGNOSIS — Z452 Encounter for adjustment and management of vascular access device: Secondary | ICD-10-CM

## 2015-01-14 DIAGNOSIS — C189 Malignant neoplasm of colon, unspecified: Secondary | ICD-10-CM

## 2015-01-14 MED ORDER — HEPARIN SOD (PORK) LOCK FLUSH 100 UNIT/ML IV SOLN
500.0000 [IU] | Freq: Once | INTRAVENOUS | Status: AC | PRN
  Administered 2015-01-14: 500 [IU]
  Filled 2015-01-14: qty 5

## 2015-01-14 MED ORDER — SODIUM CHLORIDE 0.9 % IJ SOLN
10.0000 mL | INTRAMUSCULAR | Status: DC | PRN
  Administered 2015-01-14: 10 mL
  Filled 2015-01-14: qty 10

## 2015-01-14 NOTE — Patient Instructions (Signed)

## 2015-01-17 NOTE — Telephone Encounter (Signed)
Spoke to wife and pt. They are aware of results now and plan to admit as direct admit.

## 2015-01-23 ENCOUNTER — Other Ambulatory Visit: Payer: Self-pay | Admitting: Oncology

## 2015-01-26 ENCOUNTER — Ambulatory Visit (HOSPITAL_BASED_OUTPATIENT_CLINIC_OR_DEPARTMENT_OTHER): Payer: Medicare Other

## 2015-01-26 ENCOUNTER — Telehealth: Payer: Self-pay | Admitting: Oncology

## 2015-01-26 ENCOUNTER — Other Ambulatory Visit (HOSPITAL_BASED_OUTPATIENT_CLINIC_OR_DEPARTMENT_OTHER): Payer: Medicare Other

## 2015-01-26 ENCOUNTER — Ambulatory Visit (HOSPITAL_BASED_OUTPATIENT_CLINIC_OR_DEPARTMENT_OTHER): Payer: Medicare Other | Admitting: Nurse Practitioner

## 2015-01-26 VITALS — BP 106/63 | HR 91 | Temp 97.5°F | Resp 18 | Ht 67.0 in | Wt 141.8 lb

## 2015-01-26 DIAGNOSIS — C189 Malignant neoplasm of colon, unspecified: Secondary | ICD-10-CM

## 2015-01-26 DIAGNOSIS — C787 Secondary malignant neoplasm of liver and intrahepatic bile duct: Secondary | ICD-10-CM

## 2015-01-26 DIAGNOSIS — C799 Secondary malignant neoplasm of unspecified site: Principal | ICD-10-CM

## 2015-01-26 DIAGNOSIS — C185 Malignant neoplasm of splenic flexure: Secondary | ICD-10-CM

## 2015-01-26 DIAGNOSIS — C786 Secondary malignant neoplasm of retroperitoneum and peritoneum: Secondary | ICD-10-CM

## 2015-01-26 DIAGNOSIS — Z85118 Personal history of other malignant neoplasm of bronchus and lung: Secondary | ICD-10-CM

## 2015-01-26 DIAGNOSIS — Z5111 Encounter for antineoplastic chemotherapy: Secondary | ICD-10-CM

## 2015-01-26 LAB — COMPREHENSIVE METABOLIC PANEL (CC13)
ALBUMIN: 2.8 g/dL — AB (ref 3.5–5.0)
ALT: 20 U/L (ref 0–55)
AST: 14 U/L (ref 5–34)
Alkaline Phosphatase: 102 U/L (ref 40–150)
Anion Gap: 7 mEq/L (ref 3–11)
BUN: 10.5 mg/dL (ref 7.0–26.0)
CALCIUM: 9.7 mg/dL (ref 8.4–10.4)
CHLORIDE: 105 meq/L (ref 98–109)
CO2: 28 mEq/L (ref 22–29)
Creatinine: 0.9 mg/dL (ref 0.7–1.3)
EGFR: 81 mL/min/{1.73_m2} — ABNORMAL LOW (ref >90)
Glucose: 131 mg/dl (ref 70–140)
POTASSIUM: 4.5 meq/L (ref 3.5–5.1)
Sodium: 140 mEq/L (ref 136–145)
TOTAL PROTEIN: 5.8 g/dL — AB (ref 6.4–8.3)
Total Bilirubin: 0.33 mg/dL (ref 0.20–1.20)

## 2015-01-26 LAB — CBC WITH DIFFERENTIAL/PLATELET
BASO%: 1.2 % (ref 0.0–2.0)
Basophils Absolute: 0.1 10*3/uL (ref 0.0–0.1)
EOS ABS: 0.5 10*3/uL (ref 0.0–0.5)
EOS%: 4.8 % (ref 0.0–7.0)
HEMATOCRIT: 40 % (ref 38.4–49.9)
HGB: 12.3 g/dL — ABNORMAL LOW (ref 13.0–17.1)
LYMPH%: 6.8 % — ABNORMAL LOW (ref 14.0–49.0)
MCH: 24.4 pg — ABNORMAL LOW (ref 27.2–33.4)
MCHC: 30.9 g/dL — ABNORMAL LOW (ref 32.0–36.0)
MCV: 79 fL — AB (ref 79.3–98.0)
MONO#: 0.8 10*3/uL (ref 0.1–0.9)
MONO%: 7.2 % (ref 0.0–14.0)
NEUT%: 80 % — ABNORMAL HIGH (ref 39.0–75.0)
NEUTROS ABS: 9.1 10*3/uL — AB (ref 1.5–6.5)
Platelets: 239 10*3/uL (ref 140–400)
RBC: 5.06 10*6/uL (ref 4.20–5.82)
RDW: 17.1 % — AB (ref 11.0–14.6)
WBC: 11.3 10*3/uL — ABNORMAL HIGH (ref 4.0–10.3)
lymph#: 0.8 10*3/uL — ABNORMAL LOW (ref 0.9–3.3)

## 2015-01-26 LAB — UA PROTEIN, DIPSTICK - CHCC: Protein, ur: <30 mg/dL

## 2015-01-26 MED ORDER — OXYCODONE-ACETAMINOPHEN 5-325 MG PO TABS: 1.0000 | ORAL_TABLET | Freq: Four times a day (QID) | ORAL | Status: DC | PRN

## 2015-01-26 MED ORDER — DEXAMETHASONE SODIUM PHOSPHATE 10 MG/ML IJ SOLN
INTRAMUSCULAR | Status: AC
  Filled 2015-01-26: qty 1

## 2015-01-26 MED ORDER — DEXAMETHASONE SODIUM PHOSPHATE 10 MG/ML IJ SOLN
10.0000 mg | Freq: Once | INTRAMUSCULAR | Status: AC
  Administered 2015-01-26: 10 mg via INTRAVENOUS

## 2015-01-26 MED ORDER — ONDANSETRON 8 MG/NS 50 ML IVPB
INTRAVENOUS | Status: AC
  Filled 2015-01-26: qty 8

## 2015-01-26 MED ORDER — ONDANSETRON 8 MG/50ML IVPB (CHCC)
8.0000 mg | Freq: Once | INTRAVENOUS | Status: AC
  Administered 2015-01-26: 8 mg via INTRAVENOUS

## 2015-01-26 MED ORDER — FLUOROURACIL CHEMO INJECTION 5 GM/100ML
1800.0000 mg/m2 | INTRAVENOUS | Status: DC
  Administered 2015-01-26: 3250 mg via INTRAVENOUS
  Filled 2015-01-26: qty 65

## 2015-01-26 MED ORDER — DEXTROSE 5 % IV SOLN
Freq: Once | INTRAVENOUS | Status: AC
  Administered 2015-01-26: 13:00:00 via INTRAVENOUS

## 2015-01-26 MED ORDER — LEUCOVORIN CALCIUM INJECTION 350 MG
300.0000 mg/m2 | Freq: Once | INTRAVENOUS | Status: AC
  Administered 2015-01-26: 540 mg via INTRAVENOUS
  Filled 2015-01-26: qty 27

## 2015-01-26 MED ORDER — OXALIPLATIN CHEMO INJECTION 100 MG/20ML
84.0000 mg/m2 | Freq: Once | INTRAVENOUS | Status: AC
  Administered 2015-01-26: 150 mg via INTRAVENOUS
  Filled 2015-01-26: qty 30

## 2015-01-26 NOTE — Patient Instructions (Signed)
[facility] Discharge Instructions for Patients Receiving Chemotherapy  Today you received the following chemotherapy agents: Oxaliplatin, Leucovorin and Fluorouracil.   To help prevent nausea and vomiting after your treatment, we encourage you to take your nausea medication as directed.    If you develop nausea and vomiting that is not controlled by your nausea medication, call the clinic.   BELOW ARE SYMPTOMS THAT SHOULD BE REPORTED IMMEDIATELY:  *FEVER GREATER THAN 100.5 F  *CHILLS WITH OR WITHOUT FEVER  NAUSEA AND VOMITING THAT IS NOT CONTROLLED WITH YOUR NAUSEA MEDICATION  *UNUSUAL SHORTNESS OF BREATH  *UNUSUAL BRUISING OR BLEEDING  TENDERNESS IN MOUTH AND THROAT WITH OR WITHOUT PRESENCE OF ULCERS  *URINARY PROBLEMS  *BOWEL PROBLEMS  UNUSUAL RASH Items with * indicate a potential emergency and should be followed up as soon as possible.  Feel free to call the clinic you have any questions or concerns. The clinic phone number is [phone].

## 2015-01-26 NOTE — Progress Notes (Signed)
[place] OFFICE PROGRESS NOTE   Diagnosis:  Colon cancer  INTERVAL HISTORY:   [name] returns as scheduled. He completed cycle 1 salvage FOLFOX/Avastin 01/12/2015. He denies nausea/vomiting. No mouth sores. No diarrhea. He has had some constipation which he thinks is secondary to narcotics. Bowels now moving regularly. No change in baseline numbness in the fingertips and soles of the feet. He reports he is eating better and has gained a few pounds. He thinks his pain may be slightly improved. He has noted hoarseness. He reports experiencing this when he was on oxaliplatin in the past.  Objective:  Vital signs in last 24 hours:  Blood pressure 106/63, pulse 91, temperature 97.5 F (36.4 C), temperature source Oral, resp. rate 18, height _0  (1.702 m), weight 141 lb 12.8 oz (64.32 kg), SpO2 97 %.    HEENT: No thrush or ulcers. Resp: Lungs clear bilaterally. Cardio: Regular rate and rhythm. GI: Abdomen is soft. Palpable mass measuring approximately 4 cm superior to the umbilicus. Vascular: No leg edema. Calves soft and nontender.  Port-A-Cath without erythema.    Lab Results:  Lab Results  Component Value Date   WBC 11.3* 01/26/2015   HGB 12.3* 01/26/2015   HCT 40.0 01/26/2015   MCV 79.0* 01/26/2015   PLT 239 01/26/2015   NEUTROABS 9.1* 01/26/2015    Imaging:  No results found.  Medications: I have reviewed the patient's current medications.  Assessment/Plan: 1. 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  Initially diagnosed with stage III colon cancer July 2012 status post left colectomy 07/09/2011. Microsatellite stable   K-ras mutation not detected on initial testing. Extended testing returned negative for the K-ras mutation.   Adjuvant Xeloda initiated July 2012.   Rise in the CEA tumor marker and 2 intra-abdominal soft tissue densities on CT scan November 2012 while on adjuvant Xeloda chemotherapy.   Oxaliplatin and Avastin  were added to the regimen beginning 11/26/2011 with a decrease in the CEA and continued through 04/23/2012. Oxaliplatin discontinued at that time due to cumulative neurotoxicity.   Xeloda and Avastin continued.   Restaging CT evaluation 04/09/2013 showed an increase in the previously noted peritoneal implants.   FOLFIRI initiated 04/29/2013. Irinotecan dose reduced following cycle 1 due to mucositis.   Avastin added beginning with cycle 3.   Restaging CT evaluation 07/27/2013 (after 6 cycles) showed interval resolution of a previously described left lower lobe nodule. Interval improvement in peritoneal nodularity. He completed a total of 14 cycles through 11/23/2013. Treatment was adjusted to [facility] following an office visit 12/07/2013 due to a significant decline in his performance status.   Avastin placed on hold beginning 03/31/2014 due to increased urine protein. 24-hour urine on 04/02/2014 showed 814 mg of protein.   03/31/2014 CEA mildly increased at 20.3 as compared to 15.6 on 03/03/2014 and 10.4 on 02/03/2014.   Xeloda placed on hold beginning 04/14/2014 pending upcoming CT scans.   CT scan chest/abdomen/pelvis 04/29/2014 showed an enlarging stomach mass involving the anterior wall in the antral region measuring approximately 5 cm. The mass was narrowing the gastric lumen. Peritoneal implants in the left upper quadrant were noted to be slightly more enlarged.   Upper endoscopy 05/04/2014 with findings of a 5 x 5 cm mass in the gastric antrum. Biopsy showed metastatic adenocarcinoma.   Initiation of gastric radiation and concurrent Xeloda 05/25/2014; completed 06/14/2014.   Restaging CT scans chest/abdomen/pelvis 07/27/2014 with mild increase in the size of several small abdominal peritoneal metastases and mild  decrease in size of the soft tissue mass involving the anterior wall of the gastric antrum. No new sites of metastatic disease identified. No evidence of  metastatic disease involving the chest. Moderate to severe emphysema.   Cycle 1 irinotecan/panitumumab 08/04/2014   Cycle 2 irinotecan/Panitumumab 08/18/2014.   Cycle 3 irinotecan/panitumumab 09/01/2014.   CEA improved 09/15/2014.   Treatment held 09/15/2014 due to diarrhea.   Cycle 4 irinotecan/Panitumumab 09/22/2014 (irinotecan dose reduced due to diarrhea).  Cycle 5 irinotecan/Panitumumab 10/06/2014.  Cycle 6 irinotecan/panitumumab 10/27/2014  Restaging CT 11/09/2014 with enlargement of a liver metastasis, increased gastric antral thickening, and slight progression of peritoneal metastases  CEA increased 11/09/2014  Cycle 1 FOLFOX/Avastin 01/12/2015  Cycle 2 FOLFOX 01/26/2015 (Avastin held due to an upcoming dental procedure) 2. Status post Port-A-Cath placement 04/27/2013. 3. Status post resection of a stage I non-small cell lung cancer left upper lung with postop brachytherapy June 2007. 4. 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 Radiographic complete regression on Avastin based chemotherapy. 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 6. Type 2 diabetes. 7. History of nephrolithiasis. 8. Status post bilateral corneal transplants. 9. Increased urine protein on Avastin. 10. Intermittent upper abdominal pain secondary to gastric mass.  11. Delayed nausea and diarrhea following cycle 1 irinotecan/panitumumab-the anti-emetic premedication was adjusted with cycle 2. Nausea improved with cycle 2. Persistent diarrhea. 12. Rash secondary to Panitumumab. Improved 13. Weight loss. 14. Diarrhea most likely related to irinotecan. Irinotecan dose reduced beginning with cycle 4 09/22/2014. 15. Right foot drop-potentially related to spinal stenosis versus peripheral nerve compression in the setting of weight loss. He was  scheduled to undergo nerve decompression. 16. 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   Disposition: [name] appears stable. He has completed one cycle of FOLFOX/Avastin. Plan to proceed with cycle 2 today as scheduled with the exception of Avastin which will be held due to an upcoming dental procedure. He will return for a follow-up visit and cycle 3 in 2 weeks. He will contact the office in the interim with any problems.  He was provided with a new prescription for Percocet at today's visit.    Ned Card ANP/GNP-BC   01/26/2015  12:25 PM

## 2015-01-26 NOTE — Telephone Encounter (Signed)
gv adn printed appt sched and avs for pt for Feb adn March....sed added tx....pt did not want to keep nut appt

## 2015-01-28 ENCOUNTER — Ambulatory Visit (HOSPITAL_BASED_OUTPATIENT_CLINIC_OR_DEPARTMENT_OTHER): Payer: Medicare Other

## 2015-01-28 DIAGNOSIS — C185 Malignant neoplasm of splenic flexure: Secondary | ICD-10-CM

## 2015-01-28 DIAGNOSIS — C189 Malignant neoplasm of colon, unspecified: Secondary | ICD-10-CM

## 2015-01-28 DIAGNOSIS — C786 Secondary malignant neoplasm of retroperitoneum and peritoneum: Secondary | ICD-10-CM

## 2015-01-28 DIAGNOSIS — Z452 Encounter for adjustment and management of vascular access device: Secondary | ICD-10-CM

## 2015-01-28 DIAGNOSIS — C787 Secondary malignant neoplasm of liver and intrahepatic bile duct: Secondary | ICD-10-CM

## 2015-01-28 DIAGNOSIS — C799 Secondary malignant neoplasm of unspecified site: Principal | ICD-10-CM

## 2015-01-28 MED ORDER — SODIUM CHLORIDE 0.9 % IJ SOLN
10.0000 mL | INTRAMUSCULAR | Status: DC | PRN
  Administered 2015-01-28: 10 mL
  Filled 2015-01-28: qty 10

## 2015-01-28 MED ORDER — HEPARIN SOD (PORK) LOCK FLUSH 100 UNIT/ML IV SOLN
500.0000 [IU] | Freq: Once | INTRAVENOUS | Status: AC | PRN
  Administered 2015-01-28: 500 [IU]
  Filled 2015-01-28: qty 5

## 2015-02-01 ENCOUNTER — Ambulatory Visit (INDEPENDENT_AMBULATORY_CARE_PROVIDER_SITE_OTHER): Payer: Medicare Other | Admitting: Gastroenterology

## 2015-02-01 ENCOUNTER — Encounter: Payer: Self-pay | Admitting: Gastroenterology

## 2015-02-01 VITALS — BP 112/56 | HR 72 | Ht 67.0 in | Wt 137.5 lb

## 2015-02-01 DIAGNOSIS — C189 Malignant neoplasm of colon, unspecified: Secondary | ICD-10-CM

## 2015-02-01 DIAGNOSIS — K311 Adult hypertrophic pyloric stenosis: Secondary | ICD-10-CM

## 2015-02-01 NOTE — Progress Notes (Signed)
      History of Present Illness:  [name] has returned following hospitalization for a gastric outlet obstruction.  He has widely metastatic colon cancer including metastases to the stomach causing a gastric outlet obstruction.  Sequential duodenal stents were placed through the area of obstruction with relief.  He is eating a soft diet although his appetite is fair.  He denies nausea or abdominal distention.  He has baseline abdominal pain which he attributes to his cancer.  He currently is undergoing chemotherapy.  Review of Systems: Pertinent positive and negative review of systems were noted in the above HPI section. All other review of systems were otherwise negative.    Current Medications, Allergies, Past Medical History, Past Surgical History, Family History and Social History were reviewed in [facility] record  Vital signs were reviewed in today's medical record. Physical Exam: General: Thin male in no acute distress Chest is clear No cardiac murmurs gallops or rubs On abdominal exam there is a palpable mass measuring 2 x 3 cm just superior to the umbilicus.  It is nontender.  Ears no organomegaly.  See Assessment and Plan under Problem List

## 2015-02-01 NOTE — Assessment & Plan Note (Signed)
Status post duodenal stent placement with relief of obstruction.  Patient was encouraged to add liquid supplements to insure his caloric intake.

## 2015-02-01 NOTE — Patient Instructions (Signed)
Follow up as needed

## 2015-02-01 NOTE — Assessment & Plan Note (Signed)
Therapy per oncology

## 2015-02-06 ENCOUNTER — Other Ambulatory Visit: Payer: Self-pay | Admitting: Oncology

## 2015-02-07 ENCOUNTER — Other Ambulatory Visit: Payer: Self-pay | Admitting: Internal Medicine

## 2015-02-09 ENCOUNTER — Encounter: Payer: Medicare Other | Admitting: Nutrition

## 2015-02-09 ENCOUNTER — Ambulatory Visit (HOSPITAL_BASED_OUTPATIENT_CLINIC_OR_DEPARTMENT_OTHER): Payer: Medicare Other

## 2015-02-09 ENCOUNTER — Other Ambulatory Visit: Payer: Self-pay | Admitting: *Deleted

## 2015-02-09 ENCOUNTER — Other Ambulatory Visit (HOSPITAL_BASED_OUTPATIENT_CLINIC_OR_DEPARTMENT_OTHER): Payer: Medicare Other

## 2015-02-09 ENCOUNTER — Telehealth: Payer: Self-pay | Admitting: Oncology

## 2015-02-09 ENCOUNTER — Ambulatory Visit (HOSPITAL_BASED_OUTPATIENT_CLINIC_OR_DEPARTMENT_OTHER): Payer: Medicare Other | Admitting: Oncology

## 2015-02-09 VITALS — BP 112/52 | HR 79 | Resp 18 | Ht 67.0 in | Wt 136.9 lb

## 2015-02-09 DIAGNOSIS — C185 Malignant neoplasm of splenic flexure: Secondary | ICD-10-CM

## 2015-02-09 DIAGNOSIS — C189 Malignant neoplasm of colon, unspecified: Secondary | ICD-10-CM

## 2015-02-09 DIAGNOSIS — C786 Secondary malignant neoplasm of retroperitoneum and peritoneum: Secondary | ICD-10-CM

## 2015-02-09 DIAGNOSIS — Z5111 Encounter for antineoplastic chemotherapy: Secondary | ICD-10-CM

## 2015-02-09 DIAGNOSIS — E119 Type 2 diabetes mellitus without complications: Secondary | ICD-10-CM

## 2015-02-09 DIAGNOSIS — C799 Secondary malignant neoplasm of unspecified site: Principal | ICD-10-CM

## 2015-02-09 DIAGNOSIS — C787 Secondary malignant neoplasm of liver and intrahepatic bile duct: Secondary | ICD-10-CM

## 2015-02-09 DIAGNOSIS — R197 Diarrhea, unspecified: Secondary | ICD-10-CM

## 2015-02-09 LAB — CBC WITH DIFFERENTIAL/PLATELET
BASO%: 0.3 % (ref 0.0–2.0)
BASOS ABS: 0 10*3/uL (ref 0.0–0.1)
EOS ABS: 0.4 10*3/uL (ref 0.0–0.5)
EOS%: 4.4 % (ref 0.0–7.0)
HEMATOCRIT: 39.6 % (ref 38.4–49.9)
HGB: 12.2 g/dL — ABNORMAL LOW (ref 13.0–17.1)
LYMPH%: 10.6 % — AB (ref 14.0–49.0)
MCH: 25.2 pg — ABNORMAL LOW (ref 27.2–33.4)
MCHC: 30.8 g/dL — ABNORMAL LOW (ref 32.0–36.0)
MCV: 81.8 fL (ref 79.3–98.0)
MONO#: 0.8 10*3/uL (ref 0.1–0.9)
MONO%: 8.3 % (ref 0.0–14.0)
NEUT#: 7.3 10*3/uL — ABNORMAL HIGH (ref 1.5–6.5)
NEUT%: 76.4 % — AB (ref 39.0–75.0)
PLATELETS: 169 10*3/uL (ref 140–400)
RBC: 4.84 10*6/uL (ref 4.20–5.82)
RDW: 17.1 % — ABNORMAL HIGH (ref 11.0–14.6)
WBC: 9.5 10*3/uL (ref 4.0–10.3)
lymph#: 1 10*3/uL (ref 0.9–3.3)

## 2015-02-09 LAB — COMPREHENSIVE METABOLIC PANEL (CC13)
ALK PHOS: 100 U/L (ref 40–150)
ALT: 7 U/L (ref 0–55)
AST: 11 U/L (ref 5–34)
Albumin: 3 g/dL — ABNORMAL LOW (ref 3.5–5.0)
Anion Gap: 6 mEq/L (ref 3–11)
BUN: 16.4 mg/dL (ref 7.0–26.0)
CO2: 28 mEq/L (ref 22–29)
Calcium: 9.9 mg/dL (ref 8.4–10.4)
Chloride: 105 mEq/L (ref 98–109)
Creatinine: 0.9 mg/dL (ref 0.7–1.3)
EGFR: 84 mL/min/{1.73_m2} — ABNORMAL LOW (ref >90)
Glucose: 113 mg/dl (ref 70–140)
Potassium: 4.3 mEq/L (ref 3.5–5.1)
SODIUM: 139 meq/L (ref 136–145)
TOTAL PROTEIN: 5.8 g/dL — AB (ref 6.4–8.3)
Total Bilirubin: 0.4 mg/dL (ref 0.20–1.20)

## 2015-02-09 LAB — UA PROTEIN, DIPSTICK - CHCC

## 2015-02-09 MED ORDER — FLUOROURACIL CHEMO INJECTION 5 GM/100ML
1800.0000 mg/m2 | INTRAVENOUS | Status: DC
  Administered 2015-02-09: 3250 mg via INTRAVENOUS
  Filled 2015-02-09: qty 65

## 2015-02-09 MED ORDER — ONDANSETRON 8 MG/50ML IVPB (CHCC)
8.0000 mg | Freq: Once | INTRAVENOUS | Status: AC
  Administered 2015-02-09: 8 mg via INTRAVENOUS

## 2015-02-09 MED ORDER — OXYCODONE-ACETAMINOPHEN 5-325 MG PO TABS: 1.0000 | ORAL_TABLET | Freq: Four times a day (QID) | ORAL | Status: DC | PRN

## 2015-02-09 MED ORDER — OXALIPLATIN CHEMO INJECTION 100 MG/20ML
84.0000 mg/m2 | Freq: Once | INTRAVENOUS | Status: AC
  Administered 2015-02-09: 150 mg via INTRAVENOUS
  Filled 2015-02-09: qty 30

## 2015-02-09 MED ORDER — DEXTROSE 5 % IV SOLN
Freq: Once | INTRAVENOUS | Status: AC
  Administered 2015-02-09: 12:00:00 via INTRAVENOUS

## 2015-02-09 MED ORDER — ONDANSETRON HCL 8 MG PO TABS: 8.0000 mg | ORAL_TABLET | Freq: Two times a day (BID) | ORAL | Status: DC | PRN

## 2015-02-09 MED ORDER — LEUCOVORIN CALCIUM INJECTION 350 MG
300.0000 mg/m2 | Freq: Once | INTRAVENOUS | Status: AC
  Administered 2015-02-09: 540 mg via INTRAVENOUS
  Filled 2015-02-09: qty 27

## 2015-02-09 MED ORDER — DEXAMETHASONE SODIUM PHOSPHATE 10 MG/ML IJ SOLN
10.0000 mg | Freq: Once | INTRAMUSCULAR | Status: AC
  Administered 2015-02-09: 10 mg via INTRAVENOUS

## 2015-02-09 NOTE — Patient Instructions (Signed)
[facility] Discharge Instructions for Patients Receiving Chemotherapy  Today you received the following chemotherapy agents :  Oxaliplatin,  Leucovorin,  Fluorouracil.  To help prevent nausea and vomiting after your treatment, we encourage you to take your nausea medication as prescribed by your physician.   If you develop nausea and vomiting that is not controlled by your nausea medication, call the clinic.   BELOW ARE SYMPTOMS THAT SHOULD BE REPORTED IMMEDIATELY:  *FEVER GREATER THAN 100.5 F  *CHILLS WITH OR WITHOUT FEVER  NAUSEA AND VOMITING THAT IS NOT CONTROLLED WITH YOUR NAUSEA MEDICATION  *UNUSUAL SHORTNESS OF BREATH  *UNUSUAL BRUISING OR BLEEDING  TENDERNESS IN MOUTH AND THROAT WITH OR WITHOUT PRESENCE OF ULCERS  *URINARY PROBLEMS  *BOWEL PROBLEMS  UNUSUAL RASH Items with * indicate a potential emergency and should be followed up as soon as possible.  Feel free to call the clinic you have any questions or concerns. The clinic phone number is [phone].

## 2015-02-09 NOTE — Progress Notes (Signed)
[name] OFFICE PROGRESS NOTE   Diagnosis: Colon cancer  INTERVAL HISTORY:   [name] completed a second cycle of FOLFOX on 01/26/2015. He reports malaise following chemotherapy. Cold sensitivity lasted for one week and has improved. No change in the baseline peripheral numbness. He developed constipation that improved with MiraLAX. No diarrhea. No change in the abdominal pain. He reports intermittent nausea. He is tolerating a diet.  Objective:  Vital signs in last 24 hours:  Blood pressure 112/52, pulse 79, resp. rate 18, height 5' 7"  (1.702 m), weight 136 lb 14.4 oz (62.097 kg), SpO2 99 %.    HEENT: No thrush or ulcers Resp: Lungs clear bilaterally Cardio: Regular rate and rhythm GI: No hepatomegaly, no apparent ascites,, Mass. superior to the umbilicus Vascular: No leg edema Neuro: Moderate decrease in vibratory sense at the fingertips bilaterally     Portacath/PICC-without erythema  Lab Results:  Lab Results  Component Value Date   WBC 9.5 02/09/2015   HGB 12.2* 02/09/2015   HCT 39.6 02/09/2015   MCV 81.8 02/09/2015   PLT 169 02/09/2015   NEUTROABS 7.3* 02/09/2015      Lab Results  Component Value Date   CEA 155.3* 01/12/2015    Medications: I have reviewed the patient's current medications.  Assessment/Plan: 1. 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  Initially diagnosed with stage III colon cancer July 2012 status post left colectomy 07/09/2011. Microsatellite stable   K-ras mutation not detected on initial testing. Extended testing returned negative for the K-ras mutation.   Adjuvant Xeloda initiated July 2012.   Rise in the CEA tumor marker and 2 intra-abdominal soft tissue densities on CT scan November 2012 while on adjuvant Xeloda chemotherapy.   Oxaliplatin and Avastin were added to the regimen beginning 11/26/2011 with a decrease in the CEA and continued through 04/23/2012. Oxaliplatin discontinued at that  time due to cumulative neurotoxicity.   Xeloda and Avastin continued.   Restaging CT evaluation 04/09/2013 showed an increase in the previously noted peritoneal implants.   FOLFIRI initiated 04/29/2013. Irinotecan dose reduced following cycle 1 due to mucositis.   Avastin added beginning with cycle 3.   Restaging CT evaluation 07/27/2013 (after 6 cycles) showed interval resolution of a previously described left lower lobe nodule. Interval improvement in peritoneal nodularity. He completed a total of 14 cycles through 11/23/2013. Treatment was adjusted to [facility] following an office visit 12/07/2013 due to a significant decline in his performance status.   Avastin placed on hold beginning 03/31/2014 due to increased urine protein. 24-hour urine on 04/02/2014 showed 814 mg of protein.   03/31/2014 CEA mildly increased at 20.3 as compared to 15.6 on 03/03/2014 and 10.4 on 02/03/2014.   Xeloda placed on hold beginning 04/14/2014 pending upcoming CT scans.   CT scan chest/abdomen/pelvis 04/29/2014 showed an enlarging stomach mass involving the anterior wall in the antral region measuring approximately 5 cm. The mass was narrowing the gastric lumen. Peritoneal implants in the left upper quadrant were noted to be slightly more enlarged.   Upper endoscopy 05/04/2014 with findings of a 5 x 5 cm mass in the gastric antrum. Biopsy showed metastatic adenocarcinoma.   Initiation of gastric radiation and concurrent Xeloda 05/25/2014; completed 06/14/2014.   Restaging CT scans chest/abdomen/pelvis 07/27/2014 with mild increase in the size of several small abdominal peritoneal metastases and mild decrease in size of the soft tissue mass involving the anterior wall of the gastric antrum. No new sites of metastatic disease identified. No evidence of  metastatic disease involving the chest. Moderate to severe emphysema.   Cycle 1 irinotecan/panitumumab 08/04/2014   Cycle 2  irinotecan/Panitumumab 08/18/2014.   Cycle 3 irinotecan/panitumumab 09/01/2014.   CEA improved 09/15/2014.   Treatment held 09/15/2014 due to diarrhea.   Cycle 4 irinotecan/Panitumumab 09/22/2014 (irinotecan dose reduced due to diarrhea).  Cycle 5 irinotecan/Panitumumab 10/06/2014.  Cycle 6 irinotecan/panitumumab 10/27/2014  Restaging CT 11/09/2014 with enlargement of a liver metastasis, increased gastric antral thickening, and slight progression of peritoneal metastases  CEA increased 11/09/2014  Cycle 1 FOLFOX/Avastin 01/12/2015  Cycle 2 FOLFOX 01/26/2015 (Avastin held due to an upcoming dental procedure)  Cycle 3 FOLFOX 02/09/2015 (Avastin held secondary to a planned dental procedure) 2. Status post Port-A-Cath placement 04/27/2013. 3. Status post resection of a stage I non-small cell lung cancer left upper lung with postop brachytherapy June 2007. 4. 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 Radiographic complete regression on Avastin based chemotherapy. 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 6. Type 2 diabetes. 7. History of nephrolithiasis. 8. Status post bilateral corneal transplants. 9. History of Increased urine protein on Avastin. 10. Intermittent upper abdominal pain secondary to gastric mass and abdominal masses 11. Delayed nausea and diarrhea following cycle 1 irinotecan/panitumumab-the anti-emetic premedication was adjusted with cycle 2. Nausea improved with cycle 2. Persistent diarrhea. 12. Rash secondary to Panitumumab. Improved 13. Weight loss. 14. Diarrhea most likely related to irinotecan. Irinotecan dose reduced beginning with cycle 4 09/22/2014. 15. Right foot drop-potentially related to spinal stenosis versus peripheral nerve compression in the setting of weight loss. He was scheduled to  undergo nerve decompression. 16. 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   Disposition:  He appears stable. He will complete another cycle of FOLFOX today. The plan is to complete 5 cycles of FOLFOX prior to a restaging CT evaluation. We will follow-up on the CEA from today. [name] return for an office visit and chemotherapy in 2 weeks. We will continue holding Avastin from the chemotherapy regimen.  Betsy Coder, MD  02/09/2015  11:39 AM

## 2015-02-09 NOTE — Telephone Encounter (Signed)
gv adn printed appt sched and avs for pt for Feb adn march....sed added tx.

## 2015-02-10 LAB — CEA: CEA: 110.4 ng/mL — ABNORMAL HIGH (ref 0.0–5.0)

## 2015-02-11 ENCOUNTER — Ambulatory Visit (HOSPITAL_BASED_OUTPATIENT_CLINIC_OR_DEPARTMENT_OTHER): Payer: Medicare Other

## 2015-02-11 DIAGNOSIS — C185 Malignant neoplasm of splenic flexure: Secondary | ICD-10-CM

## 2015-02-11 DIAGNOSIS — C787 Secondary malignant neoplasm of liver and intrahepatic bile duct: Secondary | ICD-10-CM

## 2015-02-11 DIAGNOSIS — Z452 Encounter for adjustment and management of vascular access device: Secondary | ICD-10-CM

## 2015-02-11 DIAGNOSIS — C189 Malignant neoplasm of colon, unspecified: Secondary | ICD-10-CM

## 2015-02-11 DIAGNOSIS — C799 Secondary malignant neoplasm of unspecified site: Principal | ICD-10-CM

## 2015-02-11 DIAGNOSIS — C786 Secondary malignant neoplasm of retroperitoneum and peritoneum: Secondary | ICD-10-CM

## 2015-02-11 MED ORDER — SODIUM CHLORIDE 0.9 % IJ SOLN
10.0000 mL | INTRAMUSCULAR | Status: DC | PRN
Start: 2015-02-11 — End: 2015-02-11
  Administered 2015-02-11: 10 mL
  Filled 2015-02-11: qty 10

## 2015-02-11 MED ORDER — HEPARIN SOD (PORK) LOCK FLUSH 100 UNIT/ML IV SOLN
500.0000 [IU] | Freq: Once | INTRAVENOUS | Status: AC | PRN
  Administered 2015-02-11: 500 [IU]
  Filled 2015-02-11: qty 5

## 2015-02-20 ENCOUNTER — Other Ambulatory Visit: Payer: Self-pay | Admitting: Oncology

## 2015-02-23 ENCOUNTER — Ambulatory Visit (HOSPITAL_BASED_OUTPATIENT_CLINIC_OR_DEPARTMENT_OTHER): Payer: Medicare Other

## 2015-02-23 ENCOUNTER — Ambulatory Visit: Payer: Medicare Other | Admitting: Nurse Practitioner

## 2015-02-23 ENCOUNTER — Encounter: Payer: Self-pay | Admitting: Nurse Practitioner

## 2015-02-23 ENCOUNTER — Other Ambulatory Visit (HOSPITAL_BASED_OUTPATIENT_CLINIC_OR_DEPARTMENT_OTHER): Payer: Medicare Other

## 2015-02-23 ENCOUNTER — Other Ambulatory Visit: Payer: Self-pay | Admitting: Nurse Practitioner

## 2015-02-23 ENCOUNTER — Encounter: Payer: Self-pay | Admitting: Oncology

## 2015-02-23 ENCOUNTER — Ambulatory Visit (HOSPITAL_BASED_OUTPATIENT_CLINIC_OR_DEPARTMENT_OTHER): Payer: Medicare Other | Admitting: Nurse Practitioner

## 2015-02-23 VITALS — BP 112/63 | HR 78 | Temp 96.5°F | Resp 18 | Ht 67.0 in | Wt 139.7 lb

## 2015-02-23 DIAGNOSIS — C189 Malignant neoplasm of colon, unspecified: Secondary | ICD-10-CM

## 2015-02-23 DIAGNOSIS — C185 Malignant neoplasm of splenic flexure: Secondary | ICD-10-CM

## 2015-02-23 DIAGNOSIS — C786 Secondary malignant neoplasm of retroperitoneum and peritoneum: Secondary | ICD-10-CM

## 2015-02-23 DIAGNOSIS — C799 Secondary malignant neoplasm of unspecified site: Principal | ICD-10-CM

## 2015-02-23 DIAGNOSIS — C787 Secondary malignant neoplasm of liver and intrahepatic bile duct: Secondary | ICD-10-CM

## 2015-02-23 DIAGNOSIS — G620 Drug-induced polyneuropathy: Secondary | ICD-10-CM

## 2015-02-23 DIAGNOSIS — Z5111 Encounter for antineoplastic chemotherapy: Secondary | ICD-10-CM

## 2015-02-23 DIAGNOSIS — E119 Type 2 diabetes mellitus without complications: Secondary | ICD-10-CM

## 2015-02-23 DIAGNOSIS — T7840XA Allergy, unspecified, initial encounter: Secondary | ICD-10-CM | POA: Insufficient documentation

## 2015-02-23 LAB — CBC WITH DIFFERENTIAL/PLATELET
BASO%: 0.6 % (ref 0.0–2.0)
Basophils Absolute: 0 10*3/uL (ref 0.0–0.1)
EOS%: 4.8 % (ref 0.0–7.0)
Eosinophils Absolute: 0.3 10*3/uL (ref 0.0–0.5)
HCT: 37.9 % — ABNORMAL LOW (ref 38.4–49.9)
HEMOGLOBIN: 11.6 g/dL — AB (ref 13.0–17.1)
LYMPH%: 9.7 % — AB (ref 14.0–49.0)
MCH: 24.5 pg — AB (ref 27.2–33.4)
MCHC: 30.6 g/dL — AB (ref 32.0–36.0)
MCV: 79.9 fL (ref 79.3–98.0)
MONO#: 0.7 10*3/uL (ref 0.1–0.9)
MONO%: 9.8 % (ref 0.0–14.0)
NEUT#: 5 10*3/uL (ref 1.5–6.5)
NEUT%: 75.1 % — ABNORMAL HIGH (ref 39.0–75.0)
PLATELETS: 190 10*3/uL (ref 140–400)
RBC: 4.74 10*6/uL (ref 4.20–5.82)
RDW: 19.6 % — ABNORMAL HIGH (ref 11.0–14.6)
WBC: 6.6 10*3/uL (ref 4.0–10.3)
lymph#: 0.6 10*3/uL — ABNORMAL LOW (ref 0.9–3.3)

## 2015-02-23 LAB — COMPREHENSIVE METABOLIC PANEL (CC13)
ALBUMIN: 3 g/dL — AB (ref 3.5–5.0)
ALT: 6 U/L (ref 0–55)
ANION GAP: 11 meq/L (ref 3–11)
AST: 12 U/L (ref 5–34)
Alkaline Phosphatase: 102 U/L (ref 40–150)
BILIRUBIN TOTAL: 0.44 mg/dL (ref 0.20–1.20)
BUN: 11.9 mg/dL (ref 7.0–26.0)
CHLORIDE: 104 meq/L (ref 98–109)
CO2: 25 meq/L (ref 22–29)
Calcium: 9.5 mg/dL (ref 8.4–10.4)
Creatinine: 0.9 mg/dL (ref 0.7–1.3)
EGFR: 85 mL/min/{1.73_m2} — ABNORMAL LOW (ref >90)
GLUCOSE: 126 mg/dL (ref 70–140)
POTASSIUM: 4 meq/L (ref 3.5–5.1)
SODIUM: 140 meq/L (ref 136–145)
TOTAL PROTEIN: 5.9 g/dL — AB (ref 6.4–8.3)

## 2015-02-23 MED ORDER — OXYCODONE-ACETAMINOPHEN 5-325 MG PO TABS: 1.0000 | ORAL_TABLET | Freq: Four times a day (QID) | ORAL | Status: DC | PRN

## 2015-02-23 MED ORDER — SODIUM CHLORIDE 0.9 % IV SOLN
1800.0000 mg/m2 | INTRAVENOUS | Status: DC
  Administered 2015-02-23: 3250 mg via INTRAVENOUS
  Filled 2015-02-23: qty 65

## 2015-02-23 MED ORDER — MEPERIDINE HCL 25 MG/ML IJ SOLN
12.5000 mg | Freq: Once | INTRAMUSCULAR | Status: AC
  Administered 2015-02-23: 12.5 mg via INTRAVENOUS

## 2015-02-23 MED ORDER — DEXTROSE 5 % IV SOLN
Freq: Once | INTRAVENOUS | Status: AC
  Administered 2015-02-23: 13:00:00 via INTRAVENOUS

## 2015-02-23 MED ORDER — LEUCOVORIN CALCIUM INJECTION 350 MG
300.0000 mg/m2 | Freq: Once | INTRAVENOUS | Status: AC
  Administered 2015-02-23: 540 mg via INTRAVENOUS
  Filled 2015-02-23: qty 27

## 2015-02-23 MED ORDER — ONDANSETRON 8 MG/NS 50 ML IVPB
INTRAVENOUS | Status: AC
  Filled 2015-02-23: qty 8

## 2015-02-23 MED ORDER — DIPHENHYDRAMINE HCL 50 MG/ML IJ SOLN
25.0000 mg | Freq: Once | INTRAMUSCULAR | Status: AC
  Administered 2015-02-23: 25 mg via INTRAVENOUS

## 2015-02-23 MED ORDER — OXALIPLATIN CHEMO INJECTION 100 MG/20ML
83.0000 mg/m2 | Freq: Once | INTRAVENOUS | Status: AC
  Administered 2015-02-23: 150 mg via INTRAVENOUS
  Filled 2015-02-23: qty 30

## 2015-02-23 MED ORDER — MEPERIDINE HCL 25 MG/ML IJ SOLN
INTRAMUSCULAR | Status: AC
  Filled 2015-02-23: qty 1

## 2015-02-23 MED ORDER — DEXAMETHASONE SODIUM PHOSPHATE 10 MG/ML IJ SOLN
INTRAMUSCULAR | Status: AC
  Filled 2015-02-23: qty 1

## 2015-02-23 MED ORDER — DEXAMETHASONE SODIUM PHOSPHATE 10 MG/ML IJ SOLN
10.0000 mg | Freq: Once | INTRAMUSCULAR | Status: AC
  Administered 2015-02-23: 10 mg via INTRAVENOUS

## 2015-02-23 MED ORDER — ONDANSETRON 8 MG/50ML IVPB (CHCC)
8.0000 mg | Freq: Once | INTRAVENOUS | Status: AC
  Administered 2015-02-23: 8 mg via INTRAVENOUS

## 2015-02-23 MED ORDER — METHYLPREDNISOLONE SODIUM SUCC 125 MG IJ SOLR
125.0000 mg | Freq: Once | INTRAMUSCULAR | Status: AC
  Administered 2015-02-23: 125 mg via INTRAVENOUS

## 2015-02-23 NOTE — Progress Notes (Signed)
SYMPTOM MANAGEMENT CLINIC   HPI: [name] 74 y.o. male diagnosed with colon cancer.  Currently undergoing FOLFOX chemotherapy regimen.  Patient presented to the [facility] today to receive cycle 4 of his FOLFOX chemotherapy regimen.  He had received approximate half of the oxaliplatin portion of his chemotherapy; when he developed a hypersensitivity reaction.  Hypersensitivity reaction was managed per hypersensitivity protocol; and patient was able to complete all of his chemotherapy today.   HPI  ROS  Past Medical History  Diagnosis Date  . COPD (chronic obstructive pulmonary disease)   . Nephrolithiasis   . Diverticulosis   . Hx of adenomatous colonic polyps 04/30/07  . Glaucoma   . Hyperlipidemia   . ED (erectile dysfunction)   . Lung nodule   . Hepatic steatosis   . Thoracic spondylosis   . SOB (shortness of breath)   . Bruises easily   . Hearing loss     slight  . Contact lens/glasses fitting   . Rectal bleeding   . Hemorrhoids   . Glaucoma   . 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 Anemia   . History of radiation therapy 05/25/14-06/14/14    gastric mets 37.5Gy/  . Non-small cell lung cancer   . Colon cancer   . 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 Diabetes mellitus     type II    Past Surgical History  Procedure Laterality Date  . 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 Lung removal, partial  05/2006    Lt upper lobe wedge resection  . Tonsillectomy    . Kidney stone removal  07/2006  . Colonoscopy    . Polypectomy    . Colon surgery  06/2011  . 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 Trigger finger release  08/29/2012    Procedure: RELEASE TRIGGER FINGER/A-1 PULLEY;  Surgeon: Cammie Sickle., MD;  Location: [facility];  Service: Orthopedics;  Laterality: Left;  release a1 pulley left long  . Radioactive seed implantation Bilateral 05/28/2006    40seeds, lung left upper   . Superficial  peroneal nerve release    . Mouth surgery    . Esophagogastroduodenoscopy (egd) with propofol N/A 01/04/2015    Procedure: ESOPHAGOGASTRODUODENOSCOPY (EGD) WITH PROPOFOL with gastric stent;  Surgeon: Inda Castle, MD;  Location: [facility];  Service: Endoscopy;  Laterality: N/A;  . Duodenal stent placement N/A 01/04/2015    Procedure: DUODENAL STENT PLACEMENT;  Surgeon: Inda Castle, MD;  Location: [facility];  Service: Endoscopy;  Laterality: N/A;  . Esophagogastroduodenoscopy N/A 01/06/2015    Procedure: ESOPHAGOGASTRODUODENOSCOPY (EGD)  with gastric/duodenal stent;  Surgeon: Inda Castle, MD;  Location: [facility];  Service: Endoscopy;  Laterality: N/A;  . Duodenal stent placement N/A 01/06/2015    Procedure: DUODENAL STENT PLACEMENT;  Surgeon: Inda Castle, MD;  Location: [facility];  Service: Endoscopy;  Laterality: N/A;    has NEOPLASM, MALIGNANT, LUNG, NON-SMALL CELL; ALLERGIC RHINITIS; COPD with emphysema; Hypoxemia; Iron deficiency anemia due to chronic blood loss; Personal history of colonic polyps; Colon adenocarcinoma; Diabetes mellitus; Contact lens/glasses fitting; Neuropathy due to chemotherapeutic drug; Dyspnea; Metastasis from malignant tumor of colon; Gastric outlet obstruction; Protein-calorie malnutrition, severe; Gastric outflow obstruction; Esophagitis; and Hypersensitivity reaction on his problem list.    has No Known Allergies.    Medication List       This list is  accurate as of: 02/23/15  5:38 PM.  Always use your most recent med list.               aspirin EC 81 MG tablet  Take 81 mg by mouth at bedtime.     bisacodyl 5 MG EC tablet  Commonly known as:  DULCOLAX  Take 10 mg by mouth daily with breakfast.     diphenoxylate-atropine 2.5-0.025 MG per tablet  Commonly known as:  LOMOTIL  Take 1 tablet by mouth 4 (four) times daily as needed for diarrhea or loose stools. Up to 8 per day     docusate sodium 100 MG capsule  Commonly known as:   COLACE  Take 200 mg by mouth daily with breakfast.     dorzolamide-timolol 22.3-6.8 MG/ML ophthalmic solution  Commonly known as:  COSOPT  Place 1 drop into the left eye 2 (two) times daily.     HYDROcodone-acetaminophen 5-325 MG per tablet  Commonly known as:  NORCO/VICODIN  Take 1-2 tablets by mouth every 4 (four) hours as needed for moderate pain.     ibuprofen 200 MG tablet  Commonly known as:  ADVIL,MOTRIN  Take 200 mg by mouth every 6 (six) hours as needed.     loperamide 2 MG tablet  Commonly known as:  IMODIUM A-D  Take by mouth as needed for diarrhea or loose stools. Take 2 tablets at the first sign of diarrhea then take one every 2 hours. Take 2 tablets at bedtime. Stop when you have not had a loose stool in twelve hours.     loratadine 10 MG tablet  Commonly known as:  CLARITIN  Take 10 mg by mouth 2 (two) times daily.     LORazepam 0.5 MG tablet  Commonly known as:  ATIVAN  Take one to two at bedtime for sleep as needed     loteprednol 0.5 % ophthalmic suspension  Commonly known as:  LOTEMAX  Place 1 drop into the left eye every other day.     ondansetron 8 MG tablet  Commonly known as:  ZOFRAN  Take 1 tablet (8 mg total) by mouth every 12 (twelve) hours as needed for nausea.     oxyCODONE-acetaminophen 5-325 MG per tablet  Commonly known as:  PERCOCET/ROXICET  Take 1-2 tablets by mouth every 6 (six) hours as needed for severe pain.     pantoprazole 40 MG tablet  Commonly known as:  PROTONIX  TAKE 1 TABLET ONCE DAILY.     polyethylene glycol packet  Commonly known as:  MIRALAX / GLYCOLAX  Take 17 g by mouth daily.     prochlorperazine 10 MG tablet  Commonly known as:  COMPAZINE  Take 1 tablet (10 mg total) by mouth every 6 (six) hours as needed for nausea or vomiting.     testosterone enanthate 200 MG/ML injection  Commonly known as:  DELATESTRYL  Inject 300 mg into the muscle every 21 ( twenty-one) days. Every 3 weeks currently     vitamin B-12 1000  MCG tablet  Commonly known as:  CYANOCOBALAMIN  Take 1,000 mcg by mouth daily with breakfast.     Vitamin D3 2000 UNITS Tabs  Take 1 tablet by mouth daily with breakfast.         PHYSICAL EXAMINATION  Vitals: 112/63, HR 78, temp 96.5. 99%   Physical Exam  Constitutional: He is oriented to person, place, and time. Vital signs are normal. He appears unhealthy.  Patient appears fatigued and chronically ill.  HENT:  Head: Normocephalic and atraumatic.  Mouth/Throat: Oropharynx is clear and moist.  Eyes: Conjunctivae and EOM are normal. Pupils are equal, round, and reactive to light.  Neck: Normal range of motion. Neck supple. No JVD present. No tracheal deviation present. No thyromegaly present.  Cardiovascular: Normal rate, regular rhythm, normal heart sounds and intact distal pulses.   Pulmonary/Chest: Effort normal and breath sounds normal. No stridor. No respiratory distress. He has no wheezes. He has no rales. He exhibits no tenderness.  Abdominal: Soft. Bowel sounds are normal. He exhibits no distension and no mass. There is no tenderness. There is no rebound and no guarding.  Musculoskeletal: Normal range of motion. He exhibits no edema or tenderness.  Lymphadenopathy:    He has no cervical adenopathy.  Neurological: He is alert and oriented to person, place, and time.  Skin: Skin is warm and dry. No rash noted. No erythema. There is pallor.  Psychiatric: Affect normal.  Nursing note and vitals reviewed.   LABORATORY DATA:. Appointment on 02/23/2015  Component Date Value Ref Range Status  . WBC 02/23/2015 6.6  4.0 - 10.3 10e3/uL Final  . NEUT# 02/23/2015 5.0  1.5 - 6.5 10e3/uL Final  . HGB 02/23/2015 11.6* 13.0 - 17.1 g/dL Final  . HCT 02/23/2015 37.9* 38.4 - 49.9 % Final  . Platelets 02/23/2015 190  140 - 400 10e3/uL Final  . MCV 02/23/2015 79.9  79.3 - 98.0 fL Final  . MCH 02/23/2015 24.5* 27.2 - 33.4 pg Final  . MCHC 02/23/2015 30.6* 32.0 - 36.0 g/dL Final  . RBC  02/23/2015 4.74  4.20 - 5.82 10e6/uL Final  . RDW 02/23/2015 19.6* 11.0 - 14.6 % Final  . lymph# 02/23/2015 0.6* 0.9 - 3.3 10e3/uL Final  . MONO# 02/23/2015 0.7  0.1 - 0.9 10e3/uL Final  . Eosinophils Absolute 02/23/2015 0.3  0.0 - 0.5 10e3/uL Final  . Basophils Absolute 02/23/2015 0.0  0.0 - 0.1 10e3/uL Final  . NEUT% 02/23/2015 75.1* 39.0 - 75.0 % Final  . LYMPH% 02/23/2015 9.7* 14.0 - 49.0 % Final  . MONO% 02/23/2015 9.8  0.0 - 14.0 % Final  . EOS% 02/23/2015 4.8  0.0 - 7.0 % Final  . BASO% 02/23/2015 0.6  0.0 - 2.0 % Final  . Sodium 02/23/2015 140  136 - 145 mEq/L Final  . Potassium 02/23/2015 4.0  3.5 - 5.1 mEq/L Final  . Chloride 02/23/2015 104  98 - 109 mEq/L Final  . CO2 02/23/2015 25  22 - 29 mEq/L Final  . Glucose 02/23/2015 126  70 - 140 mg/dl Final  . BUN 02/23/2015 11.9  7.0 - 26.0 mg/dL Final  . Creatinine 02/23/2015 0.9  0.7 - 1.3 mg/dL Final  . Total Bilirubin 02/23/2015 0.44  0.20 - 1.20 mg/dL Final  . Alkaline Phosphatase 02/23/2015 102  40 - 150 U/L Final  . AST 02/23/2015 12  5 - 34 U/L Final  . ALT 02/23/2015 6  0 - 55 U/L Final  . Total Protein 02/23/2015 5.9* 6.4 - 8.3 g/dL Final  . Albumin 02/23/2015 3.0* 3.5 - 5.0 g/dL Final  . Calcium 02/23/2015 9.5  8.4 - 10.4 mg/dL Final  . Anion Gap 02/23/2015 11  3 - 11 mEq/L Final  . EGFR 02/23/2015 85* >90 ml/min/1.73 m2 Final   eGFR is calculated using the CKD-EPI Creatinine Equation (2009)     RADIOGRAPHIC STUDIES: No results found.  ASSESSMENT/PLAN:    Colon adenocarcinoma Patient presented to the [facility] today to receive cycle  4 of his FOLFOX chemotherapy regimen.  We have continue to hold the Avastin for the time being.  Patient had received approximately half of the oxaliplatin portion of his chemotherapy; and developed a hypersensitivity reaction which consisted of mildly elevated blood pressure and rigors.  Oxaliplatin was held; and reaction was managed per hypersensitivity protocol medications.  All  symptoms did resolve; and patient's vital signs returned to baseline.  Was able to complete the oxaliplatin portion of his chemotherapy and proceed as previously directed.  Patient has plans to return on 03/09/2015 for cycle 5 of the same chemotherapy regimen.  He knows to call in interim with any new worries or concerns.   Hypersensitivity reaction Patient presented to the [facility] today to receive cycle 4 of his FOLFOX chemotherapy regimen.  We continue to hold the Avastin for the time being.  Patient had received approximately half of the oxaliplatin portion of his chemotherapy; when he developed a hypersensitivity reaction which consisted  mildly elevated blood pressure and rigors.  Oxaliplatin was held; and patient received sign Medrol 125 mg IV, Benadryl 25 mg, and Demerol 12.5 mg IV.  All symptoms did resolve; and patient's vital signs returned to baseline.  Patient requested to continue with the oxaliplatin infusion.  He was able to tolerate the rest of his oxaliplatin with no further difficulties.     Patient stated understanding of all instructions; and was in agreement with this plan of care. The patient knows to call the clinic with any problems, questions or concerns.   Review/collaboration with Dr. Benay Spice regarding all aspects of patient's visit today.   Total time spent with patient was 25 minutes;  with greater than 75 percent of that time spent in face to face counseling regarding patient's symptoms,  and coordination of care and follow up.  Disclaimer: This note was dictated with voice recognition software. Similar sounding words can inadvertently be transcribed and may not be corrected upon review.   Drue Second, NP 02/23/2015

## 2015-02-23 NOTE — Assessment & Plan Note (Signed)
Patient presented to the [facility] today to receive cycle 4 of his FOLFOX chemotherapy regimen.  We continue to hold the Avastin for the time being.  Patient had received approximately half of the oxaliplatin portion of his chemotherapy; when he developed a hypersensitivity reaction which consisted  mildly elevated blood pressure and rigors.  Oxaliplatin was held; and patient received sign Medrol 125 mg IV, Benadryl 25 mg, and Demerol 12.5 mg IV.  All symptoms did resolve; and patient's vital signs returned to baseline.  Patient requested to continue with the oxaliplatin infusion.  He was able to tolerate the rest of his oxaliplatin with no further difficulties.

## 2015-02-23 NOTE — Progress Notes (Signed)
Attempted to find copay assistance for Avastin.  The only foundation for this drug that pt qualified for was with [employer] but unfortunately the funding for this drug under pt's Dx was closed last week.  Left msg for pt to return my call to relay this info to him.

## 2015-02-23 NOTE — Progress Notes (Signed)
1435: Patient began to have chills/rigors. Chemotherapy infusion stopped, vital signs taken and, and Selena Lesser NP notified. NP arrived to bedside. Solumedrol 125mg , Benadryl 25 mg, and Demerol 12.5 mg given. 1500, chills/rigors subsided. Chemotherapy restarted 1520.

## 2015-02-23 NOTE — Patient Instructions (Signed)
[facility] Discharge Instructions for Patients Receiving Chemotherapy  Today you received the following chemotherapy agents FOLFOX.   To help prevent nausea and vomiting after your treatment, we encourage you to take your nausea medication as directed.    If you develop nausea and vomiting that is not controlled by your nausea medication, call the clinic.   BELOW ARE SYMPTOMS THAT SHOULD BE REPORTED IMMEDIATELY:  *FEVER GREATER THAN 100.5 F  *CHILLS WITH OR WITHOUT FEVER  NAUSEA AND VOMITING THAT IS NOT CONTROLLED WITH YOUR NAUSEA MEDICATION  *UNUSUAL SHORTNESS OF BREATH  *UNUSUAL BRUISING OR BLEEDING  TENDERNESS IN MOUTH AND THROAT WITH OR WITHOUT PRESENCE OF ULCERS  *URINARY PROBLEMS  *BOWEL PROBLEMS  UNUSUAL RASH Items with * indicate a potential emergency and should be followed up as soon as possible.  Feel free to call the clinic you have any questions or concerns. The clinic phone number is [phone].

## 2015-02-23 NOTE — Assessment & Plan Note (Signed)
Patient presented to the [facility] today to receive cycle 4 of his FOLFOX chemotherapy regimen.  We have continue to hold the Avastin for the time being.  Patient had received approximately half of the oxaliplatin portion of his chemotherapy; and developed a hypersensitivity reaction which consisted of mildly elevated blood pressure and rigors.  Oxaliplatin was held; and reaction was managed per hypersensitivity protocol medications.  All symptoms did resolve; and patient's vital signs returned to baseline.  Was able to complete the oxaliplatin portion of his chemotherapy and proceed as previously directed.  Patient has plans to return on 03/09/2015 for cycle 5 of the same chemotherapy regimen.  He knows to call in interim with any new worries or concerns.

## 2015-02-23 NOTE — Progress Notes (Signed)
[facility] OFFICE PROGRESS NOTE   Diagnosis:  Colon cancer  INTERVAL HISTORY:   [name] returns as scheduled. He completed cycle 3 FOLFOX 02/09/2015. He has occasional mild nausea relieved with Zofran. No vomiting. No mouth sores. No diarrhea. He is having regular bowel movements by taking miralax daily. No change in baseline neuropathy symptoms. Abdominal pain is unchanged. He takes one Percocet tablet about every 6 hours. No shortness of breath or chest pain. No bleeding. No leg swelling or calf pain.  Objective:  Vital signs in last 24 hours:  Blood pressure 112/63, pulse 78, temperature 96.5 F (35.8 C), temperature source Oral, resp. rate 18, height _0  (1.702 m), weight 139 lb 11.2 oz (63.368 kg).    HEENT: No thrush or ulcers. Resp: Lungs clear bilaterally. Cardio: Regular rate and rhythm. GI: Abdomen soft and nontender. No hepatomegaly. Mass superior to the umbilicus. Vascular: No leg edema. Calves soft and nontender. Port-A-Cath without erythema.  Lab Results:  Lab Results  Component Value Date   WBC 6.6 02/23/2015   HGB 11.6* 02/23/2015   HCT 37.9* 02/23/2015   MCV 79.9 02/23/2015   PLT 190 02/23/2015   NEUTROABS 5.0 02/23/2015    Imaging:  No results found.  Medications: I have reviewed the patient's current medications.  Assessment/Plan: 1. 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  Initially diagnosed with stage III colon cancer July 2012 status post left colectomy 07/09/2011. Microsatellite stable   K-ras mutation not detected on initial testing. Extended testing returned negative for the K-ras mutation.   Adjuvant Xeloda initiated July 2012.   Rise in the CEA tumor marker and 2 intra-abdominal soft tissue densities on CT scan November 2012 while on adjuvant Xeloda chemotherapy.   Oxaliplatin and Avastin were added to the regimen beginning 11/26/2011 with a decrease in the CEA and continued through 04/23/2012.  Oxaliplatin discontinued at that time due to cumulative neurotoxicity.   Xeloda and Avastin continued.   Restaging CT evaluation 04/09/2013 showed an increase in the previously noted peritoneal implants.   FOLFIRI initiated 04/29/2013. Irinotecan dose reduced following cycle 1 due to mucositis.   Avastin added beginning with cycle 3.   Restaging CT evaluation 07/27/2013 (after 6 cycles) showed interval resolution of a previously described left lower lobe nodule. Interval improvement in peritoneal nodularity. He completed a total of 14 cycles through 11/23/2013. Treatment was adjusted to [facility] following an office visit 12/07/2013 due to a significant decline in his performance status.   Avastin placed on hold beginning 03/31/2014 due to increased urine protein. 24-hour urine on 04/02/2014 showed 814 mg of protein.   03/31/2014 CEA mildly increased at 20.3 as compared to 15.6 on 03/03/2014 and 10.4 on 02/03/2014.   Xeloda placed on hold beginning 04/14/2014 pending upcoming CT scans.   CT scan chest/abdomen/pelvis 04/29/2014 showed an enlarging stomach mass involving the anterior wall in the antral region measuring approximately 5 cm. The mass was narrowing the gastric lumen. Peritoneal implants in the left upper quadrant were noted to be slightly more enlarged.   Upper endoscopy 05/04/2014 with findings of a 5 x 5 cm mass in the gastric antrum. Biopsy showed metastatic adenocarcinoma.   Initiation of gastric radiation and concurrent Xeloda 05/25/2014; completed 06/14/2014.   Restaging CT scans chest/abdomen/pelvis 07/27/2014 with mild increase in the size of several small abdominal peritoneal metastases and mild decrease in size of the soft tissue mass involving the anterior wall of the gastric antrum. No new sites of metastatic disease identified.  No evidence of metastatic disease involving the chest. Moderate to severe emphysema.   Cycle 1 irinotecan/panitumumab  08/04/2014   Cycle 2 irinotecan/Panitumumab 08/18/2014.   Cycle 3 irinotecan/panitumumab 09/01/2014.   CEA improved 09/15/2014.   Treatment held 09/15/2014 due to diarrhea.   Cycle 4 irinotecan/Panitumumab 09/22/2014 (irinotecan dose reduced due to diarrhea).  Cycle 5 irinotecan/Panitumumab 10/06/2014.  Cycle 6 irinotecan/panitumumab 10/27/2014  Restaging CT 11/09/2014 with enlargement of a liver metastasis, increased gastric antral thickening, and slight progression of peritoneal metastases  CEA increased 11/09/2014  Cycle 1 FOLFOX/Avastin 01/12/2015  Cycle 2 FOLFOX 01/26/2015 (Avastin held due to an upcoming dental procedure)  Cycle 3 FOLFOX 02/09/2015 (Avastin held secondary to a planned dental procedure)  Cycle 4 FOLFOX 02/23/2015 (Avastin held due to a recent dental procedure) 2. Status post Port-A-Cath placement 04/27/2013. 3. Status post resection of a stage I non-small cell lung cancer left upper lung with postop brachytherapy June 2007. 4. 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 Radiographic complete regression on Avastin based chemotherapy. 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 6. Type 2 diabetes. 7. History of nephrolithiasis. 8. Status post bilateral corneal transplants. 9. History of Increased urine protein on Avastin. 10. Intermittent upper abdominal pain secondary to gastric mass and abdominal masses 11. Delayed nausea and diarrhea following cycle 1 irinotecan/panitumumab-the anti-emetic premedication was adjusted with cycle 2. Nausea improved with cycle 2. Persistent diarrhea. 12. Rash secondary to Panitumumab. Improved 13. Weight loss. 14. Diarrhea most likely related to irinotecan. Irinotecan dose reduced beginning with cycle 4 09/22/2014. 15. Right foot drop-potentially related to spinal  stenosis versus peripheral nerve compression in the setting of weight loss. He was scheduled to undergo nerve decompression. 16. 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     Disposition: Mr. Miralles appears stable. He has completed 3 cycles of FOLFOX. Avastin was given with the first cycle and subsequently placed on hold due to a dental procedure. Plan to proceed with cycle 4 FOLFOX today as scheduled. We will continue to hold Avastin and reevaluate in 2 weeks. He will return for a follow-up visit and cycle 5 FOLFOX on 03/09/2015. He will contact the office in the interim with any problems.  Plan reviewed with Dr. Benay Spice.    Ned Card ANP/GNP-BC   02/23/2015  11:49 AM

## 2015-02-24 ENCOUNTER — Telehealth: Payer: Self-pay

## 2015-02-24 LAB — CEA: CEA: 116.1 ng/mL — AB (ref 0.0–5.0)

## 2015-02-24 NOTE — Telephone Encounter (Signed)
Called to follow up with pt, reaction to oxaliplatin yesterday 02/23/15. Pt states he feels much better and hasnt had any more tremors since returning home yesterday. Also informed pt that an order was sent for his CT scan and radiology should call within a couple days to get that scheduled. Pt verbalized understanding and will call with any further questions or concerns.

## 2015-02-24 NOTE — Addendum Note (Signed)
Addended by: Drue Second R on: 02/24/2015 02:49 PM   Modules accepted: Level of Service

## 2015-02-25 ENCOUNTER — Ambulatory Visit (HOSPITAL_BASED_OUTPATIENT_CLINIC_OR_DEPARTMENT_OTHER): Payer: Medicare Other

## 2015-02-25 DIAGNOSIS — C787 Secondary malignant neoplasm of liver and intrahepatic bile duct: Secondary | ICD-10-CM

## 2015-02-25 DIAGNOSIS — C189 Malignant neoplasm of colon, unspecified: Secondary | ICD-10-CM

## 2015-02-25 DIAGNOSIS — C799 Secondary malignant neoplasm of unspecified site: Principal | ICD-10-CM

## 2015-02-25 DIAGNOSIS — C185 Malignant neoplasm of splenic flexure: Secondary | ICD-10-CM

## 2015-02-25 DIAGNOSIS — C786 Secondary malignant neoplasm of retroperitoneum and peritoneum: Secondary | ICD-10-CM

## 2015-02-25 MED ORDER — HEPARIN SOD (PORK) LOCK FLUSH 100 UNIT/ML IV SOLN
500.0000 [IU] | Freq: Once | INTRAVENOUS | Status: AC | PRN
  Administered 2015-02-25: 500 [IU]
  Filled 2015-02-25: qty 5

## 2015-02-25 MED ORDER — SODIUM CHLORIDE 0.9 % IJ SOLN
10.0000 mL | INTRAMUSCULAR | Status: DC | PRN
  Administered 2015-02-25: 10 mL
  Filled 2015-02-25: qty 10

## 2015-02-25 NOTE — Patient Instructions (Signed)
Fluorouracil, 5FU; Diclofenac topical cream What is this medicine? FLUOROURACIL; DICLOFENAC (flure oh YOOR a sil; dye KLOE fen ak) is a combination of a topical chemotherapy agent and non-steroidal anti-inflammatory drug (NSAID). It is used on the skin to treat skin cancer and skin conditions that could become cancer. This medicine may be used for other purposes; ask your health care provider or pharmacist if you have questions. COMMON BRAND NAME(S): FLUORAC What should I tell my health care provider before I take this medicine? They need to know if you have any of these conditions: -bleeding problems -cigarette smoker -DPD enzyme deficiency -heart disease -high blood pressure -if you frequently drink alcohol containing drinks -kidney disease -liver disease -open or infected skin -stomach problems -swelling or open sores at the treatment site -recent or planned coronary artery bypass graft (CABG) surgery -an unusual or allergic reaction to fluorouracil, diclofenac, aspirin, other NSAIDs, other medicines, foods, dyes, or preservatives -pregnant or trying to get pregnant -breast-feeding How should I use this medicine? This medicine is only for use on the skin. Follow the directions on the prescription label. Wash hands before and after use. Wash affected area and gently pat dry. To apply this medicine use a cotton-tipped applicator, or use gloves if applying with fingertips. If applied with unprotected fingertips, it is very important to wash your hands well after you apply this medicine. Avoid applying to the eyes, nose, or mouth. Apply enough medicine to cover the affected area. You can cover the area with a light gauze dressing, but do not use tight or air-tight dressings. Finish the full course prescribed by your doctor or health care professional, even if you think your condition is better. Do not stop taking except on the advice of your doctor or health care professional. Talk to your  pediatrician regarding the use of this medicine in children. Special care may be needed. Overdosage: If you think you've taken too much of this medicine contact a poison control center or emergency room at once. Overdosage: If you think you have taken too much of this medicine contact a poison control center or emergency room at once. NOTE: This medicine is only for you. Do not share this medicine with others. What if I miss a dose? If you miss a dose, apply it as soon as you can. If it is almost time for your next dose, only use that dose. Do not apply extra doses. Contact your doctor or health care professional if you miss more than one dose. What may interact with this medicine? Interactions are not expected. Do not use any other skin products without telling your doctor or health care professional. This list may not describe all possible interactions. Give your health care provider a list of all the medicines, herbs, non-prescription drugs, or dietary supplements you use. Also tell them if you smoke, drink alcohol, or use illegal drugs. Some items may interact with your medicine. What should I watch for while using this medicine? Visit your doctor or health care professional for checks on your progress. You will need to use this medicine for 2 to 6 weeks. This may be longer depending on the condition being treated. You may not see full healing for another 1 to 2 months after you stop using the medicine. Treated areas of skin can look unsightly during and for several weeks after treatment with this medicine. This medicine can make you more sensitive to the sun. Keep out of the sun. If you cannot avoid being in  the sun, wear protective clothing and use sunscreen. Do not use sun lamps or tanning beds/booths. Where should I keep my What side effects may I notice from receiving this medicine? Side effects that you should report to your doctor or health care professional as soon as possible: -allergic  reactions like skin rash, itching or hives, swelling of the face, lips, or tongue -black or bloody stools, blood in the urine or vomit -blurred vision -chest pain -difficulty breathing or wheezing -redness, blistering, peeling or loosening of the skin, including inside the mouth -severe redness and swelling of normal skin -slurred speech or weakness on one side of the body -trouble passing urine or change in the amount of urine -unexplained weight gain or swelling -unusually weak or tired -yellowing of eyes or skin Side effects that usually do not require medical attention (Report these to your doctor or health care professional if they continue or are bothersome.): -increased sensitivity of the skin to sun and ultraviolet light -pain and burning of the affected area -scaling or swelling of the affected area -skin rash, itching of the affected area -tenderness This list may not describe all possible side effects. Call your doctor for medical advice about side effects. You may report side effects to FDA at 1-800-FDA-1088. Where should I keep my medicine? Keep out of the reach of children. Store at room temperature between 20 and 25 degrees C (68 and 77 degrees F). Throw away any unused medicine after the expiration date. NOTE: This sheet is a summary. It may not cover all possible information. If you have questions about this medicine, talk to your doctor, pharmacist, or health care provider.  2015, Elsevier/Gold Standard. (2014-04-12 11:09:58)

## 2015-02-28 ENCOUNTER — Other Ambulatory Visit (HOSPITAL_BASED_OUTPATIENT_CLINIC_OR_DEPARTMENT_OTHER): Payer: Medicare Other

## 2015-02-28 ENCOUNTER — Inpatient Hospital Stay (HOSPITAL_COMMUNITY)
Admission: EM | Admit: 2015-02-28 | Discharge: 2015-03-10 | DRG: 683 | Disposition: A | Discharge status: Home / Self Care | Payer: Medicare Other | Attending: Internal Medicine | Admitting: Internal Medicine

## 2015-02-28 ENCOUNTER — Telehealth: Payer: Self-pay | Admitting: *Deleted

## 2015-02-28 ENCOUNTER — Other Ambulatory Visit: Payer: Self-pay

## 2015-02-28 ENCOUNTER — Encounter (HOSPITAL_COMMUNITY): Payer: Self-pay | Admitting: *Deleted

## 2015-02-28 ENCOUNTER — Encounter: Payer: Self-pay | Admitting: Nurse Practitioner

## 2015-02-28 ENCOUNTER — Ambulatory Visit (HOSPITAL_BASED_OUTPATIENT_CLINIC_OR_DEPARTMENT_OTHER): Payer: Medicare Other | Admitting: Nurse Practitioner

## 2015-02-28 VITALS — BP 155/75 | HR 109 | Temp 98.2°F | Resp 16

## 2015-02-28 DIAGNOSIS — E876 Hypokalemia: Secondary | ICD-10-CM | POA: Diagnosis not present

## 2015-02-28 DIAGNOSIS — Z87891 Personal history of nicotine dependence: Secondary | ICD-10-CM | POA: Diagnosis not present

## 2015-02-28 DIAGNOSIS — R112 Nausea with vomiting, unspecified: Secondary | ICD-10-CM

## 2015-02-28 DIAGNOSIS — N178 Other acute kidney failure: Secondary | ICD-10-CM | POA: Diagnosis present

## 2015-02-28 DIAGNOSIS — Z7982 Long term (current) use of aspirin: Secondary | ICD-10-CM

## 2015-02-28 DIAGNOSIS — E119 Type 2 diabetes mellitus without complications: Secondary | ICD-10-CM | POA: Diagnosis present

## 2015-02-28 DIAGNOSIS — N179 Acute kidney failure, unspecified: Secondary | ICD-10-CM

## 2015-02-28 DIAGNOSIS — C787 Secondary malignant neoplasm of liver and intrahepatic bile duct: Secondary | ICD-10-CM | POA: Diagnosis present

## 2015-02-28 DIAGNOSIS — D6959 Other secondary thrombocytopenia: Secondary | ICD-10-CM | POA: Diagnosis present

## 2015-02-28 DIAGNOSIS — K5909 Other constipation: Secondary | ICD-10-CM | POA: Diagnosis present

## 2015-02-28 DIAGNOSIS — D696 Thrombocytopenia, unspecified: Secondary | ICD-10-CM

## 2015-02-28 DIAGNOSIS — R630 Anorexia: Secondary | ICD-10-CM

## 2015-02-28 DIAGNOSIS — Z9689 Presence of other specified functional implants: Secondary | ICD-10-CM | POA: Diagnosis present

## 2015-02-28 DIAGNOSIS — C7889 Secondary malignant neoplasm of other digestive organs: Secondary | ICD-10-CM | POA: Diagnosis present

## 2015-02-28 DIAGNOSIS — N289 Disorder of kidney and ureter, unspecified: Secondary | ICD-10-CM

## 2015-02-28 DIAGNOSIS — Z85118 Personal history of other malignant neoplasm of bronchus and lung: Secondary | ICD-10-CM

## 2015-02-28 DIAGNOSIS — T402X5A Adverse effect of other opioids, initial encounter: Secondary | ICD-10-CM | POA: Diagnosis present

## 2015-02-28 DIAGNOSIS — Z923 Personal history of irradiation: Secondary | ICD-10-CM | POA: Diagnosis not present

## 2015-02-28 DIAGNOSIS — Z800 Family history of malignant neoplasm of digestive organs: Secondary | ICD-10-CM | POA: Diagnosis not present

## 2015-02-28 DIAGNOSIS — E860 Dehydration: Secondary | ICD-10-CM

## 2015-02-28 DIAGNOSIS — R110 Nausea: Secondary | ICD-10-CM

## 2015-02-28 DIAGNOSIS — J449 Chronic obstructive pulmonary disease, unspecified: Secondary | ICD-10-CM | POA: Diagnosis present

## 2015-02-28 DIAGNOSIS — C786 Secondary malignant neoplasm of retroperitoneum and peritoneum: Secondary | ICD-10-CM

## 2015-02-28 DIAGNOSIS — Z452 Encounter for adjustment and management of vascular access device: Secondary | ICD-10-CM

## 2015-02-28 DIAGNOSIS — D649 Anemia, unspecified: Secondary | ICD-10-CM | POA: Diagnosis present

## 2015-02-28 DIAGNOSIS — C189 Malignant neoplasm of colon, unspecified: Secondary | ICD-10-CM

## 2015-02-28 DIAGNOSIS — Z825 Family history of asthma and other chronic lower respiratory diseases: Secondary | ICD-10-CM

## 2015-02-28 DIAGNOSIS — I10 Essential (primary) hypertension: Secondary | ICD-10-CM | POA: Diagnosis present

## 2015-02-28 DIAGNOSIS — R531 Weakness: Secondary | ICD-10-CM

## 2015-02-28 DIAGNOSIS — E875 Hyperkalemia: Secondary | ICD-10-CM | POA: Diagnosis present

## 2015-02-28 DIAGNOSIS — E871 Hypo-osmolality and hyponatremia: Secondary | ICD-10-CM | POA: Diagnosis present

## 2015-02-28 DIAGNOSIS — C185 Malignant neoplasm of splenic flexure: Secondary | ICD-10-CM

## 2015-02-28 DIAGNOSIS — C799 Secondary malignant neoplasm of unspecified site: Principal | ICD-10-CM

## 2015-02-28 DIAGNOSIS — Z806 Family history of leukemia: Secondary | ICD-10-CM

## 2015-02-28 DIAGNOSIS — Z801 Family history of malignant neoplasm of trachea, bronchus and lung: Secondary | ICD-10-CM | POA: Diagnosis not present

## 2015-02-28 DIAGNOSIS — E785 Hyperlipidemia, unspecified: Secondary | ICD-10-CM | POA: Diagnosis present

## 2015-02-28 DIAGNOSIS — R5383 Other fatigue: Secondary | ICD-10-CM

## 2015-02-28 DIAGNOSIS — T451X5A Adverse effect of antineoplastic and immunosuppressive drugs, initial encounter: Secondary | ICD-10-CM | POA: Diagnosis present

## 2015-02-28 DIAGNOSIS — J439 Emphysema, unspecified: Secondary | ICD-10-CM | POA: Diagnosis present

## 2015-02-28 LAB — COMPREHENSIVE METABOLIC PANEL
ALBUMIN: 3.2 g/dL — AB (ref 3.5–5.2)
ALT: 13 U/L (ref 0–53)
AST: 17 U/L (ref 0–37)
Alkaline Phosphatase: 86 U/L (ref 39–117)
Anion gap: 11 (ref 5–15)
BUN: 111 mg/dL — ABNORMAL HIGH (ref 6–23)
CO2: 21 mmol/L (ref 19–32)
Calcium: 9.1 mg/dL (ref 8.4–10.5)
Chloride: 104 mmol/L (ref 96–112)
Creatinine, Ser: 9.78 mg/dL — ABNORMAL HIGH (ref 0.50–1.35)
GFR calc Af Amer: 5 mL/min — ABNORMAL LOW (ref >90)
GFR, EST NON AFRICAN AMERICAN: 5 mL/min — AB (ref >90)
Glucose, Bld: 122 mg/dL — ABNORMAL HIGH (ref 70–99)
Potassium: >7.5 mmol/L (ref 3.5–5.1)
Sodium: 136 mmol/L (ref 135–145)
Total Bilirubin: 0.7 mg/dL (ref 0.3–1.2)
Total Protein: 5.9 g/dL — ABNORMAL LOW (ref 6.0–8.3)

## 2015-02-28 LAB — COMPREHENSIVE METABOLIC PANEL (CC13)
ALK PHOS: 96 U/L (ref 40–150)
ALT: 10 U/L (ref 0–55)
AST: 14 U/L (ref 5–34)
Albumin: 2.9 g/dL — ABNORMAL LOW (ref 3.5–5.0)
Anion Gap: 14 mEq/L — ABNORMAL HIGH (ref 3–11)
BUN: 101.9 mg/dL — AB (ref 7.0–26.0)
CO2: 19 mEq/L — ABNORMAL LOW (ref 22–29)
Calcium: 9.7 mg/dL (ref 8.4–10.4)
Chloride: 103 mEq/L (ref 98–109)
Creatinine: 9.2 mg/dL (ref 0.7–1.3)
EGFR: 5 mL/min/{1.73_m2} — AB (ref >90)
GLUCOSE: 111 mg/dL (ref 70–140)
Potassium: >9 mEq/L (ref 3.5–5.1)
Sodium: 137 mEq/L (ref 136–145)
Total Bilirubin: 0.57 mg/dL (ref 0.20–1.20)
Total Protein: 5.9 g/dL — ABNORMAL LOW (ref 6.4–8.3)

## 2015-02-28 LAB — URINALYSIS, ROUTINE W REFLEX MICROSCOPIC
Bilirubin Urine: NEGATIVE
Glucose, UA: NEGATIVE mg/dL
Ketones, ur: NEGATIVE mg/dL
Leukocytes, UA: NEGATIVE
NITRITE: NEGATIVE
PH: 7.5 (ref 5.0–8.0)
Protein, ur: NEGATIVE mg/dL
SPECIFIC GRAVITY, URINE: 1.01 (ref 1.005–1.030)
Urobilinogen, UA: 0.2 mg/dL (ref 0.0–1.0)

## 2015-02-28 LAB — CBC WITH DIFFERENTIAL/PLATELET
BASO%: 0.6 % (ref 0.0–2.0)
BASOS ABS: 0 10*3/uL (ref 0.0–0.1)
BASOS ABS: 0 10*3/uL (ref 0.0–0.1)
BASOS PCT: 0 % (ref 0–1)
EOS ABS: 0 10*3/uL (ref 0.0–0.5)
EOS%: 0.7 % (ref 0.0–7.0)
Eosinophils Absolute: 0 10*3/uL (ref 0.0–0.7)
Eosinophils Relative: 1 % (ref 0–5)
HCT: 36.5 % — ABNORMAL LOW (ref 39.0–52.0)
HEMATOCRIT: 38.2 % — AB (ref 38.4–49.9)
HEMOGLOBIN: 11.9 g/dL — AB (ref 13.0–17.1)
HEMOGLOBIN: 11.6 g/dL — AB (ref 13.0–17.0)
LYMPH#: 0.2 10*3/uL — AB (ref 0.9–3.3)
LYMPH%: 7.5 % — ABNORMAL LOW (ref 14.0–49.0)
LYMPHS PCT: 9 % — AB (ref 12–46)
Lymphs Abs: 0.3 10*3/uL — ABNORMAL LOW (ref 0.7–4.0)
MCH: 24.1 pg — AB (ref 27.2–33.4)
MCH: 24.6 pg — ABNORMAL LOW (ref 26.0–34.0)
MCHC: 31.1 g/dL — ABNORMAL LOW (ref 32.0–36.0)
MCHC: 31.8 g/dL (ref 30.0–36.0)
MCV: 77.3 fL — ABNORMAL LOW (ref 79.3–98.0)
MCV: 77.5 fL — ABNORMAL LOW (ref 78.0–100.0)
MONO#: 0.1 10*3/uL (ref 0.1–0.9)
MONO%: 2.2 % (ref 0.0–14.0)
Monocytes Absolute: 0.1 10*3/uL (ref 0.1–1.0)
Monocytes Relative: 3 % (ref 3–12)
NEUT%: 89 % — AB (ref 39.0–75.0)
NEUTROS ABS: 3 10*3/uL (ref 1.5–6.5)
NEUTROS ABS: 2.9 10*3/uL (ref 1.7–7.7)
Neutrophils Relative %: 88 % — ABNORMAL HIGH (ref 43–77)
Platelets: 60 10*3/uL — ABNORMAL LOW (ref 140–400)
Platelets: 47 10*3/uL — ABNORMAL LOW (ref 150–400)
RBC: 4.94 10*6/uL (ref 4.20–5.82)
RBC: 4.71 MIL/uL (ref 4.22–5.81)
RDW: 19.9 % — ABNORMAL HIGH (ref 11.0–14.6)
RDW: 18.5 % — AB (ref 11.5–15.5)
WBC: 3.3 10*3/uL — ABNORMAL LOW (ref 4.0–10.3)
WBC: 3.3 10*3/uL — AB (ref 4.0–10.5)

## 2015-02-28 LAB — BASIC METABOLIC PANEL
ANION GAP: 11 (ref 5–15)
BUN: 113 mg/dL — ABNORMAL HIGH (ref 6–23)
CALCIUM: 9.5 mg/dL (ref 8.4–10.5)
CO2: 19 mmol/L (ref 19–32)
CREATININE: 9.94 mg/dL — AB (ref 0.50–1.35)
Chloride: 108 mmol/L (ref 96–112)
GFR calc Af Amer: 5 mL/min — ABNORMAL LOW (ref >90)
GFR, EST NON AFRICAN AMERICAN: 4 mL/min — AB (ref >90)
Glucose, Bld: 142 mg/dL — ABNORMAL HIGH (ref 70–99)
Potassium: 7.2 mmol/L (ref 3.5–5.1)
Sodium: 138 mmol/L (ref 135–145)

## 2015-02-28 LAB — URINE MICROSCOPIC-ADD ON

## 2015-02-28 MED ORDER — INSULIN ASPART 100 UNIT/ML IV SOLN
10.0000 [IU] | Freq: Once | INTRAVENOUS | Status: AC
  Administered 2015-03-01: 10 [IU] via INTRAVENOUS

## 2015-02-28 MED ORDER — DEXTROSE 50 % IV SOLN
1.0000 | Freq: Once | INTRAVENOUS | Status: AC
  Administered 2015-02-28: 50 mL via INTRAVENOUS
  Filled 2015-02-28: qty 50

## 2015-02-28 MED ORDER — ONDANSETRON HCL 4 MG/2ML IJ SOLN
4.0000 mg | Freq: Four times a day (QID) | INTRAMUSCULAR | Status: DC | PRN
  Administered 2015-03-04 – 2015-03-05 (×2): 4 mg via INTRAVENOUS
  Filled 2015-02-28 (×2): qty 2

## 2015-02-28 MED ORDER — INSULIN ASPART 100 UNIT/ML IV SOLN
10.0000 [IU] | Freq: Once | INTRAVENOUS | Status: AC
  Administered 2015-02-28: 10 [IU] via INTRAVENOUS
  Filled 2015-02-28: qty 0.1

## 2015-02-28 MED ORDER — OXYCODONE-ACETAMINOPHEN 5-325 MG PO TABS
1.0000 | ORAL_TABLET | Freq: Four times a day (QID) | ORAL | Status: DC | PRN
  Administered 2015-03-01 – 2015-03-10 (×11): 1 via ORAL
  Filled 2015-02-28: qty 1
  Filled 2015-02-28 (×2): qty 2
  Filled 2015-02-28 (×7): qty 1

## 2015-02-28 MED ORDER — DEXTROSE 50 % IV SOLN
1.0000 | Freq: Once | INTRAVENOUS | Status: AC
  Administered 2015-03-01: 50 mL via INTRAVENOUS

## 2015-02-28 MED ORDER — SODIUM POLYSTYRENE SULFONATE 15 GM/60ML PO SUSP
45.0000 g | Freq: Once | ORAL | Status: AC
  Administered 2015-02-28: 45 g via ORAL
  Filled 2015-02-28: qty 180

## 2015-02-28 MED ORDER — ACETAMINOPHEN 650 MG RE SUPP: 650.0000 mg | Freq: Four times a day (QID) | RECTAL | Status: DC | PRN

## 2015-02-28 MED ORDER — FENTANYL CITRATE 0.05 MG/ML IJ SOLN
25.0000 ug | INTRAMUSCULAR | Status: DC | PRN
Start: 2015-02-28 — End: 2015-03-10

## 2015-02-28 MED ORDER — ALBUTEROL SULFATE (2.5 MG/3ML) 0.083% IN NEBU
10.0000 mg | INHALATION_SOLUTION | Freq: Once | RESPIRATORY_TRACT | Status: AC
  Administered 2015-02-28: 10 mg via RESPIRATORY_TRACT
  Filled 2015-02-28: qty 12

## 2015-02-28 MED ORDER — SODIUM CHLORIDE 0.9 % IV SOLN
1.0000 g | Freq: Once | INTRAVENOUS | Status: AC
  Administered 2015-02-28: 1 g via INTRAVENOUS
  Filled 2015-02-28: qty 10

## 2015-02-28 MED ORDER — INSULIN ASPART 100 UNIT/ML ~~LOC~~ SOLN: 0.0000 [IU] | Freq: Three times a day (TID) | SUBCUTANEOUS | Status: DC

## 2015-02-28 MED ORDER — PANTOPRAZOLE SODIUM 40 MG PO TBEC
40.0000 mg | DELAYED_RELEASE_TABLET | Freq: Every day | ORAL | Status: DC
  Administered 2015-03-02 – 2015-03-10 (×9): 40 mg via ORAL
  Filled 2015-02-28 (×9): qty 1

## 2015-02-28 MED ORDER — SODIUM CHLORIDE 0.9 % IV SOLN
INTRAVENOUS | Status: DC
  Administered 2015-02-28: via INTRAVENOUS

## 2015-02-28 MED ORDER — SODIUM CHLORIDE 0.9 % IV SOLN
1000.0000 mL | INTRAVENOUS | Status: AC
  Administered 2015-02-28 – 2015-03-02 (×3): 1000 mL via INTRAVENOUS

## 2015-02-28 MED ORDER — LOTEPREDNOL ETABONATE 0.5 % OP SUSP
1.0000 [drp] | OPHTHALMIC | Status: DC
  Administered 2015-03-01 – 2015-03-09 (×5): 1 [drp] via OPHTHALMIC
  Filled 2015-02-28: qty 5

## 2015-02-28 MED ORDER — SODIUM CHLORIDE 0.9 % IV SOLN
1.0000 g | Freq: Once | INTRAVENOUS | Status: AC
  Administered 2015-03-01: 1 g via INTRAVENOUS
  Filled 2015-02-28: qty 10

## 2015-02-28 MED ORDER — SODIUM CHLORIDE 0.9 % IV SOLN
INTRAVENOUS | Status: DC
  Administered 2015-02-28: 13:00:00 via INTRAVENOUS

## 2015-02-28 MED ORDER — SODIUM BICARBONATE 8.4 % IV SOLN
50.0000 meq | Freq: Once | INTRAVENOUS | Status: AC
  Administered 2015-03-01: 50 meq via INTRAVENOUS
  Filled 2015-02-28: qty 50

## 2015-02-28 MED ORDER — PROMETHAZINE HCL 25 MG/ML IJ SOLN
12.5000 mg | Freq: Once | INTRAMUSCULAR | Status: AC
  Administered 2015-02-28: 12.5 mg via INTRAVENOUS
  Filled 2015-02-28 (×2): qty 1

## 2015-02-28 MED ORDER — DORZOLAMIDE HCL-TIMOLOL MAL 2-0.5 % OP SOLN
1.0000 [drp] | Freq: Two times a day (BID) | OPHTHALMIC | Status: DC
  Administered 2015-03-01 – 2015-03-10 (×20): 1 [drp] via OPHTHALMIC
  Filled 2015-02-28: qty 10

## 2015-02-28 MED ORDER — ONDANSETRON HCL 4 MG PO TABS
4.0000 mg | ORAL_TABLET | Freq: Four times a day (QID) | ORAL | Status: DC | PRN
  Administered 2015-03-01 – 2015-03-08 (×6): 4 mg via ORAL
  Filled 2015-02-28 (×6): qty 1

## 2015-02-28 MED ORDER — DOCUSATE SODIUM 100 MG PO CAPS
200.0000 mg | ORAL_CAPSULE | Freq: Every day | ORAL | Status: DC | PRN
  Administered 2015-03-07: 200 mg via ORAL
  Filled 2015-02-28: qty 2

## 2015-02-28 MED ORDER — IPRATROPIUM-ALBUTEROL 0.5-2.5 (3) MG/3ML IN SOLN: 3.0000 mL | Freq: Four times a day (QID) | RESPIRATORY_TRACT | Status: DC | PRN

## 2015-02-28 MED ORDER — ASPIRIN EC 81 MG PO TBEC
81.0000 mg | DELAYED_RELEASE_TABLET | Freq: Every day | ORAL | Status: DC
  Administered 2015-03-01: 81 mg via ORAL
  Filled 2015-02-28 (×3): qty 1

## 2015-02-28 MED ORDER — SODIUM POLYSTYRENE SULFONATE 15 GM/60ML PO SUSP
45.0000 g | Freq: Once | ORAL | Status: AC
  Administered 2015-03-01: 45 g via ORAL
  Filled 2015-02-28: qty 180

## 2015-02-28 MED ORDER — SODIUM CHLORIDE 0.9 % IV SOLN
1000.0000 mL | Freq: Once | INTRAVENOUS | Status: AC
  Administered 2015-02-28: 1000 mL via INTRAVENOUS

## 2015-02-28 MED ORDER — BISACODYL 5 MG PO TBEC
10.0000 mg | DELAYED_RELEASE_TABLET | Freq: Every day | ORAL | Status: DC | PRN
  Administered 2015-03-07 – 2015-03-09 (×2): 10 mg via ORAL
  Filled 2015-02-28 (×2): qty 2

## 2015-02-28 MED ORDER — ACETAMINOPHEN 325 MG PO TABS: 650.0000 mg | ORAL_TABLET | Freq: Four times a day (QID) | ORAL | Status: DC | PRN

## 2015-02-28 MED ORDER — VITAMIN B-12 1000 MCG PO TABS
1000.0000 ug | ORAL_TABLET | Freq: Every day | ORAL | Status: DC
  Administered 2015-03-01 – 2015-03-10 (×10): 1000 ug via ORAL
  Filled 2015-02-28 (×12): qty 1

## 2015-02-28 MED ORDER — POLYETHYLENE GLYCOL 3350 17 G PO PACK
17.0000 g | PACK | Freq: Every day | ORAL | Status: DC
  Administered 2015-03-01 – 2015-03-09 (×8): 17 g via ORAL
  Filled 2015-02-28 (×10): qty 1

## 2015-02-28 MED ORDER — SODIUM CHLORIDE 0.9 % IJ SOLN
3.0000 mL | Freq: Two times a day (BID) | INTRAMUSCULAR | Status: DC
  Administered 2015-02-28 – 2015-03-09 (×10): 3 mL via INTRAVENOUS

## 2015-02-28 MED ORDER — LORAZEPAM 0.5 MG PO TABS: 0.5000 mg | ORAL_TABLET | Freq: Four times a day (QID) | ORAL | Status: DC | PRN

## 2015-02-28 MED ORDER — ALBUTEROL SULFATE (2.5 MG/3ML) 0.083% IN NEBU
INHALATION_SOLUTION | RESPIRATORY_TRACT | Status: AC
  Filled 2015-02-28: qty 12

## 2015-02-28 NOTE — Assessment & Plan Note (Signed)
Platelet count has decreased to 60.  Most likely, this is secondary to recent chemotherapy.  Patient denies any worsening issues with either easy bleeding or bruising.

## 2015-02-28 NOTE — Telephone Encounter (Signed)
Wife called to say that since Saturday evening [name] has had trouble walking.  He has a shuffling gait and is not steady on his feet.  She said he is coherent sometimes and not.  He is not eating and drinking well and she would like to see someone asap.  Will schedule with the [facility].

## 2015-02-28 NOTE — ED Notes (Signed)
Pt, being sent by [facility] NP, c/o increase weakness and nausea x several days.  Lab work resulted elevated Creatinine 9.2 and Potassium> 9.  Hx of colon CA.     [facility] NP: Retta Mac [phone].

## 2015-02-28 NOTE — ED Notes (Signed)
Bed: RESB Expected date:  Expected time:  Means of arrival:  Comments: Pt from [place]

## 2015-02-28 NOTE — H&P (Signed)
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 Benay Spice), COPD, hyponatremia, diabetes mellitus presented to the [facility] today for an appointment secondary to one-week history of nausea and anorexia. Routine blood work was performed and revealed that the patient had AKI with serum creatinine of 9.2 and potassium >7.5. As a result, the patient was sent to the emergency department for further evaluation. In the emergency department, repeat labs confirmed the patient's hyperkalemia and AKI. On 02/23/2015, the patient's serum creatinine was 0.9. The patient denies any NSAID use, over-the-counter supplements, or new medications except for cephalexin.  Pt denies any ACE; in fact, pt denies hx of HTN. The patient had been taking cephalexin for the past 10 days because of a dental infection. The patient has been having nausea with decreased urine output over the past week. He denies any emesis or diarrhea. In fact, the patient has been complaining of constipation secondary to his opioid medications. He denies any fevers, chills, chest pain, shortness breath, hemoptysis, hematochezia, melena. His last chemotherapy was on 02/23/2015. notably, the patient was recently discharged from the hospital on 01/07/2015 after gastric colonic obstruction from his colon cancer. The patient had a stent placed in his duodenum by gastroenterology during this admission. He denies any recent dysphagia.   In the emergency department, serum creatinine was 9.7, potassium was >7.5. The patient was given albuterol, insulin and D50, and calcium gluconate. I also ordered Kayexalate, 45 g to be given. EKG did show mildly peaked T waves. The patient was afebrile with blood pressure 160/63 with oxygen saturation 96% on  room air. Assessment/Plan: AKI -unclear etiology -?related to chemo/?tumor lysis -?dehydration -renal US -UA -I have consulted renal--spoke with Dr. Moshe Cipro -transfer patient to [facility] -fluid challenge as pt appears dry clinically Hyperkalemia -kayexalate 45 grams x 1 -repeat BMP in 2 hours -D50/insulin/Ca gluconate/albuterol given in ED COPD -stable on RA -albuterol prn sob/wheeze Colon adenocarcinoma  -sees Dr. Benay Spice -completed cycle 4 of FOLFOX on 02/23/15 Thrombocytopenia -secondary to chemo -no active bleeding -monitor -SCDs for DVT prophylaxis Code Status -FULL CODE diabetes mellitus type 2  -NovoLog sliding scale  -01/06/2015 hemoglobin A1c 6.4        Past Medical History  Diagnosis Date  . COPD (chronic obstructive pulmonary disease)   . Nephrolithiasis   . Diverticulosis   . Hx of adenomatous colonic polyps 04/30/07  . Glaucoma   . Hyperlipidemia   . ED (erectile dysfunction)   . Lung nodule   . Hepatic steatosis   . Thoracic spondylosis   . SOB (shortness of breath)   . Bruises easily   . Hearing loss     slight  . Contact lens/glasses fitting   . Rectal bleeding   . Hemorrhoids   . Glaucoma   . 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 Anemia   . History of radiation therapy 05/25/14-06/14/14    gastric mets 37.5Gy/  . Non-small cell lung cancer   . Colon cancer   . 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 Diabetes mellitus     type II   Past Surgical History  Procedure Laterality Date  . 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 Lung removal, partial  05/2006    Lt upper lobe wedge resection  . Tonsillectomy    . Kidney stone removal  07/2006  . Colonoscopy    . Polypectomy    . Colon surgery  06/2011  . 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 Trigger finger release  08/29/2012    Procedure: RELEASE TRIGGER FINGER/A-1 PULLEY;  Surgeon: Cammie Sickle., MD;  Location: [place];  Service: Orthopedics;  Laterality: Left;  release a1 pulley left long  . Radioactive seed implantation Bilateral 05/28/2006    40seeds, lung left upper   . Superficial peroneal nerve release    . Mouth surgery    . Esophagogastroduodenoscopy (egd) with propofol N/A 01/04/2015    Procedure: ESOPHAGOGASTRODUODENOSCOPY (EGD) WITH PROPOFOL with gastric stent;  Surgeon: Inda Castle, MD;  Location: [facility];  Service: Endoscopy;  Laterality: N/A;  . Duodenal stent placement N/A 01/04/2015    Procedure: DUODENAL STENT PLACEMENT;  Surgeon: Inda Castle, MD;  Location: [facility];  Service: Endoscopy;  Laterality: N/A;  . Esophagogastroduodenoscopy N/A 01/06/2015    Procedure: ESOPHAGOGASTRODUODENOSCOPY (EGD)  with gastric/duodenal stent;  Surgeon: Inda Castle, MD;  Location: [facility];  Service: Endoscopy;  Laterality: N/A;  . Duodenal stent placement N/A 01/06/2015    Procedure: DUODENAL STENT PLACEMENT;  Surgeon: Inda Castle, MD;  Location: [facility];  Service: Endoscopy;  Laterality: N/A;   Social History:  reports that he quit smoking about 33 years ago. His smoking use included Cigarettes. He has a 50 pack-year smoking history. He has never used smokeless tobacco. He reports that he drinks about 0.6 oz of alcohol per week. He reports that he does not use illicit drugs.   Family History  Problem Relation Age of Onset  . Asthma      aunt  . Asthma Cousin   . Emphysema Father   . Leukemia Father   . Lung cancer Father   . Ulcers Brother     of the stomach  . Esophageal cancer Mother   . Cancer Paternal Aunt     breast  . Cancer Daughter      No Known Allergies    Prior to Admission medications   Medication Sig Start Date End Date Taking? Authorizing Provider  aspirin EC 81 MG tablet Take 81 mg by mouth at bedtime.   Yes Historical Provider, MD  bisacodyl (DULCOLAX) 5 MG EC tablet Take 10 mg by mouth daily as needed for moderate constipation.     Yes Historical Provider, MD  Cholecalciferol (VITAMIN D3) 2000 UNITS TABS Take 1 tablet by mouth daily with breakfast.    Yes Historical Provider, MD  diphenoxylate-atropine (LOMOTIL) 2.5-0.025 MG per tablet Take 1 tablet by mouth 4 (four) times daily as needed for diarrhea or loose stools. Up to 8 per day 10/27/14  Yes Owens Shark, NP  docusate sodium (COLACE) 100 MG capsule Take 200 mg by mouth daily as needed for moderate constipation.    Yes Historical Provider, MD  dorzolamide-timolol (COSOPT) 22.3-6.8 MG/ML ophthalmic solution Place 1 drop into the left eye 2 (two) times daily.  07/15/13  Yes Historical Provider, MD  HYDROcodone-acetaminophen (NORCO/VICODIN) 5-325 MG per tablet Take 1-2 tablets by mouth every 4 (four) hours as needed for moderate pain. Patient taking differently: Take 1 tablet by mouth every 4 (four) hours as needed for moderate pain.  12/01/14  Yes Owens Shark, NP  ibuprofen (ADVIL,MOTRIN) 200  MG tablet Take 200 mg by mouth every 6 (six) hours as needed.    Yes Historical Provider, MD  loratadine (CLARITIN) 10 MG tablet Take 10 mg by mouth 2 (two) times daily.     Yes Historical Provider, MD  LORazepam (ATIVAN) 0.5 MG tablet Take one to two at bedtime for sleep as needed Patient taking differently: Take 0.5-1 mg by mouth at bedtime as needed.  05/07/14  Yes Owens Shark, NP  loteprednol (LOTEMAX) 0.5 % ophthalmic suspension Place 1 drop into the left eye every other day.    Yes Historical Provider, MD  ondansetron (ZOFRAN) 8 MG tablet Take 1 tablet (8 mg total) by mouth every 12 (twelve) hours as needed for nausea. 02/09/15  Yes Ladell Pier, MD  oxyCODONE-acetaminophen (PERCOCET/ROXICET) 5-325 MG per tablet Take 1-2 tablets by mouth every 6 (six) hours as needed for severe pain. 02/23/15  Yes Owens Shark, NP  pantoprazole (PROTONIX) 40 MG tablet TAKE 1 TABLET ONCE DAILY. 02/07/15  Yes Jerene Bears, MD  polyethylene glycol (MIRALAX / GLYCOLAX) packet Take 17 g by mouth daily.    Yes Historical Provider, MD  PRESCRIPTION MEDICATION Chemo at [facility]   Yes Historical Provider, MD  prochlorperazine (COMPAZINE) 10 MG tablet Take 1 tablet (10 mg total) by mouth every 6 (six) hours as needed for nausea or vomiting. 08/18/14  Yes Ladell Pier, MD  vitamin B-12 (CYANOCOBALAMIN) 1000 MCG tablet Take 1,000 mcg by mouth daily with breakfast.   Yes Historical Provider, MD  cephALEXin (KEFLEX) 500 MG capsule Take 1 capsule by mouth 4 (four) times daily. 10 day course for wisdom tooth treatment 01/19/15   Historical Provider, MD  testosterone enanthate (DELATESTRYL) 200 MG/ML injection Inject 300 mg into the muscle every 21 ( twenty-one) days. Every 3 weeks currently    Historical Provider, MD    Review of Systems:  Constitutional:  No weight loss, night sweats, Fevers, chills Head&Eyes: No headache.  No vision loss.  No eye pain or scotoma ENT:  No Difficulty swallowing,Tooth/dental problems,Sore throat,    Cardio-vascular:  No chest pain, Orthopnea, PND, swelling in lower extremities,  dizziness, palpitations  GI:  No  vomiting, diarrhea, loss of appetite, hematochezia, melena, heartburn, indigestion, Resp:  No shortness of breath with exertion or at rest. No cough. No coughing up of blood .No wheezing.No chest wall deformity  Skin:  no rash or lesions.  GU:  no no urgency or frequency. No flank pain.  Musculoskeletal:  No joint pain or swelling. No decreased range of motion. No back pain.  Psych:  No change in mood or affect.  Neurologic: No headache, no dysesthesia, no focal weakness, no vision loss. No syncope  Physical Exam: Filed Vitals:   02/28/15 1545 02/28/15 1558 02/28/15 1615 02/28/15 1746  BP: 162/66 162/66 160/63 160/70  Pulse: 72 90 84 88  Temp:      TempSrc:      Resp:  16  18  SpO2: 92% 92% 92% 94%   General:  A&O x 3, NAD, emaciated, pleasant/cooperative Head/Eye: No conjunctival hemorrhage, no icterus, [place]/AT, No nystagmus ENT:  No icterus,   No thrush, good dentition, no pharyngeal exudate Neck:  No masses, no lymphadenpathy, no bruits CV:  RRR, no rub, no gallop, no S3 Lung:  diminished breath sounds but clear to auscultation.  Abdomen: soft/epigastric tenderness without any rebound, +BS, nondistended, no peritoneal signs Ext: No cyanosis, No rashes, No petechiae, No lymphangitis, No edema   Labs  on Admission:  Basic Metabolic Panel:  Recent Labs Lab 02/23/15 1057 02/28/15 1211 02/28/15 1639  NA 140 137 136  K 4.0 >9.0* >7.5*  CL  --   --  104  CO2 25 19* 21  GLUCOSE 126 111 122*  BUN 11.9 101.9* 111*  CREATININE 0.9 9.2* 9.78*  CALCIUM 9.5 9.7 9.1   Liver Function Tests:  Recent Labs Lab 02/23/15 1057 02/28/15 1211 02/28/15 1639  AST 12 14 17   ALT 6 10 13   ALKPHOS 102 96 86  BILITOT 0.44 0.57 0.7  PROT 5.9* 5.9* 5.9*  ALBUMIN 3.0* 2.9* 3.2*   No results for input(s): LIPASE, AMYLASE in the last 168 hours. No results for input(s): AMMONIA in the last 168 hours. CBC:  Recent Labs Lab 02/23/15 1056 02/28/15 1210 02/28/15 1454  WBC 6.6 3.3* 3.3*  NEUTROABS 5.0 3.0 2.9  HGB 11.6* 11.9* 11.6*  HCT 37.9* 38.2* 36.5*  MCV 79.9 77.3* 77.5*  PLT 190 60* 47*   Cardiac Enzymes: No results for input(s): CKTOTAL, CKMB, CKMBINDEX, TROPONINI in the last 168 hours. BNP: Invalid input(s): POCBNP CBG: No results for input(s): GLUCAP in the last 168 hours.  Radiological Exams on Admission: No results found.  EKG: Independently reviewed. Sinus with mildly peaked T waves    Time spent:70 minutes Code Status:   FULL Family Communication:   Wife updated at bedside   Chancy Claros, DO  [facility] Pager [phone]  If 7PM-7AM, please contact night-coverage [email or web address] Password [facility] 02/28/2015, 6:27 PM

## 2015-02-28 NOTE — Telephone Encounter (Signed)
RECEIVED VOICE MAIL FROM PT.'S WIFE. PT. IS IN THE CAR AND THEY ARE ON THEIR WAY TO THE CANCER CENTER. NOTIFIED NATALIE STROUD,RN- SYMPTOM MANAGEMENT NURSE VIA VOICE MAIL.

## 2015-02-28 NOTE — ED Notes (Signed)
Respiratory called for hour long neb.

## 2015-02-28 NOTE — Assessment & Plan Note (Signed)
Patient completed cycle 4 of his FOLFOX chemotherapy on 02/23/2015.  Patient is scheduled to receive cycle 5 of the same chemotherapy regimen on 03/09/2015.  The plan is to continue holding the Avastin for the time being.

## 2015-02-28 NOTE — Assessment & Plan Note (Signed)
Patient noted to have increased creatinine from 0.9 with last lab draw up to critical value of 9.2 today.  Patient is obviously dehydrated-but difficult to imagine marked elevated creatinine caused by dehydration alone.  Patient will be transported to the emergency department for further evaluation and management.

## 2015-02-28 NOTE — ED Notes (Signed)
Patient had chemotherapy on Wednesday and began to feel bad on Saturday. Today he was having nausea and was unable to take his zofran. He called the triage nurse at the cancer center whom advised them to come to the ER or the cancer center. Once at the cancer center, labs were drawn and the results were abnormal. He was transferred to the ER at that time. Patient denies fever and/or pain.

## 2015-02-28 NOTE — Consult Note (Signed)
[facility] Renal Consultation Note  Requesting MD: Tat Indication for Consultation: AKI and hyperkalemia  HPI:  [name] is a 74 y.o. male with past medical history significant for diabetes mellitus, COPD, as well as metastatic adenocarcinoma of the colon- currently on FOLFOX therapy including oxaliplatin which he had received in the past. Patient's baseline renal function is  normal with creatinine of 0.9 on 02/23/2015. He received chemotherapy on that day and reports that he had chills toward the end which is different for him. He then reports afterwards he felt poorly and just continued to feel poorly and it got worse over the weekend. He reports nausea, fatigue, inability to eat or drink, severe constipation secondary to narcotics and also recently requiring stenting to his duodenum due to colon obstruction from his cancer.  Patient denies using any NSAIDs. He is not he had  any ACE inhibitor or ARB. He does note decreased urine output.  He has been given medical treatment or his hyperkalemia to include albuterol,  Insulin,  calcium and Kayexalate. His initial EKG did show mildly peaked T waves. He is not having any neurologic compromise to his lower extremities. It's possible that he could be dry although his blood pressure is not low. He is currently receiving IV fluids and is able to give his own history.   CREATININE  Date/Time Value Ref Range Status  02/28/2015 12:11 PM 9.2* 0.7 - 1.3 mg/dL Final  02/23/2015 10:57 AM 0.9 0.7 - 1.3 mg/dL Final  02/09/2015 10:41 AM 0.9 0.7 - 1.3 mg/dL Final  01/26/2015 11:17 AM 0.9 0.7 - 1.3 mg/dL Final  01/12/2015 08:31 AM 1.0 0.7 - 1.3 mg/dL Final  12/29/2014 09:06 AM 0.9 0.7 - 1.3 mg/dL Final  11/09/2014 03:06 PM 1.1 0.7 - 1.3 mg/dL Final  10/27/2014 10:33 AM 1.0 0.7 - 1.3 mg/dL Final  10/06/2014 08:38 AM 1.1 0.7 - 1.3 mg/dL Final  09/15/2014 09:19 AM 1.1 0.7 - 1.3 mg/dL Final  09/01/2014 10:25 AM 1.0 0.7 - 1.3 mg/dL Final   08/18/2014 08:04 AM 1.1 0.7 - 1.3 mg/dL Final  07/27/2014 01:38 PM 1.1 0.7 - 1.3 mg/dL Final  06/14/2014 02:12 PM 1.2 0.7 - 1.3 mg/dL Final  05/12/2014 10:13 AM 1.3 0.7 - 1.3 mg/dL Final  03/31/2014 11:12 AM 1.2 0.7 - 1.3 mg/dL Final  03/03/2014 10:59 AM 1.1 0.7 - 1.3 mg/dL Final  02/03/2014 11:52 AM 1.1 0.7 - 1.3 mg/dL Final  01/18/2014 11:55 AM 1.2 0.7 - 1.3 mg/dL Final  12/23/2013 11:56 AM 1.2 0.7 - 1.3 mg/dL Final  12/07/2013 09:52 AM 1.2 0.7 - 1.3 mg/dL Final  11/03/2013 12:18 PM 1.1 0.7 - 1.3 mg/dL Final  10/08/2013 11:49 AM 1.1 0.7 - 1.3 mg/dL Final  09/23/2013 11:51 AM 1.1 0.7 - 1.3 mg/dL Final  09/09/2013 11:56 AM 1.0 0.7 - 1.3 mg/dL Final  08/26/2013 12:36 PM 1.1 0.7 - 1.3 mg/dL Final  07/27/2013 03:56 PM 1.3 0.7 - 1.3 mg/dL Final  07/01/2013 12:36 PM 1.2 0.7 - 1.3 mg/dL Final  06/22/2013 03:19 PM 1.3 0.7 - 1.3 mg/dL Final  06/17/2013 12:00 PM 1.2 0.7 - 1.3 mg/dL Final  04/29/2013 08:49 AM 1.2 0.7 - 1.3 mg/dL Final  04/07/2013 03:28 PM 1.4* 0.7 - 1.3 mg/dL Final  02/25/2013 03:32 PM 1.3 0.7 - 1.3 mg/dL Final  02/20/2013 02:19 PM 1.2 0.7 - 1.3 mg/dL Final  12/03/2012 02:19 PM 1.3 0.7 - 1.3 mg/dL Final  10/17/2012 03:09 PM 1.2 0.7 - 1.3 mg/dL Final  09/10/2012 08:57 AM 1.0 0.7 - 1.3 mg/dL Final   CREAT  Date/Time Value Ref Range Status  10/15/2011 04:01 PM 1.1 0.6 - 1.2 mg/dl Final  06/06/2011 04:01 PM 1.0 0.6 - 1.2 mg/dl Final   CREATININE, SER  Date/Time Value Ref Range Status  02/28/2015 04:39 PM 9.78* 0.50 - 1.35 mg/dL Final  01/07/2015 04:58 AM 0.76 0.50 - 1.35 mg/dL Final  01/06/2015 05:30 AM 0.79 0.50 - 1.35 mg/dL Final  01/05/2015 09:00 AM 0.77 0.50 - 1.35 mg/dL Final  01/01/2015 05:35 AM 0.90 0.50 - 1.35 mg/dL Final  04/27/2013 01:00 PM 1.15 0.50 - 1.35 mg/dL Final  12/31/2012 04:32 PM 1.08 0.50 - 1.35 mg/dL Final  08/28/2012 11:30 AM 1.01 0.50 - 1.35 mg/dL Final  08/13/2012 02:55 PM 0.98 0.50 - 1.35 mg/dL Final  07/16/2012 01:54 PM 0.92 0.50 - 1.35 mg/dL  Final  07/02/2012 03:38 PM 1.04 0.50 - 1.35 mg/dL Final  06/18/2012 03:26 PM 0.88 0.50 - 1.35 mg/dL Final  06/04/2012 03:35 PM 0.86 0.50 - 1.35 mg/dL Final  05/21/2012 01:13 PM 0.91 0.50 - 1.35 mg/dL Final  04/23/2012 01:08 PM 1.09 0.50 - 1.35 mg/dL Final  04/02/2012 12:42 PM 0.85 0.50 - 1.35 mg/dL Final  03/19/2012 01:03 PM 1.02 0.50 - 1.35 mg/dL Final  03/14/2012 03:33 PM 1.06 0.50 - 1.35 mg/dL Final  03/05/2012 01:01 PM 1.13 0.50 - 1.35 mg/dL Final  02/20/2012 12:59 PM 1.15 0.50 - 1.35 mg/dL Final  02/06/2012 11:26 AM 0.85 0.50 - 1.35 mg/dL Final  01/23/2012 01:10 PM 1.05 0.50 - 1.35 mg/dL Final  01/09/2012 01:12 PM 1.10 0.50 - 1.35 mg/dL Final  01/04/2012 10:09 AM 1.01 0.50 - 1.35 mg/dL Final  12/26/2011 01:15 PM 1.09 0.50 - 1.35 mg/dL Final  12/12/2011 01:13 PM 0.96 0.50 - 1.35 mg/dL Final  12/05/2011 11:48 AM 1.01 0.50 - 1.35 mg/dL Final  09/12/2011 10:34 AM 1.11 0.50 - 1.35 mg/dL Final  08/13/2011 10:58 AM 1.16 0.50 - 1.35 mg/dL Final  07/24/2011 03:35 PM 0.98 0.50 - 1.35 mg/dL Final  07/14/2011 04:06 AM 0.99 0.50 - 1.35 mg/dL Final  07/10/2011 04:52 AM 0.98 0.50 - 1.35 mg/dL Final  07/02/2011 11:00 AM 1.02 0.50 - 1.35 mg/dL Final    Comment:    **Please note change in reference range.**  11/22/2010 04:07 PM 1.14 0.40 - 1.50 mg/dL Final  08/02/2010 04:45 PM 1.02 0.4 - 1.5 mg/dL Final  11/09/2009 04:14 PM 1.07 0.40 - 1.50 mg/dL Final  04/27/2009 03:05 PM 0.95 0.40 - 1.50 mg/dL Final  12/08/2008 09:12 AM 0.97 0.40 - 1.50 mg/dL Final  09/08/2008 12:49 PM 0.90 0.40 - 1.50 mg/dL Final  06/09/2008 05:52 PM 0.90  Final  06/09/2008 04:13 PM 0.89 0.40 - 1.50 mg/dL Final  12/22/2007 01:56 PM 0.97 0.40 - 1.50 mg/dL Final  08/06/2007 04:10 PM 0.86 0.40 - 1.50 mg/dL Final  02/07/2007 09:39 AM 1.01 0.40 - 1.50 mg/dL Final  11/05/2006 09:15 AM 0.97 0.40 - 1.50 mg/dL Final  07/24/2006 09:40 AM 0.95 0.40 - 1.50 mg/dL Final  04/29/2006 12:31 PM 1.0 0.4 - 1.5 mg/dL Final     PMHx:    Past Medical History  Diagnosis Date  . COPD (chronic obstructive pulmonary disease)   . Nephrolithiasis   . Diverticulosis   . Hx of adenomatous colonic polyps 04/30/07  . Glaucoma   . Hyperlipidemia   . ED (erectile dysfunction)   . Lung nodule   . Hepatic steatosis   . Thoracic spondylosis   . SOB (shortness  of breath)   . Bruises easily   . Hearing loss     slight  . Contact lens/glasses fitting   . Rectal bleeding   . Hemorrhoids   . Glaucoma   . 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 Anemia   . History of radiation therapy 05/25/14-06/14/14    gastric mets 37.5Gy/  . Non-small cell lung cancer   . Colon cancer   . 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 Diabetes mellitus     type II    Past Surgical History  Procedure Laterality Date  . 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 Lung removal, partial  05/2006    Lt upper lobe wedge resection  . Tonsillectomy    . Kidney stone removal  07/2006  . Colonoscopy    . Polypectomy    . Colon surgery  06/2011  . 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 Trigger finger release  08/29/2012    Procedure: RELEASE TRIGGER FINGER/A-1 PULLEY;  Surgeon: Cammie Sickle., MD;  Location: [place];  Service: Orthopedics;  Laterality: Left;  release a1 pulley left long  . Radioactive seed implantation Bilateral 05/28/2006    40seeds, lung left upper   . Superficial peroneal nerve release    . Mouth surgery    . Esophagogastroduodenoscopy (egd) with propofol N/A 01/04/2015    Procedure: ESOPHAGOGASTRODUODENOSCOPY (EGD) WITH PROPOFOL with gastric stent;  Surgeon: Inda Castle, MD;  Location: [facility];  Service: Endoscopy;  Laterality: N/A;  . Duodenal stent placement N/A 01/04/2015    Procedure: DUODENAL STENT PLACEMENT;  Surgeon: Inda Castle, MD;  Location: [facility];  Service: Endoscopy;  Laterality: N/A;  . Esophagogastroduodenoscopy N/A 01/06/2015    Procedure:  ESOPHAGOGASTRODUODENOSCOPY (EGD)  with gastric/duodenal stent;  Surgeon: Inda Castle, MD;  Location: [facility];  Service: Endoscopy;  Laterality: N/A;  . Duodenal stent placement N/A 01/06/2015    Procedure: DUODENAL STENT PLACEMENT;  Surgeon: Inda Castle, MD;  Location: [facility];  Service: Endoscopy;  Laterality: N/A;    Family Hx:  Family History  Problem Relation Age of Onset  . Asthma      aunt  . Asthma Cousin   . Emphysema Father   . Leukemia Father   . Lung cancer Father   . Ulcers Brother     of the stomach  . Esophageal cancer Mother   . Cancer Paternal Aunt     breast  . Cancer Daughter     Social History:  reports that he quit smoking about 33 years ago. His smoking use included Cigarettes. He has a 50 pack-year smoking history. He has never used smokeless tobacco. He reports that he drinks about 0.6 oz of alcohol per week. He reports that he does not use illicit drugs.  Allergies: No Known Allergies  Medications: Prior to Admission medications   Medication Sig Start Date End Date Taking? Authorizing Provider  aspirin EC 81 MG tablet Take 81 mg by mouth at bedtime.   Yes Historical Provider, MD  bisacodyl (DULCOLAX) 5 MG EC tablet Take 10 mg by mouth daily as needed for moderate constipation.    Yes Historical Provider, MD  Cholecalciferol (VITAMIN D3) 2000 UNITS TABS Take 1 tablet by mouth daily with breakfast.    Yes Historical Provider, MD  diphenoxylate-atropine (LOMOTIL) 2.5-0.025 MG per tablet Take 1 tablet by mouth 4 (four)  times daily as needed for diarrhea or loose stools. Up to 8 per day 10/27/14  Yes Owens Shark, NP  docusate sodium (COLACE) 100 MG capsule Take 200 mg by mouth daily as needed for moderate constipation.    Yes Historical Provider, MD  dorzolamide-timolol (COSOPT) 22.3-6.8 MG/ML ophthalmic solution Place 1 drop into the left eye 2 (two) times daily.  07/15/13  Yes Historical Provider, MD  HYDROcodone-acetaminophen (NORCO/VICODIN)  5-325 MG per tablet Take 1-2 tablets by mouth every 4 (four) hours as needed for moderate pain. Patient taking differently: Take 1 tablet by mouth every 4 (four) hours as needed for moderate pain.  12/01/14  Yes Owens Shark, NP  ibuprofen (ADVIL,MOTRIN) 200 MG tablet Take 200 mg by mouth every 6 (six) hours as needed.    Yes Historical Provider, MD  loratadine (CLARITIN) 10 MG tablet Take 10 mg by mouth 2 (two) times daily.     Yes Historical Provider, MD  LORazepam (ATIVAN) 0.5 MG tablet Take one to two at bedtime for sleep as needed Patient taking differently: Take 0.5-1 mg by mouth at bedtime as needed.  05/07/14  Yes Owens Shark, NP  loteprednol (LOTEMAX) 0.5 % ophthalmic suspension Place 1 drop into the left eye every other day.    Yes Historical Provider, MD  ondansetron (ZOFRAN) 8 MG tablet Take 1 tablet (8 mg total) by mouth every 12 (twelve) hours as needed for nausea. 02/09/15  Yes Ladell Pier, MD  oxyCODONE-acetaminophen (PERCOCET/ROXICET) 5-325 MG per tablet Take 1-2 tablets by mouth every 6 (six) hours as needed for severe pain. 02/23/15  Yes Owens Shark, NP  pantoprazole (PROTONIX) 40 MG tablet TAKE 1 TABLET ONCE DAILY. 02/07/15  Yes Jerene Bears, MD  polyethylene glycol (MIRALAX / GLYCOLAX) packet Take 17 g by mouth daily.   Yes Historical Provider, MD  PRESCRIPTION MEDICATION Chemo at [facility]   Yes Historical Provider, MD  prochlorperazine (COMPAZINE) 10 MG tablet Take 1 tablet (10 mg total) by mouth every 6 (six) hours as needed for nausea or vomiting. 08/18/14  Yes Ladell Pier, MD  vitamin B-12 (CYANOCOBALAMIN) 1000 MCG tablet Take 1,000 mcg by mouth daily with breakfast.   Yes Historical Provider, MD  cephALEXin (KEFLEX) 500 MG capsule Take 1 capsule by mouth 4 (four) times daily. 10 day course for wisdom tooth treatment 01/19/15   Historical Provider, MD  testosterone enanthate (DELATESTRYL) 200 MG/ML injection Inject 300 mg into the muscle every 21 ( twenty-one) days. Every 3  weeks currently    Historical Provider, MD    I have reviewed the patient's current medications.  Labs:  Results for orders placed or performed during the hospital encounter of 02/28/15 (from the past 48 hour(s))  CBC with Differential/Platelet     Status: Abnormal   Collection Time: 02/28/15  2:54 PM  Result Value Ref Range   WBC 3.3 (L) 4.0 - 10.5 K/uL   RBC 4.71 4.22 - 5.81 MIL/uL   Hemoglobin 11.6 (L) 13.0 - 17.0 g/dL   HCT 36.5 (L) 39.0 - 52.0 %   MCV 77.5 (L) 78.0 - 100.0 fL   MCH 24.6 (L) 26.0 - 34.0 pg   MCHC 31.8 30.0 - 36.0 g/dL   RDW 18.5 (H) 11.5 - 15.5 %   Platelets 47 (L) 150 - 400 K/uL    Comment: SPECIMEN CHECKED FOR CLOTS REPEATED TO VERIFY PLATELET COUNT CONFIRMED BY SMEAR    Neutrophils Relative % 88 (H) 43 - 77 %  Neutro Abs 2.9 1.7 - 7.7 K/uL   Lymphocytes Relative 9 (L) 12 - 46 %   Lymphs Abs 0.3 (L) 0.7 - 4.0 K/uL   Monocytes Relative 3 3 - 12 %   Monocytes Absolute 0.1 0.1 - 1.0 K/uL   Eosinophils Relative 1 0 - 5 %   Eosinophils Absolute 0.0 0.0 - 0.7 K/uL   Basophils Relative 0 0 - 1 %   Basophils Absolute 0.0 0.0 - 0.1 K/uL  Comprehensive metabolic panel     Status: Abnormal   Collection Time: 02/28/15  4:39 PM  Result Value Ref Range   Sodium 136 135 - 145 mmol/L   Potassium >7.5 (HH) 3.5 - 5.1 mmol/L    Comment: RESULTS VERIFIED VIA RECOLLECT CRITICAL RESULT CALLED TO, READ BACK BY AND VERIFIED WITH: SPOKE WITH MCCLURE,L RN [id number] [place],N    Chloride 104 96 - 112 mmol/L   CO2 21 19 - 32 mmol/L   Glucose, Bld 122 (H) 70 - 99 mg/dL   BUN 111 (H) 6 - 23 mg/dL    Comment: RESULTS CONFIRMED BY MANUAL DILUTION RESULTS VERIFIED VIA RECOLLECT    Creatinine, Ser 9.78 (H) 0.50 - 1.35 mg/dL   Calcium 9.1 8.4 - 10.5 mg/dL   Total Protein 5.9 (L) 6.0 - 8.3 g/dL   Albumin 3.2 (L) 3.5 - 5.2 g/dL   AST 17 0 - 37 U/L   ALT 13 0 - 53 U/L   Alkaline Phosphatase 86 39 - 117 U/L   Total Bilirubin 0.7 0.3 - 1.2 mg/dL   GFR calc non Af Amer 5  (L) >90 mL/min   GFR calc Af Amer 5 (L) >90 mL/min    Comment: (NOTE) The eGFR has been calculated using the CKD EPI equation. This calculation has not been validated in all clinical situations. eGFR's persistently <90 mL/min signify possible Chronic Kidney Disease.    Anion gap 11 5 - 15     ROS:  A comprehensive review of systems was negative except for: Constitutional: positive for anorexia Gastrointestinal: positive for change in bowel habits, constipation and nausea Also decreased UOP as noted in the HPI   Physical Exam: Filed Vitals:   02/28/15 1843  BP: 170/71  Pulse: 92  Temp:   Resp: 20     General: Thin appearing white male who is alert, oriented. He is nontoxic appearing HEENT: Pupils are equal round reactive to light, extra ocular motions are intact, mucous membranes are dry Neck: There is no jugular venous distention Heart: Tachycardic without murmur. On the monitor T waves are not peaked Lungs: Distant breath sounds bilaterally consistent with a diagnosis of COPD Abdomen: Diffusely tender. No rebound or guarding Extremities: No edema Skin: Dry  Neuro: Alert and nonfocal. He has no focal weakness to lower extremities  Assessment/Plan: 74 year old white male who presents with acute kidney injury after course of chemotherapy with oxaliplatin and also possibly volume depletion-induced ATN  1.Renal- acute kidney injury with baseline normal renal function. This is coincident in time with his administration of chemotherapy and then symptoms afterwards of nausea vomiting and constipation. I suspect possible direct nephrotoxicity of the oxaliplatin versus hypovolemic induced ATN-not sure if something like tumor lysis syndrome is seen with this type of malignancy. I've also ordered a renal ultrasound for tomorrow. Agree with Foley catheter placement. He tells me that he has voided since being in the emergency room so his urine output is not 0. I agree with the management to  date to  hydrate an attempt to medically treat his hyperkalemia. The patient and his wife understand that dialysis may be a possibility if we are unable to get his potassium down by medical means. 2. Hypertension/volume  - seems dry even though blood pressure is not exceedingly low. I'm okay with IV hydration at this time as he does not appear to be volume overloaded. 3. Hyperkalemia-was on no medication to cause this preadmission. Aggressive medical treatment is underway to get potassium down in order to avoid dialysis therapy. I will be watching his potassium level overnight to monitor. I have talked to CCM in order to put them on standby to place a line for dialysis if needed  Thank you for this consult, I will continue to follow with you    Melinda Gwinner A 02/28/2015, 6:56 PM

## 2015-02-28 NOTE — Assessment & Plan Note (Signed)
Patient has had little oral intake since this past weekend.  He does appear very dehydrated today.  IV fluid rehydration was initiated while the cancer Center.  Patient will be transported to the emergency department for further evaluation and management.

## 2015-02-28 NOTE — ED Provider Notes (Signed)
Pt seen by Dr Winfred Leeds.  Please see his notes.  I reviewed the labs from the cancer center today.  Cr is elevated and K is documented at 9.  Will start empiric treatment while waiting for repeat laboratory testing to confirm the abnormal result.  Medications  calcium chloride 1 g in sodium chloride 0.9 % 100 mL IVPB (not administered)  insulin aspart (novoLOG) injection 10 Units (not administered)  dextrose 50 % solution 50 mL (not administered)  albuterol (PROVENTIL) (2.5 MG/3ML) 0.083% nebulizer solution 10 mg (not administered)   Repeat CMET confirms acute renal failure and hyperkalemia.  ECG at 1pm did not show peaked t waves or widening QRS.  Acute renal failure may be mulifactorial.  He has been having nausea and vomiting.  Will give bolus of fluid.  Admit for further treatment.  Dorie Rank, MD 02/28/15 1739

## 2015-02-28 NOTE — Assessment & Plan Note (Signed)
Patient's wife reports that patient has had minimal oral intake since this past weekend.  He does appear dehydrated today.  Port-A-Cath access; and IV fluid hydration initiated while the cancer Center.  Patient will be transported to the emergency department for further evaluation and management.

## 2015-02-28 NOTE — ED Notes (Signed)
[facility] called. 

## 2015-02-28 NOTE — Assessment & Plan Note (Signed)
Potassium has increased critical value of greater than 9 on today's lab draw.  Patient does admittedly appear dehydrated; complaining of no appetite.  He denies any chest pain, chest pressure, shortness of breath, or pain with inspiration.  Patient will be transported to the emergency department for further evaluation and management of critical findings of hyperkalemia and acute renal insufficiency; as well as dehydration and weakness.  Brief history and report was given to the emergency department charge nurse Perry.  Patient was transported to the emergency department via wheelchair per the [place].

## 2015-02-28 NOTE — Telephone Encounter (Signed)
SPOKE TO CINDEE BACON,NP. IF PT. IS UNABLE TO COME TO [facility] HE NEEDS TO GO TO THE EMERGENCY DEPARTMENT FOR AN EVALUATION. PT.'S WIFE WILL TALK TO HER HUSBAND AND SEE IF SHE CAN GET SOME HELP SO HE CAN COMETO THE CANCER CENTER.

## 2015-02-28 NOTE — ED Provider Notes (Signed)
CSN: [id number]     Arrival date & time 02/28/15  1323 History   First MD Initiated Contact with Patient 02/28/15 1344     Chief Complaint  Patient presents with  . Nausea  . Abnormal Lab     (Consider location/radiation/quality/duration/timing/severity/associated sxs/prior Treatment) HPI Patient with nausea for the past 3-4 days. Sent here from the cancer center where he was noted to have creatinine 9.2 and potassium greater than 9 from blood draw earlier today as per conversation with Ms. Berniece Salines, nurse practitioner. Patient denies fever. Denies chest pain  he does admit to mild diffuse abdominal pain which is chronic, and unchanged since starting chemotherapy. Past Medical History  Diagnosis Date  . COPD (chronic obstructive pulmonary disease)   . Nephrolithiasis   . Diverticulosis   . Hx of adenomatous colonic polyps 04/30/07  . Glaucoma   . Hyperlipidemia   . ED (erectile dysfunction)   . Lung nodule   . Hepatic steatosis   . Thoracic spondylosis   . SOB (shortness of breath)   . Bruises easily   . Hearing loss     slight  . Contact lens/glasses fitting   . Rectal bleeding   . Hemorrhoids   . Glaucoma   . 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 Anemia   . History of radiation therapy 05/25/14-06/14/14    gastric mets 37.5Gy/  . Non-small cell lung cancer   . Colon cancer   . 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 Diabetes mellitus     type II   Past Surgical History  Procedure Laterality Date  . 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 Lung removal, partial  05/2006    Lt upper lobe wedge resection  . Tonsillectomy    . Kidney stone removal  07/2006  . Colonoscopy    . Polypectomy    . Colon surgery  06/2011  . 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 Trigger finger release  08/29/2012    Procedure: RELEASE TRIGGER FINGER/A-1 PULLEY;  Surgeon: Cammie Sickle., MD;  Location: [facility];  Service:  Orthopedics;  Laterality: Left;  release a1 pulley left long  . Radioactive seed implantation Bilateral 05/28/2006    40seeds, lung left upper   . Superficial peroneal nerve release    . Mouth surgery    . Esophagogastroduodenoscopy (egd) with propofol N/A 01/04/2015    Procedure: ESOPHAGOGASTRODUODENOSCOPY (EGD) WITH PROPOFOL with gastric stent;  Surgeon: Inda Castle, MD;  Location: [facility];  Service: Endoscopy;  Laterality: N/A;  . Duodenal stent placement N/A 01/04/2015    Procedure: DUODENAL STENT PLACEMENT;  Surgeon: Inda Castle, MD;  Location: [facility];  Service: Endoscopy;  Laterality: N/A;  . Esophagogastroduodenoscopy N/A 01/06/2015    Procedure: ESOPHAGOGASTRODUODENOSCOPY (EGD)  with gastric/duodenal stent;  Surgeon: Inda Castle, MD;  Location: [facility];  Service: Endoscopy;  Laterality: N/A;  . Duodenal stent placement N/A 01/06/2015    Procedure: DUODENAL STENT PLACEMENT;  Surgeon: Inda Castle, MD;  Location: [facility];  Service: Endoscopy;  Laterality: N/A;   Family History  Problem Relation Age of Onset  . Asthma      aunt  . Asthma Cousin   . Emphysema Father   . Leukemia Father   . Lung cancer Father   . Ulcers Brother     of the stomach  . Esophageal cancer Mother   .  Cancer Paternal Aunt     breast  . Cancer Daughter    History  Substance Use Topics  . Smoking status: Former Smoker -- 2.00 packs/day for 25 years    Types: Cigarettes    Quit date: 03/13/1981  . Smokeless tobacco: Never Used  . Alcohol Use: 0.6 oz/week    1 Cans of beer per week     Comment: occasional beer    Review of Systems  Gastrointestinal: Positive for nausea and abdominal pain.  All other systems reviewed and are negative.     Allergies  Review of patient's allergies indicates no known allergies.  Home Medications   Prior to Admission medications   Medication Sig Start Date End Date Taking? Authorizing Provider  aspirin EC 81 MG tablet Take 81 mg  by mouth at bedtime.    Historical Provider, MD  bisacodyl (DULCOLAX) 5 MG EC tablet Take 10 mg by mouth daily with breakfast.    Historical Provider, MD  Cholecalciferol (VITAMIN D3) 2000 UNITS TABS Take 1 tablet by mouth daily with breakfast.     Historical Provider, MD  diphenoxylate-atropine (LOMOTIL) 2.5-0.025 MG per tablet Take 1 tablet by mouth 4 (four) times daily as needed for diarrhea or loose stools. Up to 8 per day 10/27/14   Owens Shark, NP  docusate sodium (COLACE) 100 MG capsule Take 200 mg by mouth daily with breakfast.    Historical Provider, MD  dorzolamide-timolol (COSOPT) 22.3-6.8 MG/ML ophthalmic solution Place 1 drop into the left eye 2 (two) times daily.  07/15/13   Historical Provider, MD  HYDROcodone-acetaminophen (NORCO/VICODIN) 5-325 MG per tablet Take 1-2 tablets by mouth every 4 (four) hours as needed for moderate pain. Patient taking differently: Take 1 tablet by mouth every 4 (four) hours as needed for moderate pain.  12/01/14   Owens Shark, NP  ibuprofen (ADVIL,MOTRIN) 200 MG tablet Take 200 mg by mouth every 6 (six) hours as needed.     Historical Provider, MD  loperamide (IMODIUM A-D) 2 MG tablet Take by mouth as needed for diarrhea or loose stools. Take 2 tablets at the first sign of diarrhea then take one every 2 hours. Take 2 tablets at bedtime. Stop when you have not had a loose stool in twelve hours.    Historical Provider, MD  loratadine (CLARITIN) 10 MG tablet Take 10 mg by mouth 2 (two) times daily.      Historical Provider, MD  LORazepam (ATIVAN) 0.5 MG tablet Take one to two at bedtime for sleep as needed Patient taking differently: Take 0.5-1 mg by mouth at bedtime as needed.  05/07/14   Owens Shark, NP  loteprednol (LOTEMAX) 0.5 % ophthalmic suspension Place 1 drop into the left eye every other day.     Historical Provider, MD  ondansetron (ZOFRAN) 8 MG tablet Take 1 tablet (8 mg total) by mouth every 12 (twelve) hours as needed for nausea. 02/09/15   Ladell Pier, MD  oxyCODONE-acetaminophen (PERCOCET/ROXICET) 5-325 MG per tablet Take 1-2 tablets by mouth every 6 (six) hours as needed for severe pain. 02/23/15   Owens Shark, NP  pantoprazole (PROTONIX) 40 MG tablet TAKE 1 TABLET ONCE DAILY. 02/07/15   Jerene Bears, MD  polyethylene glycol (MIRALAX / GLYCOLAX) packet Take 17 g by mouth daily.    Historical Provider, MD  prochlorperazine (COMPAZINE) 10 MG tablet Take 1 tablet (10 mg total) by mouth every 6 (six) hours as needed for nausea or vomiting. 08/18/14  Ladell Pier, MD  testosterone enanthate (DELATESTRYL) 200 MG/ML injection Inject 300 mg into the muscle every 21 ( twenty-one) days. Every 3 weeks currently    Historical Provider, MD  vitamin B-12 (CYANOCOBALAMIN) 1000 MCG tablet Take 1,000 mcg by mouth daily with breakfast.    Historical Provider, MD   BP 165/75 mmHg  Pulse 95  Temp(Src) 97.8 F (36.6 C) (Oral)  Resp 16  SpO2 95% Physical Exam  Constitutional:  Chronically ill-appearing  HENT:  Head: Normocephalic and atraumatic.  Eyes: Conjunctivae are normal. Pupils are equal, round, and reactive to light.  Neck: Neck supple. No tracheal deviation present. No thyromegaly present.  Cardiovascular: Normal rate and regular rhythm.   No murmur heard. Pulmonary/Chest: Effort normal and breath sounds normal.  Abdominal: Soft. Bowel sounds are normal. He exhibits no distension. There is tenderness.  Mild epigastric tenderness  Musculoskeletal: Normal range of motion. He exhibits no edema or tenderness.  Neurological: He is alert. Coordination normal.  Skin: Skin is warm and dry. No rash noted.  Psychiatric: He has a normal mood and affect.  Nursing note and vitals reviewed.   ED Course  Procedures (including critical care time) Labs Review Labs Reviewed - No data to display  Imaging Review No results found.   EKG Interpretation None      Date: 02/28/2015  Rate: 90  Rhythm: normal sinus rhythm  QRS Axis: left   Intervals: normal  ST/T Wave abnormalities: normal  Conduction Disutrbances:none  Narrative Interpretation:   Old EKG Reviewed: unchanged No significant change from 08/28/2012 interpreted by me  Pt signed out to Dr . Hillard Danker at 405 pm Results for orders placed or performed during the hospital encounter of 02/28/15  CBC with Differential/Platelet  Result Value Ref Range   WBC 3.3 (L) 4.0 - 10.5 K/uL   RBC 4.71 4.22 - 5.81 MIL/uL   Hemoglobin 11.6 (L) 13.0 - 17.0 g/dL   HCT 36.5 (L) 39.0 - 52.0 %   MCV 77.5 (L) 78.0 - 100.0 fL   MCH 24.6 (L) 26.0 - 34.0 pg   MCHC 31.8 30.0 - 36.0 g/dL   RDW 18.5 (H) 11.5 - 15.5 %   Platelets 47 (L) 150 - 400 K/uL   Neutrophils Relative % 88 (H) 43 - 77 %   Neutro Abs 2.9 1.7 - 7.7 K/uL   Lymphocytes Relative 9 (L) 12 - 46 %   Lymphs Abs 0.3 (L) 0.7 - 4.0 K/uL   Monocytes Relative 3 3 - 12 %   Monocytes Absolute 0.1 0.1 - 1.0 K/uL   Eosinophils Relative 1 0 - 5 %   Eosinophils Absolute 0.0 0.0 - 0.7 K/uL   Basophils Relative 0 0 - 1 %   Basophils Absolute 0.0 0.0 - 0.1 K/uL   No results found.  MDM   Final diagnoses:  None    Lab work redrawn to verify. Pending at present. No acute E CG changes to suggest hyperkalemia   Orlie Dakin, MD 02/28/15 [phone]

## 2015-02-28 NOTE — Assessment & Plan Note (Signed)
Patient has been complaining of chronic nausea and intermittent vomiting since receiving his last cycle of chemotherapy on 02/23/2015.  He states the taken Zofran has helped little.  Patient does appear very dehydrated and weak today.

## 2015-02-28 NOTE — Assessment & Plan Note (Signed)
Patient is reporting chronic nausea and intermittent vomiting; with little oral intake and subsequent dehydration since receiving his last cycle of chemotherapy on 02/23/2015.  On exam-patient does appear extremely weak; but oriented 3.  Patient will be transported to the emergency department for further evaluation.

## 2015-02-28 NOTE — ED Notes (Signed)
Called to give report. Nurse will call back 

## 2015-02-28 NOTE — Progress Notes (Signed)
Patient transferred to ED @ 1320

## 2015-02-28 NOTE — Progress Notes (Signed)
SYMPTOM MANAGEMENT CLINIC   HPI: [name] 74 y.o. male diagnosed with colon cancer.  Currently undergoing FOLFOX chemotherapy regimen.  Holding Avastin therapy at this time.  Patient received cycle 4 of his FOLFOX chemotherapy on 02/23/2015.  Since that time-patient has been experiencing chronic nausea and intermittent vomiting.  He denies any diarrhea.  He has had minimal appetite; admits to very poor oral intake.  He feels dehydrated today.  He is also complaining of overall, generalized weakness.  He denies any recent fevers or chills.   HPI  ROS  Past Medical History  Diagnosis Date  . COPD (chronic obstructive pulmonary disease)   . Nephrolithiasis   . Diverticulosis   . Hx of adenomatous colonic polyps 04/30/07  . Glaucoma   . Hyperlipidemia   . ED (erectile dysfunction)   . Lung nodule   . Hepatic steatosis   . Thoracic spondylosis   . SOB (shortness of breath)   . Bruises easily   . Hearing loss     slight  . Contact lens/glasses fitting   . Rectal bleeding   . Hemorrhoids   . Glaucoma   . 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 Anemia   . History of radiation therapy 05/25/14-06/14/14    gastric mets 37.5Gy/  . Non-small cell lung cancer   . Colon cancer   . 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 Diabetes mellitus     type II    Past Surgical History  Procedure Laterality Date  . 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 Lung removal, partial  05/2006    Lt upper lobe wedge resection  . Tonsillectomy    . Kidney stone removal  07/2006  . Colonoscopy    . Polypectomy    . Colon surgery  06/2011  . 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 Trigger finger release  08/29/2012    Procedure: RELEASE TRIGGER FINGER/A-1 PULLEY;  Surgeon: Cammie Sickle., MD;  Location: [facility];  Service: Orthopedics;  Laterality: Left;  release a1 pulley left long  . Radioactive seed implantation Bilateral 05/28/2006      40seeds, lung left upper   . Superficial peroneal nerve release    . Mouth surgery    . Esophagogastroduodenoscopy (egd) with propofol N/A 01/04/2015    Procedure: ESOPHAGOGASTRODUODENOSCOPY (EGD) WITH PROPOFOL with gastric stent;  Surgeon: Inda Castle, MD;  [facility]: [facility];  Service: Endoscopy;  Laterality: N/A;  . Duodenal stent placement N/A 01/04/2015    Procedure: DUODENAL STENT PLACEMENT;  Surgeon: Inda Castle, MD;  Location: [facility];  Service: Endoscopy;  Laterality: N/A;  . Esophagogastroduodenoscopy N/A 01/06/2015    Procedure: ESOPHAGOGASTRODUODENOSCOPY (EGD)  with gastric/duodenal stent;  Surgeon: Inda Castle, MD;  Location: [facility];  Service: Endoscopy;  Laterality: N/A;  . Duodenal stent placement N/A 01/06/2015    Procedure: DUODENAL STENT PLACEMENT;  Surgeon: Inda Castle, MD;  Location: [facility];  Service: Endoscopy;  Laterality: N/A;    has NEOPLASM, MALIGNANT, LUNG, NON-SMALL CELL; ALLERGIC RHINITIS; COPD with emphysema; Hypoxemia; Iron deficiency anemia due to chronic blood loss; Personal history of colonic polyps; Colon adenocarcinoma; Diabetes mellitus; Contact lens/glasses fitting; Neuropathy due to chemotherapeutic drug; Dyspnea; Metastasis from malignant tumor of colon; Gastric outlet obstruction; Protein-calorie malnutrition, severe; Gastric outflow obstruction; Esophagitis; Hypersensitivity reaction; Weakness; Nausea with vomiting; Anorexia; Dehydration; Thrombocytopenia; Hyperkalemia; and Acute renal  insufficiency on his problem list.    has No Known Allergies.    Medication List       This list is accurate as of: 02/28/15  1:23 PM.  Always use your most recent med list.               aspirin EC 81 MG tablet  Take 81 mg by mouth at bedtime.     bisacodyl 5 MG EC tablet  Commonly known as:  DULCOLAX  Take 10 mg by mouth daily as needed for moderate constipation.     cephALEXin 500 MG capsule  Commonly known as:  KEFLEX   Take 1 capsule by mouth 4 (four) times daily. 10 day course for wisdom tooth treatment     diphenoxylate-atropine 2.5-0.025 MG per tablet  Commonly known as:  LOMOTIL  Take 1 tablet by mouth 4 (four) times daily as needed for diarrhea or loose stools. Up to 8 per day     docusate sodium 100 MG capsule  Commonly known as:  COLACE  Take 200 mg by mouth daily as needed for moderate constipation.     dorzolamide-timolol 22.3-6.8 MG/ML ophthalmic solution  Commonly known as:  COSOPT  Place 1 drop into the left eye 2 (two) times daily.     HYDROcodone-acetaminophen 5-325 MG per tablet  Commonly known as:  NORCO/VICODIN  Take 1-2 tablets by mouth every 4 (four) hours as needed for moderate pain.     ibuprofen 200 MG tablet  Commonly known as:  ADVIL,MOTRIN  Take 200 mg by mouth every 6 (six) hours as needed.     loperamide 2 MG tablet  Commonly known as:  IMODIUM A-D  Take by mouth as needed for diarrhea or loose stools. Take 2 tablets at the first sign of diarrhea then take one every 2 hours. Take 2 tablets at bedtime. Stop when you have not had a loose stool in twelve hours.     loratadine 10 MG tablet  Commonly known as:  CLARITIN  Take 10 mg by mouth 2 (two) times daily.     LORazepam 0.5 MG tablet  Commonly known as:  ATIVAN  Take one to two at bedtime for sleep as needed     loteprednol 0.5 % ophthalmic suspension  Commonly known as:  LOTEMAX  Place 1 drop into the left eye every other day.     ondansetron 8 MG tablet  Commonly known as:  ZOFRAN  Take 1 tablet (8 mg total) by mouth every 12 (twelve) hours as needed for nausea.     oxyCODONE-acetaminophen 5-325 MG per tablet  Commonly known as:  PERCOCET/ROXICET  Take 1-2 tablets by mouth every 6 (six) hours as needed for severe pain.     pantoprazole 40 MG tablet  Commonly known as:  PROTONIX  TAKE 1 TABLET ONCE DAILY.     polyethylene glycol packet  Commonly known as:  MIRALAX / GLYCOLAX  Take 17 g by mouth  daily.     prochlorperazine 10 MG tablet  Commonly known as:  COMPAZINE  Take 1 tablet (10 mg total) by mouth every 6 (six) hours as needed for nausea or vomiting.     testosterone enanthate 200 MG/ML injection  Commonly known as:  DELATESTRYL  Inject 300 mg into the muscle every 21 ( twenty-one) days. Every 3 weeks currently     vitamin B-12 1000 MCG tablet  Commonly known as:  CYANOCOBALAMIN  Take 1,000 mcg by mouth daily with breakfast.  Vitamin D3 2000 UNITS Tabs  Take 1 tablet by mouth daily with breakfast.         PHYSICAL EXAMINATION  Oncology Vitals 02/28/2015 02/28/2015 02/28/2015 02/28/2015 02/28/2015 02/28/2015 02/25/2015  Height - - - - - - -  Weight - - - - - - -  Weight (lbs) - - - - - - -  BMI (kg/m2) - - - - - - -  Temp - - - - 97.8 98.2 -  Pulse 84 90 72 80 95 109 93  Resp - 16 - 16 16 16  -  Resp (Historical as of 07/24/12) - - - - - - -  SpO2 92 92 92 100 95 100 96  BSA (m2) - - - - - - -   BP Readings from Last 3 Encounters:  02/28/15 160/63  02/28/15 155/75  02/25/15 110/56    Physical Exam  Constitutional: He is oriented to person, place, and time.  Patient appears very weak, pale, and chronically ill.  HENT:  Head: Normocephalic and atraumatic.  Mouth/Throat: Oropharynx is clear and moist.  Eyes: Conjunctivae and EOM are normal. Pupils are equal, round, and reactive to light. Right eye exhibits no discharge. Left eye exhibits no discharge. No scleral icterus.  Neck: Normal range of motion. Neck supple. No JVD present. No tracheal deviation present. No thyromegaly present.  Cardiovascular: Normal rate, regular rhythm, normal heart sounds and intact distal pulses.   Pulmonary/Chest: Effort normal and breath sounds normal. No respiratory distress. He has no wheezes. He has no rales. He exhibits no tenderness.  Abdominal: Soft. Bowel sounds are normal. He exhibits no distension and no mass. There is no tenderness. There is no rebound and no guarding.   Musculoskeletal: Normal range of motion. He exhibits no edema or tenderness.  Lymphadenopathy:    He has no cervical adenopathy.  Neurological: He is alert and oriented to person, place, and time.  Skin: Skin is warm and dry. No rash noted. No erythema. There is pallor.  Psychiatric: Affect normal.  Nursing note and vitals reviewed.   LABORATORY DATA:. Admission on 02/28/2015  Component Date Value Ref Range Status  . WBC 02/28/2015 3.3* 4.0 - 10.5 K/uL Final  . RBC 02/28/2015 4.71  4.22 - 5.81 MIL/uL Final  . Hemoglobin 02/28/2015 11.6* 13.0 - 17.0 g/dL Final  . HCT 02/28/2015 36.5* 39.0 - 52.0 % Final  . MCV 02/28/2015 77.5* 78.0 - 100.0 fL Final  . MCH 02/28/2015 24.6* 26.0 - 34.0 pg Final  . MCHC 02/28/2015 31.8  30.0 - 36.0 g/dL Final  . RDW 02/28/2015 18.5* 11.5 - 15.5 % Final  . Platelets 02/28/2015 47* 150 - 400 K/uL Final   Comment: SPECIMEN CHECKED FOR CLOTS REPEATED TO VERIFY PLATELET COUNT CONFIRMED BY SMEAR   . Neutrophils Relative % 02/28/2015 88* 43 - 77 % Final  . Neutro Abs 02/28/2015 2.9  1.7 - 7.7 K/uL Final  . Lymphocytes Relative 02/28/2015 9* 12 - 46 % Final  . Lymphs Abs 02/28/2015 0.3* 0.7 - 4.0 K/uL Final  . Monocytes Relative 02/28/2015 3  3 - 12 % Final  . Monocytes Absolute 02/28/2015 0.1  0.1 - 1.0 K/uL Final  . Eosinophils Relative 02/28/2015 1  0 - 5 % Final  . Eosinophils Absolute 02/28/2015 0.0  0.0 - 0.7 K/uL Final  . Basophils Relative 02/28/2015 0  0 - 1 % Final  . Basophils Absolute 02/28/2015 0.0  0.0 - 0.1 K/uL Final  Appointment on 02/28/2015  Component  Date Value Ref Range Status  . WBC 02/28/2015 3.3* 4.0 - 10.3 10e3/uL Final  . NEUT# 02/28/2015 3.0  1.5 - 6.5 10e3/uL Final  . HGB 02/28/2015 11.9* 13.0 - 17.1 g/dL Final  . HCT 02/28/2015 38.2* 38.4 - 49.9 % Final  . Platelets 02/28/2015 60* 140 - 400 10e3/uL Final  . MCV 02/28/2015 77.3* 79.3 - 98.0 fL Final  . MCH 02/28/2015 24.1* 27.2 - 33.4 pg Final  . MCHC 02/28/2015 31.1*  32.0 - 36.0 g/dL Final  . RBC 02/28/2015 4.94  4.20 - 5.82 10e6/uL Final  . RDW 02/28/2015 19.9* 11.0 - 14.6 % Final  . lymph# 02/28/2015 0.2* 0.9 - 3.3 10e3/uL Final  . MONO# 02/28/2015 0.1  0.1 - 0.9 10e3/uL Final  . Eosinophils Absolute 02/28/2015 0.0  0.0 - 0.5 10e3/uL Final  . Basophils Absolute 02/28/2015 0.0  0.0 - 0.1 10e3/uL Final  . NEUT% 02/28/2015 89.0* 39.0 - 75.0 % Final  . LYMPH% 02/28/2015 7.5* 14.0 - 49.0 % Final  . MONO% 02/28/2015 2.2  0.0 - 14.0 % Final  . EOS% 02/28/2015 0.7  0.0 - 7.0 % Final  . BASO% 02/28/2015 0.6  0.0 - 2.0 % Final  . Sodium 02/28/2015 137  136 - 145 mEq/L Final  . Potassium 02/28/2015 >9.0* 3.5 - 5.1 mEq/L Final  . Chloride 02/28/2015 103  98 - 109 mEq/L Final  . CO2 02/28/2015 19* 22 - 29 mEq/L Final  . Glucose 02/28/2015 111  70 - 140 mg/dl Final  . BUN 02/28/2015 101.9* 7.0 - 26.0 mg/dL Final  . Creatinine 02/28/2015 9.2* 0.7 - 1.3 mg/dL Final  . Total Bilirubin 02/28/2015 0.57  0.20 - 1.20 mg/dL Final  . Alkaline Phosphatase 02/28/2015 96  40 - 150 U/L Final  . AST 02/28/2015 14  5 - 34 U/L Final  . ALT 02/28/2015 10  0 - 55 U/L Final  . Total Protein 02/28/2015 5.9* 6.4 - 8.3 g/dL Final  . Albumin 02/28/2015 2.9* 3.5 - 5.0 g/dL Final  . Calcium 02/28/2015 9.7  8.4 - 10.4 mg/dL Final  . Anion Gap 02/28/2015 14* 3 - 11 mEq/L Final  . EGFR 02/28/2015 5* >90 ml/min/1.73 m2 Final   eGFR is calculated using the CKD-EPI Creatinine Equation (2009)     RADIOGRAPHIC STUDIES: No results found.  ASSESSMENT/PLAN:    Acute renal insufficiency Patient noted to have increased creatinine from 0.9 with last lab draw up to critical value of 9.2 today.  Patient is obviously dehydrated-but difficult to imagine marked elevated creatinine caused by dehydration alone.  Patient will be transported to the emergency department for further evaluation and management.   Anorexia Patient's wife reports that patient has had minimal oral intake since this  past weekend.  He does appear dehydrated today.  Port-A-Cath access; and IV fluid hydration initiated while the cancer Center.  Patient will be transported to the emergency department for further evaluation and management.   Colon adenocarcinoma Patient completed cycle 4 of his FOLFOX chemotherapy on 02/23/2015.  Patient is scheduled to receive cycle 5 of the same chemotherapy regimen on 03/09/2015.  The plan is to continue holding the Avastin for the time being.   Dehydration Patient has had little oral intake since this past weekend.  He does appear very dehydrated today.  IV fluid rehydration was initiated while the cancer Center.  Patient will be transported to the emergency department for further evaluation and management.   Hyperkalemia Potassium has increased critical value of greater than  9 on today's lab draw.  Patient does admittedly appear dehydrated; complaining of no appetite.  He denies any chest pain, chest pressure, shortness of breath, or pain with inspiration.  Patient will be transported to the emergency department for further evaluation and management of critical findings of hyperkalemia and acute renal insufficiency; as well as dehydration and weakness.  Brief history and report was given to the emergency department charge nurse [place].  Patient was transported to the emergency department via wheelchair per the [facility].   Nausea with vomiting Patient has been complaining of chronic nausea and intermittent vomiting since receiving his last cycle of chemotherapy on 02/23/2015.  He states the taken Zofran has helped little.  Patient does appear very dehydrated and weak today.   Thrombocytopenia Platelet count has decreased to 60.  Most likely, this is secondary to recent chemotherapy.  Patient denies any worsening issues with either easy bleeding or bruising.   Weakness Patient is reporting chronic nausea and intermittent vomiting; with little oral intake and  subsequent dehydration since receiving his last cycle of chemotherapy on 02/23/2015.  On exam-patient does appear extremely weak; but oriented 3.  Patient will be transported to the emergency department for further evaluation.   Patient stated understanding of all instructions; and was in agreement with this plan of care. The patient knows to call the clinic with any problems, questions or concerns.   This was a shared visit with Dr. Benay Spice today.   Total time spent with patient was 25 minutes;  with greater than 75 percent of that time spent in face to face counseling regarding patient's symptoms,  and coordination of care and follow up.  Disclaimer: This note was dictated with voice recognition software. Similar sounding words can inadvertently be transcribed and may not be corrected upon review.   Drue Second, NP 02/28/2015   This was a shared visit with Drue Second. Mr. Devan was interviewed and examined.  He presents with nausea and clinical evidence of dehydration. He has developed renal failure. The renal failure could be related to profound dehydration with acute renal injury versus another etiology. Mr. Christopher will be referred to the emergency room for further evaluation.  I discussed CPR and ACLS issues with him. He would like to remain on a full CODE STATUS for now.  Julieanne Manson, M.D.

## 2015-03-01 ENCOUNTER — Inpatient Hospital Stay (HOSPITAL_COMMUNITY): Payer: Medicare Other

## 2015-03-01 DIAGNOSIS — C787 Secondary malignant neoplasm of liver and intrahepatic bile duct: Secondary | ICD-10-CM

## 2015-03-01 DIAGNOSIS — C786 Secondary malignant neoplasm of retroperitoneum and peritoneum: Secondary | ICD-10-CM

## 2015-03-01 DIAGNOSIS — G893 Neoplasm related pain (acute) (chronic): Secondary | ICD-10-CM

## 2015-03-01 DIAGNOSIS — R110 Nausea: Secondary | ICD-10-CM

## 2015-03-01 DIAGNOSIS — K311 Adult hypertrophic pyloric stenosis: Secondary | ICD-10-CM

## 2015-03-01 DIAGNOSIS — D6959 Other secondary thrombocytopenia: Secondary | ICD-10-CM

## 2015-03-01 DIAGNOSIS — N179 Acute kidney failure, unspecified: Secondary | ICD-10-CM

## 2015-03-01 DIAGNOSIS — E119 Type 2 diabetes mellitus without complications: Secondary | ICD-10-CM

## 2015-03-01 DIAGNOSIS — C155 Malignant neoplasm of lower third of esophagus: Secondary | ICD-10-CM

## 2015-03-01 DIAGNOSIS — D6481 Anemia due to antineoplastic chemotherapy: Secondary | ICD-10-CM

## 2015-03-01 DIAGNOSIS — C189 Malignant neoplasm of colon, unspecified: Secondary | ICD-10-CM

## 2015-03-01 LAB — BASIC METABOLIC PANEL
Anion gap: 15 (ref 5–15)
BUN: 110 mg/dL — ABNORMAL HIGH (ref 6–23)
CALCIUM: 9.4 mg/dL (ref 8.4–10.5)
CHLORIDE: 105 mmol/L (ref 96–112)
CO2: 18 mmol/L — ABNORMAL LOW (ref 19–32)
CREATININE: 9.97 mg/dL — AB (ref 0.50–1.35)
GFR calc Af Amer: 5 mL/min — ABNORMAL LOW (ref >90)
GFR, EST NON AFRICAN AMERICAN: 4 mL/min — AB (ref >90)
GLUCOSE: 143 mg/dL — AB (ref 70–99)
POTASSIUM: 6.9 mmol/L — AB (ref 3.5–5.1)
Sodium: 138 mmol/L (ref 135–145)

## 2015-03-01 LAB — CBC
HCT: 28.9 % — ABNORMAL LOW (ref 39.0–52.0)
Hemoglobin: 9.4 g/dL — ABNORMAL LOW (ref 13.0–17.0)
MCH: 24.9 pg — AB (ref 26.0–34.0)
MCHC: 32.5 g/dL (ref 30.0–36.0)
MCV: 76.5 fL — ABNORMAL LOW (ref 78.0–100.0)
PLATELETS: 39 10*3/uL — AB (ref 150–400)
RBC: 3.78 MIL/uL — ABNORMAL LOW (ref 4.22–5.81)
RDW: 18.8 % — AB (ref 11.5–15.5)
WBC: 3.8 10*3/uL — ABNORMAL LOW (ref 4.0–10.5)

## 2015-03-01 LAB — COMPREHENSIVE METABOLIC PANEL
ALBUMIN: 2.4 g/dL — AB (ref 3.5–5.2)
ALT: 11 U/L (ref 0–53)
AST: 16 U/L (ref 0–37)
Alkaline Phosphatase: 72 U/L (ref 39–117)
Anion gap: 9 (ref 5–15)
BUN: 107 mg/dL — ABNORMAL HIGH (ref 6–23)
CO2: 24 mmol/L (ref 19–32)
CREATININE: 10.26 mg/dL — AB (ref 0.50–1.35)
Calcium: 9.8 mg/dL (ref 8.4–10.5)
Chloride: 108 mmol/L (ref 96–112)
GFR calc Af Amer: 5 mL/min — ABNORMAL LOW (ref >90)
GFR, EST NON AFRICAN AMERICAN: 4 mL/min — AB (ref >90)
GLUCOSE: 145 mg/dL — AB (ref 70–99)
POTASSIUM: 7 mmol/L — AB (ref 3.5–5.1)
Sodium: 141 mmol/L (ref 135–145)
Total Bilirubin: 0.5 mg/dL (ref 0.3–1.2)
Total Protein: 4.7 g/dL — ABNORMAL LOW (ref 6.0–8.3)

## 2015-03-01 LAB — POCT I-STAT, CHEM 8
BUN: 61 mg/dL — ABNORMAL HIGH (ref 6–23)
CALCIUM ION: 1.27 mmol/L (ref 1.13–1.30)
Chloride: 103 mmol/L (ref 96–112)
Creatinine, Ser: 6.5 mg/dL — ABNORMAL HIGH (ref 0.50–1.35)
GLUCOSE: 115 mg/dL — AB (ref 70–99)
HCT: 31 % — ABNORMAL LOW (ref 39.0–52.0)
Hemoglobin: 10.5 g/dL — ABNORMAL LOW (ref 13.0–17.0)
POTASSIUM: 5.1 mmol/L (ref 3.5–5.1)
Sodium: 140 mmol/L (ref 135–145)
TCO2: 21 mmol/L (ref 0–100)

## 2015-03-01 LAB — GLUCOSE, CAPILLARY
GLUCOSE-CAPILLARY: 110 mg/dL — AB (ref 70–99)
GLUCOSE-CAPILLARY: 96 mg/dL (ref 70–99)
Glucose-Capillary: 116 mg/dL — ABNORMAL HIGH (ref 70–99)
Glucose-Capillary: 125 mg/dL — ABNORMAL HIGH (ref 70–99)
Glucose-Capillary: 71 mg/dL (ref 70–99)

## 2015-03-01 MED ORDER — CETYLPYRIDINIUM CHLORIDE 0.05 % MT LIQD
7.0000 mL | Freq: Two times a day (BID) | OROMUCOSAL | Status: DC
  Administered 2015-03-02 – 2015-03-10 (×4): 7 mL via OROMUCOSAL

## 2015-03-01 MED ORDER — HEPARIN SODIUM (PORCINE) 1000 UNIT/ML DIALYSIS: 1000.0000 [IU] | INTRAMUSCULAR | Status: DC | PRN

## 2015-03-01 MED ORDER — CHLORHEXIDINE GLUCONATE 0.12 % MT SOLN
15.0000 mL | Freq: Two times a day (BID) | OROMUCOSAL | Status: DC
  Administered 2015-03-01 – 2015-03-09 (×14): 15 mL via OROMUCOSAL
  Filled 2015-03-01 (×22): qty 15

## 2015-03-01 MED ORDER — SODIUM CHLORIDE 0.9 % IV SOLN: 100.0000 mL | INTRAVENOUS | Status: DC | PRN

## 2015-03-01 MED ORDER — ALTEPLASE 2 MG IJ SOLR
2.0000 mg | Freq: Once | INTRAMUSCULAR | Status: DC
  Filled 2015-03-01: qty 2

## 2015-03-01 MED ORDER — SODIUM CHLORIDE 0.9 % IJ SOLN
10.0000 mL | INTRAMUSCULAR | Status: DC | PRN
  Administered 2015-03-01 – 2015-03-04 (×4): 10 mL
  Administered 2015-03-07 (×2): 20 mL
  Administered 2015-03-08: 30 mL
  Administered 2015-03-10: 20 mL
  Administered 2015-03-10: 10 mL
  Filled 2015-03-01 (×9): qty 40

## 2015-03-01 MED ORDER — LIDOCAINE-PRILOCAINE 2.5-2.5 % EX CREA
1.0000 "application " | TOPICAL_CREAM | CUTANEOUS | Status: DC | PRN
  Filled 2015-03-01: qty 5

## 2015-03-01 MED ORDER — NEPRO/CARBSTEADY PO LIQD
237.0000 mL | ORAL | Status: DC | PRN
  Filled 2015-03-01: qty 237

## 2015-03-01 MED ORDER — DEXTROSE 50 % IV SOLN
INTRAVENOUS | Status: AC
  Administered 2015-03-01: 02:00:00
  Filled 2015-03-01: qty 50

## 2015-03-01 MED ORDER — PENTAFLUOROPROP-TETRAFLUOROETH EX AERO: 1.0000 "application " | INHALATION_SPRAY | CUTANEOUS | Status: DC | PRN

## 2015-03-01 MED ORDER — DEXTROSE 50 % IV SOLN
INTRAVENOUS | Status: AC
  Filled 2015-03-01: qty 50

## 2015-03-01 MED ORDER — ALTEPLASE 2 MG IJ SOLR
2.0000 mg | Freq: Once | INTRAMUSCULAR | Status: DC | PRN
  Filled 2015-03-01: qty 2

## 2015-03-01 MED ORDER — SODIUM POLYSTYRENE SULFONATE 15 GM/60ML PO SUSP
45.0000 g | Freq: Once | ORAL | Status: DC
  Filled 2015-03-01: qty 180

## 2015-03-01 MED ORDER — LIDOCAINE HCL (PF) 1 % IJ SOLN: 5.0000 mL | INTRAMUSCULAR | Status: DC | PRN

## 2015-03-01 NOTE — Progress Notes (Signed)
Critical potassium of 7.0 called by lab. Physician notified.

## 2015-03-01 NOTE — Progress Notes (Signed)
Patient just declined for the second time to have a Foley catheter inserted despite extensive education. At each attempt, patient makes the effort to void a small amount in the urinal. Will try again shortly before the end of the shift.

## 2015-03-01 NOTE — Progress Notes (Signed)
ptto dialysis, first time.. Pt stable, no complaints or concerns stated at this time

## 2015-03-01 NOTE — Progress Notes (Signed)
Pt returned from renal ultrasound, no complaints or concerns stated

## 2015-03-01 NOTE — Progress Notes (Signed)
INITIAL NUTRITION ASSESSMENT  DOCUMENTATION CODES Per approved criteria  -Severe malnutrition in the context of chronic illness   Pt meets criteria for severe MALNUTRITION in the context of chronic illness as evidenced by 10% wt loss x 6 months, <75% estimated energy intake x 1 month.  INTERVENTION: -RD to follow for diet advancement  NUTRITION DIAGNOSIS: Malnutrition related to decreased oral intake, altered taste perception as evidenced by diet hx, 10% wt loss x 6 months.   Goal: Pt will meet >90% of estimated nutritional needs  Monitor:  Diet advancement, PO intake, labs, weight changes, I/O's  Reason for Assessment: MST=4  74 y.o. male  Admitting Dx: <principal problem not specified>  74 y/o male with history of colon adenocarcinoma with metastasis to gastric antrum on chemo (Dr. Benay [name]), COPD, hyponatremia, diabetes mellitus presented to the [place] [facility] today for an appointment secondary to one-week history of nausea and anorexia.  ASSESSMENT: Spoke with pt wife at bedside, who requested that I not awake pt at this time, as he was very sleepy from HD treatment. Nutrition-focused physical exam deferred at this time.  Per pt wife, pt with very poor appetite PTA. She reveals ongoing wt loss and poor appetite over the past 6-9 months, as a result of recent dental procedures and side effects of chemotherapy. She is unsure of his UBW. Wt hx reveals a 10% wt loss over the past 6 months, which is significant.  She reports he eats very little at home, but typically eats a soft diet, revealing he experiences abdominal pain when consuming higher fiber foods. Meats are ground or chopped for ease of intake. He has been experiencing altered taste perception and often eats the same thing for several days, most recently chili.  Pt wife reports that pt does not like any nutritional supplements. They have tried Boost, Ensure, Nepro, and [employer] and he dislikes the sweet taste of  the supplements.  Encouraged pt wife to encouraged pt to consume meals to promote healing. Also encouraged her to offer small, frequent meals at home in attempt to maximize nutritional intake. Discussed high calorie, high protein snacks to offer pt, as well as to provide pt with favorite foods at home and take advantage of when pt's appetite is good.  Labs reviewed. BUN/Creat: 61/6.50, Glucose: 115. CBGS: 110-125.   Height: Ht Readings from Last 1 Encounters:  02/28/15 5\' 7"  (1.702 m)    Weight: Wt Readings from Last 1 Encounters:  02/28/15 144 lb 6.4 oz (65.5 kg)    Ideal Body Weight: 148#  % Ideal Body Weight: 97%  Wt Readings from Last 50 Encounters:  02/28/15 144 lb 6.4 oz (65.5 kg)  02/23/15 139 lb 11.2 oz (63.368 kg)  02/09/15 136 lb 14.4 oz (62.097 kg)  02/01/15 137 lb 8 oz (62.37 kg)  01/26/15 141 lb 12.8 oz (64.32 kg)  01/12/15 139 lb 9.6 oz (63.322 kg)  01/02/15 149 lb 8 oz (67.813 kg)  12/30/14 149 lb 3.2 oz (67.677 kg)  12/29/14 150 lb 12.8 oz (68.402 kg)  12/01/14 152 lb 8 oz (69.174 kg)  12/01/14 152 lb 8 oz (69.174 kg)  11/10/14 152 lb (68.947 kg)  10/27/14 154 lb 11.2 oz (70.171 kg)  10/06/14 153 lb 12.8 oz (69.763 kg)  09/22/14 158 lb 1.6 oz (71.714 kg)  09/15/14 153 lb 1.6 oz (69.446 kg)  09/01/14 160 lb 4.8 oz (72.712 kg)  08/18/14 162 lb 4.8 oz (73.619 kg)  07/28/14 168 lb 8 oz (76.431  kg)  07/15/14 165 lb 11.2 oz (75.161 kg)  07/13/14 164 lb 9.6 oz (74.662 kg)  06/28/14 162 lb 6.4 oz (73.664 kg)  06/14/14 168 lb 11.2 oz (76.522 kg)  06/03/14 172 lb 11.2 oz (78.336 kg)  05/12/14 177 lb 1.6 oz (80.332 kg)  05/07/14 173 lb 8 oz (78.699 kg)  05/04/14 175 lb (79.379 kg)  05/03/14 175 lb (79.379 kg)  04/14/14 174 lb 14.4 oz (79.334 kg)  03/01/14 172 lb 14.4 oz (78.427 kg)  01/20/14 169 lb 3.2 oz (76.749 kg)  12/07/13 176 lb 8 oz (80.06 kg)  10/08/13 172 lb (78.019 kg)  08/31/13 167 lb 8 oz (75.978 kg)  08/03/13 167 lb 9.6 oz (76.023 kg)  06/22/13  176 lb 6.4 oz (80.015 kg)  05/13/13 181 lb 11.2 oz (82.419 kg)  04/13/13 180 lb 1.6 oz (81.693 kg)  02/20/13 183 lb 12.8 oz (83.371 kg)  01/09/13 180 lb 8 oz (81.874 kg)  12/05/12 175 lb 8 oz (79.606 kg)  10/24/12 174 lb 4.8 oz (79.062 kg)  09/12/12 169 lb 14.4 oz (77.066 kg)  08/29/12 171 lb 2 oz (77.622 kg)  08/08/12 169 lb 11.2 oz (76.975 kg)  07/09/12 166 lb (75.297 kg)  07/04/12 171 lb 1.6 oz (77.61 kg)  05/28/12 165 lb 3.2 oz (74.934 kg)  05/16/12 167 lb 14.4 oz (76.159 kg)  03/18/12 171 lb 6.4 oz (77.747 kg)   Usual Body Weight: 180#  % Usual Body Weight: 80%  BMI:  Body mass index is 22.61 kg/(m^2). Normal weight range  Estimated Nutritional Needs: Kcal: 2000-2200 Protein: 90-100 grams Fluid: 2.0-2.2 L  Skin: WDL  Diet Order: Diet clear liquid  EDUCATION NEEDS: -Education needs addressed   Intake/Output Summary (Last 24 hours) at 03/01/15 1438 Last data filed at 03/01/15 1053  Gross per 24 hour  Intake    130 ml  Output    325 ml  Net   -195 ml    Last BM: 02/27/15  Labs:   Recent Labs Lab 02/28/15 2137 02/28/15 2319 03/01/15 0430 03/01/15 1005  NA 138 138 141 140  K 7.2* 6.9* 7.0* 5.1  CL 108 105 108 103  CO2 19 18* 24  --   BUN 113* 110* 107* 61*  CREATININE 9.94* 9.97* 10.26* 6.50*  CALCIUM 9.5 9.4 9.8  --   GLUCOSE 142* 143* 145* 115*    CBG (last 3)   Recent Labs  03/01/15 0125 03/01/15 0755 03/01/15 1206  GLUCAP 116* 125* 110*    Scheduled Meds: . alteplase  2 mg Intracatheter Once  . antiseptic oral rinse  7 mL Mouth Rinse q12n4p  . chlorhexidine  15 mL Mouth Rinse BID  . dorzolamide-timolol  1 drop Left Eye BID  . insulin aspart  0-9 Units Subcutaneous TID WC  . loteprednol  1 drop Left Eye QODAY  . pantoprazole  40 mg Oral Daily  . polyethylene glycol  17 g Oral Daily  . sodium chloride  3 mL Intravenous Q12H  . vitamin B-12  1,000 mcg Oral Q breakfast    Continuous Infusions: . sodium chloride 1,000 mL (02/28/15  1937)  . sodium chloride 100 mL/hr at 02/28/15 2335    Past Medical History  Diagnosis Date  . COPD (chronic obstructive pulmonary disease)   . Nephrolithiasis   . Diverticulosis   . Hx of adenomatous colonic polyps 04/30/07  . Glaucoma   . Hyperlipidemia   . ED (erectile dysfunction)   . Lung nodule   .  Hepatic steatosis   . Thoracic spondylosis   . SOB (shortness of breath)   . Bruises easily   . Hearing loss     slight  . Contact lens/glasses fitting   . Rectal bleeding   . Hemorrhoids   . Glaucoma   . 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 Anemia   . History of radiation therapy 05/25/14-06/14/14    gastric mets 37.5Gy/  . Non-small cell lung cancer   . Colon cancer   . 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 Diabetes mellitus     type II    Past Surgical History  Procedure Laterality Date  . 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 Lung removal, partial  05/2006    Lt upper lobe wedge resection  . Tonsillectomy    . Kidney stone removal  07/2006  . Colonoscopy    . Polypectomy    . Colon surgery  06/2011  . 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 Trigger finger release  08/29/2012    Procedure: RELEASE TRIGGER FINGER/A-1 PULLEY;  Surgeon: Cammie Sickle., MD;  Location: [facility];  Service: Orthopedics;  Laterality: Left;  release a1 pulley left long  . Radioactive seed implantation Bilateral 05/28/2006    40seeds, lung left upper   . Superficial peroneal nerve release    . Mouth surgery    . Esophagogastroduodenoscopy (egd) with propofol N/A 01/04/2015    Procedure: ESOPHAGOGASTRODUODENOSCOPY (EGD) WITH PROPOFOL with gastric stent;  Surgeon: Inda Castle, MD;  Location: [facility];  Service: Endoscopy;  Laterality: N/A;  . Duodenal stent placement N/A 01/04/2015    Procedure: DUODENAL STENT PLACEMENT;  Surgeon: Inda Castle, MD;  Location: [facility];  Service: Endoscopy;  Laterality: N/A;  .  Esophagogastroduodenoscopy N/A 01/06/2015    Procedure: ESOPHAGOGASTRODUODENOSCOPY (EGD)  with gastric/duodenal stent;  Surgeon: Inda Castle, MD;  Location: [facility];  Service: Endoscopy;  Laterality: N/A;  . Duodenal stent placement N/A 01/06/2015    Procedure: DUODENAL STENT PLACEMENT;  Surgeon: Inda Castle, MD;  Location: [facility];  Service: Endoscopy;  Laterality: N/A;    Raider Valbuena A. Jimmye Norman, RD, LDN, CDE Pager: [phone] After hours Pager: [phone]

## 2015-03-01 NOTE — Progress Notes (Signed)
Pt returned from dialysis, pt stable, no complaints or concerns stated

## 2015-03-01 NOTE — Progress Notes (Signed)
[facility] Progress Note   [name] JFH:[id number] DOB: 06-Apr-1941 DOA: 02/28/2015 PCP: Jerlyn Ly, MD  Brief narrative: [name] is a 74 y.o. 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 Benay Spice), COPD, hyponatremia, diabetes mellitus presented to the [place] [facility] for an appointment secondary to one-week history of nausea and anorexia. Routine blood work was performed and revealed that the patient had AKI with serum creatinine of 9.2 and potassium >7.5 for which he was admitted to the hospital.    Subjective: "feels bad" no other specific complaints.   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   DM - cont sliding scale - last A1c 01/06/15 was 6.4    Colon adenocarcinoma - with GOO s/p stent 1/16 - evaluated by Dr Learta Codding today    Thrombocytopenia - Dr Benay Spice recommended to transfuse Plt if count < 10,000 and hold ASA until recovery seen    Code Status: full code Family Communication:  Disposition Plan: to be determined DVT prophylaxis: SCDs Consultants:Neprhology, oncology Procedures: HD cath  Antibiotics: Anti-infectives    None      Objective: Filed Weights   02/28/15 2231  Weight: 65.5 kg (144 lb 6.4 oz)    Intake/Output Summary (Last 24 hours) at 03/01/15 1539 Last data filed at 03/01/15 1053  Gross per 24 hour  Intake    130 ml  Output    325 ml  Net   -195 ml     Vitals Filed Vitals:   03/01/15 1000 03/01/15 1030 03/01/15 1053 03/01/15 1437  BP: 123/74 123/72 134/76 144/74  Pulse: 110 110 107   Temp:   97.9 F (36.6 C) 98.5 F (36.9 C)  TempSrc:   Oral Oral  Resp:   24 16  Height:      Weight:      SpO2:   96% 95%     Exam:  General:  Pt is alert, not in acute distress  HEENT: No icterus, No thrush  Cardiovascular: regular rate and rhythm, S1/S2 No murmur  Respiratory: clear to auscultation bilaterally   Abdomen: Soft, +Bowel sounds, non tender, non distended, no guarding  MSK: No LE edema, cyanosis or clubbing  Data Reviewed: Basic Metabolic Panel:  Recent Labs Lab 02/28/15 1211 02/28/15 1639 02/28/15 2137 02/28/15 2319 03/01/15 0430 03/01/15 1005  NA 137 136 138 138 141 140  K >9.0* >7.5* 7.2* 6.9* 7.0* 5.1  CL  --  104 108 105 108 103  CO2 19* 21 19 18* 24  --   GLUCOSE 111 122* 142* 143* 145* 115*  BUN 101.9* 111* 113* 110* 107* 61*  CREATININE 9.2* 9.78* 9.94* 9.97* 10.26* 6.50*  CALCIUM 9.7 9.1 9.5 9.4 9.8  --    Liver Function Tests:  Recent Labs Lab 02/23/15 1057 02/28/15 1211 02/28/15 1639 03/01/15 0430  AST 12 14 17 16   ALT 6 10 13 11   ALKPHOS 102 96 86 72  BILITOT 0.44 0.57 0.7 0.5  PROT 5.9* 5.9* 5.9* 4.7*  ALBUMIN 3.0* 2.9* 3.2* 2.4*   No results for input(s): LIPASE, AMYLASE in the last 168 hours. No results for input(s): AMMONIA in the last 168 hours. CBC:  Recent Labs Lab 02/23/15 1056 02/28/15  1210 02/28/15 1454 03/01/15 0430 03/01/15 1005  WBC 6.6 3.3* 3.3* 3.8*  --   NEUTROABS 5.0 3.0 2.9  --   --   HGB 11.6* 11.9* 11.6* 9.4* 10.5*  HCT 37.9* 38.2* 36.5* 28.9* 31.0*  MCV 79.9 77.3* 77.5* 76.5*  --   PLT 190 60* 47* 39*  --    Cardiac Enzymes: No results for input(s): CKTOTAL, CKMB, CKMBINDEX, TROPONINI in the last 168 hours. BNP (last 3 results) No results for input(s): BNP in the last 8760 hours.  ProBNP (last 3 results) No results for input(s): PROBNP in the last 8760 hours.  CBG:  Recent Labs Lab 03/01/15 0125 03/01/15 0755 03/01/15 1206  GLUCAP 116* 125* 110*    No results found for this or any previous visit (from the past 240 hour(s)).   Studies:  Recent x-ray studies have been reviewed in detail by the  Attending Physician  Scheduled Meds:  Scheduled Meds: . alteplase  2 mg Intracatheter Once  . antiseptic oral rinse  7 mL Mouth Rinse q12n4p  . chlorhexidine  15 mL Mouth Rinse BID  . dorzolamide-timolol  1 drop Left Eye BID  . insulin aspart  0-9 Units Subcutaneous TID WC  . loteprednol  1 drop Left Eye QODAY  . pantoprazole  40 mg Oral Daily  . polyethylene glycol  17 g Oral Daily  . sodium chloride  3 mL Intravenous Q12H  . vitamin B-12  1,000 mcg Oral Q breakfast   Continuous Infusions: . sodium chloride 1,000 mL (02/28/15 1937)  . sodium chloride 100 mL/hr at 02/28/15 2335    Time spent on care of this patient: 29 min   [place], MD 03/01/2015, 3:39 PM  LOS: 1 day   [facility] Office  [phone] Pager - Text Page per [email or web address]  If 7PM-7AM, please contact night-coverage [email or web address]

## 2015-03-01 NOTE — Progress Notes (Addendum)
IP PROGRESS NOTE  Subjective:   He continues to have nausea. Minimal urine output. The upper abdominal pain has improved since beginning FOLFOX.  Objective: Vital signs in last 24 hours: Blood pressure 141/73, pulse 106, temperature 97.9 F (36.6 C), temperature source Oral, resp. rate 18, height 5\' 7"  (1.702 m), weight 144 lb 6.4 oz (65.5 kg), SpO2 90 %.  Intake/Output from previous day: 03/07 0701 - 03/08 0700 In: 130 [P.O.:120; I.V.:10] Out: 325 [Urine:325]  Physical Exam:  HEENT: No thrush Lungs: Clear bilaterally Cardiac: Regular rate and rhythm Abdomen: Soft, no hepatosplenomegaly, slight fullness in the supra umbilical area, nontender Extremities: Trace pedal edema bilaterally Neurologic: Alert and oriented  Portacath/PICC-without erythema  Lab Results:  Recent Labs  02/28/15 1454 03/01/15 0430  WBC 3.3* 3.8*  HGB 11.6* 9.4*  HCT 36.5* 28.9*  PLT 47* PENDING    BMET  Recent Labs  02/28/15 2319 03/01/15 0430  NA 138 141  K 6.9* 7.0*  CL 105 108  CO2 18* 24  GLUCOSE 143* 145*  BUN 110* 107*  CREATININE 9.97* 10.26*  CALCIUM 9.4 9.8    Studies/Results: Dg Chest Port 1 View  03/01/2015   CLINICAL DATA:  Central line placement.  EXAM: PORTABLE CHEST - 1 VIEW  COMPARISON:  CT chest 11/09/2014  FINDINGS: Shallow inspiration. Atelectasis in the lung bases. Heart size and pulmonary vascularity are normal for technique. Power port toe Infuse-A-Port with tip in the cavoatrial junction. Left central venous catheter with tip over the mid SVC region. No pneumothorax. Surgical clips in the left chest.  IMPRESSION: Appliances appear to be in satisfactory location. Shallow inspiration with atelectasis in the lung bases.   Electronically Signed   By: Lucienne Capers M.D.   On: 03/01/2015 01:31    Medications: I have reviewed the patient's current medications.  Assessment/Plan:  1. 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  Diabetes  3.  Upper abdominal pain secondary to metastatic colon cancer-improved  4.   Gastric outlet obstruction secondary to metastatic colon cancer, status post placement of a gastric/duodenal stents on 01/04/2015 and 01/06/2015  5.   Acute renal failure  6.  Anemia/thrombocytopenia secondary to chemotherapy   Mr. Gage appears stable. He has acute renal failure, potentially related to dehydration complicated by chemotherapy. He does not appear to have a systemic infection or hemolytic uremic syndrome to account for the renal failure/thrombocytopenia.  Recommendations: 1. Evaluation/management of renal failure per nephrology 2. Follow-up platelet count, transfuse platelets for bleeding or a count of less than 10,000 3. decision on further chemotherapy pending his recovery from the acute renal failure 4. Hold aspirin until the platelet count recovers   LOS: 1 day   [place]  03/01/2015, 8:39 AM

## 2015-03-01 NOTE — Progress Notes (Signed)
Patient ID: [name], male   DOB: [birth year], 74 y.o.   MRN: [id number]  [facility] Progress Note    Assessment/ Plan:   1. AKI: suspected to be secondary to Oxaliplatin nephrotoxicity and volume depletion- renal ultrasound pending. Poor UOP and on HD today. Will continue daily monitoring for renal recovery v/s triggers for HD 2. Hyperkalemia: failed medical management overnight- HD today 3. Hypertension: fair BP control, limited UF at HD to allow for renal perfusion. 4. Metastatic colon cancer: s/p FOLFOX with clinical improvement but AKI-- await renal recovery prior to redose Tx 5. Anemia/thrombocytopenia: transfusion triggers per oncology  Subjective:   Reports to be tired this morning and without any CP/SOB   Objective:   BP 141/73 mmHg  Pulse 106  Temp(Src) 97.9 F (36.6 C) (Oral)  Resp 18  Ht 5\' 7"  (1.702 m)  Wt 65.5 kg (144 lb 6.4 oz)  BMI 22.61 kg/m2  SpO2 90%  Intake/Output Summary (Last 24 hours) at 03/01/15 0908 Last data filed at 03/01/15 0618  Gross per 24 hour  Intake    130 ml  Output    325 ml  Net   -195 ml   Weight change:   Physical Exam: ZHG:[id number] resting on HD QQI:WLNLG RRR, normal s1 and s2 Resp:CTA bilaterally, no rales XQJ:JHER, flat, NT, BS normal Ext:No LE edema  Imaging: Dg Chest Port 1 View  03/01/2015   CLINICAL DATA:  Central line placement.  EXAM: PORTABLE CHEST - 1 VIEW  COMPARISON:  CT chest 11/09/2014  FINDINGS: Shallow inspiration. Atelectasis in the lung bases. Heart size and pulmonary vascularity are normal for technique. Power port toe Infuse-A-Port with tip in the cavoatrial junction. Left central venous catheter with tip over the mid SVC region. No pneumothorax. Surgical clips in the left chest.  IMPRESSION: Appliances appear to be in satisfactory location. Shallow inspiration with atelectasis in the lung bases.   Electronically Signed   By: Lucienne Capers M.D.   On: 03/01/2015 01:31     Labs: BMET  Recent Labs Lab 02/23/15 1057 02/28/15 1211 02/28/15 1639 02/28/15 2137 02/28/15 2319 03/01/15 0430  NA 140 137 136 138 138 141  K 4.0 >9.0* >7.5* 7.2* 6.9* 7.0*  CL  --   --  104 108 105 108  CO2 25 19* 21 19 18* 24  GLUCOSE 126 111 122* 142* 143* 145*  BUN 11.9 101.9* 111* 113* 110* 107*  CREATININE 0.9 9.2* 9.78* 9.94* 9.97* 10.26*  CALCIUM 9.5 9.7 9.1 9.5 9.4 9.8   CBC  Recent Labs Lab 02/23/15 1056 02/28/15 1210 02/28/15 1454 03/01/15 0430  WBC 6.6 3.3* 3.3* 3.8*  NEUTROABS 5.0 3.0 2.9  --   HGB 11.6* 11.9* 11.6* 9.4*  HCT 37.9* 38.2* 36.5* 28.9*  MCV 79.9 77.3* 77.5* 76.5*  PLT 190 60* 47* 39*    Medications:    . alteplase  2 mg Intracatheter Once  . antiseptic oral rinse  7 mL Mouth Rinse q12n4p  . chlorhexidine  15 mL Mouth Rinse BID  . dextrose      . dorzolamide-timolol  1 drop Left Eye BID  . insulin aspart  0-9 Units Subcutaneous TID WC  . loteprednol  1 drop Left Eye QODAY  . pantoprazole  40 mg Oral Daily  . polyethylene glycol  17 g Oral Daily  . sodium chloride  3 mL Intravenous Q12H  . vitamin B-12  1,000 mcg Oral Q breakfast    Elmarie Shiley, MD 03/01/2015, 9:08 AM

## 2015-03-01 NOTE — Procedures (Signed)
Hemodialysis Catheter Insertion Procedure Note SHAMOND SKELTON [id number] [birth year]  Procedure: Insertion of Hemodialysis Catheter Indications: Hemodialysis  Procedure Details Consent: Risks of procedure as well as the alternatives and risks of each were explained to the (patient/caregiver).  Consent for procedure obtained. Time Out: Verified patient identification, verified procedure, site/side was marked, verified correct patient position, special equipment/implants available, medications/allergies/relevent history reviewed, required imaging and test results available.  Performed  Maximum sterile technique was used including antiseptics, cap, gloves, gown, hand hygiene, mask and sheet. Skin prep: Chlorhexidine; local anesthetic administered A antimicrobial bonded/coated triple lumen catheter was placed in the left internal jugular vein using the Seldinger technique.  Evaluation Blood flow good Complications: No apparent complications Patient did tolerate procedure well. Chest X-ray ordered to verify placement.  CXR: pending.  Procedure performed under direct ultrasound guidance for real time vessel cannulation.      Montey Hora, [facility] Pgr: [phone]  or [phone] 03/01/2015, 12:45 AM

## 2015-03-01 NOTE — Procedures (Signed)
Patient seen on Hemodialysis. QB 250, UF goal 500 Treatment adjusted as needed.  Elmarie Shiley MD [facility]. Office # [phone] Pager # [phone] 9:15 AM

## 2015-03-02 LAB — HEPATITIS B CORE ANTIBODY, TOTAL: HEP B C TOTAL AB: NEGATIVE

## 2015-03-02 LAB — BASIC METABOLIC PANEL
Anion gap: 8 (ref 5–15)
BUN: 68 mg/dL — AB (ref 6–23)
CHLORIDE: 106 mmol/L (ref 96–112)
CO2: 27 mmol/L (ref 19–32)
CREATININE: 7.77 mg/dL — AB (ref 0.50–1.35)
Calcium: 9 mg/dL (ref 8.4–10.5)
GFR calc Af Amer: 7 mL/min — ABNORMAL LOW (ref >90)
GFR calc non Af Amer: 6 mL/min — ABNORMAL LOW (ref >90)
GLUCOSE: 76 mg/dL (ref 70–99)
Potassium: 5.9 mmol/L — ABNORMAL HIGH (ref 3.5–5.1)
SODIUM: 141 mmol/L (ref 135–145)

## 2015-03-02 LAB — CBC
HEMATOCRIT: 29.4 % — AB (ref 39.0–52.0)
HEMOGLOBIN: 9.3 g/dL — AB (ref 13.0–17.0)
MCH: 24.8 pg — AB (ref 26.0–34.0)
MCHC: 31.6 g/dL (ref 30.0–36.0)
MCV: 78.4 fL (ref 78.0–100.0)
Platelets: 44 10*3/uL — ABNORMAL LOW (ref 150–400)
RBC: 3.75 MIL/uL — AB (ref 4.22–5.81)
RDW: 18.9 % — ABNORMAL HIGH (ref 11.5–15.5)
WBC: 5.4 10*3/uL (ref 4.0–10.5)

## 2015-03-02 LAB — HEPATITIS B SURFACE ANTIBODY,QUALITATIVE: Hep B S Ab: NONREACTIVE

## 2015-03-02 LAB — GLUCOSE, CAPILLARY
Glucose-Capillary: 76 mg/dL (ref 70–99)
Glucose-Capillary: 86 mg/dL (ref 70–99)
Glucose-Capillary: 97 mg/dL (ref 70–99)
Glucose-Capillary: 88 mg/dL (ref 70–99)

## 2015-03-02 LAB — HEPATITIS B SURFACE ANTIGEN: Hepatitis B Surface Ag: NEGATIVE

## 2015-03-02 LAB — HEPATITIS B CORE ANTIBODY, IGM: Hep B C IgM: NONREACTIVE

## 2015-03-02 NOTE — Progress Notes (Signed)
Patient ID: [name], male   DOB: 28-Jan-1941, 74 y.o.   MRN: [id number]  [facility] Progress Note    Assessment/ Plan:   1. AKI: suspected to be secondary to Oxaliplatin nephrotoxicity and volume depletion/ATN- renal ultrasound reviewed and found to be negative for any obstruction/hydronephrosis. Poor UOP overnight (550 mL) and with rising creatinine/hyperkalemia this morning. Plan for hemodialysis again today for clearance/correction of hyperkalemia.  2. Hyperkalemia: Rebound hyperkalemia noted after dialysis yesterday-plan for dialysis again today 3. Hypertension: fair BP control, limited UF at HD to allow for renal perfusion. 4. Metastatic colon cancer: s/p FOLFOX with clinical improvement but AKI-- await renal recovery prior to redose Tx 5. Anemia/thrombocytopenia: transfusion triggers per oncology  Subjective:   Reports to be feeling fair-had some abdominal discomfort overnight    Objective:   BP 134/63 mmHg  Pulse 83  Temp(Src) 97.4 F (36.3 C) (Oral)  Resp 12  Ht 5\' 7"  (1.702 m)  Wt 65.5 kg (144 lb 6.4 oz)  BMI 22.61 kg/m2  SpO2 97%  Intake/Output Summary (Last 24 hours) at 03/02/15 1008 Last data filed at 03/02/15 0802  Gross per 24 hour  Intake    103 ml  Output    550 ml  Net   -447 ml   Weight change:   Physical Exam: Gen: Comfortably resting in bed CVS: Pulse regular rate and rhythm Resp: Clear to auscultation, no rales Abd: Soft, flat, nontender Ext: No lower extremity edema  Imaging: US Renal  03/01/2015   CLINICAL DATA:  Acute kidney injury  EXAM: RENAL/URINARY TRACT ULTRASOUND COMPLETE  COMPARISON:  None.  FINDINGS: Right Kidney:  Length: 11.7 cm.  No mass or hydronephrosis.  Left Kidney:  Length: 12.2 cm.  No mass or hydronephrosis.  Bladder:  Within normal limits.  IMPRESSION: Negative renal ultrasound.   Electronically Signed   By: Julian Hy M.D.   On: 03/01/2015 17:21   Dg Chest Port 1 View  03/01/2015   CLINICAL DATA:   Central line placement.  EXAM: PORTABLE CHEST - 1 VIEW  COMPARISON:  CT chest 11/09/2014  FINDINGS: Shallow inspiration. Atelectasis in the lung bases. Heart size and pulmonary vascularity are normal for technique. Power port toe Infuse-A-Port with tip in the cavoatrial junction. Left central venous catheter with tip over the mid SVC region. No pneumothorax. Surgical clips in the left chest.  IMPRESSION: Appliances appear to be in satisfactory location. Shallow inspiration with atelectasis in the lung bases.   Electronically Signed   By: Lucienne Capers M.D.   On: 03/01/2015 01:31    Labs: BMET  Recent Labs Lab 02/23/15 1057 02/28/15 1211 02/28/15 1639 02/28/15 2137 02/28/15 2319 03/01/15 0430 03/01/15 1005 03/02/15 0511  NA 140 137 136 138 138 141 140 141  K 4.0 >9.0* >7.5* 7.2* 6.9* 7.0* 5.1 5.9*  CL  --   --  104 108 105 108 103 106  CO2 25 19* 21 19 18* 24  --  27  GLUCOSE 126 111 122* 142* 143* 145* 115* 76  BUN 11.9 101.9* 111* 113* 110* 107* 61* 68*  CREATININE 0.9 9.2* 9.78* 9.94* 9.97* 10.26* 6.50* 7.77*  CALCIUM 9.5 9.7 9.1 9.5 9.4 9.8  --  9.0   CBC  Recent Labs Lab 02/23/15 1056 02/28/15 1210 02/28/15 1454 03/01/15 0430 03/01/15 1005  WBC 6.6 3.3* 3.3* 3.8*  --   NEUTROABS 5.0 3.0 2.9  --   --   HGB 11.6* 11.9* 11.6* 9.4* 10.5*  HCT 37.9*  38.2* 36.5* 28.9* 31.0*  MCV 79.9 77.3* 77.5* 76.5*  --   PLT 190 60* 47* 39*  --     Medications:    . alteplase  2 mg Intracatheter Once  . antiseptic oral rinse  7 mL Mouth Rinse q12n4p  . chlorhexidine  15 mL Mouth Rinse BID  . dorzolamide-timolol  1 drop Left Eye BID  . insulin aspart  0-9 Units Subcutaneous TID WC  . loteprednol  1 drop Left Eye QODAY  . pantoprazole  40 mg Oral Daily  . polyethylene glycol  17 g Oral Daily  . sodium chloride  3 mL Intravenous Q12H  . vitamin B-12  1,000 mcg Oral Q breakfast   Elmarie Shiley, MD 03/02/2015, 10:08 AM

## 2015-03-02 NOTE — Progress Notes (Addendum)
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 Benay Spice), COPD, hyponatremia, diabetes mellitus presented to the [place] [facility] for an appointment secondary to one-week history of nausea and anorexia. Routine blood work was performed and revealed that the patient had AKI with serum creatinine of 9.2 and potassium >7.5 for which he was admitted to the hospital.    Subjective: Still feels quite weak. Mild nausea. No vomiting.   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   DM - cont sliding scale - last A1c 01/06/15 was 6.4    Colon adenocarcinoma - with GOO s/p stent 1/16 - evaluated by Dr Learta Codding in hospital    Thrombocytopenia - Dr Benay Spice recommended to transfuse Plt if count < 10,000 and hold ASA until recovery seen    Code Status: full code Family Communication:  Disposition Plan: to be determined DVT prophylaxis: SCDs Consultants:Neprhology, oncology Procedures: HD cath  Antibiotics: Anti-infectives    None      Objective: Filed Weights   02/28/15 2231  Weight: 65.5 kg (144 lb 6.4 oz)    Intake/Output Summary (Last 24 hours) at 03/02/15 1035 Last data filed at 03/02/15 0802  Gross per 24 hour  Intake    103 ml  Output    550 ml  Net   -447 ml     Vitals Filed Vitals:   03/01/15 1053 03/01/15 1437 03/01/15 2104 03/02/15 0514  BP: 134/76 144/74 126/69 134/63  Pulse: 107  99 83  Temp: 97.9 F (36.6 C) 98.5 F (36.9 C) 97.8 F (36.6 C) 97.4 F (36.3 C)  TempSrc: Oral Oral  Oral Oral  Resp: 24 16 12 12   Height:      Weight:      SpO2: 96% 95% 94% 97%    Exam:  General:  Pt is alert, not in acute distress  HEENT: No icterus, No thrush  Cardiovascular: regular rate and rhythm, S1/S2 No murmur  Respiratory: clear to auscultation bilaterally   Abdomen: Soft, +Bowel sounds, non tender, non distended, no guarding  MSK: No LE edema, cyanosis or clubbing  Data Reviewed: Basic Metabolic Panel:  Recent Labs Lab 02/28/15 1639 02/28/15 2137 02/28/15 2319 03/01/15 0430 03/01/15 1005 03/02/15 0511  NA 136 138 138 141 140 141  K >7.5* 7.2* 6.9* 7.0* 5.1 5.9*  CL 104 108 105 108 103 106  CO2 21 19 18* 24  --  27  GLUCOSE 122* 142* 143* 145* 115* 76  BUN 111* 113* 110* 107* 61* 68*  CREATININE 9.78* 9.94* 9.97* 10.26* 6.50* 7.77*  CALCIUM 9.1 9.5 9.4 9.8  --  9.0   Liver Function Tests:  Recent Labs Lab 02/23/15 1057 02/28/15 1211 02/28/15 1639 03/01/15 0430  AST 12 14 17 16   ALT 6 10 13 11   ALKPHOS 102 96 86 72  BILITOT 0.44 0.57 0.7 0.5  PROT 5.9* 5.9* 5.9* 4.7*  ALBUMIN 3.0* 2.9* 3.2* 2.4*   No results for input(s): LIPASE, AMYLASE in the last 168 hours. No results for input(s):  AMMONIA in the last 168 hours. CBC:  Recent Labs Lab 02/23/15 1056 02/28/15 1210 02/28/15 1454 03/01/15 0430 03/01/15 1005  WBC 6.6 3.3* 3.3* 3.8*  --   NEUTROABS 5.0 3.0 2.9  --   --   HGB 11.6* 11.9* 11.6* 9.4* 10.5*  HCT 37.9* 38.2* 36.5* 28.9* 31.0*  MCV 79.9 77.3* 77.5* 76.5*  --   PLT 190 60* 47* 39*  --    Cardiac Enzymes: No results for input(s): CKTOTAL, CKMB, CKMBINDEX, TROPONINI in the last 168 hours. BNP (last 3 results) No results for input(s): BNP in the last 8760 hours.  ProBNP (last 3 results) No results for input(s): PROBNP in the last 8760 hours.  CBG:  Recent Labs Lab 03/01/15 0755 03/01/15 1206 03/01/15 1723 03/01/15 2103 03/02/15 0745  GLUCAP 125* 110* 71 96 76    No results found for this or any previous visit  (from the past 240 hour(s)).   Studies:  Recent x-ray studies have been reviewed in detail by the Attending Physician  Scheduled Meds:  Scheduled Meds: . alteplase  2 mg Intracatheter Once  . antiseptic oral rinse  7 mL Mouth Rinse q12n4p  . chlorhexidine  15 mL Mouth Rinse BID  . dorzolamide-timolol  1 drop Left Eye BID  . insulin aspart  0-9 Units Subcutaneous TID WC  . loteprednol  1 drop Left Eye QODAY  . pantoprazole  40 mg Oral Daily  . polyethylene glycol  17 g Oral Daily  . sodium chloride  3 mL Intravenous Q12H  . vitamin B-12  1,000 mcg Oral Q breakfast   Continuous Infusions: . sodium chloride 1,000 mL (03/02/15 0946)    Time spent on care of this patient: 35 min   [place], MD 03/02/2015, 10:35 AM  LOS: 2 days   [facility] Office  [phone] Pager - Text Page per [email or web address]  If 7PM-7AM, please contact night-coverage [email or web address]

## 2015-03-03 DIAGNOSIS — Z992 Dependence on renal dialysis: Secondary | ICD-10-CM

## 2015-03-03 DIAGNOSIS — C800 Disseminated malignant neoplasm, unspecified: Secondary | ICD-10-CM

## 2015-03-03 LAB — RENAL FUNCTION PANEL
Albumin: 2.3 g/dL — ABNORMAL LOW (ref 3.5–5.2)
Anion gap: 6 (ref 5–15)
BUN: 28 mg/dL — AB (ref 6–23)
CALCIUM: 8.3 mg/dL — AB (ref 8.4–10.5)
CO2: 27 mmol/L (ref 19–32)
Chloride: 106 mmol/L (ref 96–112)
Creatinine, Ser: 4.61 mg/dL — ABNORMAL HIGH (ref 0.50–1.35)
GFR calc non Af Amer: 11 mL/min — ABNORMAL LOW (ref >90)
GFR, EST AFRICAN AMERICAN: 13 mL/min — AB (ref >90)
GLUCOSE: 79 mg/dL (ref 70–99)
PHOSPHORUS: 4 mg/dL (ref 2.3–4.6)
Potassium: 4.2 mmol/L (ref 3.5–5.1)
Sodium: 139 mmol/L (ref 135–145)

## 2015-03-03 LAB — BASIC METABOLIC PANEL
ANION GAP: 7 (ref 5–15)
BUN: 28 mg/dL — AB (ref 6–23)
CHLORIDE: 106 mmol/L (ref 96–112)
CO2: 28 mmol/L (ref 19–32)
Calcium: 8.5 mg/dL (ref 8.4–10.5)
Creatinine, Ser: 4.64 mg/dL — ABNORMAL HIGH (ref 0.50–1.35)
GFR calc non Af Amer: 11 mL/min — ABNORMAL LOW (ref >90)
GFR, EST AFRICAN AMERICAN: 13 mL/min — AB (ref >90)
Glucose, Bld: 82 mg/dL (ref 70–99)
Potassium: 4.3 mmol/L (ref 3.5–5.1)
Sodium: 141 mmol/L (ref 135–145)

## 2015-03-03 LAB — CBC
HEMATOCRIT: 28.4 % — AB (ref 39.0–52.0)
HEMOGLOBIN: 8.9 g/dL — AB (ref 13.0–17.0)
MCH: 24.5 pg — AB (ref 26.0–34.0)
MCHC: 31.3 g/dL (ref 30.0–36.0)
MCV: 78.2 fL (ref 78.0–100.0)
PLATELETS: 44 10*3/uL — AB (ref 150–400)
RBC: 3.63 MIL/uL — AB (ref 4.22–5.81)
RDW: 18.6 % — AB (ref 11.5–15.5)
WBC: 4.1 10*3/uL (ref 4.0–10.5)

## 2015-03-03 LAB — GLUCOSE, CAPILLARY
GLUCOSE-CAPILLARY: 76 mg/dL (ref 70–99)
Glucose-Capillary: 91 mg/dL (ref 70–99)
Glucose-Capillary: 93 mg/dL (ref 70–99)
Glucose-Capillary: 102 mg/dL — ABNORMAL HIGH (ref 70–99)

## 2015-03-03 NOTE — Progress Notes (Signed)
Patient back from dialysis at this time, denies pain or problems. Will monitor.

## 2015-03-03 NOTE — Progress Notes (Signed)
[facility] Progress Note   [name] EYC:[id number] DOB: [birth year] DOA: 02/28/2015 PCP: Jerlyn Ly, MD  Brief narrative: [name] is a 75 y.o. 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 Benay Spice), COPD, hyponatremia, diabetes mellitus presented to the [place] [facility] for an appointment secondary to one-week history of nausea and anorexia. Routine blood work was performed and revealed that the patient had AKI with serum creatinine of 9.2 and potassium >7.5 for which he was admitted to the hospital.    Subjective: Generalized weakness improving. No new complaints.   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   DM - cont sliding scale - last A1c 01/06/15 was 6.4    Colon adenocarcinoma - with GOO s/p stent 1/16 - evaluated by Dr Learta Codding in hospital    Thrombocytopenia -Baseline platelets are greater than 150 - Dr Benay Spice recommended to transfuse Plt if count < 10,000 and hold ASA until recovery seen    Code Status: full code Family Communication:  Disposition Plan: to be determined DVT prophylaxis: SCDs Consultants:Neprhology, oncology Procedures: HD cath  Antibiotics: Anti-infectives    None      Objective: Filed Weights   02/28/15 2231 03/03/15 0309 03/03/15 0618  Weight: 65.5 kg (144 lb 6.4 oz) 63 kg (138 lb 14.2 oz) 62.5 kg (137 lb 12.6 oz)    Intake/Output Summary (Last 24 hours) at 03/03/15 1054 Last data filed at 03/03/15 0803  Gross per 24 hour  Intake   1125 ml  Output   1475 ml  Net   -350 ml     Vitals Filed Vitals:   03/03/15 0530 03/03/15 0600 03/03/15 0618  03/03/15 0725  BP: 137/63 119/66 158/71 127/86  Pulse: 78 73 70 70  Temp:   97.8 F (36.6 C) 97.8 F (36.6 C)  TempSrc:   Oral Oral  Resp:  16 13 16   Height:      Weight:   62.5 kg (137 lb 12.6 oz)   SpO2:  98% 99% 98%    Exam:  General:  Pt is alert, not in acute distress  HEENT: No icterus, No thrush  Cardiovascular: regular rate and rhythm, S1/S2 No murmur  Respiratory: clear to auscultation bilaterally   Abdomen: Soft, +Bowel sounds, non tender, non distended, no guarding  MSK: No LE edema, cyanosis or clubbing  Data Reviewed: Basic Metabolic Panel:  Recent Labs Lab 02/28/15 1639 02/28/15 2137 02/28/15 2319 03/01/15 0430 03/01/15 1005 03/02/15 0511  NA 136 138 138 141 140 141  K >7.5* 7.2* 6.9* 7.0* 5.1 5.9*  CL 104 108 105 108 103 106  CO2 21 19 18* 24  --  27  GLUCOSE 122* 142* 143* 145* 115* 76  BUN 111* 113* 110* 107* 61* 68*  CREATININE 9.78* 9.94* 9.97* 10.26* 6.50* 7.77*  CALCIUM 9.1 9.5 9.4 9.8  --  9.0   Liver Function Tests:  Recent Labs Lab 02/28/15 1211 02/28/15 1639 03/01/15 0430  AST 14 17 16   ALT 10 13 11   ALKPHOS 96 86 72  BILITOT 0.57 0.7 0.5  PROT 5.9* 5.9* 4.7*  ALBUMIN 2.9* 3.2* 2.4*   No results for input(s): LIPASE, AMYLASE in the last 168 hours. No results for input(s): AMMONIA in the last 168 hours. CBC:  Recent Labs Lab 02/28/15 1210 02/28/15 1454 03/01/15 0430 03/01/15 1005 03/02/15 1230  WBC 3.3* 3.3* 3.8*  --  5.4  NEUTROABS 3.0 2.9  --   --   --   HGB 11.9* 11.6* 9.4* 10.5* 9.3*  HCT 38.2* 36.5* 28.9* 31.0* 29.4*  MCV 77.3* 77.5* 76.5*  --  78.4  PLT 60* 47* 39*  --  44*   Cardiac Enzymes: No results for input(s): CKTOTAL, CKMB, CKMBINDEX, TROPONINI in the last 168 hours. BNP (last 3 results) No results for input(s): BNP in the last 8760 hours.  ProBNP (last 3 results) No results for input(s): PROBNP in the last 8760 hours.  CBG:  Recent Labs Lab 03/02/15 0745 03/02/15 1227 03/02/15 1702  03/02/15 2220 03/03/15 0802  GLUCAP 76 86 97 88 76    No results found for this or any previous visit (from the past 240 hour(s)).   Studies:  Recent x-ray studies have been reviewed in detail by the Attending Physician  Scheduled Meds:  Scheduled Meds: . alteplase  2 mg Intracatheter Once  . antiseptic oral rinse  7 mL Mouth Rinse q12n4p  . chlorhexidine  15 mL Mouth Rinse BID  . dorzolamide-timolol  1 drop Left Eye BID  . insulin aspart  0-9 Units Subcutaneous TID WC  . loteprednol  1 drop Left Eye QODAY  . pantoprazole  40 mg Oral Daily  . polyethylene glycol  17 g Oral Daily  . sodium chloride  3 mL Intravenous Q12H  . vitamin B-12  1,000 mcg Oral Q breakfast   Continuous Infusions:    Time spent on care of this patient: 35 min   [place], MD 03/03/2015, 10:54 AM  LOS: 3 days   [facility] Office  [phone] Pager - Text Page per [email or web address]  If 7PM-7AM, please contact night-coverage [email or web address]

## 2015-03-03 NOTE — Progress Notes (Signed)
Called dialysis per patient request to see if he is going to get dialysis. Was told that "the other nurse is on 2nd floor doing dialysis there and wouldn't be able to get to patient until around 0300. Called 2nd floor and spoke with dialysis nurse and was told that it would be around 3 and someone would need to bring patient to her as she will be the only one there. Patient and charge nurse informed of above. Will monitor.

## 2015-03-03 NOTE — Progress Notes (Signed)
Patient ID: [name], male   DOB: 06-03-1941, 74 y.o.   MRN: [id number]   [facility] Progress Note    Assessment/ Plan:   1. AKI: suspected to be secondary to Oxaliplatin nephrotoxicity and volume depletion/ATN- renal ultrasound reviewed and found to be negative for any obstruction/hydronephrosis. Underwent hemodialysis earlier today for correction of hyperkalemia noted on labs yesterday. We'll continue to monitor him closely but not very encouraged with his poor urine output. 2. Hyperkalemia: Corrected with dialysis early this morning-monitor daily labs and continue low potassium diet  3. Hypertension: fair BP control, limited UF at HD to allow for renal perfusion. 4. Metastatic colon cancer: s/p FOLFOX with clinical improvement but AKI-- await renal recovery prior to redose Tx 5. Anemia/thrombocytopenia: transfusion triggers per oncology  Subjective:   Reports to be tired this morning-had dialysis done between 3 AM and 7 AM today    Objective:   BP 127/86 mmHg  Pulse 70  Temp(Src) 97.8 F (36.6 C) (Oral)  Resp 16  Ht 5\' 7"  (1.702 m)  Wt 62.5 kg (137 lb 12.6 oz)  BMI 21.58 kg/m2  SpO2 98%  Intake/Output Summary (Last 24 hours) at 03/03/15 1136 Last data filed at 03/03/15 1103  Gross per 24 hour  Intake   1125 ml  Output   1475 ml  Net   -350 ml   Weight change:   Physical Exam: Gen: Comfortably resting in bed KVQ:[id number] RRR, normal s1 and s2 Resp:CTA bilaterally, no rales/rhonchi LOV:FIEP, flat, NT Ext:No LE edema  Imaging: US Renal  03/01/2015   CLINICAL DATA:  Acute kidney injury  EXAM: RENAL/URINARY TRACT ULTRASOUND COMPLETE  COMPARISON:  None.  FINDINGS: Right Kidney:  Length: 11.7 cm.  No mass or hydronephrosis.  Left Kidney:  Length: 12.2 cm.  No mass or hydronephrosis.  Bladder:  Within normal limits.  IMPRESSION: Negative renal ultrasound.   Electronically Signed   By: Julian Hy M.D.   On: 03/01/2015 17:21    Labs: BMET  Recent  Labs Lab 02/28/15 1211 02/28/15 1639 02/28/15 2137 02/28/15 2319 03/01/15 0430 03/01/15 1005 03/02/15 0511 03/03/15 1015  NA 137 136 138 138 141 140 141 141  K >9.0* >7.5* 7.2* 6.9* 7.0* 5.1 5.9* 4.3  CL  --  104 108 105 108 103 106 106  CO2 19* 21 19 18* 24  --  27 28  GLUCOSE 111 122* 142* 143* 145* 115* 76 82  BUN 101.9* 111* 113* 110* 107* 61* 68* 28*  CREATININE 9.2* 9.78* 9.94* 9.97* 10.26* 6.50* 7.77* 4.64*  CALCIUM 9.7 9.1 9.5 9.4 9.8  --  9.0 8.5   CBC  Recent Labs Lab 02/28/15 1210  02/28/15 1454 03/01/15 0430 03/01/15 1005 03/02/15 1230 03/03/15 1015  WBC 3.3*  --  3.3* 3.8*  --  5.4 4.1  NEUTROABS 3.0  --  2.9  --   --   --   --   HGB 11.9*  < > 11.6* 9.4* 10.5* 9.3* 8.9*  HCT 38.2*  < > 36.5* 28.9* 31.0* 29.4* 28.4*  MCV 77.3*  --  77.5* 76.5*  --  78.4 78.2  PLT 60*  --  47* 39*  --  44* 44*  < > = values in this interval not displayed.  Medications:    . alteplase  2 mg Intracatheter Once  . antiseptic oral rinse  7 mL Mouth Rinse q12n4p  . chlorhexidine  15 mL Mouth Rinse BID  . dorzolamide-timolol  1 drop Left  Eye BID  . insulin aspart  0-9 Units Subcutaneous TID WC  . loteprednol  1 drop Left Eye QODAY  . pantoprazole  40 mg Oral Daily  . polyethylene glycol  17 g Oral Daily  . sodium chloride  3 mL Intravenous Q12H  . vitamin B-12  1,000 mcg Oral Q breakfast   Elmarie Shiley, MD 03/03/2015, 11:36 AM

## 2015-03-03 NOTE — Progress Notes (Signed)
IP PROGRESS NOTE  Subjective:   No nausea or pain. He is making urine. He returned from dialysis this morning.  Objective: Vital signs in last 24 hours: Blood pressure 130/59, pulse 72, temperature 97.4 F (36.3 C), temperature source Oral, resp. rate 16, height 5\' 7"  (1.702 m), weight 137 lb 12.6 oz (62.5 kg), SpO2 99 %.  Intake/Output from previous day: 03/09 0701 - 03/10 0700 In: 1185 [P.O.:60; I.V.:1125] Out: 1075 [Urine:575]  Physical Exam:   Lungs: Clear bilaterally Cardiac: Regular rate and rhythm Abdomen: Soft, no hepatosplenomegaly, slight fullness in the supra umbilical area, tender in the supra umbilical area Extremities: Trace pedal edema bilaterally Neurologic: Alert and oriented  Portacath/PICC-without erythema  Lab Results:  Recent Labs  03/02/15 1230 03/03/15 1015  WBC 5.4 4.1  HGB 9.3* 8.9*  HCT 29.4* 28.4*  PLT 44* 44*    BMET  Recent Labs  03/02/15 0511 03/03/15 1015  NA 141 139  141  K 5.9* 4.2  4.3  CL 106 106  106  CO2 27 27  28   GLUCOSE 76 79  82  BUN 68* 28*  28*  CREATININE 7.77* 4.61*  4.64*  CALCIUM 9.0 8.3*  8.5    Studies/Results: No results found.  Medications: I have reviewed the patient's current medications.  Assessment/Plan:  1. Metastatic colon cancer involving the liver and peritoneal carcinomatosis, currently being treated with FOLFOX, status post cycle 4 on 02/23/2015  2.  Diabetes  3.  Upper abdominal pain secondary to metastatic colon cancer-improved  4.   Gastric outlet obstruction secondary to metastatic colon cancer, status post placement of a gastric/duodenal stents on 01/04/2015 and 01/06/2015  5.   Acute renal failure  6.  Anemia/thrombocytopenia secondary to chemotherapy-stable   [name] appears unchanged. He is undergoing hemodialysis for management of acute renal failure. The platelet count has stabilized now at day 9 following the most recent cycle of  chemotherapy.  Recommendations: 1. Evaluation/management of renal failure per nephrology 2. Please call oncology as needed. I will check on him 03/07/2015 if he remains in the hospital. He is scheduled for outpatient follow-up at the Cancer center 03/09/2015.   LOS: 3 days   Melisssa Donner  03/03/2015, 5:21 PM

## 2015-03-04 DIAGNOSIS — J438 Other emphysema: Secondary | ICD-10-CM

## 2015-03-04 LAB — CBC
HCT: 26.6 % — ABNORMAL LOW (ref 39.0–52.0)
Hemoglobin: 8.4 g/dL — ABNORMAL LOW (ref 13.0–17.0)
MCH: 24.4 pg — ABNORMAL LOW (ref 26.0–34.0)
MCHC: 31.6 g/dL (ref 30.0–36.0)
MCV: 77.3 fL — ABNORMAL LOW (ref 78.0–100.0)
PLATELETS: 55 10*3/uL — AB (ref 150–400)
RBC: 3.44 MIL/uL — AB (ref 4.22–5.81)
RDW: 18.4 % — ABNORMAL HIGH (ref 11.5–15.5)
WBC: 3.6 10*3/uL — AB (ref 4.0–10.5)

## 2015-03-04 LAB — RENAL FUNCTION PANEL
Albumin: 2.2 g/dL — ABNORMAL LOW (ref 3.5–5.2)
Anion gap: 9 (ref 5–15)
BUN: 36 mg/dL — ABNORMAL HIGH (ref 6–23)
CO2: 28 mmol/L (ref 19–32)
CREATININE: 5.84 mg/dL — AB (ref 0.50–1.35)
Calcium: 8.5 mg/dL (ref 8.4–10.5)
Chloride: 103 mmol/L (ref 96–112)
GFR, EST AFRICAN AMERICAN: 10 mL/min — AB (ref >90)
GFR, EST NON AFRICAN AMERICAN: 9 mL/min — AB (ref >90)
Glucose, Bld: 85 mg/dL (ref 70–99)
Phosphorus: 5.4 mg/dL — ABNORMAL HIGH (ref 2.3–4.6)
Potassium: 4.2 mmol/L (ref 3.5–5.1)
Sodium: 140 mmol/L (ref 135–145)

## 2015-03-04 LAB — GLUCOSE, CAPILLARY
Glucose-Capillary: 85 mg/dL (ref 70–99)
Glucose-Capillary: 99 mg/dL (ref 70–99)
Glucose-Capillary: 87 mg/dL (ref 70–99)
Glucose-Capillary: 112 mg/dL — ABNORMAL HIGH (ref 70–99)

## 2015-03-04 NOTE — Progress Notes (Signed)
Patient ID: [name], male   DOB: [birth year], 74 y.o.   MRN: [id number]  [facility] Progress Note    Assessment/ Plan:   1. AKI: suspected to be secondary to Oxaliplatin nephrotoxicity and volume depletion/ATN- renal ultrasound reviewed and found to be negative for any obstruction/hydronephrosis. Underwent hemodialysis yesterday for correction of hyperkalemia. Improving UOP but without improving creatinine yet---anticipate that renal recovery will follow soon.NO HD needs identified at this time 2. Hyperkalemia: Corrected with dialysis -monitor daily labs and continue low potassium diet  3. Hypertension: fair BP control, fluid status acceptable. 4. Metastatic colon cancer: s/p FOLFOX with clinical improvement but AKI-- await renal recovery prior to redose Tx 5. Anemia/thrombocytopenia: transfusion triggers per oncology   Subjective:   Reports to be feeling well- denies any complaints   Objective:   BP 141/69 mmHg  Pulse 79  Temp(Src) 98 F (36.7 C) (Oral)  Resp 12  Ht 5\' 7"  (1.702 m)  Wt 62.5 kg (137 lb 12.6 oz)  BMI 21.58 kg/m2  SpO2 92%  Intake/Output Summary (Last 24 hours) at 03/04/15 1057 Last data filed at 03/04/15 0850  Gross per 24 hour  Intake    960 ml  Output   1050 ml  Net    -90 ml   Weight change:   Physical Exam: TIR:[id number] resting in bed YPP:JKDTO RRR, normal s1 and s2 Resp:CTA bilaterally, no rales IZT:IWPY, flat, NT, BS normal Ext:No LE edema  Imaging: No results found.  Labs: BMET  Recent Labs Lab 02/28/15 1639 02/28/15 2137 02/28/15 2319 03/01/15 0430 03/01/15 1005 03/02/15 0511 03/03/15 1015 03/04/15 0455  NA 136 138 138 141 140 141 139  141 140  K >7.5* 7.2* 6.9* 7.0* 5.1 5.9* 4.2  4.3 4.2  CL 104 108 105 108 103 106 106  106 103  CO2 21 19 18* 24  --  27 27  28 28   GLUCOSE 122* 142* 143* 145* 115* 76 79  82 85  BUN 111* 113* 110* 107* 61* 68* 28*  28* 36*  CREATININE 9.78* 9.94* 9.97* 10.26* 6.50*  7.77* 4.61*  4.64* 5.84*  CALCIUM 9.1 9.5 9.4 9.8  --  9.0 8.3*  8.5 8.5  PHOS  --   --   --   --   --   --  4.0 5.4*   CBC  Recent Labs Lab 02/28/15 1210  02/28/15 1454 03/01/15 0430 03/01/15 1005 03/02/15 1230 03/03/15 1015 03/04/15 0455  WBC 3.3*  < > 3.3* 3.8*  --  5.4 4.1 3.6*  NEUTROABS 3.0  --  2.9  --   --   --   --   --   HGB 11.9*  < > 11.6* 9.4* 10.5* 9.3* 8.9* 8.4*  HCT 38.2*  < > 36.5* 28.9* 31.0* 29.4* 28.4* 26.6*  MCV 77.3*  < > 77.5* 76.5*  --  78.4 78.2 77.3*  PLT 60*  < > 47* 39*  --  44* 44* 55*  < > = values in this interval not displayed.  Medications:    . alteplase  2 mg Intracatheter Once  . antiseptic oral rinse  7 mL Mouth Rinse q12n4p  . chlorhexidine  15 mL Mouth Rinse BID  . dorzolamide-timolol  1 drop Left Eye BID  . insulin aspart  0-9 Units Subcutaneous TID WC  . loteprednol  1 drop Left Eye QODAY  . pantoprazole  40 mg Oral Daily  . polyethylene glycol  17 g Oral Daily  . sodium  chloride  3 mL Intravenous Q12H  . vitamin B-12  1,000 mcg Oral Q breakfast   Elmarie Shiley, MD 03/04/2015, 10:57 AM

## 2015-03-04 NOTE — Care Management Note (Signed)
    Page 1 of 1   03/10/2015     4:23:34 PM CARE MANAGEMENT NOTE 03/10/2015  Patient:  [name], [name]   Account Number:  [id number]  Date Initiated:  03/04/2015  Documentation initiated by:  Lorne Skeens  Subjective/Objective Assessment:   Patient was admitted with acute kidney injury.     Action/Plan:   Will follow for discharge needs.   Anticipated DC Date:  03/10/2015   Anticipated DC Plan:  [place]  CM consult      Choice offered to / List presented to:             Status of service:  Completed, signed off Medicare Important Message given?  YES (If response is "NO", the following Medicare IM given date fields will be blank) Date Medicare IM given:  03/04/2015 Medicare IM given by:  Lorne Skeens Date Additional Medicare IM given:  03/10/2015 Additional Medicare IM given by:  [facility]  Discharge Disposition:  HOME/SELF CARE  Per UR Regulation:  Reviewed for med. necessity/level of care/duration of stay  If discussed at [place] of Stay Meetings, dates discussed:   03/08/2015  03/10/2015    Comments:  03/10/15 [place], BSN [phone] no needs.  03/08/15 [place], BSN [phone] conts to monitor cr, hold diuretics, renal following.  03/04/15 [place] RN, MSN, CM- Medicare IM letter provided.

## 2015-03-04 NOTE — Progress Notes (Signed)
[facility] Progress Note   [name] CHE:[id number] DOB: Apr 01, 1941 DOA: 02/28/2015 PCP: Sean Ly, MD  Brief narrative: [name] is a 74 y.o. 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 Benay [name]), COPD, hyponatremia, diabetes mellitus presented to the [place] [facility] for an appointment secondary to one-week history of nausea and anorexia. Routine blood work was performed and revealed that the patient had AKI with serum creatinine of 9.2 and potassium >7.5 for which he was admitted to the hospital.    Subjective: Generalized weakness much improved. No complaints.   Assessment/Plan: Principal Problem:   AKI (acute kidney injury)/  Hyperkalemia/   Dehydration - underwent emergent dialysis 3/8 and then again at 3 AM on 3/10 as K and Cr rose again - renal team suspected AKI from Oxaliplatin and volume depletion - Given IV fluids as well as he was also suspected to have a prerenal cause - renal team currently cautiously following Cr level to determine if there is further need for dialysis.   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   DM - cont sliding scale - last A1c 01/06/15 was 6.4    Colon adenocarcinoma - with GOO s/p stent 1/16 - evaluated by [name] in hospital    Thrombocytopenia -Baseline platelets are greater than 150 - Sean [name] recommended to transfuse Plt if count < 10,000 and hold ASA until recovery seen    Code Status: full code Family Communication:  Disposition Plan: to be determined DVT prophylaxis: SCDs Consultants:Neprhology, oncology Procedures: HD cath  Antibiotics: Anti-infectives    None      Objective: Filed Weights   02/28/15 2231 03/03/15 0309 03/03/15 0618  Weight: 65.5 kg (144 lb 6.4 oz) 63 kg (138 lb 14.2 oz) 62.5 kg (137 lb 12.6 oz)    Intake/Output Summary (Last 24 hours) at 03/04/15 1346 Last data filed at 03/04/15 0850  Gross per 24 hour  Intake    960 ml   Output   1050 ml  Net    -90 ml     Vitals Filed Vitals:   03/03/15 0725 03/03/15 1406 03/03/15 2247 03/04/15 0558  BP: 127/86 130/59 144/55 141/69  Pulse: 70 72 66 79  Temp: 97.8 F (36.6 C) 97.4 F (36.3 C) 98.2 F (36.8 C) 98 F (36.7 C)  TempSrc: Oral Oral Oral Oral  Resp: 16 16 12 12   Height:      Weight:      SpO2: 98% 99% 95% 92%    Exam:  General:  Pt is alert, not in acute distress  HEENT: No icterus, No thrush  Cardiovascular: regular rate and rhythm, S1/S2 No murmur  Respiratory: clear to auscultation bilaterally   Abdomen: Soft, +Bowel sounds, non tender, non distended, no guarding  MSK: No LE edema, cyanosis or clubbing  Data Reviewed: Basic Metabolic Panel:  Recent Labs Lab 02/28/15 2319 03/01/15 0430 03/01/15 1005 03/02/15 0511 03/03/15 1015 03/04/15 0455  NA 138 141 140 141 139  141 140  K 6.9* 7.0* 5.1 5.9* 4.2  4.3 4.2  CL 105 108 103 106 106  106 103  CO2 18* 24  --  27 27  28 28   GLUCOSE 143* 145* 115* 76 79  82 85  BUN 110* 107* 61* 68* 28*  28* 36*  CREATININE 9.97* 10.26* 6.50* 7.77* 4.61*  4.64* 5.84*  CALCIUM 9.4 9.8  --  9.0 8.3*  8.5 8.5  PHOS  --   --   --   --  4.0 5.4*   Liver Function Tests:  Recent Labs Lab 02/28/15 1211 02/28/15 1639 03/01/15 0430 03/03/15 1015 03/04/15 0455  AST 14 17 16   --   --   ALT 10 13 11   --   --   ALKPHOS 96 86 72  --   --   BILITOT 0.57 0.7 0.5  --   --   PROT 5.9* 5.9* 4.7*  --   --   ALBUMIN 2.9* 3.2* 2.4* 2.3* 2.2*   No results for input(s): LIPASE, AMYLASE in the last 168 hours. No results for input(s): AMMONIA in the last 168 hours. CBC:  Recent Labs Lab 02/28/15 1210  02/28/15 1454 03/01/15 0430 03/01/15 1005 03/02/15 1230 03/03/15 1015 03/04/15 0455  WBC 3.3*  --  3.3* 3.8*  --  5.4 4.1 3.6*  NEUTROABS 3.0  --  2.9  --   --   --   --   --   HGB 11.9*  < > 11.6* 9.4* 10.5* 9.3* 8.9* 8.4*  HCT 38.2*  < > 36.5* 28.9* 31.0* 29.4* 28.4* 26.6*  MCV 77.3*  --   77.5* 76.5*  --  78.4 78.2 77.3*  PLT 60*  --  47* 39*  --  44* 44* 55*  < > = values in this interval not displayed. Cardiac Enzymes: No results for input(s): CKTOTAL, CKMB, CKMBINDEX, TROPONINI in the last 168 hours. BNP (last 3 results) No results for input(s): BNP in the last 8760 hours.  ProBNP (last 3 results) No results for input(s): PROBNP in the last 8760 hours.  CBG:  Recent Labs Lab 03/03/15 1222 03/03/15 1657 03/03/15 2242 03/04/15 0746 03/04/15 1149  GLUCAP 91 93 102* 85 99    No results found for this or any previous visit (from the past 240 hour(s)).   Studies:  Recent x-ray studies have been reviewed in detail by the Attending Physician  Scheduled Meds:  Scheduled Meds: . alteplase  2 mg Intracatheter Once  . antiseptic oral rinse  7 mL Mouth Rinse q12n4p  . chlorhexidine  15 mL Mouth Rinse BID  . dorzolamide-timolol  1 drop Left Eye BID  . insulin aspart  0-9 Units Subcutaneous TID WC  . loteprednol  1 drop Left Eye QODAY  . pantoprazole  40 mg Oral Daily  . polyethylene glycol  17 g Oral Daily  . sodium chloride  3 mL Intravenous Q12H  . vitamin B-12  1,000 mcg Oral Q breakfast   Continuous Infusions:    Time spent on care of this patient: 35 min   [place], MD 03/04/2015, 1:46 PM  LOS: 4 days   [facility] Office  [phone] Pager - Text Page per [email or web address]  If 7PM-7AM, please contact night-coverage [email or web address]

## 2015-03-04 NOTE — Progress Notes (Signed)
Pt ambulated hall w/ nurse assist and rolling walker, pt went one lap around nurse station and to the end of the hall and back to hs room, pt sitting in chair now, tolerated well

## 2015-03-05 LAB — CBC
HCT: 28.4 % — ABNORMAL LOW (ref 39.0–52.0)
HEMOGLOBIN: 9.2 g/dL — AB (ref 13.0–17.0)
MCH: 24.8 pg — AB (ref 26.0–34.0)
MCHC: 32.4 g/dL (ref 30.0–36.0)
MCV: 76.5 fL — ABNORMAL LOW (ref 78.0–100.0)
Platelets: 67 10*3/uL — ABNORMAL LOW (ref 150–400)
RBC: 3.71 MIL/uL — ABNORMAL LOW (ref 4.22–5.81)
RDW: 18.1 % — ABNORMAL HIGH (ref 11.5–15.5)
WBC: 4.1 10*3/uL (ref 4.0–10.5)

## 2015-03-05 LAB — RENAL FUNCTION PANEL
ANION GAP: 8 (ref 5–15)
Albumin: 2.4 g/dL — ABNORMAL LOW (ref 3.5–5.2)
BUN: 46 mg/dL — ABNORMAL HIGH (ref 6–23)
CHLORIDE: 102 mmol/L (ref 96–112)
CO2: 27 mmol/L (ref 19–32)
CREATININE: 6.65 mg/dL — AB (ref 0.50–1.35)
Calcium: 8.7 mg/dL (ref 8.4–10.5)
GFR calc Af Amer: 8 mL/min — ABNORMAL LOW (ref >90)
GFR, EST NON AFRICAN AMERICAN: 7 mL/min — AB (ref >90)
Glucose, Bld: 93 mg/dL (ref 70–99)
Phosphorus: 5.6 mg/dL — ABNORMAL HIGH (ref 2.3–4.6)
Potassium: 3.9 mmol/L (ref 3.5–5.1)
Sodium: 137 mmol/L (ref 135–145)

## 2015-03-05 LAB — GLUCOSE, CAPILLARY: Glucose-Capillary: 76 mg/dL (ref 70–99)

## 2015-03-05 NOTE — Progress Notes (Signed)
[facility] Progress Note   [name] TZG:[id number] DOB: [birth year] DOA: 02/28/2015 PCP: Jerlyn Ly, MD  Brief narrative: [name] is a 74 y.o. 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 Benay Spice), COPD, hyponatremia, diabetes mellitus presented to the [place] at [facility] for an appointment secondary to one-week history of nausea and anorexia. Routine blood work was performed and revealed that the patient had AKI with serum creatinine of 9.2 and potassium >7.5 for which he was admitted to the hospital.    Subjective: Generalized weakness much improved. No complaints.   Assessment/Plan: Principal Problem:   AKI (acute kidney injury)/  Hyperkalemia/   Dehydration - underwent emergent dialysis 3/8 and then again at 3 AM on 3/10 as K and Cr rose again - renal team suspected AKI from Oxaliplatin and volume depletion - Given IV fluids as well as he was also suspected to have a prerenal cause - renal team currently cautiously following Cr level to determine if there is further need for dialysis. No need to dialyze today per nephrology. If Cr improves tomorrow, can d/c home.   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   DM - cont sliding scale - last A1c 01/06/15 was 6.4    Colon adenocarcinoma - with GOO s/p stent 1/16 - evaluated by Dr Learta Codding in hospital    Thrombocytopenia -Baseline platelets are greater than 150 - Dr Benay Spice recommended to transfuse Plt if count < 10,000 and hold ASA until recovery seen- no h/o CVA or MI    Code Status: full code Family Communication:  Disposition Plan: to be determined DVT prophylaxis: SCDs Consultants:Neprhology, oncology Procedures: HD cath  Antibiotics: Anti-infectives    None      Objective: Filed Weights   02/28/15 2231 03/03/15 0309 03/03/15 0618  Weight: 65.5 kg (144 lb 6.4 oz) 63 kg (138 lb 14.2 oz) 62.5 kg (137 lb 12.6 oz)    Intake/Output Summary (Last 24  hours) at 03/05/15 1031 Last data filed at 03/05/15 0418  Gross per 24 hour  Intake    360 ml  Output   1200 ml  Net   -840 ml     Vitals Filed Vitals:   03/03/15 2247 03/04/15 0558 03/04/15 2206 03/05/15 0533  BP: 144/55 141/69 136/66 136/63  Pulse: 66 79 76 70  Temp: 98.2 F (36.8 C) 98 F (36.7 C) 97.7 F (36.5 C) 98.1 F (36.7 C)  TempSrc: Oral Oral Oral Oral  Resp: 12 12 18    Height:      Weight:      SpO2: 95% 92% 99% 93%    Exam:  General:  Pt is alert, not in acute distress  HEENT: No icterus, No thrush  Cardiovascular: regular rate and rhythm, S1/S2 No murmur  Respiratory: clear to auscultation bilaterally   Abdomen: Soft, +Bowel sounds, non tender, non distended, no guarding  MSK: No LE edema, cyanosis or clubbing  Data Reviewed: Basic Metabolic Panel:  Recent Labs Lab 03/01/15 0430 03/01/15 1005 03/02/15 0511 03/03/15 1015 03/04/15 0455 03/05/15 0510  NA 141 140 141 139  141 140 137  K 7.0* 5.1 5.9* 4.2  4.3 4.2 3.9  CL 108 103 106 106  106 103 102  CO2 24  --  27 27  28 28 27   GLUCOSE 145* 115* 76 79  82 85 93  BUN 107* 61* 68* 28*  28* 36* 46*  CREATININE 10.26* 6.50* 7.77* 4.61*  4.64*  5.84* 6.65*  CALCIUM 9.8  --  9.0 8.3*  8.5 8.5 8.7  PHOS  --   --   --  4.0 5.4* 5.6*   Liver Function Tests:  Recent Labs Lab 02/28/15 1211 02/28/15 1639 03/01/15 0430 03/03/15 1015 03/04/15 0455 03/05/15 0510  AST 14 17 16   --   --   --   ALT 10 13 11   --   --   --   ALKPHOS 96 86 72  --   --   --   BILITOT 0.57 0.7 0.5  --   --   --   PROT 5.9* 5.9* 4.7*  --   --   --   ALBUMIN 2.9* 3.2* 2.4* 2.3* 2.2* 2.4*   No results for input(s): LIPASE, AMYLASE in the last 168 hours. No results for input(s): AMMONIA in the last 168 hours. CBC:  Recent Labs Lab 02/28/15 1210  02/28/15 1454 03/01/15 0430 03/01/15 1005 03/02/15 1230 03/03/15 1015 03/04/15 0455 03/05/15 0510  WBC 3.3*  < > 3.3* 3.8*  --  5.4 4.1 3.6* 4.1  NEUTROABS  3.0  --  2.9  --   --   --   --   --   --   HGB 11.9*  < > 11.6* 9.4* 10.5* 9.3* 8.9* 8.4* 9.2*  HCT 38.2*  < > 36.5* 28.9* 31.0* 29.4* 28.4* 26.6* 28.4*  MCV 77.3*  < > 77.5* 76.5*  --  78.4 78.2 77.3* 76.5*  PLT 60*  < > 47* 39*  --  44* 44* 55* 67*  < > = values in this interval not displayed. Cardiac Enzymes: No results for input(s): CKTOTAL, CKMB, CKMBINDEX, TROPONINI in the last 168 hours. BNP (last 3 results) No results for input(s): BNP in the last 8760 hours.  ProBNP (last 3 results) No results for input(s): PROBNP in the last 8760 hours.  CBG:  Recent Labs Lab 03/04/15 0746 03/04/15 1149 03/04/15 1701 03/04/15 2205 03/05/15 0821  GLUCAP 85 99 87 112* 76    No results found for this or any previous visit (from the past 240 hour(s)).   Studies:  Recent x-ray studies have been reviewed in detail by the Attending Physician  Scheduled Meds:  Scheduled Meds: . alteplase  2 mg Intracatheter Once  . antiseptic oral rinse  7 mL Mouth Rinse q12n4p  . chlorhexidine  15 mL Mouth Rinse BID  . dorzolamide-timolol  1 drop Left Eye BID  . loteprednol  1 drop Left Eye QODAY  . pantoprazole  40 mg Oral Daily  . polyethylene glycol  17 g Oral Daily  . sodium chloride  3 mL Intravenous Q12H  . vitamin B-12  1,000 mcg Oral Q breakfast   Continuous Infusions:    Time spent on care of this patient: 35 min   [place], MD 03/05/2015, 10:31 AM  LOS: 5 days   [facility] Office  [phone] Pager - Text Page per [email or web address]  If 7PM-7AM, please contact night-coverage [email or web address]

## 2015-03-05 NOTE — Progress Notes (Signed)
Patient ID: [name], male   DOB: 16-Mar-1941, 74 y.o.   MRN: [id number]  [facility] Progress Note    Assessment/ Plan:   1. AKI: suspected to be secondary to Oxaliplatin nephrotoxicity and volume depletion/ATN. Improving urine output but with a lag in renal recovery-delta creatinine noted to be smaller and likely indicating an approach to the plateau phase versus recovery over the next few days. No indications for dialysis noted at this time. Discussed with patient. 2. Hypertension: fair BP control, fluid status acceptable. 3. Metastatic colon cancer: s/p FOLFOX with clinical improvement but AKI-- await renal recovery prior to redose Tx 4. Anemia/thrombocytopenia: transfusion triggers per oncology  Subjective:   Reports to be feeling well-denies any chest pain or shortness of breath and feels that his strength is improving    Objective:   BP 136/63 mmHg  Pulse 70  Temp(Src) 98.1 F (36.7 C) (Oral)  Resp 18  Ht 5\' 7"  (1.702 m)  Wt 62.5 kg (137 lb 12.6 oz)  BMI 21.58 kg/m2  SpO2 93%  Intake/Output Summary (Last 24 hours) at 03/05/15 0911 Last data filed at 03/05/15 0418  Gross per 24 hour  Intake    360 ml  Output   1200 ml  Net   -840 ml   Weight change:   Physical Exam: Gen: Comfortably sitting on the edge of his bed CVS: Pulse regular in rate and rhythm Resp: Clear to auscultation, no rales Abd: Soft, nontender, bowel sounds normal Ext: No lower extremity edema  Imaging: No results found.  Labs: BMET  Recent Labs Lab 02/28/15 2137 02/28/15 2319 03/01/15 0430 03/01/15 1005 03/02/15 0511 03/03/15 1015 03/04/15 0455 03/05/15 0510  NA 138 138 141 140 141 139  141 140 137  K 7.2* 6.9* 7.0* 5.1 5.9* 4.2  4.3 4.2 3.9  CL 108 105 108 103 106 106  106 103 102  CO2 19 18* 24  --  27 27  28 28 27   GLUCOSE 142* 143* 145* 115* 76 79  82 85 93  BUN 113* 110* 107* 61* 68* 28*  28* 36* 46*  CREATININE 9.94* 9.97* 10.26* 6.50* 7.77* 4.61*   4.64* 5.84* 6.65*  CALCIUM 9.5 9.4 9.8  --  9.0 8.3*  8.5 8.5 8.7  PHOS  --   --   --   --   --  4.0 5.4* 5.6*   CBC  Recent Labs Lab 02/28/15 1210 02/28/15 1454  03/02/15 1230 03/03/15 1015 03/04/15 0455 03/05/15 0510  WBC 3.3* 3.3*  < > 5.4 4.1 3.6* 4.1  NEUTROABS 3.0 2.9  --   --   --   --   --   HGB 11.9* 11.6*  < > 9.3* 8.9* 8.4* 9.2*  HCT 38.2* 36.5*  < > 29.4* 28.4* 26.6* 28.4*  MCV 77.3* 77.5*  < > 78.4 78.2 77.3* 76.5*  PLT 60* 47*  < > 44* 44* 55* 67*  < > = values in this interval not displayed.  Medications:    . alteplase  2 mg Intracatheter Once  . antiseptic oral rinse  7 mL Mouth Rinse q12n4p  . chlorhexidine  15 mL Mouth Rinse BID  . dorzolamide-timolol  1 drop Left Eye BID  . loteprednol  1 drop Left Eye QODAY  . pantoprazole  40 mg Oral Daily  . polyethylene glycol  17 g Oral Daily  . sodium chloride  3 mL Intravenous Q12H  . vitamin B-12  1,000 mcg Oral Q breakfast  Elmarie Shiley, MD 03/05/2015, 9:11 AM

## 2015-03-05 NOTE — Progress Notes (Signed)
Pt up and ambulating on his own, pt tolerationg well

## 2015-03-06 LAB — CBC
HEMATOCRIT: 28.6 % — AB (ref 39.0–52.0)
Hemoglobin: 9.2 g/dL — ABNORMAL LOW (ref 13.0–17.0)
MCH: 24.9 pg — ABNORMAL LOW (ref 26.0–34.0)
MCHC: 32.2 g/dL (ref 30.0–36.0)
MCV: 77.5 fL — AB (ref 78.0–100.0)
PLATELETS: 78 10*3/uL — AB (ref 150–400)
RBC: 3.69 MIL/uL — ABNORMAL LOW (ref 4.22–5.81)
RDW: 18.1 % — ABNORMAL HIGH (ref 11.5–15.5)
WBC: 4.8 10*3/uL (ref 4.0–10.5)

## 2015-03-06 LAB — RENAL FUNCTION PANEL
ALBUMIN: 2.4 g/dL — AB (ref 3.5–5.2)
Anion gap: 9 (ref 5–15)
BUN: 52 mg/dL — AB (ref 6–23)
CHLORIDE: 100 mmol/L (ref 96–112)
CO2: 27 mmol/L (ref 19–32)
Calcium: 8.8 mg/dL (ref 8.4–10.5)
Creatinine, Ser: 6.98 mg/dL — ABNORMAL HIGH (ref 0.50–1.35)
GFR calc Af Amer: 8 mL/min — ABNORMAL LOW (ref >90)
GFR, EST NON AFRICAN AMERICAN: 7 mL/min — AB (ref >90)
Glucose, Bld: 93 mg/dL (ref 70–99)
Phosphorus: 5.6 mg/dL — ABNORMAL HIGH (ref 2.3–4.6)
Potassium: 3.7 mmol/L (ref 3.5–5.1)
SODIUM: 136 mmol/L (ref 135–145)

## 2015-03-06 NOTE — Progress Notes (Signed)
[facility] Progress Note   REINHARDT LICAUSI GNF:[id number] DOB: [birth year] DOA: 02/28/2015 PCP: Jerlyn Ly, MD  Brief narrative: [name] is a 74 y.o. 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 Benay Spice), COPD, hyponatremia, diabetes mellitus presented to the [place] at [facility] for an appointment secondary to one-week history of nausea and anorexia. Routine blood work was performed and revealed that the patient had AKI with serum creatinine of 9.2 and potassium >7.5 for which he was admitted to the hospital.    Subjective: Continues to feel well.   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   DM - cont sliding scale - last A1c 01/06/15 was 6.4    Colon adenocarcinoma - with GOO s/p stent 1/16 - evaluated by Dr Learta Codding in hospital    Thrombocytopenia -Baseline platelets are greater than 150 - Dr Benay Spice recommended to transfuse Plt if count < 10,000 and hold ASA until recovery seen- no h/o CVA or MI    Code Status: full code Family Communication:  Disposition Plan: to be determined DVT prophylaxis: SCDs Consultants:Neprhology, oncology Procedures: HD cath  Antibiotics: Anti-infectives    None      Objective: Filed Weights   02/28/15 2231 03/03/15 0309 03/03/15 0618  Weight: 65.5 kg (144 lb 6.4 oz) 63 kg (138 lb 14.2 oz) 62.5 kg (137 lb 12.6 oz)    Intake/Output Summary (Last 24 hours) at 03/06/15  1055 Last data filed at 03/06/15 0500  Gross per 24 hour  Intake    240 ml  Output   1550 ml  Net  -1310 ml     Vitals Filed Vitals:   03/05/15 0533 03/05/15 1319 03/05/15 2139 03/06/15 0548  BP: 136/63 137/64 131/53 125/62  Pulse: 70 72 67 70  Temp: 98.1 F (36.7 C) 98.3 F (36.8 C) 98.4 F (36.9 C) 97.9 F (36.6 C)  TempSrc: Oral Oral Oral Oral  Resp:  18 18 18   Height:      Weight:      SpO2: 93% 94% 91% 94%    Exam:  General:  Pt is alert, not in acute distress  HEENT: No icterus, No thrush  Cardiovascular: regular rate and rhythm, S1/S2 No murmur  Respiratory: clear to auscultation bilaterally   Abdomen: Soft, +Bowel sounds, non tender, non distended, no guarding  MSK: No LE edema, cyanosis or clubbing  Data Reviewed: Basic Metabolic Panel:  Recent Labs Lab 03/02/15 0511 03/03/15 1015 03/04/15 0455 03/05/15 0510 03/06/15 0500  NA 141 139  141 140 137 136  K 5.9* 4.2  4.3 4.2 3.9 3.7  CL 106 106  106 103 102 100  CO2 27 27  28 28 27 27   GLUCOSE 76 79  82 85 93 93  BUN 68* 28*  28* 36* 46* 52*  CREATININE 7.77* 4.61*  4.64* 5.84* 6.65* 6.98*  CALCIUM 9.0 8.3*  8.5 8.5 8.7 8.8  PHOS  --  4.0 5.4* 5.6* 5.6*   Liver Function Tests:  Recent Labs Lab 02/28/15 1211  02/28/15 1639 03/01/15 0430 03/03/15 1015 03/04/15 0455 03/05/15 0510 03/06/15 0500  AST 14  --  17 16  --   --   --   --   ALT 10  --  13 11  --   --   --   --   ALKPHOS 96  --  86 72  --   --   --   --   BILITOT 0.57  --  0.7 0.5  --   --   --   --   PROT 5.9*  --  5.9* 4.7*  --   --   --   --   ALBUMIN 2.9*  < > 3.2* 2.4* 2.3* 2.2* 2.4* 2.4*  < > = values in this interval not displayed. No results for input(s): LIPASE, AMYLASE in the last 168 hours. No results for input(s): AMMONIA in the last 168 hours. CBC:  Recent Labs Lab 02/28/15 1210 02/28/15 1454  03/02/15 1230 03/03/15 1015 03/04/15 0455 03/05/15 0510 03/06/15 0500  WBC 3.3* 3.3*  < > 5.4 4.1 3.6*  4.1 4.8  NEUTROABS 3.0 2.9  --   --   --   --   --   --   HGB 11.9* 11.6*  < > 9.3* 8.9* 8.4* 9.2* 9.2*  HCT 38.2* 36.5*  < > 29.4* 28.4* 26.6* 28.4* 28.6*  MCV 77.3* 77.5*  < > 78.4 78.2 77.3* 76.5* 77.5*  PLT 60* 47*  < > 44* 44* 55* 67* 78*  < > = values in this interval not displayed. Cardiac Enzymes: No results for input(s): CKTOTAL, CKMB, CKMBINDEX, TROPONINI in the last 168 hours. BNP (last 3 results) No results for input(s): BNP in the last 8760 hours.  ProBNP (last 3 results) No results for input(s): PROBNP in the last 8760 hours.  CBG:  Recent Labs Lab 03/04/15 0746 03/04/15 1149 03/04/15 1701 03/04/15 2205 03/05/15 0821  GLUCAP 85 99 87 112* 76    No results found for this or any previous visit (from the past 240 hour(s)).   Studies:  Recent x-ray studies have been reviewed in detail by the Attending Physician  Scheduled Meds:  Scheduled Meds: . alteplase  2 mg Intracatheter Once  . antiseptic oral rinse  7 mL Mouth Rinse q12n4p  . chlorhexidine  15 mL Mouth Rinse BID  . dorzolamide-timolol  1 drop Left Eye BID  . loteprednol  1 drop Left Eye QODAY  . pantoprazole  40 mg Oral Daily  . polyethylene glycol  17 g Oral Daily  . sodium chloride  3 mL Intravenous Q12H  . vitamin B-12  1,000 mcg Oral Q breakfast   Continuous Infusions:    Time spent on care of this patient: 35 min   [place], MD 03/06/2015, 10:55 AM  LOS: 6 days   [facility] Office  [phone] Pager - Text Page per [email or web address]  If 7PM-7AM, please contact night-coverage [email or web address]

## 2015-03-06 NOTE — Progress Notes (Signed)
Sent text to Dr. Vallarie Mare about pt's BUN of 52 and creatine of 6.98

## 2015-03-06 NOTE — Progress Notes (Signed)
Patient ID: [name], male   DOB: [birth year], 74 y.o.   MRN: [id number]  [facility] Progress Note   Assessment/ Plan:   1. AKI: suspected to be secondary to Oxaliplatin nephrotoxicity and volume depletion/ATN. Improving urine output but with a lag in renal recovery-delta creatinine noted to be smaller and likely indicating an approach to the plateau phase. He does not have any acute indications for dialysis at this time-without any uremic symptoms, critical electrolytes or volume abnormalities. He is cognizant about following a low potassium diet for now. Continue to monitor for renal recovery-anticipate this will ensue soon. 2. Hypertension: fair BP control, fluid status is euvolemic based on clinical exam 3. Metastatic colon cancer: s/p FOLFOX with clinical improvement but AKI-- await renal recovery prior to redose Tx 4. Anemia/thrombocytopenia: transfusion triggers per oncology  Subjective:   Reports to be feeling well-with several questions regarding dialysis/renal prognosis    Objective:   BP 125/62 mmHg  Pulse 70  Temp(Src) 97.9 F (36.6 C) (Oral)  Resp 18  Ht 5\' 7"  (1.702 m)  Wt 62.5 kg (137 lb 12.6 oz)  BMI 21.58 kg/m2  SpO2 94%  Intake/Output Summary (Last 24 hours) at 03/06/15 0951 Last data filed at 03/06/15 0500  Gross per 24 hour  Intake    240 ml  Output   1550 ml  Net  -1310 ml   Weight change:   Physical Exam: Gen: Comfortably sitting up in a recliner eating breakfast CVS: Pulse regular in rate and rhythm, S1 and S2 normal. No rubs. Resp: Clear to auscultation, no rales Abd: Soft, flat, nontender Ext: No lower extremity edema  Imaging: No results found.  Labs: BMET  Recent Labs Lab 02/28/15 2319 03/01/15 0430 03/01/15 1005 03/02/15 0511 03/03/15 1015 03/04/15 0455 03/05/15 0510 03/06/15 0500  NA 138 141 140 141 139  141 140 137 136  K 6.9* 7.0* 5.1 5.9* 4.2  4.3 4.2 3.9 3.7  CL 105 108 103 106 106  106 103 102 100  CO2  18* 24  --  27 27  28 28 27 27   GLUCOSE 143* 145* 115* 76 79  82 85 93 93  BUN 110* 107* 61* 68* 28*  28* 36* 46* 52*  CREATININE 9.97* 10.26* 6.50* 7.77* 4.61*  4.64* 5.84* 6.65* 6.98*  CALCIUM 9.4 9.8  --  9.0 8.3*  8.5 8.5 8.7 8.8  PHOS  --   --   --   --  4.0 5.4* 5.6* 5.6*   CBC  Recent Labs Lab 02/28/15 1210 02/28/15 1454  03/03/15 1015 03/04/15 0455 03/05/15 0510 03/06/15 0500  WBC 3.3* 3.3*  < > 4.1 3.6* 4.1 4.8  NEUTROABS 3.0 2.9  --   --   --   --   --   HGB 11.9* 11.6*  < > 8.9* 8.4* 9.2* 9.2*  HCT 38.2* 36.5*  < > 28.4* 26.6* 28.4* 28.6*  MCV 77.3* 77.5*  < > 78.2 77.3* 76.5* 77.5*  PLT 60* 47*  < > 44* 55* 67* 78*  < > = values in this interval not displayed.  Medications:    . alteplase  2 mg Intracatheter Once  . antiseptic oral rinse  7 mL Mouth Rinse q12n4p  . chlorhexidine  15 mL Mouth Rinse BID  . dorzolamide-timolol  1 drop Left Eye BID  . loteprednol  1 drop Left Eye QODAY  . pantoprazole  40 mg Oral Daily  . polyethylene glycol  17 g Oral Daily  .  sodium chloride  3 mL Intravenous Q12H  . vitamin B-12  1,000 mcg Oral Q breakfast   Elmarie Shiley, MD 03/06/2015, 9:51 AM

## 2015-03-07 LAB — RENAL FUNCTION PANEL
ANION GAP: 10 (ref 5–15)
Albumin: 2.5 g/dL — ABNORMAL LOW (ref 3.5–5.2)
BUN: 59 mg/dL — AB (ref 6–23)
CALCIUM: 8.8 mg/dL (ref 8.4–10.5)
CO2: 26 mmol/L (ref 19–32)
Chloride: 102 mmol/L (ref 96–112)
Creatinine, Ser: 6.84 mg/dL — ABNORMAL HIGH (ref 0.50–1.35)
GFR calc Af Amer: 8 mL/min — ABNORMAL LOW (ref >90)
GFR calc non Af Amer: 7 mL/min — ABNORMAL LOW (ref >90)
Glucose, Bld: 110 mg/dL — ABNORMAL HIGH (ref 70–99)
POTASSIUM: 3.7 mmol/L (ref 3.5–5.1)
Phosphorus: 6.5 mg/dL — ABNORMAL HIGH (ref 2.3–4.6)
Sodium: 138 mmol/L (ref 135–145)

## 2015-03-07 LAB — CBC
HCT: 26.9 % — ABNORMAL LOW (ref 39.0–52.0)
Hemoglobin: 8.9 g/dL — ABNORMAL LOW (ref 13.0–17.0)
MCH: 25.1 pg — ABNORMAL LOW (ref 26.0–34.0)
MCHC: 33.1 g/dL (ref 30.0–36.0)
MCV: 75.8 fL — AB (ref 78.0–100.0)
PLATELETS: 78 10*3/uL — AB (ref 150–400)
RBC: 3.55 MIL/uL — ABNORMAL LOW (ref 4.22–5.81)
RDW: 18.2 % — AB (ref 11.5–15.5)
WBC: 3.1 10*3/uL — AB (ref 4.0–10.5)

## 2015-03-07 MED ORDER — LANTHANUM CARBONATE 500 MG PO CHEW
1000.0000 mg | CHEWABLE_TABLET | Freq: Three times a day (TID) | ORAL | Status: DC
  Administered 2015-03-07 – 2015-03-10 (×9): 1000 mg via ORAL
  Filled 2015-03-07 (×12): qty 2

## 2015-03-07 MED ORDER — BISACODYL 5 MG PO TBEC
10.0000 mg | DELAYED_RELEASE_TABLET | Freq: Every day | ORAL | Status: DC
  Administered 2015-03-08: 10 mg via ORAL
  Filled 2015-03-07 (×5): qty 2

## 2015-03-07 NOTE — Progress Notes (Signed)
Medicare Important Message given?  YES (If response is "NO", the following Medicare IM given date fields will be blank) Date Medicare IM given:03/07/15 Medicare IM given by:  Tomi Bamberger

## 2015-03-07 NOTE — Progress Notes (Signed)
Subjective: Interval History: has complaints feet swelling .  Objective: Vital signs in last 24 hours: Temp:  [97.2 F (36.2 C)-97.9 F (36.6 C)] 97.9 F (36.6 C) (03/14 0600) Pulse Rate:  [71-79] 71 (03/14 0600) Resp:  [16-18] 18 (03/14 0600) BP: (119-123)/(58-64) 122/58 mmHg (03/14 0600) SpO2:  [92 %-94 %] 94 % (03/14 0600) Weight change:   Intake/Output from previous day: 03/13 0701 - 03/14 0700 In: 480 [P.O.:480] Out: 1600 [Urine:1600] Intake/Output this shift: Total I/O In: 240 [P.O.:240] Out: 500 [Urine:500]  General appearance: alert, cooperative and pale Resp: diminished breath sounds bilaterally Cardio: S1, S2 normal and systolic murmur: holosystolic 2/6, blowing at apex GI: soft, non-tender; bowel sounds normal; no masses,  no organomegaly Extremities: edema 2+  Lab Results:  Recent Labs  03/06/15 0500 03/07/15 0442  WBC 4.8 3.1*  HGB 9.2* 8.9*  HCT 28.6* 26.9*  PLT 78* 78*   BMET:  Recent Labs  03/06/15 0500 03/07/15 0442  NA 136 138  K 3.7 3.7  CL 100 102  CO2 27 26  GLUCOSE 93 110*  BUN 52* 59*  CREATININE 6.98* 6.84*  CALCIUM 8.8 8.8   No results for input(s): PTH in the last 72 hours. Iron Studies: No results for input(s): IRON, TIBC, TRANSFERRIN, FERRITIN in the last 72 hours.  Studies/Results: No results found.  I have reviewed the patient's current medications.  Assessment/Plan: 1  AKI Cr plateau at very low GFR.  Will cont to follow, no indic for HD.  Chemotx induced 2 Anemia stable 3 Low ptlt  Chemo 4 metastatic Ca 5 COPD 6 DM P follow Cr, hold off diuretics at this time.      LOS: 7 days   Marv Alfrey,Lane L 03/07/2015,11:16 AM

## 2015-03-07 NOTE — Progress Notes (Signed)
[facility] Progress Note   [name] ERX:[id number] DOB: October 03, 1941 DOA: 02/28/2015 PCP: Jerlyn Ly, MD  Brief narrative: [name] is a 74 y.o. 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 Benay Spice), COPD, hyponatremia, diabetes mellitus presented to the [place] at [facility] for an appointment secondary to one-week history of nausea and anorexia. Routine blood work was performed and revealed that the patient had AKI with serum creatinine of 9.2 and potassium >7.5 for which he was admitted to the hospital.    Subjective: Continues to feel well.  Assessment/Plan: Principal Problem:   AKI (acute kidney injury)/  Hyperkalemia/   Dehydration - underwent emergent dialysis 3/8 and then again at 3 AM on 3/10 as K and Cr rose again - renal team suspected AKI from Oxaliplatin and volume depletion - Given IV fluids as well as he was also suspected to have a prerenal cause - renal team currently cautiously following Cr level to determine if there is further need for dialysis - Cr has improved today-  no dialysis today- cont to follow- management per renal   Active Problems:  Pedal edema - due to renal failure- follow- ne need for diuretics per renal team    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   DM - cont sliding scale - last A1c 01/06/15 was 6.4    Colon adenocarcinoma - with GOO s/p stent 1/16 - evaluated by Dr Learta Codding in hospital    Thrombocytopenia -Baseline platelets are greater than 150 - Dr Benay Spice recommended to transfuse Plt if count < 10,000 and hold ASA until recovery seen- no h/o CVA or MI    Code Status: full code Family Communication:  Disposition Plan: to be determined DVT prophylaxis: SCDs Consultants:Neprhology, oncology Procedures: HD cath  Antibiotics: Anti-infectives    None      Objective: Filed Weights   02/28/15 2231 03/03/15 0309 03/03/15 0618  Weight: 65.5 kg (144 lb 6.4 oz) 63 kg (138 lb 14.2 oz)  62.5 kg (137 lb 12.6 oz)    Intake/Output Summary (Last 24 hours) at 03/07/15 1136 Last data filed at 03/07/15 1039  Gross per 24 hour  Intake    480 ml  Output   2100 ml  Net  -1620 ml     Vitals Filed Vitals:   03/06/15 0548 03/06/15 1412 03/06/15 2221 03/07/15 0600  BP: 125/62 123/64 119/59 122/58  Pulse: 70 79 77 71  Temp: 97.9 F (36.6 C) 97.4 F (36.3 C) 97.2 F (36.2 C) 97.9 F (36.6 C)  TempSrc: Oral Oral Oral Oral  Resp: 18 16 18 18   Height:      Weight:      SpO2: 94% 92% 93% 94%    Exam:  General:  Pt is alert, not in acute distress  HEENT: No icterus, No thrush  Cardiovascular: regular rate and rhythm, S1/S2 No murmur  Respiratory: clear to auscultation bilaterally   Abdomen: Soft, +Bowel sounds, non tender, non distended, no guarding  MSK: + foot and ankle edema, no cyanosis or clubbing  Data Reviewed: Basic Metabolic Panel:  Recent Labs Lab 03/03/15 1015 03/04/15 0455 03/05/15 0510 03/06/15 0500 03/07/15 0442  NA 139  141 140 137 136 138  K 4.2  4.3 4.2 3.9 3.7 3.7  CL 106  106 103 102 100 102  CO2 27  28 28 27 27 26   GLUCOSE 79  82 85 93 93 110*  BUN 28*  28* 36* 46* 52* 59*  CREATININE 4.61*  4.64* 5.84* 6.65* 6.98* 6.84*  CALCIUM 8.3*  8.5 8.5 8.7 8.8 8.8  PHOS 4.0 5.4* 5.6* 5.6* 6.5*   Liver Function Tests:  Recent Labs Lab 02/28/15 1211  02/28/15 1639 03/01/15 0430 03/03/15 1015 03/04/15 0455 03/05/15 0510 03/06/15 0500 03/07/15 0442  AST 14  --  17 16  --   --   --   --   --   ALT 10  --  13 11  --   --   --   --   --   ALKPHOS 96  --  86 72  --   --   --   --   --   BILITOT 0.57  --  0.7 0.5  --   --   --   --   --   PROT 5.9*  --  5.9* 4.7*  --   --   --   --   --   ALBUMIN 2.9*  < > 3.2* 2.4* 2.3* 2.2* 2.4* 2.4* 2.5*  < > = values in this interval not displayed. No results for input(s): LIPASE, AMYLASE in the last 168 hours. No results for input(s): AMMONIA in the last 168 hours. CBC:  Recent Labs Lab  02/28/15 1210 02/28/15 1454  03/03/15 1015 03/04/15 0455 03/05/15 0510 03/06/15 0500 03/07/15 0442  WBC 3.3* 3.3*  < > 4.1 3.6* 4.1 4.8 3.1*  NEUTROABS 3.0 2.9  --   --   --   --   --   --   HGB 11.9* 11.6*  < > 8.9* 8.4* 9.2* 9.2* 8.9*  HCT 38.2* 36.5*  < > 28.4* 26.6* 28.4* 28.6* 26.9*  MCV 77.3* 77.5*  < > 78.2 77.3* 76.5* 77.5* 75.8*  PLT 60* 47*  < > 44* 55* 67* 78* 78*  < > = values in this interval not displayed. Cardiac Enzymes: No results for input(s): CKTOTAL, CKMB, CKMBINDEX, TROPONINI in the last 168 hours. BNP (last 3 results) No results for input(s): BNP in the last 8760 hours.  ProBNP (last 3 results) No results for input(s): PROBNP in the last 8760 hours.  CBG:  Recent Labs Lab 03/04/15 0746 03/04/15 1149 03/04/15 1701 03/04/15 2205 03/05/15 0821  GLUCAP 85 99 87 112* 76    No results found for this or any previous visit (from the past 240 hour(s)).   Studies:  Recent x-ray studies have been reviewed in detail by the Attending Physician  Scheduled Meds:  Scheduled Meds: . alteplase  2 mg Intracatheter Once  . antiseptic oral rinse  7 mL Mouth Rinse q12n4p  . chlorhexidine  15 mL Mouth Rinse BID  . dorzolamide-timolol  1 drop Left Eye BID  . lanthanum  1,000 mg Oral TID WC  . loteprednol  1 drop Left Eye QODAY  . pantoprazole  40 mg Oral Daily  . polyethylene glycol  17 g Oral Daily  . sodium chloride  3 mL Intravenous Q12H  . vitamin B-12  1,000 mcg Oral Q breakfast   Continuous Infusions:    Time spent on care of this patient: 35 min   [place], MD 03/07/2015, 11:36 AM  LOS: 7 days   [facility] Office  [phone] Pager - Text Page per [email or web address]  If 7PM-7AM, please contact night-coverage [email or web address]

## 2015-03-08 LAB — RENAL FUNCTION PANEL
Albumin: 2.5 g/dL — ABNORMAL LOW (ref 3.5–5.2)
Anion gap: 11 (ref 5–15)
BUN: 61 mg/dL — ABNORMAL HIGH (ref 6–23)
CO2: 26 mmol/L (ref 19–32)
CREATININE: 6.38 mg/dL — AB (ref 0.50–1.35)
Calcium: 8.7 mg/dL (ref 8.4–10.5)
Chloride: 101 mmol/L (ref 96–112)
GFR calc Af Amer: 9 mL/min — ABNORMAL LOW (ref >90)
GFR calc non Af Amer: 8 mL/min — ABNORMAL LOW (ref >90)
Glucose, Bld: 89 mg/dL (ref 70–99)
POTASSIUM: 3.5 mmol/L (ref 3.5–5.1)
Phosphorus: 6.2 mg/dL — ABNORMAL HIGH (ref 2.3–4.6)
Sodium: 138 mmol/L (ref 135–145)

## 2015-03-08 NOTE — Progress Notes (Signed)
[facility] Progress Note   [name] TDV:[id number] DOB: 10-Feb-1941 DOA: 02/28/2015 PCP: Jerlyn Ly, MD  Brief narrative: [name] is a 74 y.o. 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 Benay Spice), COPD, hyponatremia, diabetes mellitus presented to the [place] at [facility] for an appointment secondary to one-week history of nausea and anorexia. Routine blood work was performed and revealed that the patient had AKI with serum creatinine of 9.2 and potassium >7.5 for which he was admitted to the hospital.    Subjective: No complaints.   Assessment/Plan: Principal Problem:   AKI (acute kidney injury)/  Hyperkalemia/   Dehydration - underwent emergent dialysis 3/8 and then again at 3 AM on 3/10 as K and Cr rose again - renal team suspected AKI from Oxaliplatin and volume depletion - Given IV fluids as well as he was also suspected to have a prerenal cause - renal team currently cautiously following Cr level to determine if there is further need for dialysis - Cr has continued to improve- possible d/c on Thursday as mentioned in note by renal team  Active Problems:  Pedal edema - improving- keeping feel elevated - due to renal failure- follow- ne need for diuretics per renal team    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   DM - cont sliding scale - last A1c 01/06/15 was 6.4    Colon adenocarcinoma - with GOO s/p stent 1/16 - evaluated by Dr Learta Codding in hospital    Thrombocytopenia -Baseline platelets are greater than 150 - Dr Benay Spice recommended to transfuse Plt if count < 10,000 and hold ASA until recovery seen- no h/o CVA or MI    Code Status: full code Family Communication:  Disposition Plan: to be determined DVT prophylaxis: SCDs Consultants:Neprhology, oncology Procedures: HD cath  Antibiotics: Anti-infectives    None      Objective: Filed Weights   03/03/15 0309 03/03/15 0618 03/08/15 0456  Weight: 63 kg (138  lb 14.2 oz) 62.5 kg (137 lb 12.6 oz) 59.285 kg (130 lb 11.2 oz)    Intake/Output Summary (Last 24 hours) at 03/08/15 1457 Last data filed at 03/08/15 1416  Gross per 24 hour  Intake    630 ml  Output   2601 ml  Net  -1971 ml     Vitals Filed Vitals:   03/07/15 2201 03/08/15 0456 03/08/15 0641 03/08/15 1414  BP: 120/54  118/52 131/65  Pulse: 78  69 65  Temp: 98.4 F (36.9 C)  97.7 F (36.5 C) 97.5 F (36.4 C)  TempSrc: Oral  Oral Oral  Resp: 18  18 16   Height:      Weight:  59.285 kg (130 lb 11.2 oz)    SpO2: 93%  97% 99%    Exam:  General:  Pt is alert, not in acute distress  HEENT: No icterus, No thrush  Cardiovascular: regular rate and rhythm, S1/S2 No murmur  Respiratory: clear to auscultation bilaterally   Abdomen: Soft, +Bowel sounds, non tender, non distended, no guarding  MSK: + mild ankle edema, no cyanosis or clubbing  Data Reviewed: Basic Metabolic Panel:  Recent Labs Lab 03/04/15 0455 03/05/15 0510 03/06/15 0500 03/07/15 0442 03/08/15 0547  NA 140 137 136 138 138  K 4.2 3.9 3.7 3.7 3.5  CL 103 102 100 102 101  CO2 28 27 27 26 26   GLUCOSE 85 93 93 110* 89  BUN 36* 46* 52* 59* 61*  CREATININE 5.84* 6.65* 6.98* 6.84* 6.38*  CALCIUM 8.5 8.7 8.8 8.8 8.7  PHOS 5.4* 5.6* 5.6* 6.5* 6.2*   Liver Function Tests:  Recent Labs Lab 03/04/15 0455 03/05/15 0510 03/06/15 0500 03/07/15 0442 03/08/15 0547  ALBUMIN 2.2* 2.4* 2.4* 2.5* 2.5*   No results for input(s): LIPASE, AMYLASE in the last 168 hours. No results for input(s): AMMONIA in the last 168 hours. CBC:  Recent Labs Lab 03/03/15 1015 03/04/15 0455 03/05/15 0510 03/06/15 0500 03/07/15 0442  WBC 4.1 3.6* 4.1 4.8 3.1*  HGB 8.9* 8.4* 9.2* 9.2* 8.9*  HCT 28.4* 26.6* 28.4* 28.6* 26.9*  MCV 78.2 77.3* 76.5* 77.5* 75.8*  PLT 44* 55* 67* 78* 78*   Cardiac Enzymes: No results for input(s): CKTOTAL, CKMB, CKMBINDEX, TROPONINI in the last 168 hours. BNP (last 3 results) No results for  input(s): BNP in the last 8760 hours.  ProBNP (last 3 results) No results for input(s): PROBNP in the last 8760 hours.  CBG:  Recent Labs Lab 03/04/15 0746 03/04/15 1149 03/04/15 1701 03/04/15 2205 03/05/15 0821  GLUCAP 85 99 87 112* 76    No results found for this or any previous visit (from the past 240 hour(s)).   Studies:  Recent x-ray studies have been reviewed in detail by the Attending Physician  Scheduled Meds:  Scheduled Meds: . alteplase  2 mg Intracatheter Once  . antiseptic oral rinse  7 mL Mouth Rinse q12n4p  . bisacodyl  10 mg Oral Daily  . chlorhexidine  15 mL Mouth Rinse BID  . dorzolamide-timolol  1 drop Left Eye BID  . lanthanum  1,000 mg Oral TID WC  . loteprednol  1 drop Left Eye QODAY  . pantoprazole  40 mg Oral Daily  . polyethylene glycol  17 g Oral Daily  . sodium chloride  3 mL Intravenous Q12H  . vitamin B-12  1,000 mcg Oral Q breakfast   Continuous Infusions:    Time spent on care of this patient: 35 min   [place]ier, MD 03/08/2015, 2:57 PM  LOS: 8 days   [facility] Office  [phone] Pager - Text Page per [email or web address]  If 7PM-7AM, please contact night-coverage [email or web address]

## 2015-03-08 NOTE — Progress Notes (Signed)
NUTRITION FOLLOW UP  Pt meets criteria for severe MALNUTRITION in the context of chronic illness as evidenced by 10% wt loss x 6 months, <75% estimated energy intake x 1 month.  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 Benay Spice), COPD, hyponatremia, diabetes mellitus presented to the [place] at [facility] today for an appointment secondary to one-week history of nausea and anorexia.  Pt has been advanced to a regular diet. He is tolerating well and intake has improved greatly- PO: 80-100%. Pt refusing supplements because he does not like the taste, but is eating much better since admission.  Per RNCM, nephrology continues to follow closely for creatinine levels- diuretics are currently being held.  Labs reviewed. BUN/Creat: 61/6.38, Phos: 6.2.   Height: Ht Readings from Last 1 Encounters:  02/28/15 _0  (1.702 m)    Weight Status:   Wt Readings from Last 1 Encounters:  03/08/15 130 lb 11.2 oz (59.285 kg)   02/28/15 144 lb 6.4 oz (65.5 kg)        Re-estimated needs:  Kcal: 1700-1900 Protein: 90-100 grams Fluid: 1.7-1.9 L  Skin: Intact  Diet Order: Diet regular   Intake/Output Summary (Last 24 hours) at 03/08/15 0936 Last data filed at 03/08/15 0148  Gross per 24 hour  Intake    600 ml  Output   2251 ml  Net  -1651 ml    Last BM: 03/07/15   Labs:   Recent Labs Lab 03/06/15 0500 03/07/15 0442 03/08/15 0547  NA 136 138 138  K 3.7 3.7 3.5  CL 100 102 101  CO2 _1 BUN 52* 59* 61*  CREATININE 6.98* 6.84* 6.38*  CALCIUM 8.8 8.8 8.7  PHOS 5.6* 6.5* 6.2*  GLUCOSE  93 110* 89    CBG (last 3)  No results for input(s): GLUCAP in the last 72 hours.  Scheduled Meds: . alteplase  2 mg Intracatheter Once  . antiseptic oral rinse  7 mL Mouth Rinse q12n4p  . bisacodyl  10 mg Oral Daily  . chlorhexidine  15 mL Mouth Rinse BID  . dorzolamide-timolol  1 drop Left Eye BID  . lanthanum  1,000 mg Oral TID WC  . loteprednol  1 drop Left Eye QODAY  . pantoprazole  40 mg Oral Daily  . polyethylene glycol  17 g Oral Daily  . sodium chloride  3 mL Intravenous Q12H  . vitamin B-12  1,000 mcg Oral Q breakfast    Continuous Infusions:   Juanell Saffo A. Jimmye Norman, RD, LDN, CDE Pager: [phone] After hours Pager: [phone]

## 2015-03-08 NOTE — Progress Notes (Signed)
Patient ID: [name], male   DOB: 1941/10/23, 74 y.o.   MRN: [id number]  [facility] Progress Note   Assessment/ Plan:   1. AKI: suspected to be secondary to Oxaliplatin nephrotoxicity and volume depletion/ATN. Good urine output and creatinine trending down the last 2 days with BUN lagging. Last HD was on 3/9.  He does not have any acute indications for dialysis at this time-without any uremic symptoms, critical electrolytes or volume abnormalities. Potassium is even low normal now.  Continue to monitor renal function.  I am going to still leave the vascath in for now but if creatinine cont to trend in right direction and the BUN follows- possible D/C but Thursday ?? 2. Hypertension: fair BP control, fluid status is euvolemic based on clinical exam- just a little LE edema 3. Metastatic colon cancer: s/p FOLFOX with clinical improvement but AKI-- await renal recovery prior to redose Tx 4. Anemia/thrombocytopenia: transfusion triggers per oncology- low but stable 5. Hyperphosphatemia- been started on fosrenol- likely will not need to continue as OP     Subjective:   Reports to be feeling well-with several questions regarding dialysis/renal prognosis    Objective:   BP 118/52 mmHg  Pulse 69  Temp(Src) 97.7 F (36.5 C) (Oral)  Resp 18  Ht 5\' 7"  (1.702 m)  Wt 59.285 kg (130 lb 11.2 oz)  BMI 20.47 kg/m2  SpO2 97%  Intake/Output Summary (Last 24 hours) at 03/08/15 1250 Last data filed at 03/08/15 1236  Gross per 24 hour  Intake    510 ml  Output   2351 ml  Net  -1841 ml   Weight change:   Physical Exam: Gen: Comfortably sitting up in a recliner eating breakfast CVS: Pulse regular in rate and rhythm, S1 and S2 normal. No rubs. Resp: Clear to auscultation, no rales Abd: Soft, flat, nontender Ext: No lower extremity edema  Imaging: No results found.  Labs: BMET  Recent Labs Lab 03/02/15 0511 03/03/15 1015 03/04/15 0455 03/05/15 0510 03/06/15 0500  03/07/15 0442 03/08/15 0547  NA 141 139  141 140 137 136 138 138  K 5.9* 4.2  4.3 4.2 3.9 3.7 3.7 3.5  CL 106 106  106 103 102 100 102 101  CO2 27 27  28 28 27 27 26 26   GLUCOSE 76 79  82 85 93 93 110* 89  BUN 68* 28*  28* 36* 46* 52* 59* 61*  CREATININE 7.77* 4.61*  4.64* 5.84* 6.65* 6.98* 6.84* 6.38*  CALCIUM 9.0 8.3*  8.5 8.5 8.7 8.8 8.8 8.7  PHOS  --  4.0 5.4* 5.6* 5.6* 6.5* 6.2*   CBC  Recent Labs Lab 03/04/15 0455 03/05/15 0510 03/06/15 0500 03/07/15 0442  WBC 3.6* 4.1 4.8 3.1*  HGB 8.4* 9.2* 9.2* 8.9*  HCT 26.6* 28.4* 28.6* 26.9*  MCV 77.3* 76.5* 77.5* 75.8*  PLT 55* 67* 78* 78*    Medications:    . alteplase  2 mg Intracatheter Once  . antiseptic oral rinse  7 mL Mouth Rinse q12n4p  . bisacodyl  10 mg Oral Daily  . chlorhexidine  15 mL Mouth Rinse BID  . dorzolamide-timolol  1 drop Left Eye BID  . lanthanum  1,000 mg Oral TID WC  . loteprednol  1 drop Left Eye QODAY  . pantoprazole  40 mg Oral Daily  . polyethylene glycol  17 g Oral Daily  . sodium chloride  3 mL Intravenous Q12H  . vitamin B-12  1,000 mcg Oral Q breakfast  Danika Kluender A   03/08/2015, 12:50 PM

## 2015-03-08 NOTE — Progress Notes (Addendum)
Shift Event: Pharmacy inquiring about acetylcystine order placed on pt for tylenol toxicity. Record reviewed, no evidence of tylenol toxicity/OD.  MD please address in am  Lacy Duverney [facility]

## 2015-03-09 ENCOUNTER — Other Ambulatory Visit: Payer: Medicare Other

## 2015-03-09 ENCOUNTER — Ambulatory Visit: Payer: Medicare Other

## 2015-03-09 ENCOUNTER — Ambulatory Visit: Payer: Medicare Other | Admitting: Oncology

## 2015-03-09 ENCOUNTER — Other Ambulatory Visit: Payer: Self-pay | Admitting: Nurse Practitioner

## 2015-03-09 DIAGNOSIS — C799 Secondary malignant neoplasm of unspecified site: Secondary | ICD-10-CM

## 2015-03-09 DIAGNOSIS — C189 Malignant neoplasm of colon, unspecified: Secondary | ICD-10-CM

## 2015-03-09 DIAGNOSIS — E876 Hypokalemia: Secondary | ICD-10-CM

## 2015-03-09 LAB — RENAL FUNCTION PANEL
Albumin: 2.6 g/dL — ABNORMAL LOW (ref 3.5–5.2)
Anion gap: 6 (ref 5–15)
BUN: 62 mg/dL — ABNORMAL HIGH (ref 6–23)
CHLORIDE: 101 mmol/L (ref 96–112)
CO2: 30 mmol/L (ref 19–32)
Calcium: 8.8 mg/dL (ref 8.4–10.5)
Creatinine, Ser: 5.82 mg/dL — ABNORMAL HIGH (ref 0.50–1.35)
GFR calc Af Amer: 10 mL/min — ABNORMAL LOW (ref >90)
GFR, EST NON AFRICAN AMERICAN: 9 mL/min — AB (ref >90)
Glucose, Bld: 91 mg/dL (ref 70–99)
Phosphorus: 5.7 mg/dL — ABNORMAL HIGH (ref 2.3–4.6)
Potassium: 3.3 mmol/L — ABNORMAL LOW (ref 3.5–5.1)
Sodium: 137 mmol/L (ref 135–145)

## 2015-03-09 MED ORDER — POTASSIUM CHLORIDE CRYS ER 20 MEQ PO TBCR
40.0000 meq | EXTENDED_RELEASE_TABLET | Freq: Once | ORAL | Status: AC
  Administered 2015-03-09: 40 meq via ORAL
  Filled 2015-03-09: qty 2

## 2015-03-09 NOTE — Progress Notes (Signed)
Subjective: Interval History: has no complaint .  Objective: Vital signs in last 24 hours: Temp:  [97.5 F (36.4 C)-98 F (36.7 C)] 98 F (36.7 C) (03/16 0522) Pulse Rate:  [65-66] 65 (03/16 0522) Resp:  [16-18] 16 (03/16 0522) BP: (123-131)/(55-67) 127/55 mmHg (03/16 0522) SpO2:  [94 %-99 %] 94 % (03/16 0522) Weight change:   Intake/Output from previous day: 03/15 0701 - 03/16 0700 In: 510 [P.O.:510] Out: 2150 [Urine:2150] Intake/Output this shift: Total I/O In: 240 [P.O.:240] Out: 675 [Urine:675]  General appearance: alert, cooperative, no distress and pale Resp: diminished breath sounds bilaterally and rales bibasilar Cardio: S1, S2 normal and systolic murmur: holosystolic 2/6, blowing at apex GI: soft, non-tender; bowel sounds normal; no masses,  no organomegaly Extremities: edema 2+  Lab Results:  Recent Labs  03/07/15 0442  WBC 3.1*  HGB 8.9*  HCT 26.9*  PLT 78*   BMET:  Recent Labs  03/08/15 0547 03/09/15 0505  NA 138 137  K 3.5 3.3*  CL 101 101  CO2 26 30  GLUCOSE 89 91  BUN 61* 62*  CREATININE 6.38* 5.82*  CALCIUM 8.7 8.8   No results for input(s): PTH in the last 72 hours. Iron Studies: No results for input(s): IRON, TIBC, TRANSFERRIN, FERRITIN in the last 72 hours.  Studies/Results: No results found.  I have reviewed the patient's current medications.  Assessment/Plan: 1  AKI slowly better.   Non oliguric and improving vol.  If better Cr in am , ok to d/c and f/u outpatient 2 Anemia 3 Adenoca 4 COPD 5 DM P follow Cr ,if ok, d/c IJ and D/C    LOS: 9 days   Cienna Dumais,Brentlee L 03/09/2015,10:42 AM

## 2015-03-09 NOTE — Progress Notes (Signed)
[facility] Progress Note   [name] OAC:[id number] DOB: [birth year] DOA: 02/28/2015 PCP: Jerlyn Ly, MD  Brief narrative: [name] is a 74 y.o. 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 Benay Spice), COPD, hyponatremia, diabetes mellitus presented to the [place] at [facility] for an appointment secondary to one-week history of nausea and anorexia. Routine blood work was performed and revealed that the patient had AKI with serum creatinine of 9.2 and potassium >7.5 for which he was admitted to the hospital.    Subjective: Without complaints.   Assessment/Plan: Principal Problem:   AKI (acute kidney injury)/  Hyperkalemia/   Dehydration - underwent emergent dialysis 3/8 and then again at 3 AM on 3/10 as K and Cr rose again - renal team suspected AKI from Oxaliplatin and volume depletion - Given IV fluids as well as he was also suspected to have a prerenal cause - renal team currently cautiously following Cr level to determine if there is further need for dialysis - Cr has continued to improve- possible d/c on Thursday as mentioned in notes by renal team  Active Problems:  Hypokalemia - replaced today  Pedal edema - improving- keeping feel elevated - due to renal failure- follow- ne need for diuretics per renal team    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   DM - cont sliding scale - last A1c 01/06/15 was 6.4    Colon adenocarcinoma - with GOO s/p stent 1/16 - evaluated by Dr Learta Codding in hospital    Thrombocytopenia -Baseline platelets are greater than 150 - Dr Benay Spice recommended to transfuse Plt if count < 10,000 and hold ASA until recovery seen- no h/o CVA or MI    Code Status: full code Family Communication:  Disposition Plan:hopefully home tomorrow DVT prophylaxis: SCDs Consultants:Neprhology, oncology Procedures: HD cath  Antibiotics: Anti-infectives    None      Objective: Filed Weights   03/03/15 0309  03/03/15 0618 03/08/15 0456  Weight: 63 kg (138 lb 14.2 oz) 62.5 kg (137 lb 12.6 oz) 59.285 kg (130 lb 11.2 oz)    Intake/Output Summary (Last 24 hours) at 03/09/15 1112 Last data filed at 03/09/15 1023  Gross per 24 hour  Intake    600 ml  Output   2825 ml  Net  -2225 ml     Vitals Filed Vitals:   03/08/15 0641 03/08/15 1414 03/08/15 2114 03/09/15 0522  BP: 118/52 131/65 123/67 127/55  Pulse: 69 65 66 65  Temp: 97.7 F (36.5 C) 97.5 F (36.4 C) 98 F (36.7 C) 98 F (36.7 C)  TempSrc: Oral Oral Oral Oral  Resp: 18 16 18 16   Height:      Weight:      SpO2: 97% 99% 96% 94%    Exam:  General:  Pt is alert, not in acute distress  HEENT: No icterus, No thrush  Cardiovascular: regular rate and rhythm, S1/S2 No murmur  Respiratory: clear to auscultation bilaterally   Abdomen: Soft, +Bowel sounds, non tender, non distended, no guarding  MSK: + mild ankle edema, no cyanosis or clubbing  Data Reviewed: Basic Metabolic Panel:  Recent Labs Lab 03/05/15 0510 03/06/15 0500 03/07/15 0442 03/08/15 0547 03/09/15 0505  NA 137 136 138 138 137  K 3.9 3.7 3.7 3.5 3.3*  CL 102 100 102 101 101  CO2 27 27 26 26 30   GLUCOSE 93 93 110* 89 91  BUN 46* 52* 59* 61* 62*  CREATININE 6.65* 6.98* 6.84*  6.38* 5.82*  CALCIUM 8.7 8.8 8.8 8.7 8.8  PHOS 5.6* 5.6* 6.5* 6.2* 5.7*   Liver Function Tests:  Recent Labs Lab 03/05/15 0510 03/06/15 0500 03/07/15 0442 03/08/15 0547 03/09/15 0505  ALBUMIN 2.4* 2.4* 2.5* 2.5* 2.6*   No results for input(s): LIPASE, AMYLASE in the last 168 hours. No results for input(s): AMMONIA in the last 168 hours. CBC:  Recent Labs Lab 03/03/15 1015 03/04/15 0455 03/05/15 0510 03/06/15 0500 03/07/15 0442  WBC 4.1 3.6* 4.1 4.8 3.1*  HGB 8.9* 8.4* 9.2* 9.2* 8.9*  HCT 28.4* 26.6* 28.4* 28.6* 26.9*  MCV 78.2 77.3* 76.5* 77.5* 75.8*  PLT 44* 55* 67* 78* 78*   Cardiac Enzymes: No results for input(s): CKTOTAL, CKMB, CKMBINDEX, TROPONINI in the  last 168 hours. BNP (last 3 results) No results for input(s): BNP in the last 8760 hours.  ProBNP (last 3 results) No results for input(s): PROBNP in the last 8760 hours.  CBG:  Recent Labs Lab 03/04/15 0746 03/04/15 1149 03/04/15 1701 03/04/15 2205 03/05/15 0821  GLUCAP 85 99 87 112* 76    No results found for this or any previous visit (from the past 240 hour(s)).   Studies:  Recent x-ray studies have been reviewed in detail by the Attending Physician  Scheduled Meds:  Scheduled Meds: . alteplase  2 mg Intracatheter Once  . antiseptic oral rinse  7 mL Mouth Rinse q12n4p  . bisacodyl  10 mg Oral Daily  . chlorhexidine  15 mL Mouth Rinse BID  . dorzolamide-timolol  1 drop Left Eye BID  . lanthanum  1,000 mg Oral TID WC  . loteprednol  1 drop Left Eye QODAY  . pantoprazole  40 mg Oral Daily  . polyethylene glycol  17 g Oral Daily  . sodium chloride  3 mL Intravenous Q12H  . vitamin B-12  1,000 mcg Oral Q breakfast   Continuous Infusions:    Time spent on care of this patient: 35 min   [place], MD 03/09/2015, 11:12 AM  LOS: 9 days   [facility] Office  [phone] Pager - Text Page per [email or web address]  If 7PM-7AM, please contact night-coverage [email or web address]

## 2015-03-10 LAB — RENAL FUNCTION PANEL
Albumin: 2.7 g/dL — ABNORMAL LOW (ref 3.5–5.2)
Anion gap: 13 (ref 5–15)
BUN: 59 mg/dL — AB (ref 6–23)
CO2: 22 mmol/L (ref 19–32)
Calcium: 9 mg/dL (ref 8.4–10.5)
Chloride: 103 mmol/L (ref 96–112)
Creatinine, Ser: 5.02 mg/dL — ABNORMAL HIGH (ref 0.50–1.35)
GFR calc non Af Amer: 10 mL/min — ABNORMAL LOW (ref >90)
GFR, EST AFRICAN AMERICAN: 12 mL/min — AB (ref >90)
Glucose, Bld: 86 mg/dL (ref 70–99)
Phosphorus: 4.7 mg/dL — ABNORMAL HIGH (ref 2.3–4.6)
Potassium: 3.7 mmol/L (ref 3.5–5.1)
Sodium: 138 mmol/L (ref 135–145)

## 2015-03-10 NOTE — Discharge Summary (Signed)
Physician Discharge Summary  [name] MVH:[id number] DOB: [birth year] DOA: 02/28/2015  PCP: Jerlyn Ly, MD  Admit date: 02/28/2015 Discharge date: 03/10/2015  Time spent: 45 minutes  Recommendations for Outpatient Follow-up:  1. Renal has arragned for outpt labwork  Discharge Condition: stable Diet recommendation: heart healthy, renal diet- discussed with patient  Discharge Diagnoses:  Principal Problem:   AKI (acute kidney injury) Active Problems:   COPD with emphysema   Colon adenocarcinoma   Dehydration   Thrombocytopenia   Hyperkalemia   History of present illness:  [name] is a 74 y.o. 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 Benay Spice), COPD, hyponatremia, diabetes mellitus presented to the [place] at [facility] for an appointment secondary to one-week history of nausea and anorexia. Routine blood work was performed and revealed that the patient had AKI with serum creatinine of 9.2 and potassium >7.5 for which he was admitted to the hospital.   Hospital Course:  AKI (acute kidney injury)/ Hyperkalemia/ Dehydration - underwent emergent dialysis 3/8 and then again at 3 AM on 3/10 as K and Cr rose again - renal team suspected AKI from Oxaliplatin and volume depletion - Given IV fluids as well as he was also suspected to have a prerenal cause - renal team currently cautiously following Cr level to determine if there is further need for dialysis - Cr has continued to improve- dialysis cath removed today- nephrology will follow as oupt and have arranged for labs to be drawn   Active Problems:  Hypokalemia - replaced   Pedal edema - improving- keeping feel elevated - due to renal failure- follow- ne need for diuretics per renal team   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   DM -last A1c 01/06/15 was 6.4- diet controlled   Colon adenocarcinoma - with GOO s/p stent 1/16 - evaluated by Dr Learta Codding in hospital    Thrombocytopenia -Baseline platelets are greater than 150 - Dr Benay Spice recommended to transfuse Plt if count < 10,000 - last check they were in 70s - cont to hold ASA until recovery seen- no h/o CVA or MI   Consultants:Neprhology, oncology Procedures: HD cath  Discharge Exam: Filed Weights   03/03/15 0309 03/03/15 0618 03/08/15 0456  Weight: 63 kg (138 lb 14.2 oz) 62.5 kg (137 lb 12.6 oz) 59.285 kg (130 lb 11.2 oz)   Filed Vitals:   03/10/15 0550  BP: 134/52  Pulse: 64  Temp: 97.7 F (36.5 C)  Resp: 18    General: AAO x 3, no distress Cardiovascular: RRR, no murmurs  Respiratory: clear to auscultation bilaterally GI: soft, non-tender, non-distended, bowel sound positive  Discharge Instructions You were cared for by a hospitalist during your hospital stay. If you have any questions about your discharge medications or the care you received while you were in the hospital after you are discharged, you can call the unit and asked to speak with the hospitalist on call if the hospitalist that took care of you is not available. Once you are discharged, your primary care physician will handle any further medical issues. Please note that NO REFILLS for any discharge medications will be authorized once you are discharged, as it is imperative that you return to your primary care physician (or establish a relationship with a primary care physician if you do not have one) for your aftercare needs so that they can reassess your need for medications and monitor your lab values.      Discharge Instructions  Discharge instructions    Complete by:  As directed   Renal, heart healthy diet.     Increase activity slowly    Complete by:  As directed             Medication List    STOP taking these medications        aspirin EC 81 MG tablet     cephALEXin 500 MG capsule  Commonly known as:  KEFLEX     ibuprofen 200 MG tablet  Commonly known as:  ADVIL,MOTRIN      oxyCODONE-acetaminophen 5-325 MG per tablet  Commonly known as:  PERCOCET/ROXICET      TAKE these medications        bisacodyl 5 MG EC tablet  Commonly known as:  DULCOLAX  Take 10 mg by mouth daily as needed for moderate constipation.     diphenoxylate-atropine 2.5-0.025 MG per tablet  Commonly known as:  LOMOTIL  Take 1 tablet by mouth 4 (four) times daily as needed for diarrhea or loose stools. Up to 8 per day     docusate sodium 100 MG capsule  Commonly known as:  COLACE  Take 200 mg by mouth daily as needed for moderate constipation.     dorzolamide-timolol 22.3-6.8 MG/ML ophthalmic solution  Commonly known as:  COSOPT  Place 1 drop into the left eye 2 (two) times daily.     HYDROcodone-acetaminophen 5-325 MG per tablet  Commonly known as:  NORCO/VICODIN  Take 1-2 tablets by mouth every 4 (four) hours as needed for moderate pain.     loratadine 10 MG tablet  Commonly known as:  CLARITIN  Take 10 mg by mouth 2 (two) times daily.     LORazepam 0.5 MG tablet  Commonly known as:  ATIVAN  Take one to two at bedtime for sleep as needed     loteprednol 0.5 % ophthalmic suspension  Commonly known as:  LOTEMAX  Place 1 drop into the left eye every other day.     ondansetron 8 MG tablet  Commonly known as:  ZOFRAN  Take 1 tablet (8 mg total) by mouth every 12 (twelve) hours as needed for nausea.     pantoprazole 40 MG tablet  Commonly known as:  PROTONIX  TAKE 1 TABLET ONCE DAILY.     polyethylene glycol packet  Commonly known as:  MIRALAX / GLYCOLAX  Take 17 g by mouth daily.     PRESCRIPTION MEDICATION  Chemo at [facility]     prochlorperazine 10 MG tablet  Commonly known as:  COMPAZINE  Take 1 tablet (10 mg total) by mouth every 6 (six) hours as needed for nausea or vomiting.     testosterone enanthate 200 MG/ML injection  Commonly known as:  DELATESTRYL  Inject 300 mg into the muscle every 21 ( twenty-one) days. Every 3 weeks currently     vitamin B-12 1000 MCG  tablet  Commonly known as:  CYANOCOBALAMIN  Take 1,000 mcg by mouth daily with breakfast.     Vitamin D3 2000 UNITS Tabs  Take 1 tablet by mouth daily with breakfast.       No Known Allergies    The results of significant diagnostics from this hospitalization (including imaging, microbiology, ancillary and laboratory) are listed below for reference.    Significant Diagnostic Studies: US Renal  03/01/2015   CLINICAL DATA:  Acute kidney injury  EXAM: RENAL/URINARY TRACT ULTRASOUND COMPLETE  COMPARISON:  None.  FINDINGS: Right Kidney:  Length: 11.7 cm.  No mass or hydronephrosis.  Left Kidney:  Length: 12.2 cm.  No mass or hydronephrosis.  Bladder:  Within normal limits.  IMPRESSION: Negative renal ultrasound.   Electronically Signed   By: Julian Hy M.D.   On: 03/01/2015 17:21   Dg Chest Port 1 View  03/01/2015   CLINICAL DATA:  Central line placement.  EXAM: PORTABLE CHEST - 1 VIEW  COMPARISON:  CT chest 11/09/2014  FINDINGS: Shallow inspiration. Atelectasis in the lung bases. Heart size and pulmonary vascularity are normal for technique. Power port toe Infuse-A-Port with tip in the cavoatrial junction. Left central venous catheter with tip over the mid SVC region. No pneumothorax. Surgical clips in the left chest.  IMPRESSION: Appliances appear to be in satisfactory location. Shallow inspiration with atelectasis in the lung bases.   Electronically Signed   By: Lucienne Capers M.D.   On: 03/01/2015 01:31    Microbiology: No results found for this or any previous visit (from the past 240 hour(s)).   Labs: Basic Metabolic Panel:  Recent Labs Lab 03/06/15 0500 03/07/15 0442 03/08/15 0547 03/09/15 0505 03/10/15 0545  NA 136 138 138 137 138  K 3.7 3.7 3.5 3.3* 3.7  CL 100 102 101 101 103  CO2 27 26 26 30 22   GLUCOSE 93 110* 89 91 86  BUN 52* 59* 61* 62* 59*  CREATININE 6.98* 6.84* 6.38* 5.82* 5.02*  CALCIUM 8.8 8.8 8.7 8.8 9.0  PHOS 5.6* 6.5* 6.2* 5.7* 4.7*   Liver  Function Tests:  Recent Labs Lab 03/06/15 0500 03/07/15 0442 03/08/15 0547 03/09/15 0505 03/10/15 0545  ALBUMIN 2.4* 2.5* 2.5* 2.6* 2.7*   No results for input(s): LIPASE, AMYLASE in the last 168 hours. No results for input(s): AMMONIA in the last 168 hours. CBC:  Recent Labs Lab 03/04/15 0455 03/05/15 0510 03/06/15 0500 03/07/15 0442  WBC 3.6* 4.1 4.8 3.1*  HGB 8.4* 9.2* 9.2* 8.9*  HCT 26.6* 28.4* 28.6* 26.9*  MCV 77.3* 76.5* 77.5* 75.8*  PLT 55* 67* 78* 78*   Cardiac Enzymes: No results for input(s): CKTOTAL, CKMB, CKMBINDEX, TROPONINI in the last 168 hours. BNP: BNP (last 3 results) No results for input(s): BNP in the last 8760 hours.  ProBNP (last 3 results) No results for input(s): PROBNP in the last 8760 hours.  CBG:  Recent Labs Lab 03/04/15 0746 03/04/15 1149 03/04/15 1701 03/04/15 2205 03/05/15 0821  GLUCAP 85 99 87 112* 76       SignedDebbe Odea, MD [facility] 03/10/2015, 12:17 PM

## 2015-03-10 NOTE — Progress Notes (Signed)
Subjective: Interval History: has no complaint .  Objective: Vital signs in last 24 hours: Temp:  [97.5 F (36.4 C)-97.7 F (36.5 C)] 97.7 F (36.5 C) (03/17 0550) Pulse Rate:  [64-68] 64 (03/17 0550) Resp:  [16-18] 18 (03/17 0550) BP: (115-134)/(52-58) 134/52 mmHg (03/17 0550) SpO2:  [94 %-98 %] 97 % (03/17 0550) Weight change:   Intake/Output from previous day: 03/16 0701 - 03/17 0700 In: 480 [P.O.:480] Out: 2225 [Urine:2225] Intake/Output this shift: Total I/O In: 240 [P.O.:240] Out: -   General appearance: alert, cooperative, cachectic and pale Resp: diminished breath sounds bilaterally Cardio: S1, S2 normal and systolic murmur: systolic ejection 2/6, decrescendo at 2nd left intercostal space GI: soft, pos bs Extremities: edema 1+  Lab Results: No results for input(s): WBC, HGB, HCT, PLT in the last 72 hours. BMET:  Recent Labs  03/09/15 0505 03/10/15 0545  NA 137 138  K 3.3* 3.7  CL 101 103  CO2 30 22  GLUCOSE 91 86  BUN 62* 59*  CREATININE 5.82* 5.02*  CALCIUM 8.8 9.0   No results for input(s): PTH in the last 72 hours. Iron Studies: No results for input(s): IRON, TIBC, TRANSFERRIN, FERRITIN in the last 72 hours.  Studies/Results: No results found.  I have reviewed the patient's current medications. Prior to Admission:  Prescriptions prior to admission  Medication Sig Dispense Refill Last Dose  . aspirin EC 81 MG tablet Take 81 mg by mouth at bedtime.   02/27/2015 at Unknown time  . bisacodyl (DULCOLAX) 5 MG EC tablet Take 10 mg by mouth daily as needed for moderate constipation.    unknown  . Cholecalciferol (VITAMIN D3) 2000 UNITS TABS Take 1 tablet by mouth daily with breakfast.    02/27/2015 at Unknown time  . diphenoxylate-atropine (LOMOTIL) 2.5-0.025 MG per tablet Take 1 tablet by mouth 4 (four) times daily as needed for diarrhea or loose stools. Up to 8 per day 30 tablet 0 unknown  . docusate sodium (COLACE) 100 MG capsule Take 200 mg by mouth  daily as needed for moderate constipation.    unknown  . dorzolamide-timolol (COSOPT) 22.3-6.8 MG/ML ophthalmic solution Place 1 drop into the left eye 2 (two) times daily.    02/27/2015 at Unknown time  . HYDROcodone-acetaminophen (NORCO/VICODIN) 5-325 MG per tablet Take 1-2 tablets by mouth every 4 (four) hours as needed for moderate pain. (Patient taking differently: Take 1 tablet by mouth every 4 (four) hours as needed for moderate pain. ) 75 tablet 0 unknown  . ibuprofen (ADVIL,MOTRIN) 200 MG tablet Take 200 mg by mouth every 6 (six) hours as needed.    unknown  . loratadine (CLARITIN) 10 MG tablet Take 10 mg by mouth 2 (two) times daily.     02/27/2015 at Unknown time  . LORazepam (ATIVAN) 0.5 MG tablet Take one to two at bedtime for sleep as needed (Patient taking differently: Take 0.5-1 mg by mouth at bedtime as needed. ) 60 tablet 1 Past Week at Unknown time  . loteprednol (LOTEMAX) 0.5 % ophthalmic suspension Place 1 drop into the left eye every other day.    02/27/2015 at Unknown time  . ondansetron (ZOFRAN) 8 MG tablet Take 1 tablet (8 mg total) by mouth every 12 (twelve) hours as needed for nausea. 30 tablet 1 02/27/2015 at Unknown time  . oxyCODONE-acetaminophen (PERCOCET/ROXICET) 5-325 MG per tablet Take 1-2 tablets by mouth every 6 (six) hours as needed for severe pain. 120 tablet 0 02/27/2015 at Unknown time  . pantoprazole (  PROTONIX) 40 MG tablet TAKE 1 TABLET ONCE DAILY. 30 tablet 2 02/27/2015 at Unknown time  . polyethylene glycol (MIRALAX / GLYCOLAX) packet Take 17 g by mouth daily.   02/27/2015 at Unknown time  . PRESCRIPTION MEDICATION Chemo at [facility]   Past Week at Unknown time  . prochlorperazine (COMPAZINE) 10 MG tablet Take 1 tablet (10 mg total) by mouth every 6 (six) hours as needed for nausea or vomiting. 30 tablet 1 unknown  . vitamin B-12 (CYANOCOBALAMIN) 1000 MCG tablet Take 1,000 mcg by mouth daily with breakfast.   Past Week at Unknown time  . cephALEXin (KEFLEX) 500 MG capsule Take  1 capsule by mouth 4 (four) times daily. 10 day course for wisdom tooth treatment  0 Completed Course at Unknown time  . testosterone enanthate (DELATESTRYL) 200 MG/ML injection Inject 300 mg into the muscle every 21 ( twenty-one) days. Every 3 weeks currently   02/22/2015    Assessment/Plan: 1 AKI slow improv,  Can d/c and f/u chem outpatient, have arranged..Discussed diet.  Meds.  2 Anemia 3 Colon Ca 4 ^ phos with stop binder P Ok to d/c,will s/o.  Labs arranged    LOS: 10 days   Brook Geraci,Pinchos L 03/10/2015,10:04 AM

## 2015-03-10 NOTE — Progress Notes (Signed)
Medicare Important Message given?  YES (If response is "NO", the following Medicare IM given date fields will be blank) Date Medicare IM given:  03/10/15 Medicare IM given by:  Tomi Bamberger

## 2015-03-10 NOTE — Progress Notes (Signed)
NURSING PROGRESS NOTE  [name] [id number] Discharge Data: 03/10/2015 1:50 PM Attending Provider: Debbe Odea, MD [id number],HBZJ A, MD   [name] to be D/C'd Home per MD order.    All IV's will be discontinued and monitored for bleeding.  All belongings will be returned to patient for patient to take home.  Last Documented Vital Signs:  Blood pressure 134/52, pulse 64, temperature 97.7 F (36.5 C), temperature source Oral, resp. rate 18, height 5\' 7"  (1.702 m), weight 59.285 kg (130 lb 11.2 oz), SpO2 97 %.  [name], MSN, RN, [employer]

## 2015-03-17 ENCOUNTER — Telehealth: Payer: Self-pay | Admitting: *Deleted

## 2015-03-17 ENCOUNTER — Telehealth: Payer: Self-pay | Admitting: Nurse Practitioner

## 2015-03-17 DIAGNOSIS — C189 Malignant neoplasm of colon, unspecified: Secondary | ICD-10-CM

## 2015-03-17 NOTE — Telephone Encounter (Signed)
Received phone call from patient asking if he needed labs before seeing Elby Showers. Marcello Moores, NP.  Spoke with Elby Showers. Thomas,NP.  Orders for CBC and Cmet entered and POF sent to scheduler.  Informed patient.

## 2015-03-17 NOTE — Telephone Encounter (Signed)
Patients wife aware of lab appointment 3/25

## 2015-03-18 ENCOUNTER — Ambulatory Visit (HOSPITAL_BASED_OUTPATIENT_CLINIC_OR_DEPARTMENT_OTHER): Payer: Medicare Other | Admitting: Nurse Practitioner

## 2015-03-18 ENCOUNTER — Telehealth: Payer: Self-pay | Admitting: Nurse Practitioner

## 2015-03-18 ENCOUNTER — Other Ambulatory Visit (HOSPITAL_BASED_OUTPATIENT_CLINIC_OR_DEPARTMENT_OTHER): Payer: Medicare Other

## 2015-03-18 VITALS — BP 107/58 | HR 80 | Temp 97.5°F | Resp 18 | Ht 67.0 in | Wt 127.5 lb

## 2015-03-18 DIAGNOSIS — C786 Secondary malignant neoplasm of retroperitoneum and peritoneum: Secondary | ICD-10-CM

## 2015-03-18 DIAGNOSIS — G620 Drug-induced polyneuropathy: Secondary | ICD-10-CM

## 2015-03-18 DIAGNOSIS — C185 Malignant neoplasm of splenic flexure: Secondary | ICD-10-CM

## 2015-03-18 DIAGNOSIS — N19 Unspecified kidney failure: Secondary | ICD-10-CM | POA: Diagnosis not present

## 2015-03-18 DIAGNOSIS — R109 Unspecified abdominal pain: Secondary | ICD-10-CM

## 2015-03-18 DIAGNOSIS — C787 Secondary malignant neoplasm of liver and intrahepatic bile duct: Secondary | ICD-10-CM

## 2015-03-18 DIAGNOSIS — C189 Malignant neoplasm of colon, unspecified: Secondary | ICD-10-CM

## 2015-03-18 DIAGNOSIS — R634 Abnormal weight loss: Secondary | ICD-10-CM

## 2015-03-18 LAB — CBC WITH DIFFERENTIAL/PLATELET
BASO%: 0.8 % (ref 0.0–2.0)
Basophils Absolute: 0.1 10*3/uL (ref 0.0–0.1)
EOS%: 2 % (ref 0.0–7.0)
Eosinophils Absolute: 0.2 10*3/uL (ref 0.0–0.5)
HCT: 32.6 % — ABNORMAL LOW (ref 38.4–49.9)
HGB: 10.3 g/dL — ABNORMAL LOW (ref 13.0–17.1)
LYMPH%: 11.9 % — ABNORMAL LOW (ref 14.0–49.0)
MCH: 25.4 pg — ABNORMAL LOW (ref 27.2–33.4)
MCHC: 31.6 g/dL — ABNORMAL LOW (ref 32.0–36.0)
MCV: 80.5 fL (ref 79.3–98.0)
MONO#: 1.3 10*3/uL — AB (ref 0.1–0.9)
MONO%: 17.1 % — AB (ref 0.0–14.0)
NEUT#: 5.1 10*3/uL (ref 1.5–6.5)
NEUT%: 68.2 % (ref 39.0–75.0)
Platelets: 252 10*3/uL (ref 140–400)
RBC: 4.05 10*6/uL — AB (ref 4.20–5.82)
RDW: 19.3 % — AB (ref 11.0–14.6)
WBC: 7.5 10*3/uL (ref 4.0–10.3)
lymph#: 0.9 10*3/uL (ref 0.9–3.3)

## 2015-03-18 LAB — COMPREHENSIVE METABOLIC PANEL (CC13)
ALBUMIN: 3.1 g/dL — AB (ref 3.5–5.0)
ALK PHOS: 121 U/L (ref 40–150)
ALT: 14 U/L (ref 0–55)
AST: 19 U/L (ref 5–34)
Anion Gap: 9 mEq/L (ref 3–11)
BILIRUBIN TOTAL: 0.44 mg/dL (ref 0.20–1.20)
BUN: 31.3 mg/dL — ABNORMAL HIGH (ref 7.0–26.0)
CO2: 26 mEq/L (ref 22–29)
Calcium: 9.9 mg/dL (ref 8.4–10.4)
Chloride: 105 mEq/L (ref 98–109)
Creatinine: 2.3 mg/dL — ABNORMAL HIGH (ref 0.7–1.3)
EGFR: 27 mL/min/{1.73_m2} — ABNORMAL LOW (ref >90)
Glucose: 120 mg/dl (ref 70–140)
Potassium: 4.5 mEq/L (ref 3.5–5.1)
SODIUM: 140 meq/L (ref 136–145)
Total Protein: 6.7 g/dL (ref 6.4–8.3)

## 2015-03-18 MED ORDER — ONDANSETRON HCL 8 MG PO TABS: 8.0000 mg | ORAL_TABLET | Freq: Two times a day (BID) | ORAL | Status: AC | PRN

## 2015-03-18 MED ORDER — OXYCODONE-ACETAMINOPHEN 5-325 MG PO TABS: 1.0000 | ORAL_TABLET | Freq: Four times a day (QID) | ORAL | Status: DC | PRN

## 2015-03-18 NOTE — Progress Notes (Addendum)
Early OFFICE PROGRESS NOTE   Diagnosis:  Colon cancer  INTERVAL HISTORY:   [name] returns for follow-up since discharge from the hospital. He continues to feel weak. Overall appetite is poor. He does report good fluid intake. He notes an alteration in taste. He has mild intermittent nausea. Overall abdominal pain is better. He occasionally notes an increase in the pain. Bowels are moving. No diarrhea. He denies fever.  He reports good urine output.  Objective:  Vital signs in last 24 hours:  Blood pressure 107/58, pulse 80, temperature 97.5 F (36.4 C), temperature source Oral, resp. rate 18, height _0  (1.702 m), weight 127 lb 8 oz (57.834 kg), SpO2 99 %.    HEENT: No thrush or ulcers. Resp: Lungs clear bilaterally. Cardio: Regular rate and rhythm. GI: Abdomen is soft. Nontender. Stable mass superior to the umbilicus. Vascular: No leg edema. Neuro: Alert and oriented.  Port-A-Cath without erythema.    Lab Results:  Lab Results  Component Value Date   WBC 7.5 03/18/2015   HGB 10.3* 03/18/2015   HCT 32.6* 03/18/2015   MCV 80.5 03/18/2015   PLT 252 03/18/2015   NEUTROABS 5.1 03/18/2015   Creatinine 2.3, BUN 31.3 Imaging:  No results found.  Medications: I have reviewed the patient's current medications.  Assessment/Plan: 1. 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  Initially diagnosed with stage III colon cancer July 2012 status post left colectomy 07/09/2011. Microsatellite stable   K-ras mutation not detected on initial testing. Extended testing returned negative for the K-ras mutation.   Adjuvant Xeloda initiated July 2012.   Rise in the CEA tumor marker and 2 intra-abdominal soft tissue densities on CT scan November 2012 while on adjuvant Xeloda chemotherapy.   Oxaliplatin and Avastin were added to the regimen beginning 11/26/2011 with a decrease in the CEA and continued through 04/23/2012. Oxaliplatin discontinued  at that time due to cumulative neurotoxicity.   Xeloda and Avastin continued.   Restaging CT evaluation 04/09/2013 showed an increase in the previously noted peritoneal implants.   FOLFIRI initiated 04/29/2013. Irinotecan dose reduced following cycle 1 due to mucositis.   Avastin added beginning with cycle 3.   Restaging CT evaluation 07/27/2013 (after 6 cycles) showed interval resolution of a previously described left lower lobe nodule. Interval improvement in peritoneal nodularity. He completed a total of 14 cycles through 11/23/2013. Treatment was adjusted to [facility] following an office visit 12/07/2013 due to a significant decline in his performance status.   Avastin placed on hold beginning 03/31/2014 due to increased urine protein. 24-hour urine on 04/02/2014 showed 814 mg of protein.   03/31/2014 CEA mildly increased at 20.3 as compared to 15.6 on 03/03/2014 and 10.4 on 02/03/2014.   Xeloda placed on hold beginning 04/14/2014 pending upcoming CT scans.   CT scan chest/abdomen/pelvis 04/29/2014 showed an enlarging stomach mass involving the anterior wall in the antral region measuring approximately 5 cm. The mass was narrowing the gastric lumen. Peritoneal implants in the left upper quadrant were noted to be slightly more enlarged.   Upper endoscopy 05/04/2014 with findings of a 5 x 5 cm mass in the gastric antrum. Biopsy showed metastatic adenocarcinoma.   Initiation of gastric radiation and concurrent Xeloda 05/25/2014; completed 06/14/2014.   Restaging CT scans chest/abdomen/pelvis 07/27/2014 with mild increase in the size of several small abdominal peritoneal metastases and mild decrease in size of the soft tissue mass involving the anterior wall of the gastric antrum. No new sites of metastatic disease  identified. No evidence of metastatic disease involving the chest. Moderate to severe emphysema.   Cycle 1 irinotecan/panitumumab 08/04/2014   Cycle 2  irinotecan/Panitumumab 08/18/2014.   Cycle 3 irinotecan/panitumumab 09/01/2014.   CEA improved 09/15/2014.   Treatment held 09/15/2014 due to diarrhea.   Cycle 4 irinotecan/Panitumumab 09/22/2014 (irinotecan dose reduced due to diarrhea).  Cycle 5 irinotecan/Panitumumab 10/06/2014.  Cycle 6 irinotecan/panitumumab 10/27/2014  Restaging CT 11/09/2014 with enlargement of a liver metastasis, increased gastric antral thickening, and slight progression of peritoneal metastases  CEA increased 11/09/2014  Cycle 1 FOLFOX/Avastin 01/12/2015  Cycle 2 FOLFOX 01/26/2015 (Avastin held due to an upcoming dental procedure)  Cycle 3 FOLFOX 02/09/2015 (Avastin held secondary to a planned dental procedure)  Cycle 4 FOLFOX 02/23/2015 (Avastin held due to a recent dental procedure) 2. Status post Port-A-Cath placement 04/27/2013. 3. Status post resection of a stage I non-small cell lung cancer left upper lung with postop brachytherapy June 2007. 4. 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 Radiographic complete regression on Avastin based chemotherapy. 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 6. Type 2 diabetes. 7. History of nephrolithiasis. 8. Status post bilateral corneal transplants. 9. History of Increased urine protein on Avastin. 10. Intermittent upper abdominal pain secondary to gastric mass and abdominal masses 11. Delayed nausea and diarrhea following cycle 1 irinotecan/panitumumab-the anti-emetic premedication was adjusted with cycle 2. Nausea improved with cycle 2. Persistent diarrhea. 12. Rash secondary to Panitumumab. Improved 13. Weight loss. 14. Diarrhea most likely related to irinotecan. Irinotecan dose reduced beginning with cycle 4 09/22/2014. 15. Right foot drop-potentially related to spinal stenosis versus  peripheral nerve compression in the setting of weight loss. He was scheduled to undergo nerve decompression. 16. 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 17. Hospitalization 02/28/2015 with nausea, dehydration and renal failure. He required dialysis. Nephrology suspected acute kidney injury from oxaliplatin and volume depletion. He was discharged 03/10/2015.   Disposition: [name] is slowly recovering from the recent hospitalization with renal failure which required dialysis temporarily. His renal function is better. His performance status remains poor. He understands he is not a candidate for chemotherapy in his current condition.  He is being followed by nephrology and has an appointment on 03/29/2015.  We scheduled a return visit here on 04/06/2015. He will contact the office in the interim with any problems.  Patient seen with Dr. Benay Spice. A new Percocet prescription was provided at today's visit.    Ned Card ANP/GNP-BC   03/18/2015  12:09 PM  This was a shared visit with Ned Card. His renal function is improving, but remains abnormal. He will remain off of chemotherapy.  Julieanne Manson, M.D.

## 2015-03-18 NOTE — Telephone Encounter (Signed)
Gave avs & calendar for April. °

## 2015-03-25 ENCOUNTER — Telehealth: Payer: Self-pay | Admitting: Gastroenterology

## 2015-03-25 ENCOUNTER — Other Ambulatory Visit: Payer: Self-pay

## 2015-03-25 DIAGNOSIS — K311 Adult hypertrophic pyloric stenosis: Secondary | ICD-10-CM

## 2015-03-25 NOTE — Telephone Encounter (Signed)
Please see if they can do the upper GI series tomorrow.  If so, I asked him to call me with the results on my cell.  We are trying to keep him out of the hospital for a gastric outlet obstruction.

## 2015-03-25 NOTE — Telephone Encounter (Signed)
He has not eaten any solids. He has had liquids only today. Unsuccessful in getting him worked in for UGI today. Appointment is for Monday. Reviewed liquid diet and after hours emergency care.

## 2015-03-25 NOTE — Telephone Encounter (Signed)
Spoke with the patient. He states he has started burping again and it has that bitter taste to it as before. It just started this week, but he is concerned. He is not available until this afternoon. Please advised.

## 2015-03-25 NOTE — Telephone Encounter (Signed)
He has to go through the ER with a written order and go to [facility]. I will call the patient to come pick up a written order.

## 2015-03-25 NOTE — Telephone Encounter (Signed)
Pt said the symptoms he was having earlier are not as bad as they were previously

## 2015-03-25 NOTE — Telephone Encounter (Signed)
He needs a repeat upper GI series ASAP

## 2015-03-26 ENCOUNTER — Ambulatory Visit (HOSPITAL_COMMUNITY)
Admission: EM | Admit: 2015-03-26 | Discharge: 2015-03-26 | Disposition: A | Discharge status: Home / Self Care | Payer: Medicare Other | Source: Ambulatory Visit | Attending: Gastroenterology | Admitting: Gastroenterology

## 2015-03-26 ENCOUNTER — Ambulatory Visit (HOSPITAL_COMMUNITY)
Admission: RE | Admit: 2015-03-26 | Discharge: 2015-03-26 | Disposition: A | Discharge status: Home / Self Care | Payer: Medicare Other | Source: Ambulatory Visit | Attending: Gastroenterology | Admitting: Gastroenterology

## 2015-03-26 DIAGNOSIS — K311 Adult hypertrophic pyloric stenosis: Secondary | ICD-10-CM | POA: Insufficient documentation

## 2015-03-26 DIAGNOSIS — C169 Malignant neoplasm of stomach, unspecified: Secondary | ICD-10-CM | POA: Diagnosis not present

## 2015-03-28 ENCOUNTER — Encounter (HOSPITAL_COMMUNITY): Payer: Self-pay | Admitting: *Deleted

## 2015-03-28 ENCOUNTER — Other Ambulatory Visit: Payer: Self-pay

## 2015-03-28 ENCOUNTER — Other Ambulatory Visit: Payer: Self-pay | Admitting: *Deleted

## 2015-03-28 ENCOUNTER — Ambulatory Visit (HOSPITAL_COMMUNITY): Payer: Medicare Other

## 2015-03-28 ENCOUNTER — Telehealth: Payer: Self-pay | Admitting: Oncology

## 2015-03-28 DIAGNOSIS — K311 Adult hypertrophic pyloric stenosis: Secondary | ICD-10-CM

## 2015-03-28 NOTE — Telephone Encounter (Signed)
S/w pt confirming labs/ov per 04/04 POF, mailed sch to pt... KJ

## 2015-03-31 ENCOUNTER — Encounter (HOSPITAL_COMMUNITY): Payer: Self-pay | Admitting: *Deleted

## 2015-03-31 ENCOUNTER — Encounter (HOSPITAL_COMMUNITY)
Admission: RE | Disposition: A | Discharge status: Home / Self Care | Payer: Self-pay | Source: Ambulatory Visit | Attending: Gastroenterology

## 2015-03-31 ENCOUNTER — Ambulatory Visit (HOSPITAL_COMMUNITY): Payer: Medicare Other

## 2015-03-31 ENCOUNTER — Ambulatory Visit (HOSPITAL_COMMUNITY): Payer: Medicare Other | Admitting: Anesthesiology

## 2015-03-31 ENCOUNTER — Ambulatory Visit (HOSPITAL_COMMUNITY)
Admission: RE | Admit: 2015-03-31 | Discharge: 2015-03-31 | Disposition: A | Discharge status: Home / Self Care | Payer: Medicare Other | Source: Ambulatory Visit | Attending: Gastroenterology | Admitting: Gastroenterology

## 2015-03-31 ENCOUNTER — Ambulatory Visit (HOSPITAL_COMMUNITY): Admit: 2015-03-31 | Payer: Self-pay | Admitting: Gastroenterology

## 2015-03-31 DIAGNOSIS — Z85118 Personal history of other malignant neoplasm of bronchus and lung: Secondary | ICD-10-CM | POA: Diagnosis not present

## 2015-03-31 DIAGNOSIS — Z931 Gastrostomy status: Secondary | ICD-10-CM | POA: Insufficient documentation

## 2015-03-31 DIAGNOSIS — Z923 Personal history of irradiation: Secondary | ICD-10-CM | POA: Insufficient documentation

## 2015-03-31 DIAGNOSIS — J449 Chronic obstructive pulmonary disease, unspecified: Secondary | ICD-10-CM | POA: Insufficient documentation

## 2015-03-31 DIAGNOSIS — H409 Unspecified glaucoma: Secondary | ICD-10-CM | POA: Insufficient documentation

## 2015-03-31 DIAGNOSIS — E119 Type 2 diabetes mellitus without complications: Secondary | ICD-10-CM | POA: Diagnosis not present

## 2015-03-31 DIAGNOSIS — Z85038 Personal history of other malignant neoplasm of large intestine: Secondary | ICD-10-CM | POA: Insufficient documentation

## 2015-03-31 DIAGNOSIS — Z9221 Personal history of antineoplastic chemotherapy: Secondary | ICD-10-CM | POA: Insufficient documentation

## 2015-03-31 DIAGNOSIS — E785 Hyperlipidemia, unspecified: Secondary | ICD-10-CM | POA: Diagnosis not present

## 2015-03-31 DIAGNOSIS — H9190 Unspecified hearing loss, unspecified ear: Secondary | ICD-10-CM | POA: Diagnosis not present

## 2015-03-31 DIAGNOSIS — Z87891 Personal history of nicotine dependence: Secondary | ICD-10-CM | POA: Diagnosis not present

## 2015-03-31 DIAGNOSIS — K311 Adult hypertrophic pyloric stenosis: Secondary | ICD-10-CM | POA: Insufficient documentation

## 2015-03-31 DIAGNOSIS — C7889 Secondary malignant neoplasm of other digestive organs: Secondary | ICD-10-CM | POA: Insufficient documentation

## 2015-03-31 HISTORY — PX: DUODENAL STENT PLACEMENT: SHX5541

## 2015-03-31 HISTORY — PX: ESOPHAGOGASTRODUODENOSCOPY (EGD) WITH PROPOFOL: SHX5813

## 2015-03-31 SURGERY — ESOPHAGOGASTRODUODENOSCOPY (EGD) WITH PROPOFOL
Anesthesia: Monitor Anesthesia Care

## 2015-03-31 SURGERY — EGD (ESOPHAGOGASTRODUODENOSCOPY)
Anesthesia: Monitor Anesthesia Care

## 2015-03-31 MED ORDER — LACTATED RINGERS IV SOLN
INTRAVENOUS | Status: DC | PRN
  Administered 2015-03-31: 11:00:00 via INTRAVENOUS

## 2015-03-31 MED ORDER — LACTATED RINGERS IV SOLN
INTRAVENOUS | Status: DC
  Administered 2015-03-31: 1000 mL via INTRAVENOUS

## 2015-03-31 MED ORDER — FENTANYL CITRATE 0.05 MG/ML IJ SOLN: 25.0000 ug | INTRAMUSCULAR | Status: DC | PRN

## 2015-03-31 MED ORDER — PROPOFOL 10 MG/ML IV BOLUS
INTRAVENOUS | Status: AC
  Filled 2015-03-31: qty 20

## 2015-03-31 MED ORDER — LACTATED RINGERS IV SOLN: INTRAVENOUS | Status: DC

## 2015-03-31 MED ORDER — SODIUM CHLORIDE 0.9 % IV SOLN: INTRAVENOUS | Status: DC

## 2015-03-31 MED ORDER — IOHEXOL 300 MG/ML  SOLN
150.0000 mL | Freq: Once | INTRAMUSCULAR | Status: AC | PRN
  Administered 2015-03-31: 140 mL via ORAL

## 2015-03-31 MED ORDER — PROPOFOL 10 MG/ML IV BOLUS
INTRAVENOUS | Status: DC | PRN
  Administered 2015-03-31: 25 mg via INTRAVENOUS
  Administered 2015-03-31 (×3): 50 mg via INTRAVENOUS

## 2015-03-31 SURGICAL SUPPLY — 15 items

## 2015-03-31 NOTE — Anesthesia Preprocedure Evaluation (Signed)
Anesthesia Evaluation  Patient identified by MRN, date of birth, ID band Patient awake    Reviewed: Allergy & Precautions, H&P , NPO status , Patient's Chart, lab work & pertinent test results  Airway Mallampati: II  TM Distance: >3 FB Neck ROM: full    Dental no notable dental hx. (+) Dental Advisory Given, Teeth Intact   Pulmonary shortness of breath and with exertion, COPDformer smoker,  Severe COPD breath sounds clear to auscultation  Pulmonary exam normal       Cardiovascular Exercise Tolerance: Good negative cardio ROS  Rhythm:regular Rate:Normal     Neuro/Psych glaucoma negative neurological ROS  negative psych ROS   GI/Hepatic negative GI ROS, Neg liver ROS, Colon cancer with mets. Gastric/ bowel obstruction with NG tube in   Endo/Other  diabetes, Well Controlled, Type 2Diet controlled DM  Renal/GU negative Renal ROS  negative genitourinary   Musculoskeletal   Abdominal   Peds  Hematology negative hematology ROS (+)   Anesthesia Other Findings   Reproductive/Obstetrics negative OB ROS                             Anesthesia Physical Anesthesia Plan  ASA: III  Anesthesia Plan: MAC   Post-op Pain Management:    Induction:   Airway Management Planned:   Additional Equipment:   Intra-op Plan:   Post-operative Plan:   Informed Consent:   Plan Discussed with: Surgeon  Anesthesia Plan Comments:         Anesthesia Quick Evaluation

## 2015-03-31 NOTE — Transfer of Care (Signed)
Immediate Anesthesia Transfer of Care Note  Patient: [name]  Procedure(s) Performed: Procedure(s): ESOPHAGOGASTRODUODENOSCOPY (EGD) WITH PROPOFOL (N/A) DUODENAL STENT PLACEMENT (N/A)  Patient Location: PACU and Endoscopy Unit  Anesthesia Type:MAC  Level of Consciousness: awake, sedated and patient cooperative  Airway & Oxygen Therapy: Patient Spontanous Breathing and Patient connected to nasal cannula oxygen  Post-op Assessment: Report given to RN and Post -op Vital signs reviewed and stable  Post vital signs: Reviewed and stable  Last Vitals:  Filed Vitals:   03/31/15 1117  BP: 117/62  Temp: 62.9 C    Complications: No apparent anesthesia complications

## 2015-03-31 NOTE — H&P (Signed)
_                                                                                                                History of Present Illness:  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  UGI series demonstrated tumor growing into stents and possibly obstructing the most proximal portion.    Past Medical History  Diagnosis Date  . COPD (chronic obstructive pulmonary disease)   . Diverticulosis   . Hx of adenomatous colonic polyps 04/30/07  . Glaucoma   . Hyperlipidemia   . ED (erectile dysfunction)   . Lung nodule   . Hepatic steatosis   . Thoracic spondylosis   . SOB (shortness of breath)   . Bruises easily   . Hearing loss     slight  . Contact lens/glasses fitting   . Rectal bleeding   . Hemorrhoids   . Glaucoma   . 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 Anemia   . History of radiation therapy 05/25/14-06/14/14    gastric mets 37.5Gy/  . Non-small cell lung cancer   . Colon cancer   . 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 Diabetes mellitus     type II  . Nephrolithiasis     Dr. Deterdingfollowing kidney function- 02-28-15( [facility] x2 dialysis tx), no longer doing dialysis.   Past Surgical History  Procedure Laterality Date  . 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 Lung removal, partial  05/2006    Lt upper lobe wedge resection  . Tonsillectomy    . Kidney stone removal  07/2006  . Colonoscopy    . Polypectomy    . Colon surgery  06/2011  . 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 Trigger finger release  08/29/2012    Procedure: RELEASE TRIGGER FINGER/A-1 PULLEY;  Surgeon: Cammie Sickle., MD;  Location: [facility];  Service: Orthopedics;  Laterality: Left;  release a1 pulley left long  . Radioactive seed implantation Bilateral  05/28/2006    40seeds, lung left upper   . Superficial peroneal nerve release    . Mouth surgery    . Esophagogastroduodenoscopy (egd) with propofol N/A 01/04/2015    Procedure: ESOPHAGOGASTRODUODENOSCOPY (EGD) WITH PROPOFOL with gastric stent;  Surgeon: Inda Castle, MD;  Location: [facility];  Service: Endoscopy;  Laterality: N/A;  . Duodenal stent placement N/A 01/04/2015    Procedure: DUODENAL STENT PLACEMENT;  Surgeon: Inda Castle, MD;  Location: [facility];  Service: Endoscopy;  Laterality: N/A;  . Esophagogastroduodenoscopy N/A 01/06/2015    Procedure: ESOPHAGOGASTRODUODENOSCOPY (EGD)  with gastric/duodenal stent;  Surgeon: Inda Castle, MD;  Location: [facility];  Service: Endoscopy;  Laterality: N/A;  . Duodenal  stent placement N/A 01/06/2015    Procedure: DUODENAL STENT PLACEMENT;  Surgeon: Inda Castle, MD;  Location: [facility];  Service: Endoscopy;  Laterality: N/A;   family history includes Asthma in his cousin and another family member; Cancer in his daughter and paternal aunt; Emphysema in his father; Esophageal cancer in his mother; Leukemia in his father; Lung cancer in his father; Ulcers in his brother. Current Facility-Administered Medications  Medication Dose Route Frequency Provider Last Rate Last Dose  . 0.9 %  sodium chloride infusion   Intravenous Continuous Inda Castle, MD      . lactated ringers infusion   Intravenous Continuous Inda Castle, MD 125 mL/hr at 03/31/15 1130 1,000 mL at 03/31/15 1130   Allergies as of 03/28/2015  . (No Known Allergies)    reports that he quit smoking about 34 years ago. His smoking use included Cigarettes. He has a 50 pack-year smoking history. He has never used smokeless tobacco. He reports that he drinks about 0.6 oz of alcohol per week. He reports that he does not use illicit drugs.   Review of Systems: Pertinent positive and negative review of systems were noted in the above HPI section. All other review of  systems were otherwise negative.  Vital signs were reviewed in today's medical record Physical Exam: General: Chronically ill appearing in no acute distress Skin: anicteric Head: Normocephalic and atraumatic Eyes:  sclerae anicteric, EOMI Ears: Normal auditory acuity Mouth: No deformity or lesions Neck: Supple, no masses or thyromegaly Lungs: Clear throughout to auscultation Heart: Regular rate and rhythm; no murmurs, rubs or bruits Abdomen: Soft, non tender and non distended. No masses, hepatosplenomegaly or hernias noted. Normal Bowel sounds.  Palpable mass in umbilical area Rectal:deferred Musculoskeletal: Symmetrical with no gross deformities  Skin: No lesions on visible extremities Pulses:  Normal pulses noted Extremities: No clubbing, cyanosis, edema or deformities noted Neurological: Alert oriented x 4, grossly nonfocal Cervical Nodes:  No significant cervical adenopathy Inguinal Nodes: No significant inguinal adenopathy Psychological:  Alert and cooperative. Normal mood and affect  Impression - Partial gastric outlet obstruction secondary to metastatic tumor  Plan - EGD

## 2015-03-31 NOTE — Anesthesia Postprocedure Evaluation (Signed)
  Anesthesia Post-op Note  Patient: [name]  Procedure(s) Performed: Procedure(s) (LRB): ESOPHAGOGASTRODUODENOSCOPY (EGD) WITH PROPOFOL (N/A) DUODENAL STENT PLACEMENT (N/A)  Patient Location: PACU  Anesthesia Type: MAC  Level of Consciousness: awake and alert   Airway and Oxygen Therapy: Patient Spontanous Breathing  Post-op Pain: mild  Post-op Assessment: Post-op Vital signs reviewed, Patient's Cardiovascular Status Stable, Respiratory Function Stable, Patent Airway and No signs of Nausea or vomiting  Last Vitals:  Filed Vitals:   03/31/15 1325  BP:   Pulse: 75  Temp:   Resp: 14    Post-op Vital Signs: stable   Complications: No apparent anesthesia complications

## 2015-03-31 NOTE — Discharge Instructions (Signed)
Esophagogastroduodenoscopy Care After Refer to this sheet in the next few weeks. These instructions provide you with information on caring for yourself after your procedure. Your caregiver may also give you more specific instructions. Your treatment has been planned according to current medical practices, but problems sometimes occur. Call your caregiver if you have any problems or questions after your procedure.  HOME CARE INSTRUCTIONS  Do not eat or drink anything until the numbing medicine (local anesthetic) has worn off and your gag reflex has returned. You will know that the local anesthetic has worn off when you can swallow comfortably.  Do not drive for 12 hours after the procedure or as directed by your caregiver.  Only take medicines as directed by your caregiver. SEEK MEDICAL CARE IF:   You cannot stop coughing.  You are not urinating at all or less than usual. SEEK IMMEDIATE MEDICAL CARE IF:  You have difficulty swallowing.  You cannot eat or drink.  You have worsening throat or chest pain.  You have dizziness, lightheadedness, or you faint.  You have nausea or vomiting.  You have chills.  You have a fever.  You have severe abdominal pain.  You have black, tarry, or bloody stools. Document Released: 11/26/2012 Document Reviewed: 11/26/2012 [facility] Patient Information 2015 [place]. This information is not intended to replace advice given to you by your health care provider. Make sure you discuss any questions you have with your health care provider.   Conscious Sedation, Adult, Care After Refer to this sheet in the next few weeks. These instructions provide you with information on caring for yourself after your procedure. Your health care provider may also give you more specific instructions. Your treatment has been planned according to current medical practices, but problems sometimes occur. Call your health care provider if you have any problems or  questions after your procedure. WHAT TO EXPECT AFTER THE PROCEDURE  After your procedure:  You may feel sleepy, clumsy, and have poor balance for several hours.  Vomiting may occur if you eat too soon after the procedure. HOME CARE INSTRUCTIONS  Do not participate in any activities where you could become injured for at least 24 hours. Do not:  Drive.  Swim.  Ride a bicycle.  Operate heavy machinery.  Cook.  Use power tools.  Climb ladders.  Work from a high place.  Do not make important decisions or sign legal documents until you are improved.  If you vomit, drink water, juice, or soup when you can drink without vomiting. Make sure you have little or no nausea before eating solid foods.  Only take over-the-counter or prescription medicines for pain, discomfort, or fever as directed by your health care provider.  Make sure you and your family fully understand everything about the medicines given to you, including what side effects may occur.  You should not drink alcohol, take sleeping pills, or take medicines that cause drowsiness for at least 24 hours.  If you smoke, do not smoke without supervision.  If you are feeling better, you may resume normal activities 24 hours after you were sedated.  Keep all appointments with your health care provider. SEEK MEDICAL CARE IF:  Your skin is pale or bluish in color.  You continue to feel nauseous or vomit.  Your pain is getting worse and is not helped by medicine.  You have bleeding or swelling.  You are still sleepy or feeling clumsy after 24 hours. SEEK IMMEDIATE MEDICAL CARE IF:  You develop a  rash.  You have difficulty breathing.  You develop any type of allergic problem.  You have a fever. MAKE SURE YOU:  Understand these instructions.  Will watch your condition.  Will get help right away if you are not doing well or get worse. Document Released: 09/30/2013 Document Reviewed: 09/30/2013 [facility]  Patient Information 2015 [place], [place]. This information is not intended to replace advice given to you by your health care provider. Make sure you discuss any questions you have with your health care provider.

## 2015-04-01 ENCOUNTER — Telehealth: Payer: Self-pay | Admitting: *Deleted

## 2015-04-01 NOTE — Telephone Encounter (Signed)
TC from patient. He is requesting a copy of upcoming lab report (04/08/15) to be fax'd to Dr. Deterding's office.  Notified lab of request and instructed pt. To remind phlebotomist at the time lab draw.  Pt. voiced understanding.

## 2015-04-04 ENCOUNTER — Encounter (HOSPITAL_COMMUNITY): Payer: Self-pay | Admitting: Gastroenterology

## 2015-04-06 ENCOUNTER — Ambulatory Visit: Payer: Medicare Other | Admitting: Oncology

## 2015-04-06 ENCOUNTER — Other Ambulatory Visit: Payer: Medicare Other

## 2015-04-08 ENCOUNTER — Telehealth: Payer: Self-pay | Admitting: *Deleted

## 2015-04-08 ENCOUNTER — Other Ambulatory Visit (HOSPITAL_COMMUNITY)
Admission: RE | Admit: 2015-04-08 | Discharge: 2015-04-08 | Disposition: A | Discharge status: Home / Self Care | Payer: Medicare Other | Source: Ambulatory Visit | Attending: General Surgery | Admitting: General Surgery

## 2015-04-08 ENCOUNTER — Ambulatory Visit: Payer: Medicare Other | Admitting: Oncology

## 2015-04-08 ENCOUNTER — Ambulatory Visit (HOSPITAL_BASED_OUTPATIENT_CLINIC_OR_DEPARTMENT_OTHER): Payer: Medicare Other | Admitting: Oncology

## 2015-04-08 ENCOUNTER — Other Ambulatory Visit (HOSPITAL_BASED_OUTPATIENT_CLINIC_OR_DEPARTMENT_OTHER): Payer: Medicare Other

## 2015-04-08 ENCOUNTER — Other Ambulatory Visit: Payer: Medicare Other

## 2015-04-08 ENCOUNTER — Telehealth: Payer: Self-pay | Admitting: Oncology

## 2015-04-08 VITALS — BP 108/54 | HR 99 | Temp 97.7°F | Resp 18 | Ht 67.0 in | Wt 128.6 lb

## 2015-04-08 DIAGNOSIS — N179 Acute kidney failure, unspecified: Secondary | ICD-10-CM | POA: Diagnosis not present

## 2015-04-08 DIAGNOSIS — R109 Unspecified abdominal pain: Secondary | ICD-10-CM

## 2015-04-08 DIAGNOSIS — G620 Drug-induced polyneuropathy: Secondary | ICD-10-CM

## 2015-04-08 DIAGNOSIS — Z87891 Personal history of nicotine dependence: Secondary | ICD-10-CM | POA: Insufficient documentation

## 2015-04-08 DIAGNOSIS — R197 Diarrhea, unspecified: Secondary | ICD-10-CM

## 2015-04-08 DIAGNOSIS — C189 Malignant neoplasm of colon, unspecified: Secondary | ICD-10-CM

## 2015-04-08 DIAGNOSIS — C786 Secondary malignant neoplasm of retroperitoneum and peritoneum: Secondary | ICD-10-CM

## 2015-04-08 DIAGNOSIS — R21 Rash and other nonspecific skin eruption: Secondary | ICD-10-CM

## 2015-04-08 DIAGNOSIS — N200 Calculus of kidney: Secondary | ICD-10-CM

## 2015-04-08 DIAGNOSIS — C787 Secondary malignant neoplasm of liver and intrahepatic bile duct: Secondary | ICD-10-CM | POA: Diagnosis not present

## 2015-04-08 DIAGNOSIS — C185 Malignant neoplasm of splenic flexure: Secondary | ICD-10-CM

## 2015-04-08 DIAGNOSIS — C3490 Malignant neoplasm of unspecified part of unspecified bronchus or lung: Secondary | ICD-10-CM | POA: Diagnosis present

## 2015-04-08 LAB — COMPREHENSIVE METABOLIC PANEL
ALBUMIN: 3.2 g/dL — AB (ref 3.5–5.2)
ALT: 9 U/L (ref 0–53)
AST: 14 U/L (ref 0–37)
Alkaline Phosphatase: 109 U/L (ref 39–117)
Anion gap: 7 (ref 5–15)
BUN: 19 mg/dL (ref 6–23)
CALCIUM: 9.6 mg/dL (ref 8.4–10.5)
CO2: 26 mmol/L (ref 19–32)
Chloride: 107 mmol/L (ref 96–112)
Creatinine, Ser: 1.39 mg/dL — ABNORMAL HIGH (ref 0.50–1.35)
GFR calc Af Amer: 56 mL/min — ABNORMAL LOW (ref >90)
GFR, EST NON AFRICAN AMERICAN: 48 mL/min — AB (ref >90)
GLUCOSE: 154 mg/dL — AB (ref 70–99)
POTASSIUM: 4.3 mmol/L (ref 3.5–5.1)
Sodium: 140 mmol/L (ref 135–145)
TOTAL PROTEIN: 6.7 g/dL (ref 6.0–8.3)
Total Bilirubin: 0.3 mg/dL (ref 0.3–1.2)

## 2015-04-08 LAB — CBC WITH DIFFERENTIAL/PLATELET
BASO%: 0.8 % (ref 0.0–2.0)
Basophils Absolute: 0.1 10*3/uL (ref 0.0–0.1)
EOS%: 10.7 % — AB (ref 0.0–7.0)
Eosinophils Absolute: 1.1 10*3/uL — ABNORMAL HIGH (ref 0.0–0.5)
HCT: 28.9 % — ABNORMAL LOW (ref 38.4–49.9)
HGB: 9.3 g/dL — ABNORMAL LOW (ref 13.0–17.1)
LYMPH#: 0.6 10*3/uL — AB (ref 0.9–3.3)
LYMPH%: 5.9 % — AB (ref 14.0–49.0)
MCH: 26 pg — AB (ref 27.2–33.4)
MCHC: 32.2 g/dL (ref 32.0–36.0)
MCV: 80.7 fL (ref 79.3–98.0)
MONO#: 0.8 10*3/uL (ref 0.1–0.9)
MONO%: 7.9 % (ref 0.0–14.0)
NEUT#: 7.7 10*3/uL — ABNORMAL HIGH (ref 1.5–6.5)
NEUT%: 74.7 % (ref 39.0–75.0)
PLATELETS: 229 10*3/uL (ref 140–400)
RBC: 3.58 10*6/uL — ABNORMAL LOW (ref 4.20–5.82)
RDW: 23.9 % — ABNORMAL HIGH (ref 11.0–14.6)
WBC: 10.3 10*3/uL (ref 4.0–10.3)

## 2015-04-08 LAB — CEA: CEA: 170.8 ng/mL — ABNORMAL HIGH (ref 0.0–5.0)

## 2015-04-08 MED ORDER — OXYCODONE-ACETAMINOPHEN 5-325 MG PO TABS
1.0000 | ORAL_TABLET | Freq: Four times a day (QID) | ORAL | Status: DC | PRN
Start: 2015-04-08 — End: 2015-04-27

## 2015-04-08 NOTE — Telephone Encounter (Signed)
Appointments made and avs printed for patient °

## 2015-04-08 NOTE — Progress Notes (Signed)
[facility] OFFICE PROGRESS NOTE   Diagnosis: Colon cancer  INTERVAL HISTORY:   [name] returns as scheduled. He had increased difficulty with eating and an upper GI study suggested tumor overgrowth at the proximal stent. He reports undergoing placement of a third stent by Dr. Deatra Ina (this note is not available today).  [name] reports increased abdominal pain, partially relieved with Percocet. He has been taking 1 Percocet.  His bowels are moving. He is being followed by nephrology for the renal failure.  Objective:  Vital signs in last 24 hours:  Blood pressure 108/54, pulse 99, temperature 97.7 F (36.5 C), temperature source Oral, resp. rate 18, height _0  (1.702 m), weight 128 lb 9.6 oz (58.333 kg), SpO2 100 %.    HEENT: No thrush or ulcers Resp: Lungs clear bilaterally Cardio: Regular rate and rhythm GI: No hepatomegaly, firm mass superior to the umbilicus Vascular: Trace low leg/ankle edema on the left greater than right  Skin: Dryness over the back   Portacath/PICC-without erythema  Lab Results:  Lab Results  Component Value Date   WBC 10.3 04/08/2015   HGB 9.3* 04/08/2015   HCT 28.9* 04/08/2015   MCV 80.7 04/08/2015   PLT 229 04/08/2015   NEUTROABS 7.7* 04/08/2015   creatinine pending  Creatinine on 03/18/2015-2.3    Medications: I have reviewed the patient's current medications.  Assessment/Plan: 1. 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  Initially diagnosed with stage III colon cancer July 2012 status post left colectomy 07/09/2011. Microsatellite stable   K-ras mutation not detected on initial testing. Extended testing returned negative for the K-ras mutation.   Adjuvant Xeloda initiated July 2012.   Rise in the CEA tumor marker and 2 intra-abdominal soft tissue densities on CT scan November 2012 while on adjuvant Xeloda chemotherapy.   Oxaliplatin and Avastin were added to the regimen beginning 11/26/2011  with a decrease in the CEA and continued through 04/23/2012. Oxaliplatin discontinued at that time due to cumulative neurotoxicity.   Xeloda and Avastin continued.   Restaging CT evaluation 04/09/2013 showed an increase in the previously noted peritoneal implants.   FOLFIRI initiated 04/29/2013. Irinotecan dose reduced following cycle 1 due to mucositis.   Avastin added beginning with cycle 3.   Restaging CT evaluation 07/27/2013 (after 6 cycles) showed interval resolution of a previously described left lower lobe nodule. Interval improvement in peritoneal nodularity. He completed a total of 14 cycles through 11/23/2013. Treatment was adjusted to [facility] following an office visit 12/07/2013 due to a significant decline in his performance status.   Avastin placed on hold beginning 03/31/2014 due to increased urine protein. 24-hour urine on 04/02/2014 showed 814 mg of protein.   03/31/2014 CEA mildly increased at 20.3 as compared to 15.6 on 03/03/2014 and 10.4 on 02/03/2014.   Xeloda placed on hold beginning 04/14/2014 pending upcoming CT scans.   CT scan chest/abdomen/pelvis 04/29/2014 showed an enlarging stomach mass involving the anterior wall in the antral region measuring approximately 5 cm. The mass was narrowing the gastric lumen. Peritoneal implants in the left upper quadrant were noted to be slightly more enlarged.   Upper endoscopy 05/04/2014 with findings of a 5 x 5 cm mass in the gastric antrum. Biopsy showed metastatic adenocarcinoma.   Initiation of gastric radiation and concurrent Xeloda 05/25/2014; completed 06/14/2014.   Restaging CT scans chest/abdomen/pelvis 07/27/2014 with mild increase in the size of several small abdominal peritoneal metastases and mild decrease in size of the soft tissue mass involving the  anterior wall of the gastric antrum. No new sites of metastatic disease identified. No evidence of metastatic disease involving the chest. Moderate  to severe emphysema.   Cycle 1 irinotecan/panitumumab 08/04/2014   Cycle 2 irinotecan/Panitumumab 08/18/2014.   Cycle 3 irinotecan/panitumumab 09/01/2014.   CEA improved 09/15/2014.   Treatment held 09/15/2014 due to diarrhea.   Cycle 4 irinotecan/Panitumumab 09/22/2014 (irinotecan dose reduced due to diarrhea).  Cycle 5 irinotecan/Panitumumab 10/06/2014.  Cycle 6 irinotecan/panitumumab 10/27/2014  Restaging CT 11/09/2014 with enlargement of a liver metastasis, increased gastric antral thickening, and slight progression of peritoneal metastases  CEA increased 11/09/2014  Cycle 1 FOLFOX/Avastin 01/12/2015  Cycle 2 FOLFOX 01/26/2015 (Avastin held due to an upcoming dental procedure)  Cycle 3 FOLFOX 02/09/2015 (Avastin held secondary to a planned dental procedure)  Cycle 4 FOLFOX 02/23/2015 (Avastin held due to a recent dental procedure) 2. Status post Port-A-Cath placement 04/27/2013. 3. Status post resection of a stage I non-small cell lung cancer left upper lung with postop brachytherapy June 2007. 4. 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 Radiographic complete regression on Avastin based chemotherapy. 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 6. Type 2 diabetes. 7. History of nephrolithiasis. 8. Status post bilateral corneal transplants. 9. History of Increased urine protein on Avastin. 10. Intermittent upper abdominal pain secondary to gastric mass and abdominal masses 11. Delayed nausea and diarrhea following cycle 1 irinotecan/panitumumab-the anti-emetic premedication was adjusted with cycle 2. Nausea improved with cycle 2. Persistent diarrhea. 12. Rash secondary to Panitumumab. Improved 13. Weight loss. 14. Diarrhea most likely related to irinotecan. Irinotecan dose reduced beginning with cycle 4  09/22/2014. 15. Right foot drop-potentially related to spinal stenosis versus peripheral nerve compression in the setting of weight loss. He was scheduled to undergo nerve decompression. 16. 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 Placement of a duodenal stent 03/31/2015 17. Hospitalization 02/28/2015 with nausea, dehydration and renal failure. He required dialysis. Nephrology suspected acute kidney injury from oxaliplatin and volume depletion. He was discharged 03/10/2015. 18.    Disposition:  Mr. Massi is recovering from the admission with renal failure. The chemistry panel is pending today. We discussed treatment options. I am reluctant to prescribe another cycle of FOLFOX given the acute renal failure following cycle 4 FOLFOX and the lack of clear benefit. Treatment options are limited. I gave him reading information on Lonsurf.  He will contact us if the Percocet does not relieve his pain. He can take 2 Percocets at a time.  Mr. Tsou return for an office visit, Port-A-Cath flush, and labs on 04/27/2015. We will discuss beginning a trial of Lonsurf at that visit.   Betsy Coder, MD  04/08/2015  11:21 AM

## 2015-04-08 NOTE — Telephone Encounter (Signed)
Left message with pt's wife: creatinine is better, per Dr. Benay Spice. She voiced understanding.

## 2015-04-08 NOTE — Telephone Encounter (Signed)
-----   Message from Ladell Pier, MD sent at 04/08/2015  5:29 PM EDT ----- Please call patient, creatinine is better

## 2015-04-13 NOTE — Op Note (Addendum)
[facility] [place], [id number]   ENDOSCOPY PROCEDURE REPORT  PATIENT: [name], [name]  MR#: [id number] BIRTHDATE: 1941/10/30 , 74  yrs. old GENDER: male ENDOSCOPIST: Inda Castle, MD REFERRED BY:  Arturo Morton, M.D. PROCEDURE DATE:  03/31/2015 PROCEDURE:  EGD w/ transendoscopic intraluminal tube or catheter placement ASA CLASS:     Class III INDICATIONS:  therapeutic procedure and history of gastric obstruction secondary to metastatic colon cancer, status post previous stent placements, now with recurrent obstruction. MEDICATIONS: Monitored anesthesia care TOPICAL ANESTHETIC:  DESCRIPTION OF PROCEDURE: After the risks benefits and alternatives of the procedure were thoroughly explained, informed consent was obtained.  The    endoscope was introduced through the mouth and advanced to the second portion of the duodenum , Without limitations.  The instrument was slowly withdrawn as the mucosa was fully examined.    There was food debris at the most proximal portion of the stent that extended into the gastric antrum.  Stable cirrhotic by tumor.  The 9 mm gastroscope easily passed into through the stent was partially obstructed at the most distal portion in the second portion the duodenum.  With mild force it was traversable.under direct vision and fluoroscopic guidance a #22 mm x 90 meter uncovered duodenal stent was deployed.  The distal portion of the stent extended beyond the encroaching tumor.  The proximal portion stent extends just beyond the indwelling stent.  The scope could easily traverse the stent into the third portion the duodenum.   Except for the findings listed, the EGD was otherwise normal.  Retroflexion was not performed.     The scope was then withdrawn from the patient and the procedure completed.  COMPLICATIONS: There were no immediate complications.  ENDOSCOPIC IMPRESSION: 1.   partial obstruction at the most distal  portion of the duodenal stent status postwe talked placement of new stent beyond the area of obstruction 2.   EGD was otherwise normal  RECOMMENDATIONS: upper GI series to demonstrate stent patency and then resume diet  REPEAT EXAM:  eSigned:  Inda Castle, MD 03/31/2015 1:15 PM    CC:

## 2015-04-25 ENCOUNTER — Other Ambulatory Visit: Payer: Self-pay | Admitting: *Deleted

## 2015-04-25 DIAGNOSIS — C189 Malignant neoplasm of colon, unspecified: Secondary | ICD-10-CM

## 2015-04-25 DIAGNOSIS — C799 Secondary malignant neoplasm of unspecified site: Principal | ICD-10-CM

## 2015-04-27 ENCOUNTER — Ambulatory Visit (HOSPITAL_BASED_OUTPATIENT_CLINIC_OR_DEPARTMENT_OTHER): Payer: Medicare Other | Admitting: Nurse Practitioner

## 2015-04-27 ENCOUNTER — Telehealth: Payer: Self-pay | Admitting: Oncology

## 2015-04-27 ENCOUNTER — Other Ambulatory Visit (HOSPITAL_BASED_OUTPATIENT_CLINIC_OR_DEPARTMENT_OTHER): Payer: Medicare Other

## 2015-04-27 ENCOUNTER — Ambulatory Visit: Payer: Medicare Other

## 2015-04-27 VITALS — BP 109/59 | HR 83 | Temp 97.7°F | Resp 18 | Ht 67.0 in | Wt 132.7 lb

## 2015-04-27 DIAGNOSIS — C189 Malignant neoplasm of colon, unspecified: Secondary | ICD-10-CM

## 2015-04-27 DIAGNOSIS — C799 Secondary malignant neoplasm of unspecified site: Secondary | ICD-10-CM

## 2015-04-27 DIAGNOSIS — C787 Secondary malignant neoplasm of liver and intrahepatic bile duct: Secondary | ICD-10-CM | POA: Diagnosis not present

## 2015-04-27 DIAGNOSIS — R21 Rash and other nonspecific skin eruption: Secondary | ICD-10-CM

## 2015-04-27 DIAGNOSIS — C786 Secondary malignant neoplasm of retroperitoneum and peritoneum: Secondary | ICD-10-CM | POA: Diagnosis not present

## 2015-04-27 DIAGNOSIS — Z95828 Presence of other vascular implants and grafts: Secondary | ICD-10-CM

## 2015-04-27 DIAGNOSIS — R197 Diarrhea, unspecified: Secondary | ICD-10-CM

## 2015-04-27 DIAGNOSIS — C185 Malignant neoplasm of splenic flexure: Secondary | ICD-10-CM

## 2015-04-27 DIAGNOSIS — R109 Unspecified abdominal pain: Secondary | ICD-10-CM

## 2015-04-27 DIAGNOSIS — R112 Nausea with vomiting, unspecified: Secondary | ICD-10-CM

## 2015-04-27 DIAGNOSIS — E119 Type 2 diabetes mellitus without complications: Secondary | ICD-10-CM

## 2015-04-27 LAB — CBC WITH DIFFERENTIAL/PLATELET
BASO%: 0.7 % (ref 0.0–2.0)
BASOS ABS: 0.1 10*3/uL (ref 0.0–0.1)
EOS%: 4.2 % (ref 0.0–7.0)
Eosinophils Absolute: 0.5 10*3/uL (ref 0.0–0.5)
HCT: 27.1 % — ABNORMAL LOW (ref 38.4–49.9)
HGB: 8.9 g/dL — ABNORMAL LOW (ref 13.0–17.1)
LYMPH%: 6 % — ABNORMAL LOW (ref 14.0–49.0)
MCH: 27 pg — AB (ref 27.2–33.4)
MCHC: 32.7 g/dL (ref 32.0–36.0)
MCV: 82.6 fL (ref 79.3–98.0)
MONO#: 0.6 10*3/uL (ref 0.1–0.9)
MONO%: 5.8 % (ref 0.0–14.0)
NEUT#: 8.9 10*3/uL — ABNORMAL HIGH (ref 1.5–6.5)
NEUT%: 83.3 % — ABNORMAL HIGH (ref 39.0–75.0)
Platelets: 251 10*3/uL (ref 140–400)
RBC: 3.28 10*6/uL — AB (ref 4.20–5.82)
RDW: 20.1 % — AB (ref 11.0–14.6)
WBC: 10.7 10*3/uL — AB (ref 4.0–10.3)
lymph#: 0.6 10*3/uL — ABNORMAL LOW (ref 0.9–3.3)

## 2015-04-27 LAB — BASIC METABOLIC PANEL (CC13)
Anion Gap: 10 mEq/L (ref 3–11)
BUN: 17.7 mg/dL (ref 7.0–26.0)
CALCIUM: 9.5 mg/dL (ref 8.4–10.4)
CO2: 23 mEq/L (ref 22–29)
CREATININE: 1.1 mg/dL (ref 0.7–1.3)
Chloride: 106 mEq/L (ref 98–109)
EGFR: 64 mL/min/{1.73_m2} — AB (ref >90)
Glucose: 150 mg/dl — ABNORMAL HIGH (ref 70–140)
Potassium: 4.2 mEq/L (ref 3.5–5.1)
Sodium: 140 mEq/L (ref 136–145)

## 2015-04-27 MED ORDER — OXYCODONE-ACETAMINOPHEN 5-325 MG PO TABS: 1.0000 | ORAL_TABLET | Freq: Four times a day (QID) | ORAL | Status: DC | PRN

## 2015-04-27 MED ORDER — SODIUM CHLORIDE 0.9 % IJ SOLN
10.0000 mL | INTRAMUSCULAR | Status: DC | PRN
  Administered 2015-04-27: 10 mL via INTRAVENOUS
  Filled 2015-04-27: qty 10

## 2015-04-27 MED ORDER — HEPARIN SOD (PORK) LOCK FLUSH 100 UNIT/ML IV SOLN
500.0000 [IU] | Freq: Once | INTRAVENOUS | Status: AC
  Administered 2015-04-27: 500 [IU] via INTRAVENOUS
  Filled 2015-04-27: qty 5

## 2015-04-27 NOTE — Telephone Encounter (Signed)
Gave patient avs report and appointments for May  °

## 2015-04-27 NOTE — Progress Notes (Signed)
[facility] OFFICE PROGRESS NOTE   [name]:  Colon cancer  INTERVAL HISTORY:   [name] returns as scheduled. He has noted an increase in abdominal pain. The pain is typically controlled with one Percocet tablet every 6-7 hours. He occasionally takes 2 tablets or may need to take a dose at an earlier interval. He denies nausea/vomiting. No constipation or diarrhea.  Objective:  Vital signs in last 24 hours:  Blood pressure 109/59, pulse 83, temperature 97.7 F (36.5 C), temperature source Oral, resp. rate 18, height 5' 7"  (1.702 m), weight 132 lb 11.2 oz (60.192 kg), SpO2 98 %.    HEENT: No thrush or ulcers. Resp: Lungs clear bilaterally. Cardio: Regular rate and rhythm. GI: No hepatomegaly. Firm mass superior to the umbilicus. Vascular: No leg edema. Port-A-Cath without erythema.    Lab Results:  Lab Results  Component Value Date   WBC 10.7* 04/27/2015   HGB 8.9* 04/27/2015   HCT 27.1* 04/27/2015   MCV 82.6 04/27/2015   PLT 251 04/27/2015   NEUTROABS 8.9* 04/27/2015    Imaging:  No results found.  Medications: I have reviewed the patient's current medications.  Assessment/Plan: 1. 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  Initially diagnosed with stage III colon cancer July 2012 status post left colectomy 07/09/2011. Microsatellite stable   K-ras mutation not detected on initial testing. Extended testing returned negative for the K-ras mutation.   Adjuvant Xeloda initiated July 2012.   Rise in the CEA tumor marker and 2 intra-abdominal soft tissue densities on CT scan November 2012 while on adjuvant Xeloda chemotherapy.   Oxaliplatin and Avastin were added to the regimen beginning 11/26/2011 with a decrease in the CEA and continued through 04/23/2012. Oxaliplatin discontinued at that time due to cumulative neurotoxicity.   Xeloda and Avastin continued.   Restaging CT evaluation 04/09/2013 showed an increase in the  previously noted peritoneal implants.   FOLFIRI initiated 04/29/2013. Irinotecan dose reduced following cycle 1 due to mucositis.   Avastin added beginning with cycle 3.   Restaging CT evaluation 07/27/2013 (after 6 cycles) showed interval resolution of a previously described left lower lobe nodule. Interval improvement in peritoneal nodularity. He completed a total of 14 cycles through 11/23/2013. Treatment was adjusted to [facility] following an office visit 12/07/2013 due to a significant decline in his performance status.   Avastin placed on hold beginning 03/31/2014 due to increased urine protein. 24-hour urine on 04/02/2014 showed 814 mg of protein.   03/31/2014 CEA mildly increased at 20.3 as compared to 15.6 on 03/03/2014 and 10.4 on 02/03/2014.   Xeloda placed on hold beginning 04/14/2014 pending upcoming CT scans.   CT scan chest/abdomen/pelvis 04/29/2014 showed an enlarging stomach mass involving the anterior wall in the antral region measuring approximately 5 cm. The mass was narrowing the gastric lumen. Peritoneal implants in the left upper quadrant were noted to be slightly more enlarged.   Upper endoscopy 05/04/2014 with findings of a 5 x 5 cm mass in the gastric antrum. Biopsy showed metastatic adenocarcinoma.   Initiation of gastric radiation and concurrent Xeloda 05/25/2014; completed 06/14/2014.   Restaging CT scans chest/abdomen/pelvis 07/27/2014 with mild increase in the size of several small abdominal peritoneal metastases and mild decrease in size of the soft tissue mass involving the anterior wall of the gastric antrum. No new sites of metastatic disease identified. No evidence of metastatic disease involving the chest. Moderate to severe emphysema.   Cycle 1 irinotecan/panitumumab 08/04/2014   Cycle 2 irinotecan/Panitumumab 08/18/2014.  Cycle 3 irinotecan/panitumumab 09/01/2014.   CEA improved 09/15/2014.   Treatment held 09/15/2014 due to  diarrhea.   Cycle 4 irinotecan/Panitumumab 09/22/2014 (irinotecan dose reduced due to diarrhea).  Cycle 5 irinotecan/Panitumumab 10/06/2014.  Cycle 6 irinotecan/panitumumab 10/27/2014  Restaging CT 11/09/2014 with enlargement of a liver metastasis, increased gastric antral thickening, and slight progression of peritoneal metastases  CEA increased 11/09/2014  Cycle 1 FOLFOX/Avastin 01/12/2015  Cycle 2 FOLFOX 01/26/2015 (Avastin held due to an upcoming dental procedure)  Cycle 3 FOLFOX 02/09/2015 (Avastin held secondary to a planned dental procedure)  Cycle 4 FOLFOX 02/23/2015 (Avastin held due to a recent dental procedure) 2. Status post Port-A-Cath placement 04/27/2013. 3. Status post resection of a stage I non-small cell lung cancer left upper lung with postop brachytherapy June 2007. 4. 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 Radiographic complete regression on Avastin based chemotherapy. 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 6. Type 2 diabetes. 7. History of nephrolithiasis. 8. Status post bilateral corneal transplants. 9. History of Increased urine protein on Avastin. 10. Intermittent upper abdominal pain secondary to gastric mass and abdominal masses 11. Delayed nausea and diarrhea following cycle 1 irinotecan/panitumumab-the anti-emetic premedication was adjusted with cycle 2. Nausea improved with cycle 2. Persistent diarrhea. 12. Rash secondary to Panitumumab. Improved 13. Weight loss. 14. Diarrhea most likely related to irinotecan. Irinotecan dose reduced beginning with cycle 4 09/22/2014. 15. Right foot drop-potentially related to spinal stenosis versus peripheral nerve compression in the setting of weight loss. He was scheduled to undergo nerve decompression. 16. Admission 12/31/2014 with gastric outlet  obstruction secondary to tumor-status post placement of a gastric/duodenal stents on 01/04/2015 and 01/06/2015. Placement of a duodenal stent 03/31/2015 17. Hospitalization 02/28/2015 with nausea, dehydration and renal failure. He required dialysis. Nephrology suspected acute kidney injury from oxaliplatin and volume depletion. He was discharged 03/10/2015.   Disposition: [name] appears unchanged. He reviewed the information given to him at the time of his last visit on Lonsurf. He is concerned about the cost as well as the risk/benefit ratio. As a first step we decided to submit a prescription to determine his cost. Once that information is available he would like to return for a visit for additional discussion.  We scheduled a return visit in 2 weeks. He was provided with a new Percocet prescription at today's visit.    Ned Card ANP/GNP-BC   04/27/2015  3:08 PM

## 2015-04-27 NOTE — Patient Instructions (Signed)

## 2015-04-29 ENCOUNTER — Other Ambulatory Visit: Payer: Self-pay | Admitting: *Deleted

## 2015-04-29 ENCOUNTER — Encounter: Payer: Self-pay | Admitting: Oncology

## 2015-04-29 MED ORDER — TRIFLURIDINE-TIPIRACIL 20-8.19 MG PO TABS: 60.0000 mg | ORAL_TABLET | Freq: Two times a day (BID) | ORAL | Status: DC

## 2015-04-29 MED ORDER — TRIFLURIDINE-TIPIRACIL 20-8.19 MG PO TABS: 60.0000 mg | ORAL_TABLET | Freq: Two times a day (BID) | ORAL | Status: AC

## 2015-04-29 NOTE — Progress Notes (Signed)
I faxed [employer] [phone] for Lonsurf. [name] will send scrip

## 2015-04-29 NOTE — Telephone Encounter (Signed)
Lonsurf prior auth forms taken to managed care. Rx sent electronically to [employer] per managed care recommendation.

## 2015-05-02 ENCOUNTER — Encounter: Payer: Self-pay | Admitting: Oncology

## 2015-05-02 NOTE — Progress Notes (Signed)
I placed patient support form for poss asst with lonsurf on desk of dr. Benay Spice.

## 2015-05-03 ENCOUNTER — Telehealth: Payer: Self-pay | Admitting: *Deleted

## 2015-05-03 ENCOUNTER — Encounter: Payer: Self-pay | Admitting: Oncology

## 2015-05-03 NOTE — Telephone Encounter (Signed)
TC from [place] @ [employer] regarding prior authorization. They need recent clinical notes (from this year) fax'd to them @ [phone]. Thank you!

## 2015-05-03 NOTE — Progress Notes (Signed)
Per [employer] they need notes form this year. I faxed [phone]

## 2015-05-05 ENCOUNTER — Encounter: Payer: Self-pay | Admitting: Oncology

## 2015-05-05 NOTE — Progress Notes (Signed)
I faxed form to [facility] oncology support  [phone]

## 2015-05-06 ENCOUNTER — Telehealth: Payer: Self-pay

## 2015-05-06 ENCOUNTER — Encounter: Payer: Self-pay | Admitting: Oncology

## 2015-05-06 NOTE — Telephone Encounter (Signed)
[facility] patient support pt enrollment form to [facility]

## 2015-05-06 NOTE — Progress Notes (Signed)
Per [facility] patient was approved for lonsurf 05/03/15-05/02/16,and I will send to medical records.

## 2015-05-10 ENCOUNTER — Ambulatory Visit (HOSPITAL_BASED_OUTPATIENT_CLINIC_OR_DEPARTMENT_OTHER): Payer: Medicare Other | Admitting: Oncology

## 2015-05-10 ENCOUNTER — Telehealth: Payer: Self-pay | Admitting: Oncology

## 2015-05-10 ENCOUNTER — Other Ambulatory Visit (HOSPITAL_BASED_OUTPATIENT_CLINIC_OR_DEPARTMENT_OTHER): Payer: Medicare Other

## 2015-05-10 VITALS — BP 107/59 | HR 96 | Temp 97.4°F | Resp 19 | Ht 67.0 in | Wt 130.4 lb

## 2015-05-10 DIAGNOSIS — G893 Neoplasm related pain (acute) (chronic): Secondary | ICD-10-CM | POA: Diagnosis not present

## 2015-05-10 DIAGNOSIS — C786 Secondary malignant neoplasm of retroperitoneum and peritoneum: Secondary | ICD-10-CM

## 2015-05-10 DIAGNOSIS — C185 Malignant neoplasm of splenic flexure: Secondary | ICD-10-CM | POA: Diagnosis not present

## 2015-05-10 DIAGNOSIS — C155 Malignant neoplasm of lower third of esophagus: Secondary | ICD-10-CM

## 2015-05-10 DIAGNOSIS — E119 Type 2 diabetes mellitus without complications: Secondary | ICD-10-CM

## 2015-05-10 DIAGNOSIS — C787 Secondary malignant neoplasm of liver and intrahepatic bile duct: Secondary | ICD-10-CM

## 2015-05-10 DIAGNOSIS — C189 Malignant neoplasm of colon, unspecified: Secondary | ICD-10-CM

## 2015-05-10 DIAGNOSIS — R197 Diarrhea, unspecified: Secondary | ICD-10-CM

## 2015-05-10 LAB — CBC WITH DIFFERENTIAL/PLATELET
BASO%: 0.5 % (ref 0.0–2.0)
Basophils Absolute: 0.1 10*3/uL (ref 0.0–0.1)
EOS%: 3.9 % (ref 0.0–7.0)
Eosinophils Absolute: 0.4 10*3/uL (ref 0.0–0.5)
HEMATOCRIT: 28.2 % — AB (ref 38.4–49.9)
HGB: 9 g/dL — ABNORMAL LOW (ref 13.0–17.1)
LYMPH%: 4.8 % — AB (ref 14.0–49.0)
MCH: 26.6 pg — AB (ref 27.2–33.4)
MCHC: 32.1 g/dL (ref 32.0–36.0)
MCV: 82.9 fL (ref 79.3–98.0)
MONO#: 0.6 10*3/uL (ref 0.1–0.9)
MONO%: 5.5 % (ref 0.0–14.0)
NEUT#: 9.6 10*3/uL — ABNORMAL HIGH (ref 1.5–6.5)
NEUT%: 85.3 % — AB (ref 39.0–75.0)
Platelets: 287 10*3/uL (ref 140–400)
RBC: 3.4 10*6/uL — AB (ref 4.20–5.82)
RDW: 18.3 % — ABNORMAL HIGH (ref 11.0–14.6)
WBC: 11.2 10*3/uL — ABNORMAL HIGH (ref 4.0–10.3)
lymph#: 0.5 10*3/uL — ABNORMAL LOW (ref 0.9–3.3)

## 2015-05-10 LAB — COMPREHENSIVE METABOLIC PANEL (CC13)
ALBUMIN: 2.8 g/dL — AB (ref 3.5–5.0)
ALK PHOS: 111 U/L (ref 40–150)
ALT: 9 U/L (ref 0–55)
AST: 12 U/L (ref 5–34)
Anion Gap: 10 mEq/L (ref 3–11)
BUN: 15.3 mg/dL (ref 7.0–26.0)
CALCIUM: 10.1 mg/dL (ref 8.4–10.4)
CO2: 26 mEq/L (ref 22–29)
Chloride: 105 mEq/L (ref 98–109)
Creatinine: 1.1 mg/dL (ref 0.7–1.3)
EGFR: 67 mL/min/{1.73_m2} — ABNORMAL LOW (ref >90)
Glucose: 147 mg/dl — ABNORMAL HIGH (ref 70–140)
Potassium: 3.9 mEq/L (ref 3.5–5.1)
Sodium: 141 mEq/L (ref 136–145)
Total Bilirubin: 0.28 mg/dL (ref 0.20–1.20)
Total Protein: 6.4 g/dL (ref 6.4–8.3)

## 2015-05-10 MED ORDER — MORPHINE SULFATE ER 15 MG PO TBCR: 15.0000 mg | EXTENDED_RELEASE_TABLET | Freq: Two times a day (BID) | ORAL | Status: DC

## 2015-05-10 NOTE — Progress Notes (Signed)
[place] OFFICE PROGRESS NOTE   Diagnosis: Colon cancer  INTERVAL HISTORY:   [name] returns as scheduled. He continues to have abdominal pain. No nausea. He takes oxycodone approximate 6 times per day for relief of pain. He sometimes takes 2 tablets. He has received information on Lonsurf. He has applied to the patient assistance program and is waiting to hear back.  Objective:  Vital signs in last 24 hours:  Blood pressure 107/59, pulse 96, temperature 97.4 F (36.3 C), temperature source Oral, resp. rate 19, height 5' 7"  (1.702 m), weight 130 lb 6.4 oz (59.149 kg), SpO2 99 %.    HEENT: Neck without mass Resp: Lungs clear bilaterally Cardio: Regular rate and rhythm GI: There is a mass in the supra umbilical area with associated tenderness, no hepatomegaly, no parasite he is Vascular: No leg edema  Skin: Residual of a panitumumab rash over the back   Portacath/PICC-without erythema  Lab Results:  Lab Results  Component Value Date   WBC 11.2* 05/10/2015   HGB 9.0* 05/10/2015   HCT 28.2* 05/10/2015   MCV 82.9 05/10/2015   PLT 287 05/10/2015   NEUTROABS 9.6* 05/10/2015   BUN 15.3, creatinine 1.1, potassium 3.9  Lab Results  Component Value Date   CEA 170.8* 04/08/2015    Imaging:  No results found.  Medications: I have reviewed the patient's current medications.  Assessment/Plan: 1. 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  Initially diagnosed with stage III colon cancer July 2012 status post left colectomy 07/09/2011. Microsatellite stable   K-ras mutation not detected on initial testing. Extended testing returned negative for the K-ras mutation.   Adjuvant Xeloda initiated July 2012.   Rise in the CEA tumor marker and 2 intra-abdominal soft tissue densities on CT scan November 2012 while on adjuvant Xeloda chemotherapy.   Oxaliplatin and Avastin were added to the regimen beginning 11/26/2011 with a decrease in the CEA  and continued through 04/23/2012. Oxaliplatin discontinued at that time due to cumulative neurotoxicity.   Xeloda and Avastin continued.   Restaging CT evaluation 04/09/2013 showed an increase in the previously noted peritoneal implants.   FOLFIRI initiated 04/29/2013. Irinotecan dose reduced following cycle 1 due to mucositis.   Avastin added beginning with cycle 3.   Restaging CT evaluation 07/27/2013 (after 6 cycles) showed interval resolution of a previously described left lower lobe nodule. Interval improvement in peritoneal nodularity. He completed a total of 14 cycles through 11/23/2013. Treatment was adjusted to [facility] following an office visit 12/07/2013 due to a significant decline in his performance status.   Avastin placed on hold beginning 03/31/2014 due to increased urine protein. 24-hour urine on 04/02/2014 showed 814 mg of protein.   03/31/2014 CEA mildly increased at 20.3 as compared to 15.6 on 03/03/2014 and 10.4 on 02/03/2014.   Xeloda placed on hold beginning 04/14/2014 pending upcoming CT scans.   CT scan chest/abdomen/pelvis 04/29/2014 showed an enlarging stomach mass involving the anterior wall in the antral region measuring approximately 5 cm. The mass was narrowing the gastric lumen. Peritoneal implants in the left upper quadrant were noted to be slightly more enlarged.   Upper endoscopy 05/04/2014 with findings of a 5 x 5 cm mass in the gastric antrum. Biopsy showed metastatic adenocarcinoma.   Initiation of gastric radiation and concurrent Xeloda 05/25/2014; completed 06/14/2014.   Restaging CT scans chest/abdomen/pelvis 07/27/2014 with mild increase in the size of several small abdominal peritoneal metastases and mild decrease in size of the soft tissue mass  involving the anterior wall of the gastric antrum. No new sites of metastatic disease identified. No evidence of metastatic disease involving the chest. Moderate to severe emphysema.    Cycle 1 irinotecan/panitumumab 08/04/2014   Cycle 2 irinotecan/Panitumumab 08/18/2014.   Cycle 3 irinotecan/panitumumab 09/01/2014.   CEA improved 09/15/2014.   Treatment held 09/15/2014 due to diarrhea.   Cycle 4 irinotecan/Panitumumab 09/22/2014 (irinotecan dose reduced due to diarrhea).  Cycle 5 irinotecan/Panitumumab 10/06/2014.  Cycle 6 irinotecan/panitumumab 10/27/2014  Restaging CT 11/09/2014 with enlargement of a liver metastasis, increased gastric antral thickening, and slight progression of peritoneal metastases  CEA increased 11/09/2014  Cycle 1 FOLFOX/Avastin 01/12/2015  Cycle 2 FOLFOX 01/26/2015 (Avastin held due to an upcoming dental procedure)  Cycle 3 FOLFOX 02/09/2015 (Avastin held secondary to a planned dental procedure)  Cycle 4 FOLFOX 02/23/2015 (Avastin held due to a recent dental procedure) 2. Status post Port-A-Cath placement 04/27/2013. 3. Status post resection of a stage I non-small cell lung cancer left upper lung with postop brachytherapy June 2007. 4. 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 Radiographic complete regression on Avastin based chemotherapy. 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 6. Type 2 diabetes. 7. History of nephrolithiasis. 8. Status post bilateral corneal transplants. 9. History of Increased urine protein on Avastin. 10. Intermittent upper abdominal pain secondary to gastric mass and abdominal masses 11. Delayed nausea and diarrhea following cycle 1 irinotecan/panitumumab-the anti-emetic premedication was adjusted with cycle 2. Nausea improved with cycle 2. Persistent diarrhea. 12. Rash secondary to Panitumumab. Improved 13. Weight loss. 14. Diarrhea most likely related to irinotecan. Irinotecan dose reduced beginning with cycle 4 09/22/2014. 15. Right foot  drop-potentially related to spinal stenosis versus peripheral nerve compression in the setting of weight loss. He was scheduled to undergo nerve decompression. 16. 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 Placement of a duodenal stent 03/31/2015 17. Hospitalization 02/28/2015 with nausea, dehydration and renal failure. He required dialysis. Nephrology suspected acute kidney injury from oxaliplatin and volume depletion. He was discharged 03/10/2015. Resolved. 18. Pain secondary to metastatic colon cancer   Disposition:  [name] continues to have abdominal pain. He is taking oxycodone frequently. We decided to add low-dose MS Contin. He will continue oxycodone for breakthrough pain. He plans to begin Lonsurf if it is affordable. He will let us know when he hears from the patient assistance program. We reviewed the potential toxicities associated with this agent.  Mr. Drapeau will return for an office visit and Port-A-Cath flush on 06/08/2015.  Betsy Coder, MD  05/10/2015  4:24 PM

## 2015-05-10 NOTE — Telephone Encounter (Signed)
per pof to sch pt appt-gave pt copy of sch °

## 2015-05-11 LAB — CEA: CEA: 243 ng/mL — ABNORMAL HIGH (ref 0.0–5.0)

## 2015-05-16 ENCOUNTER — Encounter: Payer: Self-pay | Admitting: Oncology

## 2015-05-16 NOTE — Progress Notes (Signed)
Per [facility] oncology the patient has been approved for [facility] 05/11/15-12/24/15. No cost out of pocket

## 2015-05-20 ENCOUNTER — Telehealth: Payer: Self-pay | Admitting: *Deleted

## 2015-05-20 NOTE — Telephone Encounter (Signed)
Returned call to pt: He will begin [place] on 05/23/15. Follow up on 6/15 as scheduled. He voiced understanding.

## 2015-05-20 NOTE — Telephone Encounter (Signed)
Patient was told to notify office once Lonsurf has been received and will start it Monday 05/23/15. Patient was also instructed that an office visit should be made after the start of Lonsurf. Patient appointment is set for 06/08/15. Is this appointment okay or should be sooner? Please advise. Message sent to MD Benay Spice and RN Lavella Lemons.

## 2015-05-20 NOTE — Telephone Encounter (Signed)
6/15 is ok

## 2015-05-24 ENCOUNTER — Other Ambulatory Visit: Payer: Self-pay | Admitting: Internal Medicine

## 2015-05-24 NOTE — Telephone Encounter (Signed)
Is this a Deatra Ina patient? Please look with me.

## 2015-06-08 ENCOUNTER — Ambulatory Visit (HOSPITAL_BASED_OUTPATIENT_CLINIC_OR_DEPARTMENT_OTHER): Payer: Medicare Other | Admitting: Nurse Practitioner

## 2015-06-08 ENCOUNTER — Ambulatory Visit: Payer: Medicare Other

## 2015-06-08 ENCOUNTER — Telehealth: Payer: Self-pay | Admitting: Oncology

## 2015-06-08 ENCOUNTER — Ambulatory Visit (HOSPITAL_COMMUNITY)
Admission: RE | Admit: 2015-06-08 | Discharge: 2015-06-08 | Disposition: A | Discharge status: Home / Self Care | Payer: Medicare Other | Source: Ambulatory Visit | Attending: Oncology | Admitting: Oncology

## 2015-06-08 ENCOUNTER — Other Ambulatory Visit (HOSPITAL_BASED_OUTPATIENT_CLINIC_OR_DEPARTMENT_OTHER): Payer: Medicare Other

## 2015-06-08 VITALS — BP 100/44 | HR 76 | Temp 97.8°F | Resp 18 | Ht 67.0 in | Wt 129.2 lb

## 2015-06-08 DIAGNOSIS — D649 Anemia, unspecified: Secondary | ICD-10-CM

## 2015-06-08 DIAGNOSIS — C786 Secondary malignant neoplasm of retroperitoneum and peritoneum: Secondary | ICD-10-CM | POA: Diagnosis not present

## 2015-06-08 DIAGNOSIS — G893 Neoplasm related pain (acute) (chronic): Secondary | ICD-10-CM

## 2015-06-08 DIAGNOSIS — C189 Malignant neoplasm of colon, unspecified: Secondary | ICD-10-CM

## 2015-06-08 DIAGNOSIS — C155 Malignant neoplasm of lower third of esophagus: Secondary | ICD-10-CM

## 2015-06-08 DIAGNOSIS — Z95828 Presence of other vascular implants and grafts: Secondary | ICD-10-CM

## 2015-06-08 DIAGNOSIS — R197 Diarrhea, unspecified: Secondary | ICD-10-CM

## 2015-06-08 DIAGNOSIS — C7989 Secondary malignant neoplasm of other specified sites: Secondary | ICD-10-CM | POA: Diagnosis not present

## 2015-06-08 DIAGNOSIS — E119 Type 2 diabetes mellitus without complications: Secondary | ICD-10-CM

## 2015-06-08 DIAGNOSIS — C787 Secondary malignant neoplasm of liver and intrahepatic bile duct: Secondary | ICD-10-CM | POA: Diagnosis not present

## 2015-06-08 LAB — CBC WITH DIFFERENTIAL/PLATELET
BASO%: 0.1 % (ref 0.0–2.0)
BASOS ABS: 0 10*3/uL (ref 0.0–0.1)
EOS%: 2.5 % (ref 0.0–7.0)
Eosinophils Absolute: 0.2 10*3/uL (ref 0.0–0.5)
HCT: 24.7 % — ABNORMAL LOW (ref 38.4–49.9)
HGB: 7.8 g/dL — ABNORMAL LOW (ref 13.0–17.1)
LYMPH%: 6.3 % — ABNORMAL LOW (ref 14.0–49.0)
MCH: 26.7 pg — AB (ref 27.2–33.4)
MCHC: 31.6 g/dL — ABNORMAL LOW (ref 32.0–36.0)
MCV: 84.6 fL (ref 79.3–98.0)
MONO#: 0.3 10*3/uL (ref 0.1–0.9)
MONO%: 3.9 % (ref 0.0–14.0)
NEUT#: 6 10*3/uL (ref 1.5–6.5)
NEUT%: 87.2 % — ABNORMAL HIGH (ref 39.0–75.0)
Platelets: 211 10*3/uL (ref 140–400)
RBC: 2.92 10*6/uL — AB (ref 4.20–5.82)
RDW: 15.8 % — AB (ref 11.0–14.6)
WBC: 6.9 10*3/uL (ref 4.0–10.3)
lymph#: 0.4 10*3/uL — ABNORMAL LOW (ref 0.9–3.3)

## 2015-06-08 LAB — COMPREHENSIVE METABOLIC PANEL (CC13)
ALT: 8 U/L (ref 0–55)
ANION GAP: 9 meq/L (ref 3–11)
AST: 12 U/L (ref 5–34)
Albumin: 2.5 g/dL — ABNORMAL LOW (ref 3.5–5.0)
Alkaline Phosphatase: 192 U/L — ABNORMAL HIGH (ref 40–150)
BILIRUBIN TOTAL: 0.49 mg/dL (ref 0.20–1.20)
BUN: 19.7 mg/dL (ref 7.0–26.0)
CO2: 25 meq/L (ref 22–29)
Calcium: 9.4 mg/dL (ref 8.4–10.4)
Chloride: 105 mEq/L (ref 98–109)
Creatinine: 1.2 mg/dL (ref 0.7–1.3)
EGFR: 62 mL/min/{1.73_m2} — ABNORMAL LOW (ref >90)
GLUCOSE: 158 mg/dL — AB (ref 70–140)
Potassium: 3.9 mEq/L (ref 3.5–5.1)
Sodium: 139 mEq/L (ref 136–145)
Total Protein: 5.9 g/dL — ABNORMAL LOW (ref 6.4–8.3)

## 2015-06-08 LAB — PREPARE RBC (CROSSMATCH)

## 2015-06-08 MED ORDER — OXYCODONE-ACETAMINOPHEN 5-325 MG PO TABS: 1.0000 | ORAL_TABLET | Freq: Four times a day (QID) | ORAL | Status: DC | PRN

## 2015-06-08 MED ORDER — SODIUM CHLORIDE 0.9 % IJ SOLN
10.0000 mL | INTRAMUSCULAR | Status: DC | PRN
  Administered 2015-06-08: 10 mL via INTRAVENOUS
  Filled 2015-06-08: qty 10

## 2015-06-08 MED ORDER — HEPARIN SOD (PORK) LOCK FLUSH 100 UNIT/ML IV SOLN
500.0000 [IU] | Freq: Once | INTRAVENOUS | Status: AC
Start: 2015-06-08 — End: 2015-06-08
  Administered 2015-06-08: 500 [IU] via INTRAVENOUS
  Filled 2015-06-08: qty 5

## 2015-06-08 MED ORDER — MORPHINE SULFATE ER 15 MG PO TBCR: EXTENDED_RELEASE_TABLET | ORAL | Status: DC

## 2015-06-08 NOTE — Telephone Encounter (Signed)
Gave and printed appt sched and avs for pt for June and July  °

## 2015-06-08 NOTE — Progress Notes (Addendum)
[place] OFFICE PROGRESS NOTE   Diagnosis:  Colon cancer  INTERVAL HISTORY:   Mr. Lengacher returns as scheduled. He began cycle 1 Lonsurf on 05/23/2015. He denies nausea/vomiting. No mouth sores. No diarrhea. He has noted increased constipation. No rash. No bleeding. He denies shortness of breath and chest pain. He reports poor pain control. He is currently taking MS Contin 15 mg every 12 hours with Percocet as needed for breakthrough pain. He estimates taking Percocet 4-5 times a day. He notes that he is sleeping more.  Objective:  Vital signs in last 24 hours:  Blood pressure 100/44, pulse 76, temperature 97.8 F (36.6 C), temperature source Oral, resp. rate 18, height 5' 7"  (1.702 m), weight 129 lb 3.2 oz (58.605 kg), SpO2 97 %.    HEENT: No thrush or ulcers. Resp: Lungs clear bilaterally. Cardio: Regular rate and rhythm. GI: No hepatomegaly. Palpable mass supraumbilical region with associated tenderness. Vascular: Trace bilateral ankle edema. Calves soft and nontender. Port-A-Cath without erythema.    Lab Results:  Lab Results  Component Value Date   WBC 6.9 06/08/2015   HGB 7.8* 06/08/2015   HCT 24.7* 06/08/2015   MCV 84.6 06/08/2015   PLT 211 06/08/2015   NEUTROABS 6.0 06/08/2015    Imaging:  No results found.  Medications: I have reviewed the patient's current medications.  Assessment/Plan: 1. 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  Initially diagnosed with stage III colon cancer July 2012 status post left colectomy 07/09/2011. Microsatellite stable   K-ras mutation not detected on initial testing. Extended testing returned negative for the K-ras mutation.   Adjuvant Xeloda initiated July 2012.   Rise in the CEA tumor marker and 2 intra-abdominal soft tissue densities on CT scan November 2012 while on adjuvant Xeloda chemotherapy.   Oxaliplatin and Avastin were added to the regimen beginning 11/26/2011 with a decrease in  the CEA and continued through 04/23/2012. Oxaliplatin discontinued at that time due to cumulative neurotoxicity.   Xeloda and Avastin continued.   Restaging CT evaluation 04/09/2013 showed an increase in the previously noted peritoneal implants.   FOLFIRI initiated 04/29/2013. Irinotecan dose reduced following cycle 1 due to mucositis.   Avastin added beginning with cycle 3.   Restaging CT evaluation 07/27/2013 (after 6 cycles) showed interval resolution of a previously described left lower lobe nodule. Interval improvement in peritoneal nodularity. He completed a total of 14 cycles through 11/23/2013. Treatment was adjusted to [facility] following an office visit 12/07/2013 due to a significant decline in his performance status.   Avastin placed on hold beginning 03/31/2014 due to increased urine protein. 24-hour urine on 04/02/2014 showed 814 mg of protein.   03/31/2014 CEA mildly increased at 20.3 as compared to 15.6 on 03/03/2014 and 10.4 on 02/03/2014.   Xeloda placed on hold beginning 04/14/2014 pending upcoming CT scans.   CT scan chest/abdomen/pelvis 04/29/2014 showed an enlarging stomach mass involving the anterior wall in the antral region measuring approximately 5 cm. The mass was narrowing the gastric lumen. Peritoneal implants in the left upper quadrant were noted to be slightly more enlarged.   Upper endoscopy 05/04/2014 with findings of a 5 x 5 cm mass in the gastric antrum. Biopsy showed metastatic adenocarcinoma.   Initiation of gastric radiation and concurrent Xeloda 05/25/2014; completed 06/14/2014.   Restaging CT scans chest/abdomen/pelvis 07/27/2014 with mild increase in the size of several small abdominal peritoneal metastases and mild decrease in size of the soft tissue mass involving the anterior wall of the  gastric antrum. No new sites of metastatic disease identified. No evidence of metastatic disease involving the chest. Moderate to severe emphysema.    Cycle 1 irinotecan/panitumumab 08/04/2014   Cycle 2 irinotecan/Panitumumab 08/18/2014.   Cycle 3 irinotecan/panitumumab 09/01/2014.   CEA improved 09/15/2014.   Treatment held 09/15/2014 due to diarrhea.   Cycle 4 irinotecan/Panitumumab 09/22/2014 (irinotecan dose reduced due to diarrhea).  Cycle 5 irinotecan/Panitumumab 10/06/2014.  Cycle 6 irinotecan/panitumumab 10/27/2014  Restaging CT 11/09/2014 with enlargement of a liver metastasis, increased gastric antral thickening, and slight progression of peritoneal metastases  CEA increased 11/09/2014  Cycle 1 FOLFOX/Avastin 01/12/2015  Cycle 2 FOLFOX 01/26/2015 (Avastin held due to an upcoming dental procedure)  Cycle 3 FOLFOX 02/09/2015 (Avastin held secondary to a planned dental procedure)  Cycle 4 FOLFOX 02/23/2015 (Avastin held due to a recent dental procedure)  Cycle 1 Lonsurf 05/23/2015 2. Status post Port-A-Cath placement 04/27/2013. 3. Status post resection of a stage I non-small cell lung cancer left upper lung with postop brachytherapy June 2007. 4. 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 Radiographic complete regression on Avastin based chemotherapy. 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 6. Type 2 diabetes. 7. History of nephrolithiasis. 8. Status post bilateral corneal transplants. 9. History of Increased urine protein on Avastin. 10. Intermittent upper abdominal pain secondary to gastric mass and abdominal masses 11. Delayed nausea and diarrhea following cycle 1 irinotecan/panitumumab-the anti-emetic premedication was adjusted with cycle 2. Nausea improved with cycle 2. Persistent diarrhea. 12. Rash secondary to Panitumumab. Improved 13. Weight loss. 14. Diarrhea most likely related to irinotecan. Irinotecan dose reduced beginning with cycle  4 09/22/2014. 15. Right foot drop-potentially related to spinal stenosis versus peripheral nerve compression in the setting of weight loss. He was scheduled to undergo nerve decompression. 16. 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 Placement of a duodenal stent 03/31/2015 17. Hospitalization 02/28/2015 with nausea, dehydration and renal failure. He required dialysis. Nephrology suspected acute kidney injury from oxaliplatin and volume depletion. He was discharged 03/10/2015. Resolved. 18. Pain secondary to metastatic colon cancer   Disposition: Mr. Makar is completing the first cycle of Lonsurf. He will begin cycle 2 06/20/2015. He will return for a CBC on 06/20/2015 prior to beginning that cycle.  Pain is not well controlled. He will increase MS Contin to 30 mg every morning and 15 mg every evening and continue Percocet as needed.  He has symptomatic anemia. The anemia is multifactorial. We will arrange for a blood transfusion on 06/09/2015.  Mr. Zarcone will return for a follow-up visit in approximately one month. He will contact the office in the interim with any problems.  Patient seen with Dr. Benay Spice. 25 minutes were spent face-to-face at today's visit with the majority of that time involved in counseling/coordination of care.    Ned Card ANP/GNP-BC   06/08/2015  11:04 AM  This was a shared visit with Ned Card. Mr. Duffey was interviewed and examined.  He will be transfused with packed red blood cells. We adjusted the narcotic pain regimen. He appears to be tolerating the Lonsurf well.  He will begin a second cycle of Lonsurf on 06/20/2015.  Julieanne Manson, M.D.

## 2015-06-08 NOTE — Patient Instructions (Signed)

## 2015-06-09 ENCOUNTER — Ambulatory Visit (HOSPITAL_BASED_OUTPATIENT_CLINIC_OR_DEPARTMENT_OTHER): Payer: Medicare Other

## 2015-06-09 VITALS — BP 102/59 | HR 68 | Temp 98.5°F | Resp 16

## 2015-06-09 DIAGNOSIS — D649 Anemia, unspecified: Secondary | ICD-10-CM

## 2015-06-09 DIAGNOSIS — C189 Malignant neoplasm of colon, unspecified: Secondary | ICD-10-CM

## 2015-06-09 MED ORDER — SODIUM CHLORIDE 0.9 % IV SOLN
250.0000 mL | Freq: Once | INTRAVENOUS | Status: AC
  Administered 2015-06-09: 250 mL via INTRAVENOUS

## 2015-06-09 MED ORDER — HEPARIN SOD (PORK) LOCK FLUSH 100 UNIT/ML IV SOLN
500.0000 [IU] | Freq: Every day | INTRAVENOUS | Status: AC | PRN
  Administered 2015-06-09: 500 [IU]
  Filled 2015-06-09: qty 5

## 2015-06-09 MED ORDER — SODIUM CHLORIDE 0.9 % IJ SOLN
10.0000 mL | INTRAMUSCULAR | Status: AC | PRN
  Administered 2015-06-09: 10 mL
  Filled 2015-06-09: qty 10

## 2015-06-09 NOTE — Patient Instructions (Signed)

## 2015-06-10 LAB — TYPE AND SCREEN
ABO/RH(D): O POS
Antibody Screen: NEGATIVE
Unit division: 0
Unit division: 0

## 2015-06-20 ENCOUNTER — Telehealth: Payer: Self-pay | Admitting: *Deleted

## 2015-06-20 ENCOUNTER — Other Ambulatory Visit (HOSPITAL_BASED_OUTPATIENT_CLINIC_OR_DEPARTMENT_OTHER): Payer: Medicare Other

## 2015-06-20 ENCOUNTER — Other Ambulatory Visit: Payer: Self-pay

## 2015-06-20 DIAGNOSIS — D649 Anemia, unspecified: Secondary | ICD-10-CM

## 2015-06-20 DIAGNOSIS — C189 Malignant neoplasm of colon, unspecified: Secondary | ICD-10-CM

## 2015-06-20 LAB — CBC WITH DIFFERENTIAL/PLATELET
BASO%: 0.2 % (ref 0.0–2.0)
Basophils Absolute: 0 10*3/uL (ref 0.0–0.1)
EOS%: 2.5 % (ref 0.0–7.0)
Eosinophils Absolute: 0.1 10*3/uL (ref 0.0–0.5)
HCT: 31 % — ABNORMAL LOW (ref 38.4–49.9)
HGB: 9.7 g/dL — ABNORMAL LOW (ref 13.0–17.1)
LYMPH%: 12.7 % — ABNORMAL LOW (ref 14.0–49.0)
MCH: 26.6 pg — AB (ref 27.2–33.4)
MCHC: 31.3 g/dL — ABNORMAL LOW (ref 32.0–36.0)
MCV: 85.2 fL (ref 79.3–98.0)
MONO#: 0.8 10*3/uL (ref 0.1–0.9)
MONO%: 18.1 % — AB (ref 0.0–14.0)
NEUT#: 2.9 10*3/uL (ref 1.5–6.5)
NEUT%: 66.5 % (ref 39.0–75.0)
Platelets: 218 10*3/uL (ref 140–400)
RBC: 3.64 10*6/uL — AB (ref 4.20–5.82)
RDW: 16.3 % — AB (ref 11.0–14.6)
WBC: 4.3 10*3/uL (ref 4.0–10.3)
lymph#: 0.6 10*3/uL — ABNORMAL LOW (ref 0.9–3.3)

## 2015-06-20 NOTE — Telephone Encounter (Signed)
Pt had labs done today.  Dr. Benay Spice reviewed lab results.  Called pt at home and instructed pt to restart Lonsurf  3 tablets  BID  D 1-5 ,  8-12  Every 28 days cycle as per Dr. Benay Spice.  Pt voiced understanding.

## 2015-07-05 ENCOUNTER — Other Ambulatory Visit (HOSPITAL_BASED_OUTPATIENT_CLINIC_OR_DEPARTMENT_OTHER): Payer: Medicare Other

## 2015-07-05 ENCOUNTER — Ambulatory Visit (HOSPITAL_BASED_OUTPATIENT_CLINIC_OR_DEPARTMENT_OTHER): Payer: Medicare Other | Admitting: Oncology

## 2015-07-05 ENCOUNTER — Telehealth: Payer: Self-pay | Admitting: Oncology

## 2015-07-05 VITALS — BP 109/57 | HR 98 | Temp 97.8°F | Resp 18 | Ht 67.0 in | Wt 116.9 lb

## 2015-07-05 DIAGNOSIS — C189 Malignant neoplasm of colon, unspecified: Secondary | ICD-10-CM

## 2015-07-05 DIAGNOSIS — G893 Neoplasm related pain (acute) (chronic): Secondary | ICD-10-CM | POA: Diagnosis not present

## 2015-07-05 DIAGNOSIS — C155 Malignant neoplasm of lower third of esophagus: Secondary | ICD-10-CM | POA: Diagnosis not present

## 2015-07-05 DIAGNOSIS — R748 Abnormal levels of other serum enzymes: Secondary | ICD-10-CM

## 2015-07-05 DIAGNOSIS — C786 Secondary malignant neoplasm of retroperitoneum and peritoneum: Secondary | ICD-10-CM

## 2015-07-05 DIAGNOSIS — C787 Secondary malignant neoplasm of liver and intrahepatic bile duct: Secondary | ICD-10-CM

## 2015-07-05 DIAGNOSIS — E119 Type 2 diabetes mellitus without complications: Secondary | ICD-10-CM

## 2015-07-05 DIAGNOSIS — R109 Unspecified abdominal pain: Secondary | ICD-10-CM

## 2015-07-05 LAB — CBC WITH DIFFERENTIAL/PLATELET
BASO%: 0.1 % (ref 0.0–2.0)
Basophils Absolute: 0 10*3/uL (ref 0.0–0.1)
EOS ABS: 0 10*3/uL (ref 0.0–0.5)
EOS%: 0.4 % (ref 0.0–7.0)
HCT: 28.2 % — ABNORMAL LOW (ref 38.4–49.9)
HEMOGLOBIN: 8.9 g/dL — AB (ref 13.0–17.1)
LYMPH%: 4.6 % — ABNORMAL LOW (ref 14.0–49.0)
MCH: 25.6 pg — ABNORMAL LOW (ref 27.2–33.4)
MCHC: 31.6 g/dL — AB (ref 32.0–36.0)
MCV: 81.3 fL (ref 79.3–98.0)
MONO#: 0.2 10*3/uL (ref 0.1–0.9)
MONO%: 2.6 % (ref 0.0–14.0)
NEUT#: 6.5 10*3/uL (ref 1.5–6.5)
NEUT%: 92.3 % — ABNORMAL HIGH (ref 39.0–75.0)
PLATELETS: 205 10*3/uL (ref 140–400)
RBC: 3.47 10*6/uL — ABNORMAL LOW (ref 4.20–5.82)
RDW: 17.3 % — AB (ref 11.0–14.6)
WBC: 7 10*3/uL (ref 4.0–10.3)
lymph#: 0.3 10*3/uL — ABNORMAL LOW (ref 0.9–3.3)

## 2015-07-05 LAB — COMPREHENSIVE METABOLIC PANEL (CC13)
ALBUMIN: 2.2 g/dL — AB (ref 3.5–5.0)
ALT: 80 U/L — ABNORMAL HIGH (ref 0–55)
ANION GAP: 7 meq/L (ref 3–11)
AST: 76 U/L — AB (ref 5–34)
Alkaline Phosphatase: 1429 U/L — ABNORMAL HIGH (ref 40–150)
BILIRUBIN TOTAL: 11.57 mg/dL — AB (ref 0.20–1.20)
BUN: 30.3 mg/dL — AB (ref 7.0–26.0)
CALCIUM: 11 mg/dL — AB (ref 8.4–10.4)
CO2: 28 mEq/L (ref 22–29)
CREATININE: 1.2 mg/dL (ref 0.7–1.3)
Chloride: 102 mEq/L (ref 98–109)
EGFR: 61 mL/min/{1.73_m2} — ABNORMAL LOW (ref >90)
GLUCOSE: 172 mg/dL — AB (ref 70–140)
POTASSIUM: 4.1 meq/L (ref 3.5–5.1)
SODIUM: 137 meq/L (ref 136–145)
TOTAL PROTEIN: 6.1 g/dL — AB (ref 6.4–8.3)

## 2015-07-05 LAB — HOLD TUBE, BLOOD BANK

## 2015-07-05 MED ORDER — MORPHINE SULFATE ER 30 MG PO TBCR: 30.0000 mg | EXTENDED_RELEASE_TABLET | Freq: Two times a day (BID) | ORAL | Status: DC

## 2015-07-05 MED ORDER — OXYCODONE-ACETAMINOPHEN 5-325 MG PO TABS: 1.0000 | ORAL_TABLET | Freq: Four times a day (QID) | ORAL | Status: DC | PRN

## 2015-07-05 NOTE — Progress Notes (Signed)
CMET and critical bilirubin reviewed by Dr. Benay Spice, discussed with pt during office visit.

## 2015-07-05 NOTE — Progress Notes (Signed)
[facility] OFFICE PROGRESS NOTE   Diagnosis: Colon cancer  INTERVAL HISTORY:   Mr. Diana returns as scheduled. He completed another cycle of Lonsurf. No diarrhea, rash, or mouth sores. He reports fatigue, anorexia, and weight loss. He has increased abdominal pain. He is not taking MS Contin consistently. He takes oxycodone frequently. He also reports constipation.  Objective:  Vital signs in last 24 hours:  Blood pressure 109/57, pulse 98, temperature 97.8 F (36.6 C), temperature source Oral, resp. rate 18, height 5' 7"  (1.702 m), weight 116 lb 14.4 oz (53.025 kg), SpO2 99 %.    HEENT: Scleral icterus, no thrush Resp: Lungs clear bilaterally Cardio: Regular rate and rhythm GI: No hepatomegaly, mid abdominal mass near the umbilicus Vascular: No leg edema  Portacath/PICC-without erythema  Lab Results:  Lab Results  Component Value Date   WBC 7.0 07/05/2015   HGB 8.9* 07/05/2015   HCT 28.2* 07/05/2015   MCV 81.3 07/05/2015   PLT 205 07/05/2015   NEUTROABS 6.5 07/05/2015   creatinine 1.2, calcium 11, albumen 2.2, alk phosphatase 1429, bilirubin 11.57    Lab Results  Component Value Date   CEA 243.0* 05/10/2015    Medications: I have reviewed the patient's current medications.  Assessment/Plan: 1. 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  Initially diagnosed with stage III colon cancer July 2012 status post left colectomy 07/09/2011. Microsatellite stable   K-ras mutation not detected on initial testing. Extended testing returned negative for the K-ras mutation.   Adjuvant Xeloda initiated July 2012.   Rise in the CEA tumor marker and 2 intra-abdominal soft tissue densities on CT scan November 2012 while on adjuvant Xeloda chemotherapy.   Oxaliplatin and Avastin were added to the regimen beginning 11/26/2011 with a decrease in the CEA and continued through 04/23/2012. Oxaliplatin discontinued at that time due to cumulative  neurotoxicity.   Xeloda and Avastin continued.   Restaging CT evaluation 04/09/2013 showed an increase in the previously noted peritoneal implants.   FOLFIRI initiated 04/29/2013. Irinotecan dose reduced following cycle 1 due to mucositis.   Avastin added beginning with cycle 3.   Restaging CT evaluation 07/27/2013 (after 6 cycles) showed interval resolution of a previously described left lower lobe nodule. Interval improvement in peritoneal nodularity. He completed a total of 14 cycles through 11/23/2013. Treatment was adjusted to [facility] following an office visit 12/07/2013 due to a significant decline in his performance status.   Avastin placed on hold beginning 03/31/2014 due to increased urine protein. 24-hour urine on 04/02/2014 showed 814 mg of protein.   03/31/2014 CEA mildly increased at 20.3 as compared to 15.6 on 03/03/2014 and 10.4 on 02/03/2014.   Xeloda placed on hold beginning 04/14/2014 pending upcoming CT scans.   CT scan chest/abdomen/pelvis 04/29/2014 showed an enlarging stomach mass involving the anterior wall in the antral region measuring approximately 5 cm. The mass was narrowing the gastric lumen. Peritoneal implants in the left upper quadrant were noted to be slightly more enlarged.   Upper endoscopy 05/04/2014 with findings of a 5 x 5 cm mass in the gastric antrum. Biopsy showed metastatic adenocarcinoma.   Initiation of gastric radiation and concurrent Xeloda 05/25/2014; completed 06/14/2014.   Restaging CT scans chest/abdomen/pelvis 07/27/2014 with mild increase in the size of several small abdominal peritoneal metastases and mild decrease in size of the soft tissue mass involving the anterior wall of the gastric antrum. No new sites of metastatic disease identified. No evidence of metastatic disease involving the chest. Moderate to  severe emphysema.   Cycle 1 irinotecan/panitumumab 08/04/2014   Cycle 2 irinotecan/Panitumumab 08/18/2014.    Cycle 3 irinotecan/panitumumab 09/01/2014.   CEA improved 09/15/2014.   Treatment held 09/15/2014 due to diarrhea.   Cycle 4 irinotecan/Panitumumab 09/22/2014 (irinotecan dose reduced due to diarrhea).  Cycle 5 irinotecan/Panitumumab 10/06/2014.  Cycle 6 irinotecan/panitumumab 10/27/2014  Restaging CT 11/09/2014 with enlargement of a liver metastasis, increased gastric antral thickening, and slight progression of peritoneal metastases  CEA increased 11/09/2014  Cycle 1 FOLFOX/Avastin 01/12/2015  Cycle 2 FOLFOX 01/26/2015 (Avastin held due to an upcoming dental procedure)  Cycle 3 FOLFOX 02/09/2015 (Avastin held secondary to a planned dental procedure)  Cycle 4 FOLFOX 02/23/2015 (Avastin held due to a recent dental procedure)  Cycle 1 Lonsurf 05/23/2015  Cycle 2 Lonsurf 06/20/2015 2. Status post Port-A-Cath placement 04/27/2013. 3. Status post resection of a stage I non-small cell lung cancer left upper lung with postop brachytherapy June 2007. 4. 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 Radiographic complete regression on Avastin based chemotherapy. 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 6. Type 2 diabetes. 7. History of nephrolithiasis. 8. Status post bilateral corneal transplants. 9. History of Increased urine protein on Avastin. 10. Intermittent upper abdominal pain secondary to gastric mass and abdominal masses 11. Delayed nausea and diarrhea following cycle 1 irinotecan/panitumumab-the anti-emetic premedication was adjusted with cycle 2. Nausea improved with cycle 2. Persistent diarrhea. 12. Rash secondary to Panitumumab. Improved 13. Weight loss. 14. Diarrhea most likely related to irinotecan. Irinotecan dose reduced beginning with cycle 4 09/22/2014. 15. Right foot drop-potentially related to spinal  stenosis versus peripheral nerve compression in the setting of weight loss. He was scheduled to undergo nerve decompression. 16. 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 Placement of a duodenal stent 03/31/2015 17. Hospitalization 02/28/2015 with nausea, dehydration and renal failure. He required dialysis. Nephrology suspected acute kidney injury from oxaliplatin and volume depletion. He was discharged 03/10/2015. Resolved. 18. Pain secondary to metastatic colon cancer 19. Hyperbilirubinemia and markedly elevated alkaline phosphatase-likely secondary to biliary obstruction by tumor 20. Hypercalcemia   Disposition:  [name] has completed 2 cycles of Lonsurf. His performance status has declined and he has increased pain. I recommended switching to a comfort/supportive care approach and enrolling in home Hospice care. He does not wish to enroll in Hospice at present.  He has a poor prognosis. My initial recommendation is not to pursue evaluation/treatment of the hyperbilirubinemia. I will discuss the case with Dr. Deatra Ina. Mr. Olivero does not appear symptomatic from the hypercalcemia present.  We increased the MS Contin to 30 mg twice daily and adjusted the bowel regimen. He will return for an office visit and further discussion in one week.  Betsy Coder, MD  07/05/2015  10:17 AM

## 2015-07-05 NOTE — Telephone Encounter (Signed)
Gave adn printed appt sched and ask for pt for July

## 2015-07-06 LAB — CEA: CEA: 410.2 ng/mL — ABNORMAL HIGH (ref 0.0–5.0)

## 2015-07-14 ENCOUNTER — Ambulatory Visit (HOSPITAL_BASED_OUTPATIENT_CLINIC_OR_DEPARTMENT_OTHER): Payer: Medicare Other | Admitting: Nurse Practitioner

## 2015-07-14 VITALS — BP 98/45 | HR 102 | Temp 97.7°F | Resp 17 | Ht 67.0 in | Wt 119.2 lb

## 2015-07-14 DIAGNOSIS — M7989 Other specified soft tissue disorders: Secondary | ICD-10-CM

## 2015-07-14 DIAGNOSIS — C787 Secondary malignant neoplasm of liver and intrahepatic bile duct: Secondary | ICD-10-CM

## 2015-07-14 DIAGNOSIS — E119 Type 2 diabetes mellitus without complications: Secondary | ICD-10-CM

## 2015-07-14 DIAGNOSIS — C786 Secondary malignant neoplasm of retroperitoneum and peritoneum: Secondary | ICD-10-CM

## 2015-07-14 DIAGNOSIS — G893 Neoplasm related pain (acute) (chronic): Secondary | ICD-10-CM

## 2015-07-14 DIAGNOSIS — C189 Malignant neoplasm of colon, unspecified: Secondary | ICD-10-CM

## 2015-07-14 DIAGNOSIS — C155 Malignant neoplasm of lower third of esophagus: Secondary | ICD-10-CM

## 2015-07-14 DIAGNOSIS — Z85118 Personal history of other malignant neoplasm of bronchus and lung: Secondary | ICD-10-CM

## 2015-07-14 MED ORDER — MORPHINE SULFATE ER 30 MG PO TBCR: 30.0000 mg | EXTENDED_RELEASE_TABLET | Freq: Two times a day (BID) | ORAL | Status: AC

## 2015-07-14 MED ORDER — OXYCODONE-ACETAMINOPHEN 5-325 MG PO TABS: 1.0000 | ORAL_TABLET | Freq: Four times a day (QID) | ORAL | Status: AC | PRN

## 2015-07-14 NOTE — Progress Notes (Signed)
Called and informed [facility] that a referral was sent over and would like for someone to come out tomorrow.  Per Elby Showers. Marcello Moores, NP.

## 2015-07-14 NOTE — Progress Notes (Addendum)
[facility] OFFICE PROGRESS NOTE   Diagnosis:  Colon cancer  INTERVAL HISTORY:   [name] returns as scheduled. He reports abdominal pain is well-controlled with a combination of MS Contin and oxycodone. His wife thinks the jaundice is better. He continues to note that his urine is dark. He is having some constipation. He is intermittently confused. He has leg swelling. Appetite is poor. No fever.  Objective:  Vital signs in last 24 hours:  Blood pressure 98/45, pulse 102, temperature 97.7 F (36.5 C), temperature source Oral, resp. rate 17, height 5' 7" (1.702 m), weight 119 lb 3.2 oz (54.069 kg), SpO2 97 %.    HEENT: Scleral icterus. No thrush. Resp: Lungs clear. Cardio: Regular rate and rhythm. GI: No hepatomegaly. Mid abdominal mass near the umbilicus. Vascular: Pitting lower leg edema bilaterally. Neuro: Alert. Mildly confused.  Skin: Jaundice. Port-A-Cath without erythema.    Lab Results:  Lab Results  Component Value Date   WBC 7.0 07/05/2015   HGB 8.9* 07/05/2015   HCT 28.2* 07/05/2015   MCV 81.3 07/05/2015   PLT 205 07/05/2015   NEUTROABS 6.5 07/05/2015    Imaging:  No results found.  Medications: I have reviewed the patient's current medications.  Assessment/Plan: 1. 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  Initially diagnosed with stage III colon cancer July 2012 status post left colectomy 07/09/2011. Microsatellite stable   K-ras mutation not detected on initial testing. Extended testing returned negative for the K-ras mutation.   Adjuvant Xeloda initiated July 2012.   Rise in the CEA tumor marker and 2 intra-abdominal soft tissue densities on CT scan November 2012 while on adjuvant Xeloda chemotherapy.   Oxaliplatin and Avastin were added to the regimen beginning 11/26/2011 with a decrease in the CEA and continued through 04/23/2012. Oxaliplatin discontinued at that time due to cumulative neurotoxicity.    Xeloda and Avastin continued.   Restaging CT evaluation 04/09/2013 showed an increase in the previously noted peritoneal implants.   FOLFIRI initiated 04/29/2013. Irinotecan dose reduced following cycle 1 due to mucositis.   Avastin added beginning with cycle 3.   Restaging CT evaluation 07/27/2013 (after 6 cycles) showed interval resolution of a previously described left lower lobe nodule. Interval improvement in peritoneal nodularity. He completed a total of 14 cycles through 11/23/2013. Treatment was adjusted to [facility] following an office visit 12/07/2013 due to a significant decline in his performance status.   Avastin placed on hold beginning 03/31/2014 due to increased urine protein. 24-hour urine on 04/02/2014 showed 814 mg of protein.   03/31/2014 CEA mildly increased at 20.3 as compared to 15.6 on 03/03/2014 and 10.4 on 02/03/2014.   Xeloda placed on hold beginning 04/14/2014 pending upcoming CT scans.   CT scan chest/abdomen/pelvis 04/29/2014 showed an enlarging stomach mass involving the anterior wall in the antral region measuring approximately 5 cm. The mass was narrowing the gastric lumen. Peritoneal implants in the left upper quadrant were noted to be slightly more enlarged.   Upper endoscopy 05/04/2014 with findings of a 5 x 5 cm mass in the gastric antrum. Biopsy showed metastatic adenocarcinoma.   Initiation of gastric radiation and concurrent Xeloda 05/25/2014; completed 06/14/2014.   Restaging CT scans chest/abdomen/pelvis 07/27/2014 with mild increase in the size of several small abdominal peritoneal metastases and mild decrease in size of the soft tissue mass involving the anterior wall of the gastric antrum. No new sites of metastatic disease identified. No evidence of metastatic disease involving the chest. Moderate to severe  emphysema.   Cycle 1 irinotecan/panitumumab 08/04/2014   Cycle 2 irinotecan/Panitumumab 08/18/2014.   Cycle 3  irinotecan/panitumumab 09/01/2014.   CEA improved 09/15/2014.   Treatment held 09/15/2014 due to diarrhea.   Cycle 4 irinotecan/Panitumumab 09/22/2014 (irinotecan dose reduced due to diarrhea).  Cycle 5 irinotecan/Panitumumab 10/06/2014.  Cycle 6 irinotecan/panitumumab 10/27/2014  Restaging CT 11/09/2014 with enlargement of a liver metastasis, increased gastric antral thickening, and slight progression of peritoneal metastases  CEA increased 11/09/2014  Cycle 1 FOLFOX/Avastin 01/12/2015  Cycle 2 FOLFOX 01/26/2015 (Avastin held due to an upcoming dental procedure)  Cycle 3 FOLFOX 02/09/2015 (Avastin held secondary to a planned dental procedure)  Cycle 4 FOLFOX 02/23/2015 (Avastin held due to a recent dental procedure)  Cycle 1 Lonsurf 05/23/2015  Cycle 2 Lonsurf 06/20/2015 2. Status post Port-A-Cath placement 04/27/2013. 3. Status post resection of a stage I non-small cell lung cancer left upper lung with postop brachytherapy June 2007. 4. 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 Radiographic complete regression on Avastin based chemotherapy. 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 6. Type 2 diabetes. 7. History of nephrolithiasis. 8. Status post bilateral corneal transplants. 9. History of Increased urine protein on Avastin. 10. Intermittent upper abdominal pain secondary to gastric mass and abdominal masses 11. Delayed nausea and diarrhea following cycle 1 irinotecan/panitumumab-the anti-emetic premedication was adjusted with cycle 2. Nausea improved with cycle 2. Persistent diarrhea. 12. Rash secondary to Panitumumab. Improved 13. Weight loss. 14. Diarrhea most likely related to irinotecan. Irinotecan dose reduced beginning with cycle 4 09/22/2014. 15. Right foot drop-potentially related to spinal stenosis  versus peripheral nerve compression in the setting of weight loss. He was scheduled to undergo nerve decompression. 16. 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 Placement of a duodenal stent 03/31/2015 17. Hospitalization 02/28/2015 with nausea, dehydration and renal failure. He required dialysis. Nephrology suspected acute kidney injury from oxaliplatin and volume depletion. He was discharged 03/10/2015. Resolved. 18. Pain secondary to metastatic colon cancer 19. Hyperbilirubinemia and markedly elevated alkaline phosphatase-likely secondary to biliary obstruction by tumor 20. Hypercalcemia     Disposition: [name] performance status continues to decline. We discussed enrollment in the [facility] hospice program. He and his wife are in agreement. We discussed CPR/ACLS. He will be placed on NO CODE BLUE status. We will schedule a return visit in 2-3 weeks.  Patient seen with Dr. Benay Spice. 25 minutes were spent face-to-face at today's visit with the majority of that time involved and counseling/coordination of care.  Ned Card ANP/GNP-BC   07/14/2015  3:26 PM  This was a shared visit with Ned Card.  [name] agrees to home Hospice care. He will be placed on a no CODE BLUE status. I discussed the case with the GI service. It would be difficult to place a biliary stent with the indwelling duodenal stents. I do not recommend a percutaneous biliary drain given his poor overall prognosis. He will contact us for pruritus or fever/chills.  Julieanne Manson, M.D.

## 2015-07-15 ENCOUNTER — Telehealth: Payer: Self-pay | Admitting: Oncology

## 2015-07-15 NOTE — Telephone Encounter (Signed)
s.w. pt and advised on Aug appt....ok and aware °

## 2015-07-20 ENCOUNTER — Telehealth: Payer: Self-pay | Admitting: *Deleted

## 2015-07-20 NOTE — Telephone Encounter (Signed)
Received voice message from Brandon Melnick, [place] earlier this am. Reports "pt confused but not agreeing to DNR at this time; pt having difficulty swallowing, has MS Contin and Percocet 5-325--could percocet be changed to something different? 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 Benay Spice; notified Abigail Butts, RN that MD made aware of DNR status and aware of sacral decubitus; please continue to talk with pt re: DNR.  Can change percocet to Oxy liquid Fast or Roxanol liquid per Hospice MD recommendation and hospice RN does not have to flush port; ; MD requesting hospice to also inform pt/wife about [employer].  Abigail Butts, RN verbalized understanding of all information.

## 2015-07-27 ENCOUNTER — Encounter: Payer: Self-pay | Admitting: Oncology

## 2015-07-27 NOTE — Progress Notes (Signed)
I called patient asst back to let them know patient no longer on lonsurf. They had left message to confirm

## 2015-07-28 ENCOUNTER — Telehealth: Payer: Self-pay | Admitting: *Deleted

## 2015-07-28 NOTE — Telephone Encounter (Signed)
Message from [place] with hospice reporting pt has declined rapidly. No longer eating, unable to swallow liquids safely. Increased temporal wasting. Up to bathroom only, [name] thinks this will end soon. Respirations were 8. Hospice is providing daily support at this time. Will make Dr. Benay Spice aware.

## 2015-08-01 ENCOUNTER — Telehealth: Payer: Self-pay | Admitting: *Deleted

## 2015-08-01 NOTE — Telephone Encounter (Signed)
Received message from [place], [place] office pt passed away 7:55pm  Aug 05, 2015 @ his home.  Dr. Benay Spice notified.

## 2015-08-02 ENCOUNTER — Ambulatory Visit: Payer: Medicare Other | Admitting: Nurse Practitioner

## 2015-08-25 DEATH — deceased

## 2016-12-29 ENCOUNTER — Other Ambulatory Visit: Payer: Self-pay | Admitting: Nurse Practitioner
